# Patient Record
Sex: Male | Born: 1965
Health system: Southern US, Community
[De-identification: ages and names within clinical notes are randomized; demographics above are authoritative.]

## PROBLEM LIST (undated history)

## (undated) DIAGNOSIS — E785 Hyperlipidemia, unspecified: Secondary | ICD-10-CM

## (undated) DIAGNOSIS — F909 Attention-deficit hyperactivity disorder, unspecified type: Secondary | ICD-10-CM

## (undated) DIAGNOSIS — K5790 Diverticulosis of intestine, part unspecified, without perforation or abscess without bleeding: Secondary | ICD-10-CM

## (undated) DIAGNOSIS — K5792 Diverticulitis of intestine, part unspecified, without perforation or abscess without bleeding: Secondary | ICD-10-CM

## (undated) DIAGNOSIS — K635 Polyp of colon: Secondary | ICD-10-CM

## (undated) DIAGNOSIS — F419 Anxiety disorder, unspecified: Secondary | ICD-10-CM

## (undated) DIAGNOSIS — I10 Essential (primary) hypertension: Secondary | ICD-10-CM

## (undated) DIAGNOSIS — Z86010 Personal history of colonic polyps: Secondary | ICD-10-CM

## (undated) DIAGNOSIS — K5732 Diverticulitis of large intestine without perforation or abscess without bleeding: Secondary | ICD-10-CM

## (undated) DIAGNOSIS — K5710 Diverticulosis of small intestine without perforation or abscess without bleeding: Secondary | ICD-10-CM

## (undated) DIAGNOSIS — E119 Type 2 diabetes mellitus without complications: Secondary | ICD-10-CM

## (undated) DIAGNOSIS — K219 Gastro-esophageal reflux disease without esophagitis: Secondary | ICD-10-CM

## (undated) DIAGNOSIS — K5712 Diverticulitis of small intestine without perforation or abscess without bleeding: Secondary | ICD-10-CM

## (undated) DIAGNOSIS — K5730 Diverticulosis of large intestine without perforation or abscess without bleeding: Secondary | ICD-10-CM

## (undated) DIAGNOSIS — F32A Depression, unspecified: Secondary | ICD-10-CM

## (undated) HISTORY — PX: COLON SURGERY: SHX602

## (undated) HISTORY — DX: Gastro-esophageal reflux disease without esophagitis: K21.9

## (undated) HISTORY — PX: EYE SURGERY: SHX253

## (undated) HISTORY — DX: Hyperlipidemia, unspecified: E78.5

## (undated) HISTORY — PX: COLONOSCOPY W/ POLYPECTOMY: SHX1380

## (undated) HISTORY — DX: Anxiety disorder, unspecified: F41.9

## (undated) HISTORY — PX: LASIK: SHX215

## (undated) HISTORY — DX: Polyp of colon: K63.5

## (undated) HISTORY — DX: Type 2 diabetes mellitus without complications: E11.9

## (undated) HISTORY — DX: Diverticulosis of intestine, part unspecified, without perforation or abscess without bleeding: K57.90

## (undated) HISTORY — DX: Diverticulitis of intestine, part unspecified, without perforation or abscess without bleeding: K57.92

## (undated) HISTORY — DX: Attention-deficit hyperactivity disorder, unspecified type: F90.9

## (undated) HISTORY — PX: TONSILLECTOMY: SUR1361

---

## 2000-03-11 DIAGNOSIS — K5790 Diverticulosis of intestine, part unspecified, without perforation or abscess without bleeding: Secondary | ICD-10-CM

## 2000-03-11 HISTORY — DX: Diverticulosis of intestine, part unspecified, without perforation or abscess without bleeding: K57.90

## 2000-03-16 ENCOUNTER — Emergency Department (HOSPITAL_COMMUNITY): Admission: EM | Admit: 2000-03-16 | Discharge: 2000-03-16 | Payer: Self-pay | Admitting: Emergency Medicine

## 2000-12-01 ENCOUNTER — Encounter (INDEPENDENT_AMBULATORY_CARE_PROVIDER_SITE_OTHER): Payer: Self-pay | Admitting: Specialist

## 2000-12-01 ENCOUNTER — Other Ambulatory Visit: Admission: RE | Admit: 2000-12-01 | Discharge: 2000-12-01 | Payer: Self-pay | Admitting: Gastroenterology

## 2002-06-17 ENCOUNTER — Emergency Department (HOSPITAL_COMMUNITY): Admission: EM | Admit: 2002-06-17 | Discharge: 2002-06-17 | Payer: Self-pay | Admitting: Emergency Medicine

## 2002-06-19 ENCOUNTER — Emergency Department (HOSPITAL_COMMUNITY): Admission: EM | Admit: 2002-06-19 | Discharge: 2002-06-19 | Payer: Self-pay | Admitting: Emergency Medicine

## 2003-10-01 ENCOUNTER — Emergency Department (HOSPITAL_COMMUNITY): Admission: EM | Admit: 2003-10-01 | Discharge: 2003-10-01 | Payer: Self-pay | Admitting: Emergency Medicine

## 2004-05-18 ENCOUNTER — Ambulatory Visit: Payer: Self-pay | Admitting: Internal Medicine

## 2004-07-24 ENCOUNTER — Ambulatory Visit: Payer: Self-pay | Admitting: Internal Medicine

## 2004-11-28 ENCOUNTER — Ambulatory Visit: Payer: Self-pay | Admitting: Internal Medicine

## 2005-08-09 ENCOUNTER — Ambulatory Visit: Payer: Self-pay | Admitting: Geriatric Medicine

## 2006-03-11 DIAGNOSIS — K635 Polyp of colon: Secondary | ICD-10-CM

## 2006-03-11 HISTORY — DX: Polyp of colon: K63.5

## 2006-09-17 ENCOUNTER — Telehealth (INDEPENDENT_AMBULATORY_CARE_PROVIDER_SITE_OTHER): Payer: Self-pay | Admitting: *Deleted

## 2006-09-19 ENCOUNTER — Ambulatory Visit: Payer: Self-pay | Admitting: Internal Medicine

## 2006-09-19 DIAGNOSIS — K219 Gastro-esophageal reflux disease without esophagitis: Secondary | ICD-10-CM | POA: Insufficient documentation

## 2006-09-25 ENCOUNTER — Encounter (INDEPENDENT_AMBULATORY_CARE_PROVIDER_SITE_OTHER): Payer: Self-pay | Admitting: *Deleted

## 2006-10-22 ENCOUNTER — Ambulatory Visit: Payer: Self-pay | Admitting: Gastroenterology

## 2006-11-27 ENCOUNTER — Ambulatory Visit: Payer: Self-pay | Admitting: Gastroenterology

## 2006-11-27 ENCOUNTER — Encounter: Payer: Self-pay | Admitting: Internal Medicine

## 2006-11-27 ENCOUNTER — Encounter: Payer: Self-pay | Admitting: Gastroenterology

## 2006-11-27 DIAGNOSIS — D126 Benign neoplasm of colon, unspecified: Secondary | ICD-10-CM

## 2006-11-27 DIAGNOSIS — K648 Other hemorrhoids: Secondary | ICD-10-CM | POA: Insufficient documentation

## 2007-03-10 ENCOUNTER — Telehealth (INDEPENDENT_AMBULATORY_CARE_PROVIDER_SITE_OTHER): Payer: Self-pay | Admitting: *Deleted

## 2007-04-28 ENCOUNTER — Telehealth (INDEPENDENT_AMBULATORY_CARE_PROVIDER_SITE_OTHER): Payer: Self-pay | Admitting: *Deleted

## 2007-06-12 DIAGNOSIS — K5730 Diverticulosis of large intestine without perforation or abscess without bleeding: Secondary | ICD-10-CM | POA: Insufficient documentation

## 2007-06-12 DIAGNOSIS — E785 Hyperlipidemia, unspecified: Secondary | ICD-10-CM

## 2007-06-12 DIAGNOSIS — I10 Essential (primary) hypertension: Secondary | ICD-10-CM

## 2007-07-10 ENCOUNTER — Telehealth (INDEPENDENT_AMBULATORY_CARE_PROVIDER_SITE_OTHER): Payer: Self-pay | Admitting: *Deleted

## 2007-08-13 ENCOUNTER — Telehealth: Payer: Self-pay | Admitting: Internal Medicine

## 2007-08-20 ENCOUNTER — Encounter (INDEPENDENT_AMBULATORY_CARE_PROVIDER_SITE_OTHER): Payer: Self-pay | Admitting: *Deleted

## 2007-10-19 ENCOUNTER — Telehealth (INDEPENDENT_AMBULATORY_CARE_PROVIDER_SITE_OTHER): Payer: Self-pay | Admitting: *Deleted

## 2007-11-20 ENCOUNTER — Ambulatory Visit: Payer: Self-pay | Admitting: Internal Medicine

## 2007-11-20 DIAGNOSIS — Z860100 Personal history of colon polyps, unspecified: Secondary | ICD-10-CM | POA: Insufficient documentation

## 2007-11-20 DIAGNOSIS — F988 Other specified behavioral and emotional disorders with onset usually occurring in childhood and adolescence: Secondary | ICD-10-CM

## 2007-11-20 HISTORY — DX: Personal history of colonic polyps: Z86.010

## 2007-11-20 HISTORY — DX: Personal history of colon polyps, unspecified: Z86.0100

## 2007-11-23 ENCOUNTER — Encounter (INDEPENDENT_AMBULATORY_CARE_PROVIDER_SITE_OTHER): Payer: Self-pay | Admitting: *Deleted

## 2007-11-24 ENCOUNTER — Telehealth (INDEPENDENT_AMBULATORY_CARE_PROVIDER_SITE_OTHER): Payer: Self-pay | Admitting: *Deleted

## 2007-11-25 ENCOUNTER — Ambulatory Visit (HOSPITAL_COMMUNITY): Payer: Self-pay | Admitting: Psychiatry

## 2007-12-18 ENCOUNTER — Ambulatory Visit (HOSPITAL_COMMUNITY): Payer: Self-pay | Admitting: Psychiatry

## 2008-01-15 ENCOUNTER — Ambulatory Visit (HOSPITAL_COMMUNITY): Payer: Self-pay | Admitting: Psychiatry

## 2008-02-01 ENCOUNTER — Ambulatory Visit (HOSPITAL_COMMUNITY): Payer: Self-pay | Admitting: Licensed Clinical Social Worker

## 2008-02-12 ENCOUNTER — Ambulatory Visit (HOSPITAL_COMMUNITY): Payer: Self-pay | Admitting: Psychiatry

## 2008-02-29 ENCOUNTER — Telehealth (INDEPENDENT_AMBULATORY_CARE_PROVIDER_SITE_OTHER): Payer: Self-pay | Admitting: *Deleted

## 2008-03-15 ENCOUNTER — Ambulatory Visit (HOSPITAL_COMMUNITY): Payer: Self-pay | Admitting: Licensed Clinical Social Worker

## 2008-04-11 ENCOUNTER — Ambulatory Visit (HOSPITAL_COMMUNITY): Payer: Self-pay | Admitting: Psychiatry

## 2008-05-13 ENCOUNTER — Telehealth (INDEPENDENT_AMBULATORY_CARE_PROVIDER_SITE_OTHER): Payer: Self-pay | Admitting: *Deleted

## 2008-05-17 ENCOUNTER — Ambulatory Visit: Payer: Self-pay | Admitting: Internal Medicine

## 2008-05-26 ENCOUNTER — Encounter (INDEPENDENT_AMBULATORY_CARE_PROVIDER_SITE_OTHER): Payer: Self-pay | Admitting: *Deleted

## 2008-06-02 ENCOUNTER — Ambulatory Visit: Payer: Self-pay | Admitting: Internal Medicine

## 2008-06-02 LAB — CONVERTED CEMR LAB: HDL goal, serum: 40 mg/dL

## 2008-06-03 ENCOUNTER — Telehealth (INDEPENDENT_AMBULATORY_CARE_PROVIDER_SITE_OTHER): Payer: Self-pay | Admitting: *Deleted

## 2008-06-17 ENCOUNTER — Ambulatory Visit (HOSPITAL_COMMUNITY): Payer: Self-pay | Admitting: Psychiatry

## 2008-06-23 ENCOUNTER — Telehealth: Payer: Self-pay | Admitting: Internal Medicine

## 2008-06-24 ENCOUNTER — Ambulatory Visit: Payer: Self-pay | Admitting: Internal Medicine

## 2008-06-24 ENCOUNTER — Telehealth: Payer: Self-pay | Admitting: Internal Medicine

## 2008-06-24 DIAGNOSIS — K5732 Diverticulitis of large intestine without perforation or abscess without bleeding: Secondary | ICD-10-CM

## 2008-06-24 HISTORY — DX: Diverticulitis of large intestine without perforation or abscess without bleeding: K57.32

## 2008-06-24 LAB — CONVERTED CEMR LAB
Bilirubin Urine: NEGATIVE
Blood in Urine, dipstick: NEGATIVE
Protein, U semiquant: NEGATIVE
Urobilinogen, UA: NEGATIVE
WBC Urine, dipstick: NEGATIVE

## 2008-06-26 LAB — CONVERTED CEMR LAB
Basophils Absolute: 0 10*3/uL (ref 0.0–0.1)
Basophils Relative: 0.2 % (ref 0.0–3.0)
Eosinophils Absolute: 0.1 10*3/uL (ref 0.0–0.7)
HCT: 43.6 % (ref 39.0–52.0)
Hemoglobin: 15.2 g/dL (ref 13.0–17.0)
Lymphocytes Relative: 26.9 % (ref 12.0–46.0)
MCV: 88.4 fL (ref 78.0–100.0)
Monocytes Absolute: 1.4 10*3/uL — ABNORMAL HIGH (ref 0.1–1.0)
Monocytes Relative: 10.8 % (ref 3.0–12.0)

## 2008-06-27 ENCOUNTER — Encounter (INDEPENDENT_AMBULATORY_CARE_PROVIDER_SITE_OTHER): Payer: Self-pay | Admitting: *Deleted

## 2008-08-26 ENCOUNTER — Ambulatory Visit (HOSPITAL_COMMUNITY): Payer: Self-pay | Admitting: Psychiatry

## 2008-09-19 ENCOUNTER — Ambulatory Visit: Payer: Self-pay | Admitting: Internal Medicine

## 2008-09-25 LAB — CONVERTED CEMR LAB
AST: 22 units/L (ref 0–37)
Albumin: 4.6 g/dL (ref 3.5–5.2)
Cholesterol: 237 mg/dL — ABNORMAL HIGH (ref 0–200)
Direct LDL: 182.7 mg/dL
Total Bilirubin: 0.7 mg/dL (ref 0.3–1.2)
Total Protein: 7.6 g/dL (ref 6.0–8.3)
VLDL: 33.4 mg/dL (ref 0.0–40.0)

## 2008-09-27 ENCOUNTER — Encounter (INDEPENDENT_AMBULATORY_CARE_PROVIDER_SITE_OTHER): Payer: Self-pay | Admitting: *Deleted

## 2008-10-12 ENCOUNTER — Ambulatory Visit: Payer: Self-pay | Admitting: Internal Medicine

## 2008-10-28 ENCOUNTER — Ambulatory Visit (HOSPITAL_COMMUNITY): Payer: Self-pay | Admitting: Psychiatry

## 2008-12-28 ENCOUNTER — Ambulatory Visit (HOSPITAL_COMMUNITY): Payer: Self-pay | Admitting: Psychiatry

## 2009-02-20 ENCOUNTER — Ambulatory Visit (HOSPITAL_COMMUNITY): Payer: Self-pay | Admitting: Psychiatry

## 2009-03-13 ENCOUNTER — Telehealth (INDEPENDENT_AMBULATORY_CARE_PROVIDER_SITE_OTHER): Payer: Self-pay | Admitting: *Deleted

## 2009-03-13 ENCOUNTER — Encounter (INDEPENDENT_AMBULATORY_CARE_PROVIDER_SITE_OTHER): Payer: Self-pay | Admitting: *Deleted

## 2009-03-24 ENCOUNTER — Telehealth (INDEPENDENT_AMBULATORY_CARE_PROVIDER_SITE_OTHER): Payer: Self-pay | Admitting: *Deleted

## 2009-04-12 ENCOUNTER — Ambulatory Visit: Payer: Self-pay | Admitting: Internal Medicine

## 2009-04-12 DIAGNOSIS — F411 Generalized anxiety disorder: Secondary | ICD-10-CM | POA: Insufficient documentation

## 2009-05-15 ENCOUNTER — Ambulatory Visit (HOSPITAL_COMMUNITY): Payer: Self-pay | Admitting: Psychiatry

## 2009-06-05 ENCOUNTER — Ambulatory Visit: Payer: Self-pay | Admitting: Internal Medicine

## 2009-06-05 DIAGNOSIS — J069 Acute upper respiratory infection, unspecified: Secondary | ICD-10-CM | POA: Insufficient documentation

## 2009-06-05 LAB — CONVERTED CEMR LAB: Rapid Strep: NEGATIVE

## 2009-08-04 ENCOUNTER — Telehealth (INDEPENDENT_AMBULATORY_CARE_PROVIDER_SITE_OTHER): Payer: Self-pay | Admitting: *Deleted

## 2009-08-14 ENCOUNTER — Ambulatory Visit (HOSPITAL_COMMUNITY): Payer: Self-pay | Admitting: Psychiatry

## 2009-11-20 ENCOUNTER — Ambulatory Visit (HOSPITAL_COMMUNITY): Payer: Self-pay | Admitting: Psychiatry

## 2010-02-12 ENCOUNTER — Ambulatory Visit (HOSPITAL_COMMUNITY): Payer: Self-pay | Admitting: Psychiatry

## 2010-04-08 LAB — CONVERTED CEMR LAB
ALT: 46 units/L (ref 0–53)
AST: 22 units/L (ref 0–37)
AST: 30 units/L (ref 0–37)
Albumin: 4.7 g/dL (ref 3.5–5.2)
Alkaline Phosphatase: 67 units/L (ref 39–117)
Alkaline Phosphatase: 86 units/L (ref 39–117)
BUN: 12 mg/dL (ref 6–23)
Basophils Absolute: 0 10*3/uL (ref 0.0–0.1)
Basophils Absolute: 0 10*3/uL (ref 0.0–0.1)
Basophils Relative: 0 % (ref 0.0–1.0)
Basophils Relative: 0.1 % (ref 0.0–3.0)
Basophils Relative: 0.1 % (ref 0.0–3.0)
Bilirubin Urine: NEGATIVE
Bilirubin, Direct: 0.1 mg/dL (ref 0.0–0.3)
Blood in Urine, dipstick: NEGATIVE
CO2: 28 meq/L (ref 19–32)
CO2: 30 meq/L (ref 19–32)
CO2: 30 meq/L (ref 19–32)
Calcium: 10.1 mg/dL (ref 8.4–10.5)
Chloride: 102 meq/L (ref 96–112)
Chloride: 102 meq/L (ref 96–112)
Chloride: 105 meq/L (ref 96–112)
Cholesterol: 191 mg/dL (ref 0–200)
Cholesterol: 225 mg/dL (ref 0–200)
Creatinine, Ser: 0.9 mg/dL (ref 0.4–1.5)
Creatinine, Ser: 1.1 mg/dL (ref 0.4–1.5)
Direct LDL: 146.6 mg/dL
Eosinophils Absolute: 0.2 10*3/uL (ref 0.0–0.7)
Eosinophils Absolute: 0.3 10*3/uL (ref 0.0–0.6)
GFR calc non Af Amer: 99 mL/min
Glucose, Bld: 88 mg/dL (ref 70–99)
Glucose, Bld: 88 mg/dL (ref 70–99)
Glucose, Urine, Semiquant: NEGATIVE
HCT: 47.4 % (ref 39.0–52.0)
HCT: 49 % (ref 39.0–52.0)
HDL: 35.3 mg/dL — ABNORMAL LOW (ref >39.0)
HDL: 41.8 mg/dL (ref >39.00)
Hemoglobin, Urine: NEGATIVE
Hemoglobin: 15.5 g/dL (ref 13.0–17.0)
Hemoglobin: 16.2 g/dL (ref 13.0–17.0)
Hgb A1c MFr Bld: 5.6 % (ref 4.6–6.0)
Hgb A1c MFr Bld: 5.9 % (ref 4.6–6.0)
LDL Goal: 100 mg/dL
Lymphocytes Relative: 36.1 % (ref 12.0–46.0)
Lymphs Abs: 3.1 10*3/uL (ref 0.7–4.0)
MCHC: 32.8 g/dL (ref 30.0–36.0)
MCHC: 34.9 g/dL (ref 30.0–36.0)
Monocytes Absolute: 0.8 10*3/uL — ABNORMAL HIGH (ref 0.2–0.7)
Monocytes Relative: 8.8 % (ref 3.0–12.0)
Neutro Abs: 3.8 10*3/uL (ref 1.4–7.7)
Neutro Abs: 5.1 10*3/uL (ref 1.4–7.7)
Neutrophils Relative %: 58.7 % (ref 43.0–77.0)
Nitrite: NEGATIVE
Nitrite: NEGATIVE
Platelets: 189 10*3/uL (ref 150–400)
Platelets: 203 10*3/uL (ref 150.0–400.0)
Protein, U semiquant: NEGATIVE
Protein, ur: NEGATIVE mg/dL
RBC: 5.26 M/uL (ref 4.22–5.81)
RDW: 12 % (ref 11.5–14.6)
Sodium: 141 meq/L (ref 135–145)
Specific Gravity, Urine: 1.02
TSH: 1.54 microintl units/mL (ref 0.35–5.50)
TSH: 1.85 microintl units/mL (ref 0.35–5.50)
TSH: 1.87 microintl units/mL (ref 0.35–5.50)
Total Bilirubin: 0.7 mg/dL (ref 0.3–1.2)
Total Bilirubin: 0.9 mg/dL (ref 0.3–1.2)
Total Bilirubin: 0.9 mg/dL (ref 0.3–1.2)
Total Bilirubin: 1.1 mg/dL (ref 0.3–1.2)
Total CHOL/HDL Ratio: 5
Total CHOL/HDL Ratio: 6.4
Total CHOL/HDL Ratio: 8.2
Total Protein: 7.3 g/dL (ref 6.0–8.3)
Total Protein: 7.4 g/dL (ref 6.0–8.3)
Total Protein: 7.4 g/dL (ref 6.0–8.3)
Total Protein: 8.4 g/dL — ABNORMAL HIGH (ref 6.0–8.3)
Triglycerides: 312 mg/dL (ref 0–149)
Urobilinogen, UA: 0.2 (ref 0.0–1.0)
VLDL: 62 mg/dL — ABNORMAL HIGH (ref 0–40)
WBC Urine, dipstick: NEGATIVE
WBC: 8.7 10*3/uL (ref 4.5–10.5)
WBC: 9.1 10*3/uL (ref 4.5–10.5)
WBC: 9.5 10*3/uL (ref 4.5–10.5)
pH: 6 (ref 5.0–8.0)
pH: 6.5

## 2010-04-10 NOTE — Progress Notes (Signed)
Summary: refill  Phone Note Refill Request Message from:  Fax from Pharmacy on Aug 04, 2009 10:14 AM  Refills Requested: Medication #1:  CRESTOR 20 MG TABS daily as directed  Medication #2:  AMLODIPINE BESYLATE 5 MG TABS 1 once daily  Medication #3:  ALTACE 10 MG  CAPS 1 by mouth qd  Medication #4:  WELLBUTRIN XL 300 MG  TB24 1 by mouth qd [facility] - fax [phone] -- ph [phone]   Method Requested: Fax to Mail Away Pharmacy Initial call taken by: Okey Regal [place],  Aug 04, 2009 10:39 AM    Prescriptions: AMLODIPINE BESYLATE 5 MG TABS (AMLODIPINE BESYLATE) 1 once daily  #90 x 3   Entered by:   Shonna Chock   Authorized by:   Marga Melnick MD   Signed by:   Shonna Chock on 08/04/2009   Method used:   Faxed to ...       [facility] (mail-order)             ,          Ph: [phone]       Fax: [phone]   RxID:   [id number] CRESTOR 20 MG TABS (ROSUVASTATIN CALCIUM) daily as directed  #90 x 0   Entered by:   Shonna Chock   Authorized by:   Marga Melnick MD   Signed by:   Shonna Chock on 08/04/2009   Method used:   Faxed to ...       [facility]* (mail-order)             ,          Ph: [phone]       Fax: [phone]   RxID:   [id number] ALTACE 10 MG  CAPS (RAMIPRIL) 1 by mouth qd  #90 x 3   Entered by:   Shonna Chock   Authorized by:   Marga Melnick MD   Signed by:   Shonna Chock on 08/04/2009   Method used:   Faxed to ...       [facility]* (mail-order)             ,          Ph: [phone]       Fax: [phone]   RxID:   [id number] WELLBUTRIN XL 300 MG  TB24 (BUPROPION HCL) 1 by mouth qd  #90 x 3   Entered by:   Shonna Chock   Authorized by:   Marga Melnick MD   Signed by:   Shonna Chock on 08/04/2009   Method used:   Faxed to ...       [facility]* (mail-order)             ,          Ph: [phone]       Fax: [phone]   RxID:   [id number]

## 2010-04-10 NOTE — Progress Notes (Signed)
Summary: REFILL  Phone Note Refill Request Message from:  Fax from Pharmacy on [facility] FAX [phone]  Refills Requested: Medication #1:  CRESTOR 20 MG TABS daily as directed. OMEPRAZOLE 20MG   Initial call taken by: Barb Merino,  March 13, 2009 11:52 AM    Prescriptions: PRILOSEC 20 MG  CPDR (OMEPRAZOLE) 1 by mouth qd  #90 x 2   Entered by:   Shonna Chock   Authorized by:   Marga Melnick MD   Signed by:   Shonna Chock on 03/13/2009   Method used:   Faxed to ...       [facility]* (mail-order)             ,          Ph: [phone]       Fax: [phone]   RxID:   [id number]

## 2010-04-10 NOTE — Assessment & Plan Note (Signed)
Summary: cpx/ns/kdc   Vital Signs:  Patient profile:   45 year old male Height:      72.5 inches Weight:      231.2 pounds BMI:     31.04 Temp:     98.6 degrees F oral Pulse rate:   85 / minute Resp:     14 per minute BP sitting:   148 / 80  (left arm) Cuff size:   large  Vitals Entered By: Shonna Chock (April 12, 2009 11:05 AM)  Comments REVIEWED MED LIST, PATIENT AGREED DOSE AND INSTRUCTION CORRECT  Crestor is 1 by mouth M/W/F Flu Vaccine Consent Questions     Do you have a history of severe allergic reactions to this vaccine? no    Any prior history of allergic reactions to egg and/or gelatin? no    Do you have a sensitivity to the preservative Thimersol? no    Do you have a past history of Guillan-Barre Syndrome? no    Do you currently have an acute febrile illness? no    Have you ever had a severe reaction to latex? no    Vaccine information given and explained to patient? yes    Are you currently pregnant? no    Lot Number:[id number]   Exp Date:09/07/2009   Site Given:Right Deltoid IM    History of Present Illness: Dakota Vang is here for a physical; he is asymptomatic.  Lipid Management History:      Positive NCEP/ATP III risk factors include HDL cholesterol less than 40 and hypertension.  Negative NCEP/ATP III risk factors include male age less than 105 years old, non-diabetic, no family history for ischemic heart disease, non-tobacco-user status, no ASHD (atherosclerotic heart disease), no prior stroke/TIA, no peripheral vascular disease, and no history of aortic aneurysm.     Preventive Screening-Counseling & Management  Caffeine-Diet-Exercise     Does Patient Exercise: no  Allergies: 1)  ! Pcn  Past History:  Past Medical History: G E R D (ICD-530.81) Colonic polyps, hx of Diverticulosis, colon Hyperlipidemia:NMR 2010 : LDL 154(3088/2974), TG 205, HDL 32 Diverticulitis, PMH  of, 2005 ADD, Anxiety, Dr Minta Balsam, [facility]  Past Surgical  History: Tonsillectomy Lasik bilaterally  2008 Colonoscopy 2002: tics & hemorrhoids; Colon polypectomy X2, last 2008, Dr Russella Dar, due 2013  Family History: Father: colon CA, lung  CA , & mesothelioma  Mother: CAD, CABG X8 & angio 3-4X;  died of CA Gallbladder Siblings: neg; MGM COAD; MGF MI in late 13s; PGF d MI @ 82  Social History: Occupation:Construction Production designer, theatre/television/film Married Alcohol use-yes, minimally Former Smoker: quit 2007 Regular exercise-no No diet  Review of Systems  The patient denies anorexia, fever, vision loss, decreased hearing, hoarseness, chest pain, syncope, dyspnea on exertion, peripheral edema, prolonged cough, headaches, hemoptysis, abdominal pain, melena, hematochezia, severe indigestion/heartburn, hematuria, suspicious skin lesions, depression, unusual weight change, abnormal bleeding, enlarged lymph nodes, and angioedema.         Weight down with decreased portions.  Physical Exam  General:  well-nourished,in no acute distress; alert,appropriate and cooperative throughout examination Head:  Normocephalic and atraumatic without obvious abnormalities. No apparent alopecia; goatee & moustache Eyes:  No corneal or conjunctival inflammation noted. Perrla. Funduscopic exam benign, without hemorrhages, exudates or papilledema.  Ears:  External ear exam shows no significant lesions or deformities.  Otoscopic examination reveals clear canals, tympanic membranes are intact bilaterally without bulging, retraction, inflammation or discharge. Hearing is grossly normal bilaterally. Nose:  External nasal examination shows no deformity or inflammation. Nasal mucosa  are pink and moist without lesions or exudates. Minimal septal dislocation Mouth:  Oral mucosa and oropharynx without lesions or exudates.  Teeth in good repair. Neck:  No deformities, masses, or tenderness noted. Chest Wall:  Prominent xiphoid Lungs:  Normal respiratory effort, chest expands symmetrically. Lungs are  clear to auscultation, no crackles or wheezes. Heart:  Normal rate and regular rhythm. S1 and S2 normal without gallop, murmur, click, rub . S4 Abdomen:  Bowel sounds positive,abdomen soft and non-tender without masses, organomegaly or hernias noted. Rectal:  No external abnormalities noted. Normal sphincter tone. No rectal masses or tenderness. Genitalia:  Testes bilaterally descended without nodularity, tenderness or masses. No scrotal masses or lesions. No penis lesions or urethral discharge. L varicocele.   Prostate:  Prostate gland firm and smooth, no enlargement, nodularity, tenderness, mass, asymmetry or induration. Msk:  No deformity or scoliosis noted of thoracic or lumbar spine.   Pulses:  R and L carotid,radial,dorsalis pedis and posterior tibial pulses are full and equal bilaterally Extremities:  No clubbing, cyanosis, edema, or deformity noted with normal full range of motion of all joints.   Neurologic:  alert & oriented X3 and DTRs symmetrical and normal except decreased L knee   Skin:  Intact without suspicious lesions or rashes Cervical Nodes:  No lymphadenopathy noted Axillary Nodes:  No palpable lymphadenopathy Inguinal Nodes:  No significant adenopathy Psych:  memory intact for recent and remote, normally interactive, and good eye contact.     Impression & Recommendations:  Problem # 1:  PREVENTIVE HEALTH CARE (ICD-V70.0)  Orders: EKG w/ Interpretation (93000) Venipuncture (40102) TLB-Lipid Panel (80061-LIPID) TLB-BMP (Basic Metabolic Panel-BMET) (80048-METABOL) TLB-CBC Platelet - w/Differential (85025-CBCD) TLB-Hepatic/Liver Function Pnl (80076-HEPATIC) TLB-TSH (Thyroid Stimulating Hormone) (84443-TSH)  Problem # 2:  HYPERLIPIDEMIA (ICD-272.4)  His updated medication list for this problem includes:    Crestor 20 Mg Tabs (Rosuvastatin calcium) .Marland Kitchen... Daily as directed  Orders: Venipuncture (72536) TLB-Lipid Panel (80061-LIPID)  Problem # 3:  COLONIC POLYPS,  HX OF (ICD-V12.72) due 2013  Problem # 4:  HYPERTENSION (ICD-401.9)  His updated medication list for this problem includes:    Altace 10 Mg Caps (Ramipril) .Marland Kitchen... 1 by mouth qd    Amlodipine Besylate 5 Mg Tabs (Amlodipine besylate) .Marland Kitchen... 1 once daily  Orders: EKG w/ Interpretation (93000) Venipuncture (64403)  Problem # 5:  ADD (ICD-314.00) as per Dr Minta Balsam  Problem # 6:  DIVERTICULOSIS, COLON (ICD-562.10)  Complete Medication List: 1)  Altace 10 Mg Caps (Ramipril) .Marland Kitchen.. 1 by mouth qd 2)  Prilosec 20 Mg Cpdr (Omeprazole) .Marland Kitchen.. 1 by mouth qd 3)  Wellbutrin Xl 300 Mg Tb24 (Bupropion hcl) .Marland Kitchen.. 1 by mouth qd 4)  Fluticasone Propionate 50 Mcg/act Susp (Fluticasone propionate) .Marland Kitchen.. 1-2 sprays each nostril daily-seasonal 5)  Amlodipine Besylate 5 Mg Tabs (Amlodipine besylate) .Marland Kitchen.. 1 once daily 6)  Lamictal 200 Mg Tabs (Lamotrigine) .Marland Kitchen.. 1 by mouth once daily 7)  Ritalin 10 Mg Tabs (Methylphenidate hcl) .Marland Kitchen.. 1 by mouth two times a day 8)  Crestor 20 Mg Tabs (Rosuvastatin calcium) .... Daily as directed  Other Orders: Admin 1st Vaccine (47425) Flu Vaccine 49yrs + (95638)  Lipid Assessment/Plan:      Based on NCEP/ATP III, the patient's risk factor category is "2 or more risk factors and a calculated 10 year CAD risk of > 20%".  The patient's lipid goals are as follows: Total cholesterol goal is 200; LDL cholesterol goal is 100; HDL cholesterol goal is 40; Triglyceride goal is 150.  His  LDL cholesterol goal has not been met.  Secondary causes for hyperlipidemia have been ruled out.  He has been counseled on adjunctive measures for lowering his cholesterol and has been provided with dietary instructions.    Patient Instructions: 1)  Check your Blood Pressure regularly. If it is above:140/90 ON AVERAGE( ideal is average < 135/85) you should make an appointment. Prescriptions: CRESTOR 20 MG TABS (ROSUVASTATIN CALCIUM) daily as directed  #90 x 1   Entered and Authorized by:   Marga Melnick MD    Signed by:   Marga Melnick MD on 04/12/2009   Method used:   Print then Give to Patient   RxID:   [id number] FLUTICASONE PROPIONATE 50 MCG/ACT  SUSP (FLUTICASONE PROPIONATE) 1-2 sprays each nostril daily-SEASONAL  #3 x 3   Entered and Authorized by:   Marga Melnick MD   Signed by:   Marga Melnick MD on 04/12/2009   Method used:   Print then Give to Patient   RxID:   [id number] AMLODIPINE BESYLATE 5 MG TABS (AMLODIPINE BESYLATE) 1 once daily  #90 x 3   Entered and Authorized by:   Marga Melnick MD   Signed by:   Marga Melnick MD on 04/12/2009   Method used:   Print then Give to Patient   RxID:   [id number] WELLBUTRIN XL 300 MG  TB24 (BUPROPION HCL) 1 by mouth qd  #90 x 3   Entered and Authorized by:   Marga Melnick MD   Signed by:   Marga Melnick MD on 04/12/2009   Method used:   Print then Give to Patient   RxID:   [id number] PRILOSEC 20 MG  CPDR (OMEPRAZOLE) 1 by mouth qd  #90 x 3   Entered and Authorized by:   Marga Melnick MD   Signed by:   Marga Melnick MD on 04/12/2009   Method used:   Print then Give to Patient   RxID:   [id number] ALTACE 10 MG  CAPS (RAMIPRIL) 1 by mouth qd  #90 x 3   Entered and Authorized by:   Marga Melnick MD   Signed by:   Marga Melnick MD on 04/12/2009   Method used:   Print then Give to Patient   RxID:   [id number]

## 2010-04-10 NOTE — Assessment & Plan Note (Signed)
Summary: STREP THROAT/KDC   Vital Signs:  Patient profile:   45 year old male Weight:      231 pounds Temp:     100.3 degrees F oral Pulse rate:   80 / minute Resp:     15 per minute BP sitting:   130 / 90  (left arm)  Vitals Entered By: Jeremy Johann CMA (June 05, 2009 12:37 PM) CC: sore throat Comments REVIEWED MED LIST, PATIENT AGREED DOSE AND INSTRUCTION CORRECT    CC:  sore throat.  History of Present Illness: Onset as ST on 06/02/2009; 2 of 3 sons tested + for  Strep last week. Cough with yellow sputum & yellow D/C X 2 days. Rx: Advil for temp to 101.1.  Allergies: 1)  ! Pcn  Review of Systems General:  Complains of chills, fever, and sweats. ENT:  Complains of nasal congestion, sinus pressure, and sore throat; No frontal headache or facial pain. Mild L earache. Resp:  Complains of cough and sputum productive; denies chest pain with inspiration, coughing up blood, shortness of breath, and wheezing.  Physical Exam  General:  in no acute distress; alert,appropriate and cooperative throughout examination Ears:  External ear exam shows no significant lesions or deformities.  Otoscopic examination reveals clear canals, tympanic membranes are intact bilaterally without bulging, retraction, inflammation or discharge. Hearing is grossly normal bilaterally. Nose:  External nasal examination shows no deformity or inflammation. Nasal mucosa are erythematous without lesions or exudates. Mouth:  Oral mucosa and oropharynx without lesions or exudates.  No pharyngeal erythema.   Lungs:  Normal respiratory effort, chest expands symmetrically. Lungs are clear to auscultation, no crackles or wheezes. Cervical Nodes:  No lymphadenopathy noted Axillary Nodes:  No palpable lymphadenopathy   Impression & Recommendations:  Problem # 1:  PHARYNGITIS-ACUTE (ICD-462)  Beta Strep neg  Orders: Rapid Strep ([address])  His updated medication list for this problem includes:    Azithromycin  250 Mg Tabs (Azithromycin) .Marland Kitchen... As per pack  Problem # 2:  BRONCHITIS-ACUTE (ICD-466.0)  His updated medication list for this problem includes:    Azithromycin 250 Mg Tabs (Azithromycin) .Marland Kitchen... As per pack    Benzonatate 200 Mg Caps (Benzonatate) .Marland Kitchen... 1 evey 6-8 hrs as needed for  cough  Problem # 3:  URI (ICD-465.9)  His updated medication list for this problem includes:    Benzonatate 200 Mg Caps (Benzonatate) .Marland Kitchen... 1 evey 6-8 hrs as needed for  cough  Complete Medication List: 1)  Altace 10 Mg Caps (Ramipril) .Marland Kitchen.. 1 by mouth qd 2)  Prilosec 20 Mg Cpdr (Omeprazole) .Marland Kitchen.. 1 by mouth qd 3)  Wellbutrin Xl 300 Mg Tb24 (Bupropion hcl) .Marland Kitchen.. 1 by mouth qd 4)  Fluticasone Propionate 50 Mcg/act Susp (Fluticasone propionate) .Marland Kitchen.. 1-2 sprays each nostril daily-seasonal 5)  Amlodipine Besylate 5 Mg Tabs (Amlodipine besylate) .Marland Kitchen.. 1 once daily 6)  Lamictal 200 Mg Tabs (Lamotrigine) .Marland Kitchen.. 1 by mouth once daily 7)  Ritalin 10 Mg Tabs (Methylphenidate hcl) .Marland Kitchen.. 1 by mouth two times a day 8)  Crestor 20 Mg Tabs (Rosuvastatin calcium) .... Daily as directed 9)  Azithromycin 250 Mg Tabs (Azithromycin) .... As per pack 10)  Benzonatate 200 Mg Caps (Benzonatate) .Marland Kitchen.. 1 evey 6-8 hrs as needed for  cough  Patient Instructions: 1)  Drink as much fluid as you can tolerate for the next few days. 2)  Take 650-1000mg  of Tylenol every 4-6 hours as needed for relief of pain or comfort of fever AVOID taking more than  4000mg   in a 24 hour period (can cause liver damage in higher doses) OR take 400-600mg  of Ibuprofen (Advil, Motrin) with food every 4-6 hours as needed for relief of pain or comfort of fever. Prescriptions: BENZONATATE 200 MG CAPS (BENZONATATE) 1 evey 6-8 hrs as needed for  cough  #21 x 0   Entered and Authorized by:   Marga Melnick MD   Signed by:   Marga Melnick MD on 06/05/2009   Method used:   Faxed to ...       [facility]  [phone]* (retail)       [address] [place], [place]  [address]       Ph: [phone] or [phone]       Fax: [phone]   RxID:   [id number] AZITHROMYCIN 250 MG TABS (AZITHROMYCIN) as per pack  #1 x 0   Entered and Authorized by:   Marga Melnick MD   Signed by:   Marga Melnick MD on 06/05/2009   Method used:   Faxed to ...       [facility]  [phone]* (retail)       [address] [place], [place]  [address]       Ph: [phone] or [phone]       Fax: [phone]   RxID:   [id number]   Laboratory Results   Date/Time Reported: June 05, 2009 12:50 PM   Other Tests  Rapid Strep: negative

## 2010-04-10 NOTE — Progress Notes (Signed)
Summary: Refill Request  Phone Note Refill Request Message from:  Patient  Refills Requested: Medication #1:  ALTACE 10 MG  CAPS 1 by mouth qd  Medication #2:  PRILOSEC 20 MG  CPDR 1 by mouth qd  Medication #3:  WELLBUTRIN XL 300 MG  TB24 1 by mouth qd  Medication #4:  FLUTICASONE PROPIONATE 50 MCG/ACT  SUSP 1-2 sprays each nostril daily-SEASONAL Amlodipine Crestor   Method Requested: Fax to [facility] Initial call taken by: Shonna Chock,  March 24, 2009 11:42 AM    Prescriptions: CRESTOR 20 MG TABS (ROSUVASTATIN CALCIUM) daily as directed  #90 x 0   Entered by:   Shonna Chock   Authorized by:   Marga Melnick MD   Signed by:   Shonna Chock on 03/24/2009   Method used:   Faxed to ...       [facility]* (mail-order)             ,          Ph: [phone]       Fax: [phone]   RxID:   [id number] AMLODIPINE BESYLATE 5 MG TABS (AMLODIPINE BESYLATE) 1 once daily  #90 x 0   Entered by:   Shonna Chock   Authorized by:   Marga Melnick MD   Signed by:   Shonna Chock on 03/24/2009   Method used:   Faxed to ...       [facility]* (mail-order)             ,          Ph: [phone]       Fax: [phone]   RxID:   [id number] FLUTICASONE PROPIONATE 50 MCG/ACT  SUSP (FLUTICASONE PROPIONATE) 1-2 sprays each nostril daily-SEASONAL  #3 x 0   Entered by:   Shonna Chock   Authorized by:   Marga Melnick MD   Signed by:   Shonna Chock on 03/24/2009   Method used:   Faxed to ...       [facility]* (mail-order)             ,          Ph: [phone]       Fax: [phone]   RxID:   [id number] WELLBUTRIN XL 300 MG  TB24 (BUPROPION HCL) 1 by mouth qd  #90 x 0   Entered by:   Shonna Chock   Authorized by:   Marga Melnick MD   Signed by:   Shonna Chock on 03/24/2009   Method used:   Faxed to ...       [facility]* (mail-order)             ,          Ph: [phone]       Fax: [phone]   RxID:   [id number] PRILOSEC 20 MG   CPDR (OMEPRAZOLE) 1 by mouth qd  #90 x 0   Entered by:   Shonna Chock   Authorized by:   Marga Melnick MD   Signed by:   Shonna Chock on 03/24/2009   Method used:   Faxed to ...       [facility]* (mail-order)             ,          Ph: [phone]       Fax: [phone]   RxID:   [id number] ALTACE 10 MG  CAPS (RAMIPRIL) 1 by mouth  qd  #90 x 0   Entered by:   Shonna Chock   Authorized by:   Marga Melnick MD   Signed by:   Shonna Chock on 03/24/2009   Method used:   Faxed to ...       [facility] (mail-order)             ,          Ph: [phone]       Fax: [phone]   RxID:   [id number]

## 2010-04-10 NOTE — Letter (Signed)
Summary: Primary Care Appointment Letter  [facility]  [address] [place], [place] [address]   Phone: [phone]  Fax: [phone]    03/13/2009 MRN: [id number]  Dakota Vang [address] [place], [place]  [address]  Dear Dakota Vang,   Your Primary Care Physician Marga Melnick MD has indicated that:    ___X___it is time to schedule an appointment-Appointment was due in 01/2009 based on instructions copied and pasted from last ov in 10/2008: Patient Instructions: 1)  Crestor 20 mg M,W & F; fasting labs after 10 weeks. Your LDL goal = < 100 ,ideally < 75. 2)  Please schedule a follow-up appointment in 3 months. 3)  Hepatic Panel prior to visit, ICD-9:995.20 4)  Lipid Panel prior to visit, ICD-9:272.2.    _______you missed your appointment on______ and need to call and          reschedule.    _______you need to have lab work done.    _______you need to schedule an appointment discuss lab or test results.    _______you need to call to reschedule your appointment that is                       scheduled on _________.     Please call our office as soon as possible. Our phone number is [phone]. Please press option 1. Our office is open 8a-12noon and 1p-5p, Monday through Friday.     Thank you,    [facility]

## 2010-04-17 ENCOUNTER — Telehealth (INDEPENDENT_AMBULATORY_CARE_PROVIDER_SITE_OTHER): Payer: Self-pay | Admitting: *Deleted

## 2010-04-23 ENCOUNTER — Encounter (INDEPENDENT_AMBULATORY_CARE_PROVIDER_SITE_OTHER): Payer: Self-pay | Admitting: *Deleted

## 2010-04-24 ENCOUNTER — Emergency Department (HOSPITAL_COMMUNITY)
Admission: EM | Admit: 2010-04-24 | Discharge: 2010-04-24 | Disposition: A | Discharge status: Home / Self Care | Payer: 59 | Attending: Emergency Medicine | Admitting: Emergency Medicine

## 2010-04-24 ENCOUNTER — Emergency Department (HOSPITAL_COMMUNITY): Payer: 59

## 2010-04-24 DIAGNOSIS — R110 Nausea: Secondary | ICD-10-CM | POA: Insufficient documentation

## 2010-04-24 DIAGNOSIS — E7800 Pure hypercholesterolemia, unspecified: Secondary | ICD-10-CM | POA: Insufficient documentation

## 2010-04-24 DIAGNOSIS — I10 Essential (primary) hypertension: Secondary | ICD-10-CM | POA: Insufficient documentation

## 2010-04-24 DIAGNOSIS — K5732 Diverticulitis of large intestine without perforation or abscess without bleeding: Secondary | ICD-10-CM | POA: Insufficient documentation

## 2010-04-24 DIAGNOSIS — R1032 Left lower quadrant pain: Secondary | ICD-10-CM | POA: Insufficient documentation

## 2010-04-24 LAB — CBC
HCT: 44.4 % (ref 39.0–52.0)
Hemoglobin: 16 g/dL (ref 13.0–17.0)
RDW: 12.5 % (ref 11.5–15.5)
WBC: 17.7 10*3/uL — ABNORMAL HIGH (ref 4.0–10.5)

## 2010-04-24 LAB — POCT I-STAT, CHEM 8
Calcium, Ion: 1.19 mmol/L (ref 1.12–1.32)
Creatinine, Ser: 1.2 mg/dL (ref 0.4–1.5)
HCT: 48 % (ref 39.0–52.0)
Potassium: 4.8 mEq/L (ref 3.5–5.1)

## 2010-04-24 LAB — URINALYSIS, ROUTINE W REFLEX MICROSCOPIC
Nitrite: NEGATIVE
Protein, ur: NEGATIVE mg/dL
Specific Gravity, Urine: 1.03 (ref 1.005–1.030)
Urine Glucose, Fasting: NEGATIVE mg/dL
pH: 5.5 (ref 5.0–8.0)

## 2010-04-24 LAB — OCCULT BLOOD, POC DEVICE: Fecal Occult Bld: NEGATIVE

## 2010-04-26 NOTE — Progress Notes (Signed)
Summary: REFILL  Phone Note Refill Request Call back at Work Phone [phone] Message from:  Patient on April 17, 2010 1:31 PM  Refills Requested: Medication #1:  WELLBUTRIN XL 300 MG  TB24 1 by mouth qd   Dosage confirmed as above?Dosage Confirmed   Supply Requested: 3 months [facility]  Initial call taken by: Lavell Islam,  April 17, 2010 1:31 PM    Prescriptions: WELLBUTRIN XL 300 MG  TB24 (BUPROPION HCL) 1 by mouth qd  #90 x 0   Entered by:   Shonna Chock CMA   Authorized by:   Marga Melnick MD   Signed by:   Shonna Chock CMA on 04/17/2010   Method used:   Faxed to ...       [facility] (mail-[facility])             , [place]         Ph: [phone]       Fax: [phone]   RxID:   [id number]

## 2010-05-02 NOTE — Letter (Signed)
Summary: Primary Care Appointment Letter  [facility] [place]  [address] [place], [place] [address]   Phone: [phone]  Fax: [phone]    04/23/2010 MRN: [id number]  Dakota Vang [address] [place], [place]  [address]  Dear Mr. Salceda,   Your Primary Care Physician Marga Melnick MD has indicated that:    ____X__it is time to schedule an appointment(Last physical was 04/12/09)Please call to schedule physical for 2012.    _______you missed your appointment on______ and need to call and          reschedule.    _______you need to have lab work done.    _______you need to schedule an appointment discuss lab or test results.    _______you need to call to reschedule your appointment that is                       scheduled on _________.     Please call our office as soon as possible. Our phone number is [phone]. Please press option 1. Our office is open 8a-5p, Monday through Friday.     Thank you,    [facility]

## 2010-05-09 ENCOUNTER — Encounter: Payer: Self-pay | Admitting: Internal Medicine

## 2010-05-16 ENCOUNTER — Encounter (HOSPITAL_COMMUNITY): Payer: 59 | Admitting: Psychiatry

## 2010-05-16 DIAGNOSIS — F39 Unspecified mood [affective] disorder: Secondary | ICD-10-CM

## 2010-06-22 ENCOUNTER — Encounter: Payer: Self-pay | Admitting: Internal Medicine

## 2010-06-22 ENCOUNTER — Ambulatory Visit (INDEPENDENT_AMBULATORY_CARE_PROVIDER_SITE_OTHER): Payer: 59 | Admitting: Internal Medicine

## 2010-06-22 VITALS — BP 136/98 | HR 76 | Temp 98.9°F | Resp 14 | Ht 73.0 in | Wt 233.4 lb

## 2010-06-22 DIAGNOSIS — E785 Hyperlipidemia, unspecified: Secondary | ICD-10-CM

## 2010-06-22 DIAGNOSIS — Z0000 Encounter for general adult medical examination without abnormal findings: Secondary | ICD-10-CM

## 2010-06-22 LAB — CBC WITH DIFFERENTIAL/PLATELET
Basophils Absolute: 0.1 10*3/uL (ref 0.0–0.1)
Eosinophils Absolute: 0.3 10*3/uL (ref 0.0–0.7)
Lymphocytes Relative: 37.5 % (ref 12.0–46.0)
MCHC: 34.8 g/dL (ref 30.0–36.0)
Neutro Abs: 4.6 10*3/uL (ref 1.4–7.7)
Neutrophils Relative %: 51.8 % (ref 43.0–77.0)
Platelets: 204 10*3/uL (ref 150.0–400.0)
RDW: 13.2 % (ref 11.5–14.6)

## 2010-06-22 LAB — COMPREHENSIVE METABOLIC PANEL
ALT: 33 U/L (ref 0–53)
AST: 21 U/L (ref 0–37)
Albumin: 4.7 g/dL (ref 3.5–5.2)
Calcium: 9.9 mg/dL (ref 8.4–10.5)
Chloride: 102 mEq/L (ref 96–112)
Potassium: 4.8 mEq/L (ref 3.5–5.1)

## 2010-06-22 NOTE — Progress Notes (Signed)
  Subjective:    Patient ID: Dakota Vang, male    DOB: [birth year], 45 y.o.   MRN: [id number]  HPI He is here for a physical; he has had 2-3 episodes of Diverticulitis. Approximately  1 month ago he was treated in ER . Magnesium citrate normally can interrupt the cycle usually.    Review of Systems Patient reports no  vision/ hearing changes,anorexia, fever ,adenopathy, persistant / recurrent hoarseness, swallowing issues, chest pain,palpitations, edema,persistant / recurrent cough, hemoptysis, dyspnea(rest, exertional, paroxysmal nocturnal), melena,  abdominal pain, excessive heart burn, GU symptoms( dysuria, hematuria, pyuria) syncope, focal weakness, memory loss,numbness & tingling, skin/hair/nail changes,depression, anxiety, abnormal bruising/bleeding, musculoskeletal symptoms/signs.   Weight will fluctuate 10 pounds.  He intermittently will have rectal bleeding from internal hemorrhoids. BP not checked @ home.     Objective:   Physical Exam Gen.:Healthy and well-nourished in appearance. Alert, appropriate and cooperative throughout exam. Head: Normocephalic without obvious abnormalities;no  alopecia.Goatee Eyes: No corneal or conjunctival inflammation noted. Pupils equal round reactive to light and accommodation. Fundal exam is benign without hemorrhages, exudate, papilledema. Extraocular motion intact. Vision grossly normal. Ears: External  ear exam reveals no significant lesions or deformities. Canals clear .TMs normal. Hearing is grossly normal bilaterally. Nose: External nasal exam reveals no deformity or inflammation. Nasal mucosa are pink and moist. No lesions or exudates noted. Septum  Slightly dislocated.  Mouth: Oral mucosa and oropharynx reveal no lesions or exudates. Teeth in good repair. Neck: No deformities, masses, or tenderness noted. Range of motion normal. Thyroid w/o nodules or enlargement. Lungs: Normal respiratory effort; chest expands symmetrically. Lungs are clear  to auscultation without rales, wheezes, or increased work of breathing. Heart: Normal rate and rhythm. Normal S1 and S2. No gallop, click, or rub. No murmur. S4 Abdomen: Bowel sounds normal; abdomen soft and nontender. No masses, organomegaly or hernias noted. Genitalia:  Done in ER last month[place]ocoele on L . DRE  Minimal asymmetry(L> R) w/o enlargement , induration or nodules . Musculoskeletal/extremities: No deformity or scoliosis noted of  the thoracic or lumbar spine. No clubbing, cyanosis, edema, or deformity noted. Range of motion  normal .Tone & strength  normal.Joints normal. Nail health  good. Vascular: Carotid, radial artery, dorsalis pedis and dorsalis posterior tibial pulses are full and equal. No bruits present. Neurologic: Alert and oriented x3. Deep tendon reflexes symmetrical and normal. Skin: Intact without suspicious lesions or rashes.  Lymph: No cervical, axillary, or inguinal lymphadenopathy present.  Psych: Mood and affect are normal. Normally interactive                                                                                        Assessment & Plan:   #1 wellness exam; no acute issues  #2 diverticulosis with intermittent diverticulitis  #3 dyslipidemia  #4 hypertension; questionable level of control  Plan: #1 advance cholesterol testing reassessment  #2 trial of Align , probiotic, to help prevent recurrent diverticulitis.

## 2010-06-22 NOTE — Patient Instructions (Addendum)
Use Align daily as trial.   Monitor blood pressure of 5-7 times a week. The goal is an average less than 140/90,ideally be less than 135/85.                                                                                                                                                              Preventive Health Care: Exercise at least 30-45 minutes a day,  3-4 days a week.  Eat a low-fat diet with lots of fruits and vegetables, up to 7-9 servings per day. Avoid obesity; your goal is waist measurement < 40 inches.Consume less than 40 grams of sugar per day from foods & drinks with High Fructose Corn Sugar as #2,3 or # 4 on label. Eye Doctor - have an eye exam @ least annually.                                                                                  Health Care Power of Attorney & Living Will. Complete if not in place ; these place you in charge of your health care decisions. Depression is common in our stressful world.If you're feeling down or losing interest in things you normally enjoy, please call .

## 2010-06-22 NOTE — Assessment & Plan Note (Signed)
NMR Liporofile 2005: LDL 215(2857/1804), HDL 26, TG 215. LDL goal = < 100.

## 2010-07-01 ENCOUNTER — Other Ambulatory Visit: Payer: Self-pay | Admitting: Internal Medicine

## 2010-07-13 ENCOUNTER — Encounter: Payer: Self-pay | Admitting: Internal Medicine

## 2010-07-20 ENCOUNTER — Other Ambulatory Visit: Payer: Self-pay | Admitting: Internal Medicine

## 2010-07-24 NOTE — Assessment & Plan Note (Signed)
[facility]                         GASTROENTEROLOGY OFFICE NOTE   SEBERT, STOLLINGS                      MRN:          [id number]  DATE:10/22/2006                            DOB:          [birth year]    REASON FOR CONSULTATION:  Hematochezia, family history of colon cancer,  GERD.   HISTORY OF PRESENT ILLNESS:  Mr. Dakota Vang is a very nice 45 year old white  male who was previously a patient of Dr. Richarda Osmond.  Patient's  father developed colon cancer in his late 52s or early 23s.  Patient's  brother has had colon polyps.  No other family members with colon polyps  or colon cancer.  He previously underwent colonoscopy due to a history  of hematochezia in September of 2002 which showed extensive  diverticulosis and internal and external hemorrhoids.  The patient  states he has intermittent hemorrhoidal swelling that is painful and  intermittent bright red blood per rectum that is generally problematic  after eating spicy foods.  He has about a 5 year history of GERD which  has been under excellent control on daily Prilosec.  He notes no  dysphagia, odynophagia, weight loss, melena, change in stool caliber,  change in bowel habits, constipation or diarrhea.   PAST MEDICAL HISTORY:  1. Hypertension.  2. Diverticulosis.  3. Hemorrhoids.  4. Hyperlipidemia.  5. Status post tonsillectomy.  6. Status post LASIK.   CURRENT MEDICATIONS:  Listed on the chart, updated and reviewed.   MEDICATION ALLERGIES:  PENICILLIN.   SOCIAL HISTORY:  Per the handwritten form   REVIEW OF SYSTEMS:  Per the handwritten form   PHYSICAL EXAMINATION:  Well-developed, well-nourished white male in no  acute distress.  Height 6 feet 1 inches, weight 246 pounds, blood  pressure is 136/70, pulse 80 and regular.  HEENT: anicteric sclerae, oropharynx clear.  CHEST:  Clear to auscultation bilaterally.  CARDIAC:  Regular rate and rhythm without murmurs appreciated.  ABDOMEN:  Soft, nontender, nondistended, normoactive bowel sounds.  No  palpable organomegaly, masses or hernias.  RECTAL:  Deferred to time of colonoscopy.  EXTREMITIES:  Without clubbing, cyanosis or edema.  NEUROLOGIC:  Alert and oriented x3, grossly nonfocal.   ASSESSMENT/PLAN:  Intermittent hematochezia, first degree relative with  colon cancer at about age 58 and chronic gastroesophageal reflux  disease.  Rule out colorectal neoplasms and screen for Barrett's  esophagus.  Risks, benefits and alternatives to colonoscopy with  possible biopsy, possible polypectomy and possible destruction of  internal hemorrhoids discussed with the patient and he consents to  proceed.  Upper endoscopy with possible biopsy discussed with the  patient and he consents to proceed.  He will remain on standard anti-  reflux measures and Prilosec on a daily basis.  He is advised to  maintain a long term high fiber diet with increased fluid intake for his  known history of diverticulosis.     Venita Lick. Russella Dar, MD, [facility]  Electronically Signed    MTS/MedQ  DD: 10/22/2006  DT: 10/23/2006  Job #: [id number]   cc:   Titus Dubin. Alwyn Ren, MD,FACP,FCCP

## 2010-08-15 ENCOUNTER — Encounter (HOSPITAL_COMMUNITY): Payer: 59 | Admitting: Psychiatry

## 2010-08-15 DIAGNOSIS — F39 Unspecified mood [affective] disorder: Secondary | ICD-10-CM

## 2010-10-10 ENCOUNTER — Other Ambulatory Visit: Payer: Self-pay | Admitting: Internal Medicine

## 2010-10-31 ENCOUNTER — Encounter (INDEPENDENT_AMBULATORY_CARE_PROVIDER_SITE_OTHER): Payer: 59 | Admitting: Psychiatry

## 2010-10-31 DIAGNOSIS — F39 Unspecified mood [affective] disorder: Secondary | ICD-10-CM

## 2010-11-07 ENCOUNTER — Encounter (HOSPITAL_COMMUNITY): Payer: 59 | Admitting: Psychiatry

## 2010-11-08 ENCOUNTER — Other Ambulatory Visit: Payer: Self-pay | Admitting: Internal Medicine

## 2011-01-17 ENCOUNTER — Encounter (HOSPITAL_COMMUNITY): Payer: Self-pay | Admitting: Psychology

## 2011-01-23 ENCOUNTER — Encounter (HOSPITAL_COMMUNITY): Payer: Self-pay | Admitting: Psychiatry

## 2011-01-23 ENCOUNTER — Ambulatory Visit (INDEPENDENT_AMBULATORY_CARE_PROVIDER_SITE_OTHER): Payer: 59 | Admitting: Psychiatry

## 2011-01-23 VITALS — Wt 223.0 lb

## 2011-01-23 DIAGNOSIS — F39 Unspecified mood [affective] disorder: Secondary | ICD-10-CM

## 2011-01-23 DIAGNOSIS — F988 Other specified behavioral and emotional disorders with onset usually occurring in childhood and adolescence: Secondary | ICD-10-CM

## 2011-01-23 MED ORDER — METHYLPHENIDATE HCL 10 MG PO TABS: ORAL_TABLET | ORAL | Status: DC

## 2011-01-23 MED ORDER — LAMOTRIGINE 150 MG PO TABS: ORAL_TABLET | ORAL | Status: DC

## 2011-01-23 NOTE — Progress Notes (Signed)
Patient came today for his followup appointment he's been compliant with his medication Ritalin and Lamictal. Recently he has been sleeping much better and reported that he had a good summer. He denies any agitation anger or mood swings. He is able to spend a good amount of time but the family and able to be productive at work. His attention and concentration is much better. He is more organized and able to finish the task on time. There has been no change in his appetite or weight. He is not drinking or using any drugs. Denies any side effects of the medication including any itching rash chest pain or palpitation.  Mental status examination patient is pleasant cooperative maintained good eye contact. His speech is soft clear and coherent. He denies any active or passive suicidal thinking. He denies any auditory hallucinations suicidal thoughts or homicidal thoughts. There are no psychotic symptoms present. His attention and concentration is good. He's alert and oriented x3. There are no abnormal movements noted. His insight judgment and impulse control is good  Assessment. Attention deficit disorder. Mood disorder NOS  Plan I will continue his Ritalin 10 mg he is taking 1 in the morning and noon. I will also continue his Lamictal 150 mg twice a day. I explained the risks and benefits of medication in detail. I will see him again in 3 months. We will consider labs on his next appointment

## 2011-03-12 HISTORY — PX: COLONOSCOPY: SHX174

## 2011-03-16 ENCOUNTER — Other Ambulatory Visit: Payer: Self-pay | Admitting: Internal Medicine

## 2011-04-24 ENCOUNTER — Ambulatory Visit (INDEPENDENT_AMBULATORY_CARE_PROVIDER_SITE_OTHER): Payer: 59 | Admitting: Psychiatry

## 2011-04-24 ENCOUNTER — Encounter (HOSPITAL_COMMUNITY): Payer: Self-pay | Admitting: Psychiatry

## 2011-04-24 DIAGNOSIS — F988 Other specified behavioral and emotional disorders with onset usually occurring in childhood and adolescence: Secondary | ICD-10-CM

## 2011-04-24 DIAGNOSIS — F39 Unspecified mood [affective] disorder: Secondary | ICD-10-CM

## 2011-04-24 MED ORDER — METHYLPHENIDATE HCL 10 MG PO TABS: ORAL_TABLET | ORAL | Status: DC

## 2011-04-24 MED ORDER — LAMOTRIGINE 150 MG PO TABS: ORAL_TABLET | ORAL | Status: DC

## 2011-04-24 NOTE — Progress Notes (Signed)
Chief complaint I'm doing good and any medication  History of present illness Patient is 46 year old Caucasian married employed man who came for his followup appointment. He's been taking medication for his mood and ADD symptoms. He is been compliant with the Lamictal and Ritalin. He takes Wellbutrin from his primary care physician. He reported no side effects of medication. Lately he has been notice more tired decreased energy. He admitted that he has been very busy at work but he also glad that he had a job. He denies any agitation anger or mood swings. He does not drink or abuses is Ritalin. He is a good Christmas and holidays. He denies any agitation anger or mood swings.  Past psychiatric history Patient has a history of inpatient psychiatric treatment. He denies any history of suicidal attempt or thoughts. He has been on Wellbutrin from his primary care Dr. He try Prozac 2 years ago however he had limited response.  Medical history Patient has history of polyps in his colon, diverticulitis, hyperlipidemia, hypertension and GERD.  Family history Patient brother has severe depression and has history of inpatient treatment.  Psychosocial history Patient was born and raised in [place]. He had a good childhood. He moved to [place] 6 years ago. She's been married for 11 years. He has 3 children. He has a good social network. He denies any history of abuse in the past.  Alcohol and substance use history Patient admitted history of DWI and drinking in the past however he has been sober since we started him on Ritalin.  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  Mental status examination Patient is casually dressed and well groomed. He is tall with good build and appears to be in his his stated age. He is calm cooperative and pleasant. His speech is soft clear and coherent. His thought process is logical linear and  goal-directed. He denies any active or passive suicidal thoughts or homicidal thoughts. He denies any orderly visual hallucination. There is no tremors shakes or extrapyramidal side effects noted. His attention and concentration is good. Is alert and oriented x3. His insight judgment and impulse control is okay.  Assessment Axis I attention deficit disorder, mood disorder NOS Axis II deferred Axis III see medical history Axis IV mild Axis V 65-70  Plan At this time patient is in stable on Lamictal and Wellbutrin which is prescribed by his primary care physician. He is also taking Ritalin which is helping his focus attention. He reported no side effects of medication. He is scheduled to have blood work in April. I asked him to send [place] a copy of the blood results. I explained risks and benefits of medication including interaction with alcohol. I will see him again in 3 months. Time spent 30 minutes

## 2011-05-17 ENCOUNTER — Other Ambulatory Visit: Payer: Self-pay | Admitting: Internal Medicine

## 2011-05-17 NOTE — Telephone Encounter (Signed)
Prescription sent to pharmacy.  Patient is due for annual physical

## 2011-05-24 ENCOUNTER — Encounter: Payer: Self-pay | Admitting: Internal Medicine

## 2011-05-24 ENCOUNTER — Ambulatory Visit (INDEPENDENT_AMBULATORY_CARE_PROVIDER_SITE_OTHER): Payer: 59 | Admitting: Internal Medicine

## 2011-05-24 VITALS — BP 128/84 | HR 99 | Temp 97.8°F | Wt 233.0 lb

## 2011-05-24 DIAGNOSIS — J329 Chronic sinusitis, unspecified: Secondary | ICD-10-CM

## 2011-05-24 MED ORDER — AZITHROMYCIN 250 MG PO TABS: ORAL_TABLET | ORAL | Status: AC

## 2011-05-24 MED ORDER — FLUTICASONE PROPIONATE 50 MCG/ACT NA SUSP: 2.0000 | Freq: Every day | NASAL | Status: DC

## 2011-05-24 NOTE — Patient Instructions (Signed)
Rest, fluids , tylenol For cough, take Mucinex DM twice a day as needed  flonase claritin or allegra or zyrtec  Take the antibiotic as prescribed ----> zithromax 250 mg  2 tabs first day then one a day 8 days  Call if no better in few days Call anytime if the symptoms are severe

## 2011-05-24 NOTE — Progress Notes (Signed)
  Subjective:    Patient ID: Dakota Vang, male    DOB: [birth year], 46 y.o.   MRN: [id number]  HPI Acute visit, sick for 3 or 4 days, "head cold".  complains of sinus congestion, pressure, sneezing quite a bit. He already is started over-the-counter antihistaminic and flonase   Past Medical History  Diagnosis Date  . GERD (gastroesophageal reflux disease)   . Colon polyps 2008    Dr Russella Dar  . Diverticulosis 2002    Dr Corinda Gubler  . Hyperlipidemia   . Diverticulitis   . ADD (attention deficit disorder with hyperactivity)     Dr. Minta Balsam  . Anxiety     Dr. Minta Balsam    Review of Systems No fever , some chills last night No nausea, vomiting, diarrhea. No chest congestion or cough. Some clear nasal discharge, eyes are also itchy    Objective:   Physical Exam General -- alert, well-developed, and well-nourished. NAD  Neck --no LADs HEENT -- TMs normal, throat w/o redness, face symmetric and not tender to palpation, nose quite congested , EOMI Lungs -- normal respiratory effort, no intercostal retractions, no accessory muscle use, and normal breath sounds.   Heart-- normal rate, regular rhythm, no murmur, and no gallop.        Assessment & Plan:   acute sinusitis, allergic versus bacterial. Patient is convinced it is bacterial, he already started antihistaminic and a leftover Flonase and is not getting better. Request a Z-Pak x2, historically one pack does not help. See instructions

## 2011-06-01 ENCOUNTER — Other Ambulatory Visit: Payer: Self-pay | Admitting: Internal Medicine

## 2011-06-03 NOTE — Telephone Encounter (Signed)
Refill done.  

## 2011-06-14 ENCOUNTER — Other Ambulatory Visit: Payer: Self-pay | Admitting: Internal Medicine

## 2011-06-19 ENCOUNTER — Other Ambulatory Visit (HOSPITAL_COMMUNITY): Payer: Self-pay | Admitting: *Deleted

## 2011-06-19 MED ORDER — METHYLPHENIDATE HCL 10 MG PO TABS: ORAL_TABLET | ORAL | Status: DC

## 2011-07-13 ENCOUNTER — Other Ambulatory Visit: Payer: Self-pay | Admitting: Internal Medicine

## 2011-07-24 ENCOUNTER — Encounter: Payer: Self-pay | Admitting: Internal Medicine

## 2011-07-24 ENCOUNTER — Ambulatory Visit (HOSPITAL_COMMUNITY): Payer: Self-pay | Admitting: Psychiatry

## 2011-07-24 ENCOUNTER — Encounter (HOSPITAL_COMMUNITY): Payer: Self-pay

## 2011-08-15 ENCOUNTER — Other Ambulatory Visit: Payer: Self-pay | Admitting: Internal Medicine

## 2011-08-21 ENCOUNTER — Ambulatory Visit (INDEPENDENT_AMBULATORY_CARE_PROVIDER_SITE_OTHER): Payer: 59 | Admitting: Psychiatry

## 2011-08-21 ENCOUNTER — Ambulatory Visit (INDEPENDENT_AMBULATORY_CARE_PROVIDER_SITE_OTHER): Payer: 59 | Admitting: Internal Medicine

## 2011-08-21 ENCOUNTER — Encounter: Payer: Self-pay | Admitting: Internal Medicine

## 2011-08-21 ENCOUNTER — Encounter (HOSPITAL_COMMUNITY): Payer: Self-pay | Admitting: Psychiatry

## 2011-08-21 VITALS — Wt 227.0 lb

## 2011-08-21 VITALS — BP 124/84 | HR 90 | Temp 98.2°F | Resp 16 | Ht 71.0 in | Wt 228.1 lb

## 2011-08-21 DIAGNOSIS — K5792 Diverticulitis of intestine, part unspecified, without perforation or abscess without bleeding: Secondary | ICD-10-CM

## 2011-08-21 DIAGNOSIS — F39 Unspecified mood [affective] disorder: Secondary | ICD-10-CM

## 2011-08-21 DIAGNOSIS — Z136 Encounter for screening for cardiovascular disorders: Secondary | ICD-10-CM

## 2011-08-21 DIAGNOSIS — Z0000 Encounter for general adult medical examination without abnormal findings: Secondary | ICD-10-CM

## 2011-08-21 DIAGNOSIS — K5732 Diverticulitis of large intestine without perforation or abscess without bleeding: Secondary | ICD-10-CM

## 2011-08-21 DIAGNOSIS — E785 Hyperlipidemia, unspecified: Secondary | ICD-10-CM

## 2011-08-21 DIAGNOSIS — F988 Other specified behavioral and emotional disorders with onset usually occurring in childhood and adolescence: Secondary | ICD-10-CM

## 2011-08-21 LAB — LIPID PANEL
HDL: 39.9 mg/dL (ref >39.00)
Total CHOL/HDL Ratio: 4
Triglycerides: 132 mg/dL (ref 0.0–149.0)
VLDL: 26.4 mg/dL (ref 0.0–40.0)

## 2011-08-21 LAB — CBC WITH DIFFERENTIAL/PLATELET
Basophils Absolute: 0.1 10*3/uL (ref 0.0–0.1)
Basophils Relative: 1 % (ref 0.0–3.0)
Eosinophils Absolute: 0.3 10*3/uL (ref 0.0–0.7)
MCHC: 33.4 g/dL (ref 30.0–36.0)
MCV: 89.7 fl (ref 78.0–100.0)
Monocytes Absolute: 0.8 10*3/uL (ref 0.1–1.0)
Neutrophils Relative %: 56.9 % (ref 43.0–77.0)
RBC: 5.04 Mil/uL (ref 4.22–5.81)
RDW: 13.1 % (ref 11.5–14.6)

## 2011-08-21 LAB — HEPATIC FUNCTION PANEL
Albumin: 5 g/dL (ref 3.5–5.2)
Bilirubin, Direct: 0 mg/dL (ref 0.0–0.3)
Total Protein: 8 g/dL (ref 6.0–8.3)

## 2011-08-21 LAB — POCT URINALYSIS DIPSTICK
Bilirubin, UA: NEGATIVE
Blood, UA: NEGATIVE
Glucose, UA: NEGATIVE
Spec Grav, UA: <=1.005

## 2011-08-21 LAB — BASIC METABOLIC PANEL
BUN: 15 mg/dL (ref 6–23)
CO2: 31 mEq/L (ref 19–32)
Calcium: 10 mg/dL (ref 8.4–10.5)
Chloride: 99 mEq/L (ref 96–112)
Creatinine, Ser: 1 mg/dL (ref 0.4–1.5)
Glucose, Bld: 87 mg/dL (ref 70–99)

## 2011-08-21 MED ORDER — METRONIDAZOLE 500 MG PO TABS: 500.0000 mg | ORAL_TABLET | Freq: Three times a day (TID) | ORAL | Status: AC

## 2011-08-21 MED ORDER — CIPROFLOXACIN HCL 500 MG PO TABS: 500.0000 mg | ORAL_TABLET | Freq: Two times a day (BID) | ORAL | Status: AC

## 2011-08-21 MED ORDER — LAMOTRIGINE 150 MG PO TABS: ORAL_TABLET | ORAL | Status: DC

## 2011-08-21 MED ORDER — FLUTICASONE PROPIONATE 50 MCG/ACT NA SUSP: 2.0000 | Freq: Every day | NASAL | Status: DC

## 2011-08-21 MED ORDER — RAMIPRIL 10 MG PO CAPS: 10.0000 mg | ORAL_CAPSULE | Freq: Every day | ORAL | Status: DC

## 2011-08-21 MED ORDER — ROSUVASTATIN CALCIUM 20 MG PO TABS: ORAL_TABLET | ORAL | Status: DC

## 2011-08-21 MED ORDER — METHYLPHENIDATE HCL 10 MG PO TABS: ORAL_TABLET | ORAL | Status: DC

## 2011-08-21 MED ORDER — BUPROPION HCL ER (XL) 300 MG PO TB24: ORAL_TABLET | ORAL | Status: DC

## 2011-08-21 MED ORDER — AMLODIPINE BESYLATE 5 MG PO TABS: 5.0000 mg | ORAL_TABLET | Freq: Every day | ORAL | Status: DC

## 2011-08-21 MED ORDER — OMEPRAZOLE 20 MG PO CPDR: DELAYED_RELEASE_CAPSULE | ORAL | Status: DC

## 2011-08-21 NOTE — Progress Notes (Signed)
  Subjective:    Patient ID: Dakota Vang, male    DOB: [birth year], 46 y.o.   MRN: [id number]  HPI   Dakota Vang is here for a physical;acute issues include probable  exacerbation of diverticulitis    Review of Systems On 08/17/11 he had some frank diarrhea. On 6/9 he began to have left lower quadrant dull pain which subsequently increased to sharp pain. Also he had chills and sweats that day without fever. Subsequently he has developed some constipation. This is the clinical picture he gets with diverticulitis. Magnesium helped constipation. He denies melena, rectal bleeding, hematuria, pyuria,or dysuria.     Objective:   Physical Exam Gen.: Healthy and well-nourished in appearance. Alert, appropriate and cooperative throughout exam. Head: Normocephalic without obvious abnormalities;  goatee  Eyes: No corneal or conjunctival inflammation noted. Pupils equal round reactive to light and accommodation. Fundal exam is benign without hemorrhages, exudate, papilledema. Extraocular motion intact. Vision grossly normal. Ears: External  ear exam reveals no significant lesions or deformities. Canals clear .TMs normal. Hearing is grossly normal bilaterally. Nose: External nasal exam reveals no deformity or inflammation. Nasal mucosa are pink and moist. No lesions or exudates noted. . Mouth: Oral mucosa and oropharynx reveal no lesions or exudates. Teeth in good repair. Neck: No deformities, masses, or tenderness noted. Range of motion & Thyroid normal Lungs: Normal respiratory effort; chest expands symmetrically. Lungs are clear to auscultation without rales, wheezes, or increased work of breathing. Heart: Normal rate and rhythm. Normal S1 and S2. No gallop, click, or rub. S4 w/o murmur. Abdomen: Bowel sounds normal; abdomen soft but tender in both LQ. No masses, organomegaly or hernias noted. Genitalia/ DRE: There is slight weakness of the right inguinal canal without true herniation. There is minor  granuloma formation in the left epididymal area. Prostate is normal without enlargement, induration, or nodularity. There is no adnexal tenderness                                       Musculoskeletal/extremities: No deformity or scoliosis noted of  the thoracic or lumbar spine. No clubbing, cyanosis, edema, or deformity noted. Range of motion  normal .Tone & strength  normal.Joints normal. Nail health  Good. Ganglion L wrist Vascular: Carotid, radial artery, dorsalis pedis and  posterior tibial pulses are full and equal. No bruits present. Neurologic: Alert and oriented x3. Deep tendon reflexes symmetrical and normal. Negative SLR.       Skin: Intact without suspicious lesions or rashes. Lymph: No cervical, axillary, or inguinal lymphadenopathy present. Psych: Mood and affect are normal. Normally interactive                                                                                         Assessment & Plan:  #1 comprehensive physical exam; historically and clinically mild acute diverticulitis is suggested #2 see Problem List with Assessments & Recommendations Plan: see Orders

## 2011-08-21 NOTE — Progress Notes (Signed)
Chief complaint Medication management and followup.    History of present illness Patient is 46 year old Caucasian married employed man who came for his followup appointment. He's been compliant with her psychiatric medication.  Recently he has been more stressed about his job .  However he denies any agitation anger mood swing.  His wife is adjusting her schedule to stay with the kids who are now off from school.  Patient likes his current psychiatric medication.  He is able to lose 5 pounds from his last visit.  He is more focused and attentive at work.  He is sleeping better.  Despite her stress at work he is able to spend time with her family.  He denies any crying spells or violent episode.  He likes his Ritalin.  He doesn't do binge drinking or use any illegal substance.  Today patient has a complete physical and blood work by his primary care physician Dr. Alwyn Ren.  He has diverticulitis and given antibiotic.  Current psychiatric medication Ritalin 10 mg 1 in the morning and 1 at noon time Lamictal 150 mg twice a day Wellbutrin XL 300 mg prescribed by his primary care physician.  Past psychiatric history Patient has a history of inpatient psychiatric treatment. He denies any history of suicidal attempt or thoughts. He has been on Wellbutrin from his primary care Dr. He try Prozac 2 years ago however he had limited response.  Medical history Patient has history of polyps in his colon, diverticulitis, hyperlipidemia, hypertension and GERD.  His primary care physician is Dr. Alwyn Ren.  He saw his primary care physician today for his complete physical and blood work.  His blood work is pending.  Family history Patient brother has severe depression and has history of inpatient treatment.  Psychosocial history Patient was born and raised in [place]. He had a good childhood. He moved to [place] 6 years ago. She's been married for 11 years. He has 3 children. He has a good social network. He denies  any history of abuse in the past.  Alcohol and substance use history Patient admitted history of DWI and drinking in the past however he has been sober since we started him on Ritalin.  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  Mental status examination Patient is casually dressed and well groomed. He is tall with good build and appears to be in his stated age. He is calm cooperative and pleasant. His speech is soft clear and coherent. His thought process is logical linear and goal-directed. He denies any active or passive suicidal thoughts or homicidal thoughts. He denies any orderly visual hallucination. There is no tremors shakes or extrapyramidal side effects noted. His attention and concentration is good. Is alert and oriented x3. His insight judgment and impulse control is okay.  Assessment Axis I attention deficit disorder, mood disorder NOS Axis II deferred Axis III see medical history Axis IV mild Axis V 65-70  Plan Patient is stable on his current psychiatric medication.  He denies any side effects including any rash or itching with Lamictal.  He is not abusing are asking early refills of his Ritalin.  I will continue his current psychiatric medication.  Patient has a blood work today we will followup on his result.  I will see him again in 3 months.  Portion of this note is generated with voice recognition software and may contain typographical error.

## 2011-08-21 NOTE — Patient Instructions (Addendum)
Preventive Health Care: Exercise at least 30-45 minutes a day,  3-4 days a week.  Eat a low-fat diet with lots of fruits and vegetables, up to 7-9 servings per day.  Consume less than 40 grams of sugar per day from foods & drinks with High Fructose Corn Sugar as #1,2,3 or # 4 on label. Please try to go on My Chart within the next 24 hours to allow me to release the results directly to you.  

## 2011-08-26 ENCOUNTER — Encounter: Payer: Self-pay | Admitting: *Deleted

## 2011-09-20 ENCOUNTER — Other Ambulatory Visit: Payer: Self-pay | Admitting: Internal Medicine

## 2011-10-02 ENCOUNTER — Encounter: Payer: Self-pay | Admitting: Gastroenterology

## 2011-10-07 ENCOUNTER — Other Ambulatory Visit (HOSPITAL_COMMUNITY): Payer: Self-pay | Admitting: *Deleted

## 2011-10-07 DIAGNOSIS — F39 Unspecified mood [affective] disorder: Secondary | ICD-10-CM

## 2011-10-08 ENCOUNTER — Other Ambulatory Visit (HOSPITAL_COMMUNITY): Payer: Self-pay | Admitting: Psychiatry

## 2011-10-08 MED ORDER — METHYLPHENIDATE HCL 10 MG PO TABS: ORAL_TABLET | ORAL | Status: DC

## 2011-10-23 ENCOUNTER — Other Ambulatory Visit (HOSPITAL_COMMUNITY): Payer: Self-pay | Admitting: *Deleted

## 2011-10-23 ENCOUNTER — Telehealth (HOSPITAL_COMMUNITY): Payer: Self-pay

## 2011-10-23 NOTE — Telephone Encounter (Signed)
10:24am 10/23/11 pt came to office and pick-up his rx script./sh

## 2011-11-18 ENCOUNTER — Encounter (HOSPITAL_COMMUNITY): Payer: Self-pay | Admitting: Psychiatry

## 2011-11-18 ENCOUNTER — Ambulatory Visit (INDEPENDENT_AMBULATORY_CARE_PROVIDER_SITE_OTHER): Payer: 59 | Admitting: Psychiatry

## 2011-11-18 VITALS — BP 147/99 | HR 83 | Wt 226.0 lb

## 2011-11-18 DIAGNOSIS — F909 Attention-deficit hyperactivity disorder, unspecified type: Secondary | ICD-10-CM

## 2011-11-18 DIAGNOSIS — F39 Unspecified mood [affective] disorder: Secondary | ICD-10-CM

## 2011-11-18 MED ORDER — METHYLPHENIDATE HCL 10 MG PO TABS: ORAL_TABLET | ORAL | Status: DC

## 2011-11-18 MED ORDER — LAMOTRIGINE 150 MG PO TABS: ORAL_TABLET | ORAL | Status: DC

## 2011-11-18 NOTE — Progress Notes (Signed)
Chief complaint Medication management and followup.    History of present illness Patient is 46 year old Caucasian married employed man who came for his followup appointment. He's been compliant with her psychiatric medication.  He is very happy about his job.  The only concern he has feeling tired in the evening.  His children are start school and keeping him very busy.  He does not want to change his medication.  He denies any recent agitation anger mood swing.  He has seen his primary care physician in July and reported everything was normal.  We are still waiting for his lab work .  His attention and focus is good.  He denies any complain of agitation hallucination or paranoid thinking.  He enjoys spending time with his family.  He's not drinking or using any illegal substance.  Current psychiatric medication Ritalin 10 mg 1 in the morning and 1 at noon time Lamictal 150 mg twice a day Wellbutrin XL 300 mg prescribed by his primary care physician.  Past psychiatric history Patient denies any history of inpatient psychiatric treatment. He denies any history of suicidal attempt or thoughts. He has been on Wellbutrin from his primary care Dr. He try Prozac 2 years ago however he had limited response.  Medical history Patient has history of polyps in his colon, diverticulitis, hyperlipidemia, hypertension and GERD.  His primary care physician is Dr. Alwyn Ren.  He saw his primary care physician and claimed his blood results are normal.  We are waiting for the results.    Family history Patient brother has severe depression and has history of inpatient treatment.  Psychosocial history Patient was born and raised in [place]. He had a good childhood. He moved to [place] 6 years ago. She's been married for 11 years. He has 3 children. He has a good social network. He denies any history of abuse in the past.  Alcohol and substance use history Patient admitted history of DWI and drinking in the past  however he has been sober since we started him on Ritalin.  Education and work history Patient worked as a Aeronautical engineer.   Mental status examination Patient is casually dressed and well groomed. He is tall with good build and appears to be in his stated age. He is calm cooperative and pleasant. His speech is soft clear and coherent. His thought process is logical linear and goal-directed. He denies any active or passive suicidal thoughts or homicidal thoughts. He denies any orderly visual hallucination. There is no tremors shakes or extrapyramidal side effects noted. His attention and concentration is good. Is alert and oriented x3. His insight judgment and impulse control is okay.  Assessment Axis I attention deficit disorder, mood disorder NOS Axis II deferred Axis III see medical history Axis IV mild Axis V 65-70  Plan Patient is stable on his current psychiatric medication.  He denies any side effects including any rash or itching with Lamictal.  He is not abusing are asking early refills of his Ritalin.  I will continue his current psychiatric medication.  Patient has a blood. we will followup on his result.  I will see him again in 3 months.  Portion of this note is generated with voice recognition software and may contain typographical error.

## 2011-12-23 ENCOUNTER — Encounter: Payer: Self-pay | Admitting: Gastroenterology

## 2012-01-06 ENCOUNTER — Other Ambulatory Visit (HOSPITAL_COMMUNITY): Payer: Self-pay | Admitting: *Deleted

## 2012-01-06 DIAGNOSIS — F39 Unspecified mood [affective] disorder: Secondary | ICD-10-CM

## 2012-01-06 MED ORDER — METHYLPHENIDATE HCL 10 MG PO TABS: ORAL_TABLET | ORAL | Status: DC

## 2012-01-20 ENCOUNTER — Telehealth (HOSPITAL_COMMUNITY): Payer: Self-pay

## 2012-01-20 NOTE — Telephone Encounter (Signed)
11/;11/13 9:49am pt came to pick-up rx script./sh

## 2012-01-29 ENCOUNTER — Encounter: Payer: Self-pay | Admitting: Gastroenterology

## 2012-01-29 ENCOUNTER — Ambulatory Visit (AMBULATORY_SURGERY_CENTER): Payer: 59 | Admitting: *Deleted

## 2012-01-29 VITALS — Ht 73.0 in | Wt 238.0 lb

## 2012-01-29 DIAGNOSIS — Z1211 Encounter for screening for malignant neoplasm of colon: Secondary | ICD-10-CM

## 2012-01-29 DIAGNOSIS — Z86010 Personal history of colonic polyps: Secondary | ICD-10-CM

## 2012-01-29 DIAGNOSIS — Z800 Family history of malignant neoplasm of digestive organs: Secondary | ICD-10-CM

## 2012-01-29 MED ORDER — MOVIPREP 100 G PO SOLR: ORAL | Status: DC

## 2012-02-19 ENCOUNTER — Ambulatory Visit (AMBULATORY_SURGERY_CENTER): Payer: 59 | Admitting: Gastroenterology

## 2012-02-19 ENCOUNTER — Encounter: Payer: Self-pay | Admitting: Gastroenterology

## 2012-02-19 ENCOUNTER — Ambulatory Visit (HOSPITAL_COMMUNITY): Payer: Self-pay | Admitting: Psychiatry

## 2012-02-19 VITALS — BP 126/87 | HR 87 | Temp 97.3°F | Resp 12 | Ht 73.0 in | Wt 238.0 lb

## 2012-02-19 DIAGNOSIS — Z860100 Personal history of colon polyps, unspecified: Secondary | ICD-10-CM

## 2012-02-19 DIAGNOSIS — Z800 Family history of malignant neoplasm of digestive organs: Secondary | ICD-10-CM

## 2012-02-19 DIAGNOSIS — Z1211 Encounter for screening for malignant neoplasm of colon: Secondary | ICD-10-CM

## 2012-02-19 MED ORDER — SODIUM CHLORIDE 0.9 % IV SOLN: 500.0000 mL | INTRAVENOUS | Status: DC

## 2012-02-19 NOTE — Progress Notes (Signed)
Patient did not experience any of the following events: a burn prior to discharge; a fall within the facility; wrong site/side/patient/procedure/implant event; or a hospital transfer or hospital admission upon discharge from the facility. (G8907) Patient did not have preoperative order for IV antibiotic SSI prophylaxis. (G8918)  

## 2012-02-19 NOTE — Patient Instructions (Addendum)
Discharge instructions given with verbal understanding. Handouts on diverticulosis and a high fiber diet given. Resume previous medications.YOU HAD AN ENDOSCOPIC PROCEDURE TODAY AT THE [facility]: Refer to the procedure report that was given to you for any specific questions about what was found during the examination.  If the procedure report does not answer your questions, please call your gastroenterologist to clarify.  If you requested that your care partner not be given the details of your procedure findings, then the procedure report has been included in a sealed envelope for you to review at your convenience later.  YOU SHOULD EXPECT: Some feelings of bloating in the abdomen. Passage of more gas than usual.  Walking can help get rid of the air that was put into your GI tract during the procedure and reduce the bloating. If you had a lower endoscopy (such as a colonoscopy or flexible sigmoidoscopy) you may notice spotting of blood in your stool or on the toilet paper. If you underwent a bowel prep for your procedure, then you may not have a normal bowel movement for a few days.  DIET: Your first meal following the procedure should be a light meal and then it is ok to progress to your normal diet.  A half-sandwich or bowl of soup is an example of a good first meal.  Heavy or fried foods are harder to digest and may make you feel nauseous or bloated.  Likewise meals heavy in dairy and vegetables can cause extra gas to form and this can also increase the bloating.  Drink plenty of fluids but you should avoid alcoholic beverages for 24 hours.  ACTIVITY: Your care partner should take you home directly after the procedure.  You should plan to take it easy, moving slowly for the rest of the day.  You can resume normal activity the day after the procedure however you should NOT DRIVE or use heavy machinery for 24 hours (because of the sedation medicines used during the test).    SYMPTOMS TO  REPORT IMMEDIATELY: A gastroenterologist can be reached at any hour.  During normal business hours, 8:30 AM to 5:00 PM Monday through Friday, call [phone].  After hours and on weekends, please call the GI answering service at [phone] who will take a message and have the physician on call contact you.   Following lower endoscopy (colonoscopy or flexible sigmoidoscopy):  Excessive amounts of blood in the stool  Significant tenderness or worsening of abdominal pains  Swelling of the abdomen that is new, acute  Fever of 100F or higher  FOLLOW UP: If any biopsies were taken you will be contacted by phone or by letter within the next 1-3 weeks.  Call your gastroenterologist if you have not heard about the biopsies in 3 weeks.  Our staff will call the home number listed on your records the next business day following your procedure to check on you and address any questions or concerns that you may have at that time regarding the information given to you following your procedure. This is a courtesy call and so if there is no answer at the home number and we have not heard from you through the emergency physician on call, we will assume that you have returned to your regular daily activities without incident.  SIGNATURES/CONFIDENTIALITY: You and/or your care partner have signed paperwork which will be entered into your electronic medical record.  These signatures attest to the fact that that the information above on your   After Visit Summary has been reviewed and is understood.  Full responsibility of the confidentiality of this discharge information lies with you and/or your care-partner.   

## 2012-02-19 NOTE — Op Note (Signed)
[facility] [address] [place], [address]   COLONOSCOPY PROCEDURE REPORT  PATIENT: Dakota Vang, Dakota Vang  MR#: [id number] BIRTHDATE: 1965/10/28 , 46  yrs. old GENDER: Male ENDOSCOPIST: Meryl Dare, MD, [facility] PROCEDURE DATE:  02/19/2012 PROCEDURE:   Colonoscopy, diagnostic ASA CLASS:   Class II INDICATIONS:Patient's personal history of adenomatous colon polyps and patient's immediate family history of colon cancer. MEDICATIONS: MAC sedation, administered by CRNA and propofol (Diprivan) 500mg  IV DESCRIPTION OF PROCEDURE:   After the risks benefits and alternatives of the procedure were thoroughly explained, informed consent was obtained.  A digital rectal exam revealed no abnormalities of the rectum.   The LB [id number]  endoscope was introduced through the anus and advanced to the cecum, which was identified by both the appendix and ileocecal valve. No adverse events experienced.   The quality of the prep was good, using MoviPrep  The instrument was then slowly withdrawn as the colon was fully examined.   COLON FINDINGS: Moderate diverticulosis was noted in the transverse colon, descending colon, and sigmoid colon.   Mild diverticulosis was noted in the ascending colon.  Retroflexed views revealed no abnormalities. The time to cecum=4 minutes 23 seconds.  Withdrawal time=8 minutes 37 seconds.  The scope was withdrawn and the procedure completed.  COMPLICATIONS: There were no complications.  ENDOSCOPIC IMPRESSION: 1.   Moderate diverticulosis was noted in the transverse colon, descending colon, and sigmoid colon 2.   Mild diverticulosis was noted in the ascending colon  RECOMMENDATIONS: 1.  High fiber diet with liberal fluid intake. 2.  Repeat Colonoscopy in 5 years.   eSigned:  Meryl Dare, MD, [facility] 02/19/2012 10:59 AM

## 2012-02-20 ENCOUNTER — Telehealth: Payer: Self-pay | Admitting: *Deleted

## 2012-02-20 NOTE — Telephone Encounter (Signed)
  Follow up Call-  Call back number 02/19/2012  Post procedure Call Back phone  # [phone]  Permission to leave phone message Yes     Patient questions:  Do you have a fever, pain , or abdominal swelling? no Pain Score  0 *  Have you tolerated food without any problems? yes  Have you been able to return to your normal activities? yes  Do you have any questions about your discharge instructions: Diet   no Medications  no Follow up visit  no  Do you have questions or concerns about your Care? no  Actions: * If pain score is 4 or above: No action needed, pain <4.  Pt. Stated that he was pleased with DR. Russella Dar and the staff.

## 2012-02-26 ENCOUNTER — Ambulatory Visit (INDEPENDENT_AMBULATORY_CARE_PROVIDER_SITE_OTHER): Payer: 59 | Admitting: Psychiatry

## 2012-02-26 ENCOUNTER — Encounter (HOSPITAL_COMMUNITY): Payer: Self-pay | Admitting: Psychiatry

## 2012-02-26 VITALS — Wt 234.0 lb

## 2012-02-26 DIAGNOSIS — F39 Unspecified mood [affective] disorder: Secondary | ICD-10-CM

## 2012-02-26 DIAGNOSIS — F909 Attention-deficit hyperactivity disorder, unspecified type: Secondary | ICD-10-CM

## 2012-02-26 MED ORDER — METHYLPHENIDATE HCL 10 MG PO TABS: ORAL_TABLET | ORAL | Status: DC

## 2012-02-26 MED ORDER — LAMOTRIGINE 150 MG PO TABS: ORAL_TABLET | ORAL | Status: DC

## 2012-02-26 NOTE — Progress Notes (Signed)
Patient ID: Dakota Vang, male   DOB: 1965/10/02, 46 y.o.   MRN: [id number] Chief complaint Medication management and followup.    History of present illness Patient is 46 year old Caucasian married employed man who came for his followup appointment. He's been compliant with her psychiatric medication.  He had a good Thanksgiving.  He denies any side effects of medication.  He is happy with his job.  He has lost some weight from his last visit.  He denies any agitation anger mood swing.  His focus and attention is much improved.  He has any paranoia or any hallucination.  He does not drink or use any illegal substance.  Current psychiatric medication Ritalin 10 mg 1 in the morning and 1 at noon time Lamictal 150 mg twice a day Wellbutrin XL 300 mg prescribed by his primary care physician.  Past psychiatric history Patient denies any history of inpatient psychiatric treatment. He denies any history of suicidal attempt or thoughts. He has been on Wellbutrin from his primary care Dr. He try Prozac 2 years ago however he had limited response.  Medical history Patient has history of polyps in his colon, diverticulitis, hyperlipidemia, hypertension and GERD.  His primary care physician is Dr. Alwyn Ren.  He saw his primary care physician and claimed his blood results are normal.  We are waiting for the results.    Family history Patient brother has severe depression and has history of inpatient treatment.  Psychosocial history Patient was born and raised in [place]. He had a good childhood. He moved to [place] 6 years ago. She's been married for 11 years. He has 3 children. He has a good social network. He denies any history of abuse in the past.  Alcohol and substance use history Patient admitted history of DWI and drinking in the past however he has been sober since we started him on Ritalin.  Education and work history Patient worked as a Aeronautical engineer.   Mental status  examination Patient is casually dressed and well groomed. He is tall with good build and appears to be in his stated age. He is calm cooperative and pleasant. His speech is soft clear and coherent. His thought process is logical linear and goal-directed. He denies any active or passive suicidal thoughts or homicidal thoughts. He denies any orderly visual hallucination. There is no tremors shakes or extrapyramidal side effects noted. His attention and concentration is good. Is alert and oriented x3. His insight judgment and impulse control is okay.  Assessment Axis I attention deficit disorder, mood disorder NOS Axis II deferred Axis III see medical history Axis IV mild Axis V 65-70  Plan Patient is stable on his current psychiatric medication.  He denies any side effects including any rash or itching with Lamictal.  He is not abusing are asking early refills of his Ritalin.  I will continue his current psychiatric medication.  Patient has a blood. we will followup on his result.  I will see him again in 3 months.  Portion of this note is generated with voice recognition software and may contain typographical error.

## 2012-04-27 ENCOUNTER — Other Ambulatory Visit (HOSPITAL_COMMUNITY): Payer: Self-pay | Admitting: *Deleted

## 2012-04-27 DIAGNOSIS — F39 Unspecified mood [affective] disorder: Secondary | ICD-10-CM

## 2012-04-27 MED ORDER — METHYLPHENIDATE HCL 10 MG PO TABS: ORAL_TABLET | ORAL | Status: DC

## 2012-05-27 ENCOUNTER — Ambulatory Visit (INDEPENDENT_AMBULATORY_CARE_PROVIDER_SITE_OTHER): Payer: 59 | Admitting: Psychiatry

## 2012-05-27 ENCOUNTER — Encounter (HOSPITAL_COMMUNITY): Payer: Self-pay | Admitting: Psychiatry

## 2012-05-27 VITALS — BP 137/110 | HR 100 | Wt 243.6 lb

## 2012-05-27 DIAGNOSIS — F39 Unspecified mood [affective] disorder: Secondary | ICD-10-CM

## 2012-05-27 MED ORDER — METHYLPHENIDATE HCL 10 MG PO TABS: ORAL_TABLET | ORAL | Status: DC

## 2012-05-27 MED ORDER — LAMOTRIGINE 150 MG PO TABS: ORAL_TABLET | ORAL | Status: DC

## 2012-05-27 NOTE — Progress Notes (Signed)
[facility] [address] Progress Note  Dakota Vang [id number] 47 y.o.  05/27/2012 10:19 AM  Chief Complaint: I have gained weight.  I want to increase my Ritalin.  I'm sleeping too much.  History of Present Illness:  Patient is 47 year old Caucasian married employed male who came for his followup appointment.  Patient complained of hypersomnia, lack of energy and feeling tired.  He admitted gaining weight in past few weeks.  His blood pressure pressure is also high,  he admitted stressful job in past few weeks.  He had argument with one of his coworker.  He is also not happy with his retirement account.  He had put some money in his retirement account and now he received a letter that he does not qualify and he may have to pay [place].  He has noticed some irritability but denies any violence or aggression or any active or passive suicidal thoughts.  He endorse that job is very busy.  He has 3 boys and when he comes home he does not have enough time for his 3 boys.  However he has planning to celebrate his birthday this Friday.  He wants to take his kids to [employer] movie.  He is wondering if Ritalin can be further increased to help his weight loss and energy level.  He denies any hallucination or any paranoid thinking.  He's not drinking alcohol using any illegal substance.  [facility] and given steroids, narcotic pain medication and muscle relaxant.  Patient finished taking all these medication.  His back pain is much improved.    Suicidal Ideation: No Plan Formed: No Patient has means to carry out plan: No  Homicidal Ideation: No Plan Formed: No Patient has means to carry out plan: No  Review of Systems: Psychiatric: Agitation: No Hallucination: No Depressed Mood: Yes Insomnia: No Hypersomnia: Yes Altered Concentration: Irritability Feels Worthless: No Grandiose Ideas: No Belief In Special Powers: No New/Increased Substance  Abuse: No Compulsions: No  Neurologic: Headache: No Seizure: No Paresthesias: No  Medical History. Patient has multiple medical problems including diverticulitis, hyperlipidemia, hypertension, back pain and GERD.  His primary care physician is Marga Melnick .    Family and Social History: Patient endorse mother has depression and history of psychiatric inpatient treatment.  Patient is born and raised in [place].  He has a good childhood.  He's been living in [place] for past few years.  He's been married for more than 11 years.  He has 3 children.  He has a good social network.  He denies any history of abuse in the past.  Alcohol and substance use history. Patient admitted history of DWI and drinking alcohol in the past however when to be sober since he is taking Ritalin.  Outpatient Encounter Prescriptions as of 05/27/2012  Medication Sig Dispense Refill  . amLODipine (NORVASC) 5 MG tablet Take 1 tablet (5 mg total) by mouth daily.  90 tablet  3  . buPROPion (WELLBUTRIN XL) 300 MG 24 hr tablet TAKE 1 TABLET DAILY  90 tablet  3  . CRESTOR 20 MG tablet TAKE 1 TABLET DAILY AS DIRECTED  90 tablet  1  . fluticasone (FLONASE) 50 MCG/ACT nasal spray Place 2 sprays into the nose daily. 1-2 sprays each nostril daily *seasonal*  48 g  3  . lamoTRIgine (LAMICTAL) 150 MG tablet 1 Tab BID  180 tablet  0  . methylphenidate (RITALIN) 10 MG tablet  1 in am & noon  60 tablet  0  . MOVIPREP 100 G SOLR Take as directed.  1 kit  0  . omeprazole (PRILOSEC) 20 MG capsule TAKE 1 CAPSULE DAILY  90 capsule  3  . ramipril (ALTACE) 10 MG capsule Take 1 capsule (10 mg total) by mouth daily.  90 capsule  3  . [DISCONTINUED] lamoTRIgine (LAMICTAL) 150 MG tablet 1 Tab BID  180 tablet  0  . [DISCONTINUED] methylphenidate (RITALIN) 10 MG tablet 1 in am & noon  60 tablet  0   No facility-administered encounter medications on file as of 05/27/2012.    Past Psychiatric History/Hospitalization(s): Anxiety: Yes Bipolar  Disorder: No Depression: Patient has history of depression and mood swing for a long time.  He start taking psychotropic medication by his primary care physician Dr. Marga Melnick.  In the past she was given Prozac but limited response.  He is taking Wellbutrin XL 300 mg from his primary care physician. Mania: No Psychosis: No Schizophrenia: No Personality Disorder: No Hospitalization for psychiatric illness: No History of Electroconvulsive Shock Therapy: No Prior Suicide Attempts: No  Physical Exam: Constitutional:  BP 137/110  Pulse 100  Wt 243 lb 9.6 oz (110.496 kg)  BMI 32.15 kg/m2  General Appearance: well nourished, obese and Maintained fair eye contact.  He appears tense and anxious but cooperative.  Musculoskeletal: Strength & Muscle Tone: within normal limits Gait & Station: normal Patient leans: N/A  Psychiatric: Speech (describe rate, volume, coherence, spontaneity, and abnormalities if any): clear and coherent with normal tone volume.  Thought Process (describe rate, content, abstract reasoning, and computation): Logical and goal-directed.  No flight of ideas or any loose association.  Associations: Coherent, Relevant and Intact  Thoughts: normal  Mental Status: Orientation: oriented to person, place, time/date and situation Mood & Affect: Tired and anxious.  Affect is mood appropriate. Attention Span & Concentration: Good  Medical Decision Making (Choose Three): Established Problem, Stable/Improving (1), Review of Psycho-Social Stressors (1), New Problem, with no additional work-up planned (3), Review of Last Therapy Session (1) and Review of Medication Regimen & Side Effects (2)  Assessment: Axis I: Mood disorder NOS, attention deficit disorder.  Axis II: Deferred  Axis III:  Patient Active Problem List  Diagnosis  . HYPERLIPIDEMIA  . ADD  . HYPERTENSION  . HEMORRHOIDS, INTERNAL  . G E R D  . DIVERTICULOSIS, COLON  . COLONIC POLYPS, HX OF  .  DIVERTICULITIS, HX OF    Axis IV: Mild to moderate  Axis V: 65-70   Plan: I review his symptoms and recent stress at his job.  His blood pressure is high.  I explained that I cannot increase Ritalin if blood pressure is very high.  I recommend to have his blood pressure checked at home and if he still persistent high then he should contact his primary care physician.  Recommend to encourage exercise and watches diet and calorie intake.  Patient like to followup in one week hoping that his blood pressure will be normal.  I explained that if blood pressure is normal we will consider increasing his Ritalin on his next appointment.  Recommend counseling but patient refused at this time.  Risk and benefit explain to the patient in detail.  I will continue Lamictal and current dose of Ritalin.  Patient is taking Wellbutrin from his primary care physician.  Safety plan discussed that anytime having active suicidal thoughts or homicidal thoughts and he need to call 911 or go  to local emergency room.  Time spent 25 minutes.  More than 50% of this time spent in psychoeducation counseling and coordination of care.  Portion of this note is generated with voice dictation software and may contain typographical error.  Dakota Vang T., MD 05/27/2012

## 2012-06-03 ENCOUNTER — Encounter (HOSPITAL_COMMUNITY): Payer: Self-pay | Admitting: Psychiatry

## 2012-06-03 ENCOUNTER — Ambulatory Visit (INDEPENDENT_AMBULATORY_CARE_PROVIDER_SITE_OTHER): Payer: 59 | Admitting: Psychiatry

## 2012-06-03 VITALS — BP 108/72 | HR 92 | Wt 242.8 lb

## 2012-06-03 DIAGNOSIS — F988 Other specified behavioral and emotional disorders with onset usually occurring in childhood and adolescence: Secondary | ICD-10-CM

## 2012-06-03 DIAGNOSIS — F39 Unspecified mood [affective] disorder: Secondary | ICD-10-CM

## 2012-06-03 MED ORDER — METHYLPHENIDATE HCL 10 MG PO TABS: ORAL_TABLET | ORAL | Status: DC

## 2012-06-03 NOTE — Progress Notes (Signed)
[facility] [id number] Progress Note  Dakota Vang [id number] 47 y.o.  06/03/2012 1:29 PM  Chief Complaint: Followup.    History of Present Illness:  Patient is 47 year old Caucasian married employed male who came for his followup appointment.  Patient was seen approximately one week ago.  He was complaining of feeling tired and lack of energy.  He wanted to increase his Ritalin however his blood pressure was very high.  Today patient is more lax calm and his blood pressure is normal.  He wants to increase his Ritalin.  He continued to endorse lack of energy feeling tired sleeping too much.  He has less distress from his job.  However he is is still concerned about his weight gain and lack of motivation.  He's not drinking or using any illegal substance.  He denies any paranoia, hallucination or any anger.  Suicidal Ideation: No Plan Formed: No Patient has means to carry out plan: No  Homicidal Ideation: No Plan Formed: No Patient has means to carry out plan: No  Review of Systems: Psychiatric: Agitation: No Hallucination: No Depressed Mood: Yes Insomnia: No Hypersomnia: Yes Altered Concentration: No Feels Worthless: No Grandiose Ideas: No Belief In Special Powers: No New/Increased Substance Abuse: No Compulsions: No  Neurologic: Headache: No Seizure: No Paresthesias: No  Medical History. Patient has multiple medical problems including diverticulitis, hyperlipidemia, hypertension, back pain and GERD.  His primary care physician is Marga Melnick .    Family and Social History: Patient endorse mother has depression and history of psychiatric inpatient treatment.  Patient is born and raised in [place].  He has a good childhood.  He's been living in [place] for past few years.  He's been married for more than 11 years.  He has 3 children.  He has a good social network.  He denies any history of abuse in the past.  Alcohol and substance use history. Patient admitted  history of DWI and drinking alcohol in the past however when to be sober since he is taking Ritalin.  Outpatient Encounter Prescriptions as of 06/03/2012  Medication Sig Dispense Refill  . amLODipine (NORVASC) 5 MG tablet Take 1 tablet (5 mg total) by mouth daily.  90 tablet  3  . buPROPion (WELLBUTRIN XL) 300 MG 24 hr tablet TAKE 1 TABLET DAILY  90 tablet  3  . CRESTOR 20 MG tablet TAKE 1 TABLET DAILY AS DIRECTED  90 tablet  1  . fluticasone (FLONASE) 50 MCG/ACT nasal spray Place 2 sprays into the nose daily. 1-2 sprays each nostril daily *seasonal*  48 g  3  . lamoTRIgine (LAMICTAL) 150 MG tablet 1 Tab BID  180 tablet  0  . methylphenidate (RITALIN) 10 MG tablet 1 in am & 2 at noon  90 tablet  0  . MOVIPREP 100 G SOLR Take as directed.  1 kit  0  . omeprazole (PRILOSEC) 20 MG capsule TAKE 1 CAPSULE DAILY  90 capsule  3  . ramipril (ALTACE) 10 MG capsule Take 1 capsule (10 mg total) by mouth daily.  90 capsule  3  . [DISCONTINUED] methylphenidate (RITALIN) 10 MG tablet 1 in am & noon  60 tablet  0   No facility-administered encounter medications on file as of 06/03/2012.    Past Psychiatric History/Hospitalization(s): Anxiety: Yes Bipolar Disorder: No Depression: Patient has history of depression and mood swing for a long time.  He start taking psychotropic medication by his primary care physician Dr. Marga Melnick.  In the past  she was given Prozac but limited response.  He is taking Wellbutrin XL 300 mg from his primary care physician. Mania: No Psychosis: No Schizophrenia: No Personality Disorder: No Hospitalization for psychiatric illness: No History of Electroconvulsive Shock Therapy: No Prior Suicide Attempts: No  Physical Exam: Constitutional:  BP 108/72  Pulse 92  Wt 242 lb 12.8 oz (110.133 kg)  BMI 32.04 kg/m2  General Appearance: well nourished and obese  Musculoskeletal: Strength & Muscle Tone: within normal limits Gait & Station: normal Patient leans:  N/A  Psychiatric: Speech (describe rate, volume, coherence, spontaneity, and abnormalities if any): clear and coherent with normal tone volume.  Thought Process (describe rate, content, abstract reasoning, and computation): Logical and goal-directed.  No flight of ideas or any loose association.  Associations: Coherent, Relevant and Intact  Thoughts: normal  Mental Status: Orientation: oriented to person, place, time/date and situation Mood & Affect: Tired and affect is mood appropriate. Attention Span & Concentration: Good  Medical Decision Making (Choose Three): Established Problem, Stable/Improving (1), Review of Last Therapy Session (1), Review of Medication Regimen & Side Effects (2) and Review of New Medication or Change in Dosage (2)  Assessment: Axis I: Mood disorder NOS, attention deficit disorder.  Axis II: Deferred  Axis III:  Patient Active Problem List  Diagnosis  . HYPERLIPIDEMIA  . ADD  . HYPERTENSION  . HEMORRHOIDS, INTERNAL  . G E R D  . COLONIC POLYPS, HX OF  . DIVERTICULITIS, HX OF    Axis IV: Mild to moderate  Axis V: 65-70   Plan: I would increase his Ritalin to 10 mg in the morning and 20 mg in afternoon.  He was continue Lamictal at present does.  Reinforce watching calorie intake and regular exercise.  Recommend to call us back if his any question or concern if he feel worsening of the symptom.  Recommend to check his blood pressure regularly.  I will see him again in 4 weeks.  Portion of this note is generated with voice dictation software and may contain typographical error.  Tashema Tiller T., MD 06/03/2012

## 2012-06-10 ENCOUNTER — Ambulatory Visit (INDEPENDENT_AMBULATORY_CARE_PROVIDER_SITE_OTHER): Payer: 59 | Admitting: Internal Medicine

## 2012-06-10 ENCOUNTER — Encounter: Payer: Self-pay | Admitting: Internal Medicine

## 2012-06-10 VITALS — BP 142/98 | HR 105 | Temp 98.4°F | Wt 237.4 lb

## 2012-06-10 DIAGNOSIS — I10 Essential (primary) hypertension: Secondary | ICD-10-CM

## 2012-06-10 MED ORDER — CARVEDILOL 6.25 MG PO TABS: ORAL_TABLET | ORAL | Status: DC

## 2012-06-10 MED ORDER — RAMIPRIL 10 MG PO CAPS: 10.0000 mg | ORAL_CAPSULE | Freq: Every day | ORAL | Status: DC

## 2012-06-10 MED ORDER — FLUTICASONE PROPIONATE 50 MCG/ACT NA SUSP: 2.0000 | Freq: Every day | NASAL | Status: DC

## 2012-06-10 MED ORDER — BUPROPION HCL ER (XL) 300 MG PO TB24: ORAL_TABLET | ORAL | Status: DC

## 2012-06-10 MED ORDER — AMLODIPINE BESYLATE 5 MG PO TABS: 5.0000 mg | ORAL_TABLET | Freq: Every day | ORAL | Status: DC

## 2012-06-10 MED ORDER — OMEPRAZOLE 20 MG PO CPDR: DELAYED_RELEASE_CAPSULE | ORAL | Status: DC

## 2012-06-10 NOTE — Addendum Note (Signed)
Addended by: Maurice Small on: 06/10/2012 04:54 PM   Modules accepted: Orders

## 2012-06-10 NOTE — Progress Notes (Signed)
  Subjective:    Patient ID: Dakota Vang, male    DOB: 1965/08/23, 47 y.o.   MRN: [id number]  HPI  HYPERTENSION follow-up:  Home blood pressure range 145/95-161/114; no average not monitored  Patient is compliant with medications  No adverse effects noted from medication  No exercise program as walking CVE  times per week for minutes  No specific dietary program ; not on  low-salt diet          Review of Systems No chest pain, palpitations, dyspnea, claudication,edema or paroxysmal nocturnal dyspnea described  No significant lightheadedness, headache, epistaxis, or syncope       Objective:   Physical Exam  Appears healthy and well-nourished & in no acute distress  No carotid bruits are present.No neck pain distention present at 10 - 15 degrees. Thyroid normal to palpation  Heart rhythm and rate are normal with no significant murmurs . S4 gallop.  Chest is clear with no increased work of breathing  There is no evidence of aortic aneurysm or renal artery bruits  Abdomen soft with no organomegaly or masses. No HJR  No clubbing, cyanosis or edema present.  Pedal pulses are intact   No ischemic skin changes are present . Nails  Healthy   Alert and oriented. Strength, tone normal        Assessment & Plan:

## 2012-06-10 NOTE — Assessment & Plan Note (Signed)
Add & titrate Carvedilol to max of 25 mg bid

## 2012-06-10 NOTE — Patient Instructions (Addendum)
Minimal Blood Pressure Goal= AVERAGE < 140/90;  Ideal is an AVERAGE < 135/85. This AVERAGE should be calculated from @ least 5-7 BP readings taken @ different times of day on different days of week. You should not respond to isolated BP readings , but rather the AVERAGE for that week .Please bring your  blood pressure cuff to office visits to verify that it is reliable.It  can also be checked against the blood pressure device at the pharmacy. Finger or wrist cuffs are not dependable; an arm cuff is. The carvedilol will be titrated up to a maximum of 25 mg twice a day if the blood pressure  does not average less than 140/90. The increase in dose can be initiated every 5 days by 1/2 pill twice a day f you activate the  My Chart system; lab & Xray results will be released directly  to you as soon as I review & address these through the computer. If you choose not to sign up for My Chart within 36 hours of labs being drawn; results will be reviewed & interpretation added before being copied & mailed, causing a delay in getting the results to you.If you do not receive that report within 7-10 days ,please call. Additionally you can use this system to gain direct  access to your records  if  out of town or @ an office of a  physician who is not in  the My Chart network.  This improves continuity of care & places you in control of your medical record.

## 2012-07-08 ENCOUNTER — Encounter (HOSPITAL_COMMUNITY): Payer: Self-pay | Admitting: Psychiatry

## 2012-07-08 ENCOUNTER — Ambulatory Visit (INDEPENDENT_AMBULATORY_CARE_PROVIDER_SITE_OTHER): Payer: 59 | Admitting: Psychiatry

## 2012-07-08 VITALS — BP 135/98 | HR 78 | Ht 73.0 in | Wt 237.2 lb

## 2012-07-08 DIAGNOSIS — F39 Unspecified mood [affective] disorder: Secondary | ICD-10-CM

## 2012-07-08 DIAGNOSIS — F988 Other specified behavioral and emotional disorders with onset usually occurring in childhood and adolescence: Secondary | ICD-10-CM

## 2012-07-08 MED ORDER — METHYLPHENIDATE HCL 10 MG PO TABS: ORAL_TABLET | ORAL | Status: DC

## 2012-07-08 NOTE — Progress Notes (Signed)
[facility] [id number] Progress Note  Dakota Vang [id number] 47 y.o.  07/08/2012 10:45 AM  Chief Complaint: Followup.    History of Present Illness:  Patient is 47 year old Caucasian married employed male who came for his followup appointment.  He is feeling much improvement with increased dose of Ritalin.  He is more focused and attentive and has increased energy level.  He is able to do more work at work.  He still complaining of more sleep but overall he is feeling better.  He denies any agitation anger or mood swing.  His blood pressure remains high.  He recently seen his primary care physician who added coreg to help his blood pressure.  He has started 2 days ago.  Patient is also requesting to increase his Ritalin since he feels it is working very well.  He is not drinking or using any illegal substance.  He continues to stress about his job but he is handling it better.  He denies any anger paranoia or any hallucination.  He denies any chest pain palpitation or any shortness of breath.  Suicidal Ideation: No Plan Formed: No Patient has means to carry out plan: No  Homicidal Ideation: No Plan Formed: No Patient has means to carry out plan: No  Review of Systems: Psychiatric: Agitation: No Hallucination: No Depressed Mood: No Insomnia: No Hypersomnia: Yes Altered Concentration: No Feels Worthless: No Grandiose Ideas: No Belief In Special Powers: No New/Increased Substance Abuse: No Compulsions: No  Neurologic: Headache: No Seizure: No Paresthesias: No  Medical History. Patient has multiple medical problems including diverticulitis, hyperlipidemia, hypertension, back pain and GERD.  His primary care physician is Marga Melnick .    Family and Social History: Patient endorse mother has depression and history of psychiatric inpatient treatment.  Patient is born and raised in [place].  He has a good childhood.  He's been living in [place] for past few years.  He's  been married for more than 11 years.  He has 3 children.  He has a good social network.  He denies any history of abuse in the past.  Alcohol and substance use history. Patient admitted history of DWI and drinking alcohol in the past however when to be sober since he is taking Ritalin.  Outpatient Encounter Prescriptions as of 07/08/2012  Medication Sig Dispense Refill  . amLODipine (NORVASC) 5 MG tablet Take 1 tablet (5 mg total) by mouth daily.  90 tablet  1  . buPROPion (WELLBUTRIN XL) 300 MG 24 hr tablet TAKE 1 TABLET DAILY  90 tablet  1  . carvedilol (COREG) 6.25 MG tablet Start with 1/2 pill bid.If the blood pressure  does not average less than 140/90 increase every 5 days by 1/2 pill bid.  60 tablet  3  . CRESTOR 20 MG tablet TAKE 1 TABLET DAILY AS DIRECTED  90 tablet  1  . fluticasone (FLONASE) 50 MCG/ACT nasal spray Place 2 sprays into the nose daily. 1-2 sprays each nostril daily *seasonal*  48 g  1  . lamoTRIgine (LAMICTAL) 150 MG tablet 1 Tab BID  180 tablet  0  . methylphenidate (RITALIN) 10 MG tablet 1 in am & 2 at noon  90 tablet  0  . MOVIPREP 100 G SOLR Take as directed.  1 kit  0  . omeprazole (PRILOSEC) 20 MG capsule TAKE 1 CAPSULE DAILY  90 capsule  1  . ramipril (ALTACE) 10 MG capsule Take 1 capsule (10 mg total) by mouth daily.  90  capsule  1  . [DISCONTINUED] methylphenidate (RITALIN) 10 MG tablet 1 in am & 2 at noon  90 tablet  0  . [DISCONTINUED] methylphenidate (RITALIN) 10 MG tablet 1 in am & 2 at noon  90 tablet  0   No facility-administered encounter medications on file as of 07/08/2012.    Past Psychiatric History/Hospitalization(s): Anxiety: Yes Bipolar Disorder: No Depression: Patient has history of depression and mood swing for a long time.  He start taking psychotropic medication by his primary care physician Dr. Marga Melnick.  In the past she was given Prozac but limited response.  He is taking Wellbutrin XL 300 mg from his primary care physician. Mania:  No Psychosis: No Schizophrenia: No Personality Disorder: No Hospitalization for psychiatric illness: No History of Electroconvulsive Shock Therapy: No Prior Suicide Attempts: No  Physical Exam: Constitutional:  BP 135/98  Pulse 78  Ht 6\' 1"  (1.854 m)  Wt 237 lb 3.2 oz (107.593 kg)  BMI 31.3 kg/m2  General Appearance: well nourished and obese  Musculoskeletal: Strength & Muscle Tone: within normal limits Gait & Station: normal Patient leans: N/A  Psychiatric: Speech (describe rate, volume, coherence, spontaneity, and abnormalities if any): clear and coherent with normal tone volume.  Thought Process (describe rate, content, abstract reasoning, and computation): Logical and goal-directed.  No flight of ideas or any loose association.  Associations: Coherent, Relevant and Intact  Thoughts: normal  Mental Status: Orientation: oriented to person, place, time/date and situation Mood & Affect: Tired and affect is mood appropriate. Attention Span & Concentration: Good  Medical Decision Making (Choose Three): Established Problem, Stable/Improving (1), Review of Last Therapy Session (1), Review of Medication Regimen & Side Effects (2) and Review of New Medication or Change in Dosage (2)  Assessment: Axis I: Mood disorder NOS, attention deficit disorder.  Axis II: Deferred  Axis III:  Patient Active Problem List   Diagnosis Date Noted  . DIVERTICULITIS, HX OF 06/24/2008  . ADD 11/20/2007  . COLONIC POLYPS, HX OF 11/20/2007  . HYPERLIPIDEMIA 06/12/2007  . HYPERTENSION 06/12/2007  . HEMORRHOIDS, INTERNAL 11/27/2006  . G E R D 09/19/2006    Axis IV: Mild to moderate  Axis V: 65-70   Plan:  At this time he continues to struggle with his blood pressure.  We will defer increasing any stimulants .  Recommend to continue on current dose of medication .  I will see him again in 2 months.  Recommend to call us back if he has any question concerns or worsening  symptoms  Portion of this note is generated with voice dictation software and may contain typographical error.  Korina Tretter T., MD 07/08/2012

## 2012-08-24 ENCOUNTER — Ambulatory Visit (INDEPENDENT_AMBULATORY_CARE_PROVIDER_SITE_OTHER): Payer: 59 | Admitting: Internal Medicine

## 2012-08-24 ENCOUNTER — Encounter: Payer: Self-pay | Admitting: Internal Medicine

## 2012-08-24 VITALS — BP 128/84 | HR 78 | Temp 98.5°F | Resp 12 | Ht 73.0 in | Wt 226.0 lb

## 2012-08-24 DIAGNOSIS — E785 Hyperlipidemia, unspecified: Secondary | ICD-10-CM

## 2012-08-24 DIAGNOSIS — Z8719 Personal history of other diseases of the digestive system: Secondary | ICD-10-CM

## 2012-08-24 DIAGNOSIS — Z86010 Personal history of colonic polyps: Secondary | ICD-10-CM

## 2012-08-24 DIAGNOSIS — I10 Essential (primary) hypertension: Secondary | ICD-10-CM

## 2012-08-24 LAB — AST: AST: 30 U/L (ref 0–37)

## 2012-08-24 LAB — LIPID PANEL
HDL: 35.7 mg/dL — ABNORMAL LOW (ref >39.00)
VLDL: 46.2 mg/dL — ABNORMAL HIGH (ref 0.0–40.0)

## 2012-08-24 LAB — ALT: ALT: 49 U/L (ref 0–53)

## 2012-08-24 MED ORDER — CARVEDILOL 6.25 MG PO TABS: 6.2500 mg | ORAL_TABLET | Freq: Two times a day (BID) | ORAL | Status: DC

## 2012-08-24 NOTE — Patient Instructions (Addendum)
Minimal Blood Pressure Goal= AVERAGE < 140/90;  Ideal is an AVERAGE < 135/85. This AVERAGE should be calculated from @ least 5-7 BP readings taken @ different times of day on different days of week. You should not respond to isolated BP readings , but rather the AVERAGE for that week .Please bring your  blood pressure cuff to office visits to verify that it is reliable.It  can also be checked against the blood pressure device at the pharmacy. Finger or wrist cuffs are not dependable; an arm cuff is. Records related to 6/1-3 hospitalization will be reviewed.   If you activate the  My Chart system; lab & Xray results will be released directly  to you as soon as I review & address these through the computer. If you choose not to sign up for My Chart within 36 hours of labs being drawn; results will be reviewed & interpretation added before being copied & mailed, causing a delay in getting the results to you.If you do not receive that report within 7-10 days ,please call. Additionally you can use this system to gain direct  access to your records  if  out of town or @ an office of a  physician who is not in  the My Chart network.  This improves continuity of care & places you in control of your medical record.

## 2012-08-24 NOTE — Progress Notes (Signed)
  Subjective:    Patient ID: Dakota Vang, male    DOB: 08/07/1965, 47 y.o.   MRN: [id number]  HPI He was hospitalized 6/1-08/11/12 in [place], [place] with apparent jejunal perforation. This is in the context of prior history of diverticulosis/diverticulitis. He's also had colon polyps removed on 2 occasions in 200 & 2008. His most recent colonoscopy in 2013 was negative however.  He has completed the antibiotics prescribed in [place].It was recommended he have a followup CT scan of his abdomen and pelvis; this was deferred because of this appointment.     Review of Systems  At this time he is asymptomatic. He specifically denies fever, chills, sweats, abdominal pain, melena, rectal bleeding, constipation, or diarrhea.     Objective:   Physical Exam General appearance is one of good health and nourishment w/o distress.  Eyes: No conjunctival inflammation or scleral icterus is present.  Oral exam: Dental hygiene is good; lips and gums are healthy appearing.There is no oropharyngeal erythema or exudate noted. Tongue moist  Heart:  Normal rate and regular rhythm. S1 and S2 normal without gallop, murmur, click, rub or other extra sounds  .S4   Lungs:Chest clear to auscultation; no wheezes, rhonchi,rales ,or rubs present.No increased work of breathing.   Abdomen: bowel sounds normal, soft and non-tender without masses, organomegaly or hernias noted.  No guarding or rebound   There is some asymmetry of the posterior thoracic musculature suggesting occult scoliosis.  Skin:Warm & dry.  Intact without suspicious lesions or rashes ; no jaundice or tenting  Lymphatic: No lymphadenopathy is noted about the head, neck, or axilla            Assessment & Plan:  See Current Assessment & Plan in Problem List under specific Diagnosis

## 2012-08-24 NOTE — Assessment & Plan Note (Addendum)
By history and exam for diverticulitis has resolved. Imaging in and labs from his hospitalization will be requested for review. As he is asymptomatic & colonoscopy is current ; repeat CT of the abdomen and pelvis will be deferred until reports can be reviewed.

## 2012-08-24 NOTE — Assessment & Plan Note (Signed)
He is fasting; lipids will be checked along with TSH. These studies would not have been done during his hospitalization in [place] 6/1-3/14

## 2012-08-24 NOTE — Assessment & Plan Note (Signed)
His blood pressure while hospitalized and again today indicates good control. His blood pressure at home averages 140/93. Asked to bring   blood pressure cuff to office visits to verify that it is reliable.It  can also be checked against the blood pressure device at the pharmacy. Finger or wrist cuffs are not dependable; an arm cuff is . No change in present Carvedilol dose.

## 2012-08-27 ENCOUNTER — Emergency Department (HOSPITAL_COMMUNITY): Payer: 59

## 2012-08-27 ENCOUNTER — Encounter (HOSPITAL_COMMUNITY): Payer: Self-pay | Admitting: Emergency Medicine

## 2012-08-27 ENCOUNTER — Telehealth: Payer: Self-pay | Admitting: Gastroenterology

## 2012-08-27 ENCOUNTER — Inpatient Hospital Stay (HOSPITAL_COMMUNITY)
Admission: EM | Admit: 2012-08-27 | Discharge: 2012-08-30 | DRG: 392 | Disposition: A | Discharge status: Home / Self Care | Payer: 59 | Attending: Internal Medicine | Admitting: Internal Medicine

## 2012-08-27 ENCOUNTER — Telehealth: Payer: Self-pay | Admitting: Internal Medicine

## 2012-08-27 DIAGNOSIS — Z8719 Personal history of other diseases of the digestive system: Secondary | ICD-10-CM

## 2012-08-27 DIAGNOSIS — K5730 Diverticulosis of large intestine without perforation or abscess without bleeding: Secondary | ICD-10-CM | POA: Diagnosis present

## 2012-08-27 DIAGNOSIS — K219 Gastro-esophageal reflux disease without esophagitis: Secondary | ICD-10-CM

## 2012-08-27 DIAGNOSIS — Z860100 Personal history of colon polyps, unspecified: Secondary | ICD-10-CM

## 2012-08-27 DIAGNOSIS — Z880 Allergy status to penicillin: Secondary | ICD-10-CM

## 2012-08-27 DIAGNOSIS — Z8249 Family history of ischemic heart disease and other diseases of the circulatory system: Secondary | ICD-10-CM

## 2012-08-27 DIAGNOSIS — I10 Essential (primary) hypertension: Secondary | ICD-10-CM

## 2012-08-27 DIAGNOSIS — F988 Other specified behavioral and emotional disorders with onset usually occurring in childhood and adolescence: Secondary | ICD-10-CM

## 2012-08-27 DIAGNOSIS — F411 Generalized anxiety disorder: Secondary | ICD-10-CM | POA: Diagnosis present

## 2012-08-27 DIAGNOSIS — K5752 Diverticulitis of both small and large intestine without perforation or abscess without bleeding: Secondary | ICD-10-CM

## 2012-08-27 DIAGNOSIS — E785 Hyperlipidemia, unspecified: Secondary | ICD-10-CM

## 2012-08-27 DIAGNOSIS — D72829 Elevated white blood cell count, unspecified: Secondary | ICD-10-CM

## 2012-08-27 DIAGNOSIS — K5712 Diverticulitis of small intestine without perforation or abscess without bleeding: Principal | ICD-10-CM

## 2012-08-27 DIAGNOSIS — F17200 Nicotine dependence, unspecified, uncomplicated: Secondary | ICD-10-CM | POA: Diagnosis present

## 2012-08-27 DIAGNOSIS — K648 Other hemorrhoids: Secondary | ICD-10-CM

## 2012-08-27 DIAGNOSIS — Z79899 Other long term (current) drug therapy: Secondary | ICD-10-CM

## 2012-08-27 DIAGNOSIS — K5710 Diverticulosis of small intestine without perforation or abscess without bleeding: Secondary | ICD-10-CM

## 2012-08-27 DIAGNOSIS — Z800 Family history of malignant neoplasm of digestive organs: Secondary | ICD-10-CM

## 2012-08-27 DIAGNOSIS — K5732 Diverticulitis of large intestine without perforation or abscess without bleeding: Secondary | ICD-10-CM

## 2012-08-27 DIAGNOSIS — Z801 Family history of malignant neoplasm of trachea, bronchus and lung: Secondary | ICD-10-CM

## 2012-08-27 DIAGNOSIS — F909 Attention-deficit hyperactivity disorder, unspecified type: Secondary | ICD-10-CM | POA: Diagnosis present

## 2012-08-27 DIAGNOSIS — K5900 Constipation, unspecified: Secondary | ICD-10-CM | POA: Diagnosis present

## 2012-08-27 HISTORY — DX: Essential (primary) hypertension: I10

## 2012-08-27 LAB — URINALYSIS, ROUTINE W REFLEX MICROSCOPIC
Bilirubin Urine: NEGATIVE
Hgb urine dipstick: NEGATIVE
Ketones, ur: NEGATIVE mg/dL
Specific Gravity, Urine: 1.024 (ref 1.005–1.030)
pH: 5.5 (ref 5.0–8.0)

## 2012-08-27 LAB — POCT I-STAT, CHEM 8
BUN: 12 mg/dL (ref 6–23)
Chloride: 107 mEq/L (ref 96–112)
Creatinine, Ser: 0.9 mg/dL (ref 0.50–1.35)
Glucose, Bld: 91 mg/dL (ref 70–99)
Potassium: 4.3 mEq/L (ref 3.5–5.1)
Sodium: 141 mEq/L (ref 135–145)

## 2012-08-27 LAB — CBC WITH DIFFERENTIAL/PLATELET
Eosinophils Absolute: 0.2 10*3/uL (ref 0.0–0.7)
Eosinophils Relative: 1 % (ref 0–5)
Hemoglobin: 15.4 g/dL (ref 13.0–17.0)
Lymphocytes Relative: 18 % (ref 12–46)
Lymphs Abs: 2.7 10*3/uL (ref 0.7–4.0)
MCH: 30.2 pg (ref 26.0–34.0)
MCV: 85.1 fL (ref 78.0–100.0)
Monocytes Relative: 7 % (ref 3–12)
Platelets: 243 10*3/uL (ref 150–400)
RBC: 5.1 MIL/uL (ref 4.22–5.81)
WBC: 15.3 10*3/uL — ABNORMAL HIGH (ref 4.0–10.5)

## 2012-08-27 LAB — HEPATIC FUNCTION PANEL
ALT: 35 U/L (ref 0–53)
AST: 16 U/L (ref 0–37)
Alkaline Phosphatase: 70 U/L (ref 39–117)
Indirect Bilirubin: 0.3 mg/dL (ref 0.3–0.9)
Total Protein: 7 g/dL (ref 6.0–8.3)

## 2012-08-27 LAB — LIPASE, BLOOD: Lipase: 30 U/L (ref 11–59)

## 2012-08-27 MED ORDER — IOHEXOL 300 MG/ML  SOLN
25.0000 mL | INTRAMUSCULAR | Status: AC
  Administered 2012-08-27: 25 mL via ORAL

## 2012-08-27 MED ORDER — SODIUM CHLORIDE 0.9 % IV SOLN: 3.0000 g | Freq: Once | INTRAVENOUS | Status: DC

## 2012-08-27 MED ORDER — PROBIOTIC DAILY PO CAPS: 1.0000 | ORAL_CAPSULE | Freq: Every day | ORAL | Status: DC

## 2012-08-27 MED ORDER — HYDROMORPHONE HCL PF 1 MG/ML IJ SOLN
1.0000 mg | INTRAMUSCULAR | Status: DC | PRN
  Administered 2012-08-27: 1 mg via INTRAVENOUS
  Filled 2012-08-27: qty 1

## 2012-08-27 MED ORDER — SODIUM CHLORIDE 0.9 % IV BOLUS (SEPSIS)
1000.0000 mL | Freq: Once | INTRAVENOUS | Status: AC
  Administered 2012-08-27: 1000 mL via INTRAVENOUS

## 2012-08-27 MED ORDER — AMLODIPINE BESYLATE 5 MG PO TABS
5.0000 mg | ORAL_TABLET | Freq: Every day | ORAL | Status: DC
  Administered 2012-08-28 – 2012-08-30 (×3): 5 mg via ORAL
  Filled 2012-08-27 (×3): qty 1

## 2012-08-27 MED ORDER — SODIUM CHLORIDE 0.9 % IV SOLN: INTRAVENOUS | Status: DC

## 2012-08-27 MED ORDER — ONDANSETRON HCL 4 MG/2ML IJ SOLN
4.0000 mg | Freq: Once | INTRAMUSCULAR | Status: AC
  Administered 2012-08-27: 4 mg via INTRAVENOUS
  Filled 2012-08-27: qty 2

## 2012-08-27 MED ORDER — SODIUM CHLORIDE 0.9 % IV SOLN
1.0000 g | INTRAVENOUS | Status: DC
  Administered 2012-08-27 – 2012-08-29 (×3): 1 g via INTRAVENOUS
  Filled 2012-08-27 (×4): qty 1

## 2012-08-27 MED ORDER — HYDROMORPHONE HCL PF 1 MG/ML IJ SOLN
1.0000 mg | INTRAMUSCULAR | Status: DC | PRN
  Administered 2012-08-28 (×2): 1 mg via INTRAVENOUS
  Filled 2012-08-27 (×2): qty 1

## 2012-08-27 MED ORDER — PANTOPRAZOLE SODIUM 40 MG PO TBEC
40.0000 mg | DELAYED_RELEASE_TABLET | Freq: Every day | ORAL | Status: DC
  Administered 2012-08-28 – 2012-08-30 (×3): 40 mg via ORAL
  Filled 2012-08-27 (×2): qty 1

## 2012-08-27 MED ORDER — METHYLPHENIDATE HCL 5 MG PO TABS
5.0000 mg | ORAL_TABLET | Freq: Two times a day (BID) | ORAL | Status: DC
  Administered 2012-08-28 (×2): 5 mg via ORAL
  Filled 2012-08-27 (×2): qty 1

## 2012-08-27 MED ORDER — SACCHAROMYCES BOULARDII 250 MG PO CAPS
250.0000 mg | ORAL_CAPSULE | Freq: Every day | ORAL | Status: DC
  Administered 2012-08-28 – 2012-08-30 (×3): 250 mg via ORAL
  Filled 2012-08-27 (×3): qty 1

## 2012-08-27 MED ORDER — ONDANSETRON HCL 4 MG/2ML IJ SOLN: 4.0000 mg | Freq: Three times a day (TID) | INTRAMUSCULAR | Status: DC | PRN

## 2012-08-27 MED ORDER — SODIUM CHLORIDE 0.9 % IV SOLN
1.0000 g | Freq: Once | INTRAVENOUS | Status: DC
  Filled 2012-08-27: qty 1

## 2012-08-27 MED ORDER — CARVEDILOL 6.25 MG PO TABS
6.2500 mg | ORAL_TABLET | Freq: Two times a day (BID) | ORAL | Status: DC
  Administered 2012-08-28 – 2012-08-30 (×5): 6.25 mg via ORAL
  Filled 2012-08-27 (×8): qty 1

## 2012-08-27 MED ORDER — ACETAMINOPHEN 650 MG RE SUPP: 650.0000 mg | Freq: Four times a day (QID) | RECTAL | Status: DC | PRN

## 2012-08-27 MED ORDER — LAMOTRIGINE 150 MG PO TABS
150.0000 mg | ORAL_TABLET | Freq: Two times a day (BID) | ORAL | Status: DC
  Administered 2012-08-28 – 2012-08-30 (×6): 150 mg via ORAL
  Filled 2012-08-27 (×8): qty 1

## 2012-08-27 MED ORDER — RAMIPRIL 10 MG PO CAPS
10.0000 mg | ORAL_CAPSULE | Freq: Every day | ORAL | Status: DC
  Administered 2012-08-28 – 2012-08-30 (×3): 10 mg via ORAL
  Filled 2012-08-27 (×3): qty 1

## 2012-08-27 MED ORDER — HYDROMORPHONE HCL PF 1 MG/ML IJ SOLN
1.0000 mg | Freq: Once | INTRAMUSCULAR | Status: AC
  Administered 2012-08-27: 1 mg via INTRAVENOUS
  Filled 2012-08-27: qty 1

## 2012-08-27 MED ORDER — BUPROPION HCL ER (XL) 300 MG PO TB24
300.0000 mg | ORAL_TABLET | Freq: Every day | ORAL | Status: DC
Start: 2012-08-28 — End: 2012-08-30
  Administered 2012-08-28 – 2012-08-30 (×3): 300 mg via ORAL
  Filled 2012-08-27 (×3): qty 1

## 2012-08-27 MED ORDER — HYDROMORPHONE HCL PF 1 MG/ML IJ SOLN: 1.0000 mg | INTRAMUSCULAR | Status: DC | PRN

## 2012-08-27 MED ORDER — ONDANSETRON HCL 4 MG/2ML IJ SOLN
4.0000 mg | Freq: Four times a day (QID) | INTRAMUSCULAR | Status: AC | PRN
  Administered 2012-08-28: 4 mg via INTRAVENOUS
  Filled 2012-08-27: qty 2

## 2012-08-27 MED ORDER — ACETAMINOPHEN 325 MG PO TABS
650.0000 mg | ORAL_TABLET | Freq: Four times a day (QID) | ORAL | Status: DC | PRN
  Administered 2012-08-27: 650 mg via ORAL
  Filled 2012-08-27: qty 2

## 2012-08-27 MED ORDER — POLYETHYLENE GLYCOL 3350 17 G PO PACK: 17.0000 g | PACK | Freq: Two times a day (BID) | ORAL | Status: DC

## 2012-08-27 MED ORDER — ATORVASTATIN CALCIUM 40 MG PO TABS
40.0000 mg | ORAL_TABLET | Freq: Every day | ORAL | Status: DC
  Filled 2012-08-27: qty 1

## 2012-08-27 MED ORDER — POLYETHYLENE GLYCOL 3350 17 G PO PACK
17.0000 g | PACK | Freq: Every day | ORAL | Status: DC
  Administered 2012-08-27: 17 g via ORAL
  Filled 2012-08-27: qty 1

## 2012-08-27 MED ORDER — SODIUM CHLORIDE 0.9 % IV SOLN
1.0000 g | Freq: Once | INTRAVENOUS | Status: AC
  Administered 2012-08-27: 1 g via INTRAVENOUS
  Filled 2012-08-27: qty 1

## 2012-08-27 NOTE — H&P (Signed)
[facility] History and Physical  Dakota Vang ION:[id number] DOB: 09-07-1965 DOA: 08/27/2012  Referring physician: Greta Doom, PA PCP: Marga Melnick, MD  Specialists: Dr. Anselm Jungling [facility])  Chief Complaint: abdominal pain, nausea  HPI: Dakota Vang is a 47 y.o. male with pmh significant for GERD, diverticulosis (with recent admission for diverticulitis at the beginning of the month in [place]), HTN, HLD, ADD and anxiety; came to ED complaining of abdominal pain and nausea for the las 2 days. Patient reports pain similar to previous admission for diverticulitis. Endorses subjective fever, but no chills, he denies CP, vomiting, cough, hematochezia, melena or any other complaints. In ED CT abdomen demonstrated inflammation involving jejunal diverticulitis and microperforation, WBC's > 15,000.  [facility] called to admit patient for further evaluation and treatment.   Review of Systems:  Negative except as mentioned on HPI  Past Medical History  Diagnosis Date  . GERD (gastroesophageal reflux disease)   . Colon polyps 2008    Dr Russella Dar  . Diverticulosis 2002    Dr Corinda Gubler  . Hyperlipidemia   . Diverticulitis     PMH of  . ADD (attention deficit disorder with hyperactivity)     Dr. Minta Balsam  . Anxiety     Dr. Minta Balsam  . Hypertension    Past Surgical History  Procedure Laterality Date  . Tonsillectomy    . Lasik      bilaterally  . Colonoscopy w/ polypectomy  2002 & 2008    Dr. Russella Dar; polyps X 2  . Colonoscopy  2013    Dr Russella Dar   Social History:  reports that he has been smoking Cigarettes.  He has been smoking about 0.00 packs per day. He has never used smokeless tobacco. He reports that he drinks about 3.6 ounces of alcohol per week. He reports that he does not use illicit drugs. Lives at home with wife and denies assistance with ADL's   Allergies  Allergen Reactions  . Penicillins Other (See Comments)    ? Reaction @ 47 yrs old    Family History  Problem Relation Age  of Onset  . Colon cancer Father     in 61s  . Lung cancer Father     smoker; asbestos exposure  . Cancer Father     Mesothelioma  . Coronary artery disease Mother     CAGB X 2 & angio 3-4X  . Cancer Mother     Gallbladder  . Coronary artery disease Maternal Grandmother   . COPD Maternal Grandmother   . Heart attack Maternal Grandfather     late 60's  . Heart attack Paternal Grandfather 72  . Diabetes Neg Hx   . Stroke Neg Hx     Prior to Admission medications   Medication Sig Start Date End Date Taking? Authorizing Provider  amLODipine (NORVASC) 5 MG tablet Take 1 tablet (5 mg total) by mouth daily. 06/10/12  Yes Pecola Lawless, MD  aspirin 325 MG tablet Take 325 mg by mouth daily.   Yes Historical Provider, MD  buPROPion (WELLBUTRIN XL) 300 MG 24 hr tablet Take 300 mg by mouth daily.   Yes Historical Provider, MD  carvedilol (COREG) 6.25 MG tablet Take 1 tablet (6.25 mg total) by mouth 2 (two) times daily with a meal. 1 by mouth twice daily 08/24/12  Yes Pecola Lawless, MD  fluticasone [facility]) 50 MCG/ACT nasal spray Place 2 sprays into the nose daily. 1-2 sprays each nostril daily *seasonal* 06/10/12  Yes Pecola Lawless,  MD  lamoTRIgine (LAMICTAL) 150 MG tablet Take 150 mg by mouth 2 (two) times daily.   Yes Historical Provider, MD  methylphenidate (RITALIN) 10 MG tablet 1 in am & 2 at noon 07/08/12  Yes Cleotis Nipper, MD  Multiple Vitamins-Minerals (MULTIVITAMIN WITH MINERALS) tablet Take 1 tablet by mouth daily.   Yes Historical Provider, MD  omeprazole (PRILOSEC) 20 MG capsule Take 20 mg by mouth daily.   Yes Historical Provider, MD  Probiotic Product (PROBIOTIC DAILY PO) Take 1 tablet by mouth daily.   Yes Historical Provider, MD  ramipril (ALTACE) 10 MG capsule Take 1 capsule (10 mg total) by mouth daily. 06/10/12  Yes Pecola Lawless, MD  rosuvastatin (CRESTOR) 20 MG tablet Take 20 mg by mouth daily. Take on Sunday Monday Wednesday and Friday.   Yes Historical Provider, MD    Physical Exam: Filed Vitals:   08/27/12 1415 08/27/12 1600 08/27/12 1615 08/27/12 1630  BP: 149/88 132/88 119/85 134/78  Pulse: 101 114 108 99  Temp:      Resp:      SpO2: 98% 97% 97% 97%     General:  NAD, currently afebrile; complaining of abd pain and nausea  Eyes: PERRL, no icterus, no nystagmus, EOMI  ENT: no erythema, no exudates, good dentition, no drainage out of ears or nostrils  Neck: supple, no thyromegaly, no bruits  Cardiovascular: S1 and S2, no rubs or gallops  Respiratory: CTA bilaterally  Abdomen: soft, tender to palpation mid abdomen and LLQ, no distension, positive BS  Skin: no open wounds, no rash or petechiae  Musculoskeletal: no joint swelling or erythema  Psychiatric: appropriate affect  Neurologic: CN intact, no focal motor or sensory deificit; MS 5/5 bilaterally  Labs on Admission:  Basic Metabolic Panel:  Recent Labs Lab 08/27/12 1535  NA 141  K 4.3  CL 107  GLUCOSE 91  BUN 12  CREATININE 0.90   Liver Function Tests:  Recent Labs Lab 08/24/12 1024 08/27/12 1346  AST 30 16  ALT 49 35  ALKPHOS  --  70  BILITOT  --  0.4  PROT  --  7.0  ALBUMIN  --  4.1    Recent Labs Lab 08/27/12 1346  LIPASE 30   CBC:  Recent Labs Lab 08/27/12 1346 08/27/12 1535  WBC 15.3*  --   NEUTROABS 11.4*  --   HGB 15.4 14.3  HCT 43.4 42.0  MCV 85.1  --   PLT 243  --     Radiological Exams on Admission: Ct Abdomen Pelvis Wo Contrast  08/27/2012   *RADIOLOGY REPORT*  Clinical Data: Epigastric pain, nausea.  Recent diverticulitis.  CT ABDOMEN AND PELVIS WITHOUT CONTRAST  Technique:  Multidetector CT imaging of the abdomen and pelvis was performed following the standard protocol without intravenous contrast.  Comparison: Plain films earlier today.  CT 10/01/2003.  Findings: Lung bases are clear.  No effusions.  Heart is normal size.  Liver, gallbladder, spleen, pancreas, adrenals and kidneys have an unremarkable unenhanced appearance.   Marked inflammatory process noted within the jejunal small bowel mesentery with associated wall thickening in multiple loops of jejunum.  There is a large jejunal diverticulum noted on image 40 of series 2.  On the same image, there appears to be a small locule of extraluminal gas within the inflamed mesentery.  Findings most likely represent a jejunal diverticulitis.  There are a few scattered small reactive mesenteric lymph nodes in this area.  Descending colonic and sigmoid diverticulosis.  Wall thickening in the sigmoid colon compatible with diverticulosis.  No evidence of colonic diverticulitis.  Small amount of free fluid in the pelvis.  Appendix is visualized and is normal.  4.3 cm duodenal diverticulum adjacent to the second and third portions of the duodenum.  Stomach is grossly unremarkable.  IMPRESSION: Marked inflammatory process involving several jejunal small bowel loops and the jejunal mesentery.  There appears to be a large jejunal diverticulum in this area and findings most likely reflect a jejunal diverticulitis.  I believe I see at least one small locule of extraluminal gas suggesting micro perforation.  Adjacent reactive mesenteric lymph nodes.  These results were called to Dr. Jeraldine Loots at the time of interpretation.   Original Report Authenticated By: Charlett Nose, M.D.   Dg Abd Acute W/chest  08/27/2012   *RADIOLOGY REPORT*  Clinical Data: Rule out free air  ACUTE ABDOMEN SERIES (ABDOMEN 2 VIEW & CHEST 1 VIEW)  Comparison: 04/24/2010  Findings: Normal heart size.  There is no pleural effusion or edema.  No airspace consolidation identified.  Moderate stool burden identified within the colon.  No dilated loops of small bowel or air-fluid levels identified.  No free intraperitoneal air is identified.  No abnormal abdominal or pelvic calcifications.  IMPRESSION:  1.  No acute abnormality noted. 2.  Moderate stool burden within the colon.   Original Report Authenticated By: Signa Kell, M.D.     Assessment/Plan 1-Abdominal  pain, other specified site: mid abd and LLQ; base on physical exam history and CT findings concerns are for diverticulitis (recurrent) and enteritis (potentially C. Diff; patient described episode of loose stools while taking antibiotics therapy recently). -will admit to med-surg bed for supportive care, CLD, antiemetics and PRN analgesics -IV invanz (patient allergic to [facility] and recently was treated with cipro and flagyl) -will check C. Diff by PCR -repeat abd x-ray in am to follow on microperforation  -depending findings and clinical evolution might need surgical consultation.  2-HYPERLIPIDEMIA: continue statins.  3-HYPERTENSION: stable and well controlled. Will continue current medications.  4-G E R D: continue PPI for now; if patient positive for C. Diff will need to change PPI for H2 blockers  5-DIVERTICULITIS, HX OF: recently treated with cipro and flagyl; CT still demonstrating inflammation and microperforation. -will treat as mentioned above -at discharge will need guide for low residue diet  6-Leukocytosis: due to #1; will continue empiric antibiotics using invanz; will follow CBC in am  7-Constipation: started on miralax BID.  8-ADD and anxiety: stable. Continue home medications.  DVT: SCD's and early ambulation.   Code Status: Full Family Communication: wife at bedside Disposition Plan: admit to med-surg bed, inpatient status and with LOS anticipation > 2 midnights  Time spent: 50 minutes  Jes Costales [facility] Pager [phone]  If 7PM-7AM, please contact night-coverage [email or web address] Password [facility] 08/27/2012, 6:56 PM

## 2012-08-27 NOTE — ED Provider Notes (Signed)
  This was a shared visit with a mid-level provided (NP or PA).  Throughout the patient's course I was available for consultation/collaboration. On my initial exam, and on multiple repeat exams, he was uncomfortable appearing, though in no distress, hemodynamically stable. We discussed CT results, which I had previously spoken with our radiologist about, and he was admitted for further evaluation and management I saw the relevant labs and studies - I agree with the interpretation.     Gerhard Munch, MD 08/27/12 1929

## 2012-08-27 NOTE — ED Provider Notes (Signed)
History     CSN: [id number]  Arrival date & time 08/27/12  1325   First MD Initiated Contact with Patient 08/27/12 1332      Chief Complaint  Patient presents with  . Abdominal Pain    (Consider location/radiation/quality/duration/timing/severity/associated sxs/prior treatment) HPI  47 year old male history of diverticulosis, presents complaining of abdominal pain. Patient reports he was diagnosed with diverticulitis 2 and half weeks ago with microperforation to jejunum on CT scan which was performed at an outside hospital.  He was hospitalized for 3 days was discharge with Cipro and Flagyl. He finished his antibiotics since yesterday. Patient started developing acute onset of sharp stabbing pain 2 days ago, worsening this morning, locate in his mid abdomen, 6/10, worsening with palpation. Endorse nausea without vomiting or diarrhea. Denies fever, chills, chest pain, shortness of breath, dysuria, or rash. Denies any hematochezia, or melena.  Past Medical History  Diagnosis Date  . GERD (gastroesophageal reflux disease)   . Colon polyps 2008    Dr Russella Dar  . Diverticulosis 2002    Dr Corinda Gubler  . Hyperlipidemia   . Diverticulitis     PMH of  . ADD (attention deficit disorder with hyperactivity)     Dr. Minta Balsam  . Anxiety     Dr. Minta Balsam    Past Surgical History  Procedure Laterality Date  . Tonsillectomy    . Lasik      bilaterally  . Colonoscopy w/ polypectomy  2002 & 2008    Dr. Russella Dar; polyps X 2  . Colonoscopy  2013    Dr Russella Dar    Family History  Problem Relation Age of Onset  . Colon cancer Father     in 72s  . Lung cancer Father     smoker; asbestos exposure  . Cancer Father     Mesothelioma  . Coronary artery disease Mother     CAGB X 2 & angio 3-4X  . Cancer Mother     Gallbladder  . Coronary artery disease Maternal Grandmother   . COPD Maternal Grandmother   . Heart attack Maternal Grandfather     late 60's  . Heart attack Paternal Grandfather 52  .  Diabetes Neg Hx   . Stroke Neg Hx     History  Substance Use Topics  . Smoking status: Current Some Day Smoker    Types: Cigarettes  . Smokeless tobacco: Never Used     Comment: 1 cigar / month; previously up to 1/2 ppd for 5 years  . Alcohol Use: 3.6 - 4.8 oz/week    6-8 Cans of beer per week     Comment: Beer on weekend       Review of Systems  All other systems reviewed and are negative.    Allergies  Penicillins  Home Medications   Current Outpatient Rx  Name  Route  Sig  Dispense  Refill  . amLODipine (NORVASC) 5 MG tablet   Oral   Take 1 tablet (5 mg total) by mouth daily.   90 tablet   1   . buPROPion (WELLBUTRIN XL) 300 MG 24 hr tablet      TAKE 1 TABLET DAILY   90 tablet   1   . carvedilol (COREG) 6.25 MG tablet   Oral   Take 1 tablet (6.25 mg total) by mouth 2 (two) times daily with a meal. 1 by mouth twice daily   180 tablet   1   . CRESTOR 20 MG tablet  TAKE 1 TABLET DAILY AS DIRECTED   90 tablet   1   . fluticasone (FLONASE) 50 MCG/ACT nasal spray   Nasal   Place 2 sprays into the nose daily. 1-2 sprays each nostril daily *seasonal*   48 g   1   . lamoTRIgine (LAMICTAL) 150 MG tablet      1 Tab BID   180 tablet   0   . methylphenidate (RITALIN) 10 MG tablet      1 in am & 2 at noon   90 tablet   0     No early refill   . omeprazole (PRILOSEC) 20 MG capsule      TAKE 1 CAPSULE DAILY   90 capsule   1   . ramipril (ALTACE) 10 MG capsule   Oral   Take 1 capsule (10 mg total) by mouth daily.   90 capsule   1     BP 141/96  Pulse 117  Temp(Src) 98.7 F (37.1 C)  Resp 16  SpO2 98%  Physical Exam  Nursing note and vitals reviewed. Constitutional: He is oriented to person, place, and time. He appears well-developed and well-nourished. He appears distressed (uncomfortable appearing, but nontoxic).  HENT:  Head: Atraumatic.  Eyes: Conjunctivae are normal.  Neck: Neck supple.  Cardiovascular:  Tachycardia with  M/R/G.  Pulmonary/Chest: Effort normal and breath sounds normal.  Abdominal: Soft. He exhibits no distension. There is tenderness (mid abdomen tenderness with guarding but without rebound tenderness.  No hernia or overlying skin changes noted.  no abdominal pulsatile mass).  Musculoskeletal: Normal range of motion. He exhibits no edema.  Neurological: He is alert and oriented to person, place, and time.  Skin: Skin is warm. No rash noted.  Psychiatric: He has a normal mood and affect.    ED Course  Procedures (including critical care time)  1:51 PM Hx of recent diagnosed with diverticulitis, now having recurrent pain.  Concerning for possible perforation.  Will xray, check labs, pain control.  May consider repeat CT scan.  Care discussed with attending.  Pt otherwise afebrile, is tachycardic but hemodynamically stable.    3:17 PM X-ray without acute abnormalities. There is moderate stool burden within the colon. Patient reports he has not had a bowel movement in 3 days. Will give MiraLAX here. Will obtain a CT scan of his abdomen pelvis, noncontrast. Continues to endorse pain, pain medication given. Patient has leukocytosis of 15.3.  5:27 PM CT shows marked inflammatory process involving several jenunal small bowel loops and jejunal mesentery suggestive of diverticulitis with micro perforation.  Since pt fail outpt treatment, will start ertapenem and will admit pt.    I have consulted with [facility], Dr. Gwenlyn Perking who agrees to admit pt to team 8, med surg, under his care.  Pt aware of plan.    Labs Reviewed  CBC WITH DIFFERENTIAL - Abnormal; Notable for the following:    WBC 15.3 (*)    Neutro Abs 11.4 (*)    All other components within normal limits  LIPASE, BLOOD  HEPATIC FUNCTION PANEL  URINALYSIS, ROUTINE W REFLEX MICROSCOPIC  CG4 I-STAT (LACTIC ACID)  POCT I-STAT, CHEM 8   Ct Abdomen Pelvis Wo Contrast  08/27/2012   *RADIOLOGY REPORT*  Clinical Data: Epigastric pain,  nausea.  Recent diverticulitis.  CT ABDOMEN AND PELVIS WITHOUT CONTRAST  Technique:  Multidetector CT imaging of the abdomen and pelvis was performed following the standard protocol without intravenous contrast.  Comparison: Plain films earlier today.  CT 10/01/2003.  Findings: Lung bases are clear.  No effusions.  Heart is normal size.  Liver, gallbladder, spleen, pancreas, adrenals and kidneys have an unremarkable unenhanced appearance.  Marked inflammatory process noted within the jejunal small bowel mesentery with associated wall thickening in multiple loops of jejunum.  There is a large jejunal diverticulum noted on image 40 of series 2.  On the same image, there appears to be a small locule of extraluminal gas within the inflamed mesentery.  Findings most likely represent a jejunal diverticulitis.  There are a few scattered small reactive mesenteric lymph nodes in this area.  Descending colonic and sigmoid diverticulosis.  Wall thickening in the sigmoid colon compatible with diverticulosis.  No evidence of colonic diverticulitis.  Small amount of free fluid in the pelvis.  Appendix is visualized and is normal.  4.3 cm duodenal diverticulum adjacent to the second and third portions of the duodenum.  Stomach is grossly unremarkable.  IMPRESSION: Marked inflammatory process involving several jejunal small bowel loops and the jejunal mesentery.  There appears to be a large jejunal diverticulum in this area and findings most likely reflect a jejunal diverticulitis.  I believe I see at least one small locule of extraluminal gas suggesting micro perforation.  Adjacent reactive mesenteric lymph nodes.  These results were called to Dr. Jeraldine Loots at the time of interpretation.   Original Report Authenticated By: Charlett Nose, M.D.   Dg Abd Acute W/chest  08/27/2012   *RADIOLOGY REPORT*  Clinical Data: Rule out free air  ACUTE ABDOMEN SERIES (ABDOMEN 2 VIEW & CHEST 1 VIEW)  Comparison: 04/24/2010  Findings: Normal heart  size.  There is no pleural effusion or edema.  No airspace consolidation identified.  Moderate stool burden identified within the colon.  No dilated loops of small bowel or air-fluid levels identified.  No free intraperitoneal air is identified.  No abnormal abdominal or pelvic calcifications.  IMPRESSION:  1.  No acute abnormality noted. 2.  Moderate stool burden within the colon.   Original Report Authenticated By: Signa Kell, M.D.     1. Diverticulitis of both small and large intestine without perforation or abscess without bleeding   2. Constipation       MDM  BP 134/78  Pulse 99  Temp(Src) 98.7 F (37.1 C)  Resp 16  SpO2 97%  I have reviewed nursing notes and vital signs. I personally reviewed the imaging tests through PACS system  I reviewed available ER/hospitalization records thought the EMR         Fayrene Helper, [place] 08/27/12 1731

## 2012-08-27 NOTE — Telephone Encounter (Signed)
Discussed with Mike Gip PA patient needs ER evaluation.  Patient is notified.  He is instructed to be NPO until he is seen at the ER.  Patient reports he is in [place], [place] and will head toward [facility] ER

## 2012-08-27 NOTE — ED Notes (Signed)
Called report to unit [id number].

## 2012-08-27 NOTE — ED Notes (Signed)
States was dx w/ diverticulitis and  Has started to have sharp abd  Pain x 3 days

## 2012-08-27 NOTE — Telephone Encounter (Signed)
Patient was recently hospitalized in [place] for a jejunal "Microperforation".  He was treated with antibiotics and bowel rest and his symptoms resolved.  Records from recent hospital stay are not available in [facility].  He reports that the same pain started last night.  The pain is epigastric, he has nausea, and feels "I think I just broke out with fever".  Patient is requesting an office visit today for the same symptoms.  He does not want to go to the ER.  I have advised that I will discuss with the PA and call him back with instructions.

## 2012-08-28 ENCOUNTER — Inpatient Hospital Stay (HOSPITAL_COMMUNITY): Payer: 59

## 2012-08-28 ENCOUNTER — Encounter (HOSPITAL_COMMUNITY): Payer: Self-pay | Admitting: General Surgery

## 2012-08-28 DIAGNOSIS — K5710 Diverticulosis of small intestine without perforation or abscess without bleeding: Secondary | ICD-10-CM

## 2012-08-28 DIAGNOSIS — K5732 Diverticulitis of large intestine without perforation or abscess without bleeding: Secondary | ICD-10-CM

## 2012-08-28 DIAGNOSIS — K5712 Diverticulitis of small intestine without perforation or abscess without bleeding: Secondary | ICD-10-CM | POA: Diagnosis present

## 2012-08-28 HISTORY — DX: Diverticulitis of small intestine without perforation or abscess without bleeding: K57.12

## 2012-08-28 HISTORY — DX: Diverticulosis of small intestine without perforation or abscess without bleeding: K57.10

## 2012-08-28 LAB — BASIC METABOLIC PANEL
BUN: 10 mg/dL (ref 6–23)
Chloride: 100 mEq/L (ref 96–112)
GFR calc Af Amer: >90 mL/min (ref >90)
GFR calc non Af Amer: >90 mL/min (ref >90)
Glucose, Bld: 102 mg/dL — ABNORMAL HIGH (ref 70–99)
Potassium: 4.1 mEq/L (ref 3.5–5.1)
Sodium: 135 mEq/L (ref 135–145)

## 2012-08-28 LAB — CBC
HCT: 39.9 % (ref 39.0–52.0)
Hemoglobin: 13.4 g/dL (ref 13.0–17.0)
MCH: 29.1 pg (ref 26.0–34.0)
RBC: 4.6 MIL/uL (ref 4.22–5.81)

## 2012-08-28 MED ORDER — METHYLPHENIDATE HCL 5 MG PO TABS: 10.0000 mg | ORAL_TABLET | Freq: Every morning | ORAL | Status: DC

## 2012-08-28 MED ORDER — FLEET ENEMA 7-19 GM/118ML RE ENEM
1.0000 | ENEMA | Freq: Once | RECTAL | Status: DC
Start: 2012-08-28 — End: 2012-08-28
  Filled 2012-08-28: qty 1

## 2012-08-28 MED ORDER — PHENOL 1.4 % MT LIQD
2.0000 | OROMUCOSAL | Status: DC | PRN
  Filled 2012-08-28: qty 177

## 2012-08-28 MED ORDER — ALUM & MAG HYDROXIDE-SIMETH 200-200-20 MG/5ML PO SUSP
30.0000 mL | Freq: Four times a day (QID) | ORAL | Status: DC | PRN
  Filled 2012-08-28: qty 30

## 2012-08-28 MED ORDER — KCL IN DEXTROSE-NACL 10-5-0.45 MEQ/L-%-% IV SOLN
INTRAVENOUS | Status: DC
  Administered 2012-08-28 – 2012-08-29 (×3): via INTRAVENOUS
  Filled 2012-08-28 (×5): qty 1000

## 2012-08-28 MED ORDER — MAGIC MOUTHWASH
15.0000 mL | Freq: Four times a day (QID) | ORAL | Status: DC | PRN
  Filled 2012-08-28: qty 15

## 2012-08-28 MED ORDER — HYDROMORPHONE HCL PF 1 MG/ML IJ SOLN
2.0000 mg | INTRAMUSCULAR | Status: DC | PRN
  Administered 2012-08-28: 2 mg via INTRAVENOUS
  Filled 2012-08-28: qty 2

## 2012-08-28 MED ORDER — POLYETHYLENE GLYCOL 3350 17 G PO PACK
17.0000 g | PACK | Freq: Three times a day (TID) | ORAL | Status: DC
  Administered 2012-08-28 (×2): 17 g via ORAL
  Filled 2012-08-28 (×5): qty 1

## 2012-08-28 MED ORDER — METHYLPHENIDATE HCL 5 MG PO TABS
10.0000 mg | ORAL_TABLET | Freq: Every day | ORAL | Status: DC
  Administered 2012-08-29 – 2012-08-30 (×2): 10 mg via ORAL
  Filled 2012-08-28: qty 2

## 2012-08-28 MED ORDER — LIP MEDEX EX OINT
1.0000 "application " | TOPICAL_OINTMENT | Freq: Two times a day (BID) | CUTANEOUS | Status: DC
  Administered 2012-08-28 – 2012-08-30 (×5): 1 via TOPICAL
  Filled 2012-08-28: qty 7

## 2012-08-28 MED ORDER — PROMETHAZINE HCL 25 MG/ML IJ SOLN: 12.5000 mg | Freq: Four times a day (QID) | INTRAMUSCULAR | Status: DC | PRN

## 2012-08-28 MED ORDER — ZOLPIDEM TARTRATE 5 MG PO TABS
5.0000 mg | ORAL_TABLET | Freq: Every evening | ORAL | Status: DC | PRN
  Administered 2012-08-28 – 2012-08-29 (×2): 5 mg via ORAL
  Filled 2012-08-28 (×2): qty 1

## 2012-08-28 MED ORDER — HYDROMORPHONE HCL PF 1 MG/ML IJ SOLN
1.0000 mg | INTRAMUSCULAR | Status: DC | PRN
Start: 2012-08-28 — End: 2012-08-30
  Administered 2012-08-28 (×4): 2 mg via INTRAVENOUS
  Filled 2012-08-28 (×4): qty 2

## 2012-08-28 MED ORDER — METHYLPHENIDATE HCL 5 MG PO TABS
20.0000 mg | ORAL_TABLET | Freq: Every day | ORAL | Status: DC
  Administered 2012-08-28 – 2012-08-29 (×2): 20 mg via ORAL
  Filled 2012-08-28: qty 2
  Filled 2012-08-28: qty 4
  Filled 2012-08-28: qty 2
  Filled 2012-08-28: qty 3

## 2012-08-28 MED ORDER — DOCUSATE SODIUM 100 MG PO CAPS
100.0000 mg | ORAL_CAPSULE | Freq: Two times a day (BID) | ORAL | Status: DC
  Administered 2012-08-28 (×2): 100 mg via ORAL
  Filled 2012-08-28 (×3): qty 1

## 2012-08-28 MED ORDER — BISACODYL 10 MG RE SUPP
10.0000 mg | Freq: Every day | RECTAL | Status: DC | PRN
  Administered 2012-08-28: 10 mg via RECTAL
  Filled 2012-08-28: qty 1

## 2012-08-28 NOTE — Progress Notes (Signed)
Received pt's admission history from [facility]. Information was placed in the front of pt's chart.

## 2012-08-28 NOTE — Consult Note (Signed)
Reason for Consult: acute diverticulitis with microperforation  Referring Physician: Dr. Renae Fickle PCP: Dr. Marga Melnick GI: Dr. Claudette Head  HPI: Dakota Vang is a 47 year old male with a history of hypertension, hyperlipidemia and diverticulosis to transverse, descending and sigmoid colon noted on colonoscopy in December of 2013.  He presented to [facility] ED with abdominal pain and nausea.  He reports being admitted to a hospital in [place] several weeks ago for diverticulitis with microperforation.  He finished his cipro/flagyl on Monday and developed the symptoms on Wednesday.  This episode is exactly the same as the prior.  Furthermore, he had a first episode but was in his sigmoid colon in 2005.  He denies vomiting or diarrhea.  Last bowel movement was 3 days ago.  He characterizes the pain as sharp and stabbing and without radiation.  Location is epigastric.  Aggravating factors; attributes to popcorn/seeds.  Alleviated with pain medication and Mg citrate.  He denies fever chills or sweats.  He has been on clear liquid diet which he has tolerated well.  He denies previous abdominal surgeries.  Denies history of IBD, Crohns or glucose intolerance.  He normally consumes a high fiber diet and uses Mg citrate when he starts to have the above symptoms. At present time he is symptom free.  Past Medical History  Diagnosis Date  . GERD (gastroesophageal reflux disease)   . Colon polyps 2008    Dr Russella Dar  . Diverticulosis 2002    Dr Corinda Gubler  . Hyperlipidemia   . Diverticulitis     PMH of  . ADD (attention deficit disorder with hyperactivity)     Dr. Minta Balsam  . Anxiety     Dr. Minta Balsam  . Hypertension     Past Surgical History  Procedure Laterality Date  . Tonsillectomy    . Lasik      bilaterally  . Colonoscopy w/ polypectomy  2002 & 2008    Dr. Russella Dar; polyps X 2  . Colonoscopy  2013    Dr Russella Dar    Family History  Problem Relation Age of Onset  . Colon cancer Father     in 57s   . Lung cancer Father     smoker; asbestos exposure  . Cancer Father     Mesothelioma  . Coronary artery disease Mother     CAGB X 2 & angio 3-4X  . Cancer Mother     Gallbladder  . Coronary artery disease Maternal Grandmother   . COPD Maternal Grandmother   . Heart attack Maternal Grandfather     late 60's  . Heart attack Paternal Grandfather 27  . Diabetes Neg Hx   . Stroke Neg Hx     Social History:  reports that he has been smoking Cigarettes.  He has been smoking about 0.00 packs per day. He has never used smokeless tobacco. He reports that he drinks about 3.6 ounces of alcohol per week. He reports that he does not use illicit drugs.  He is married and has 3 children.  He is a Production designer, theatre/television/film at a [employer].  Allergies:  Allergies  Allergen Reactions  . Penicillins Other (See Comments)    ? Reaction @ 47 yrs old    Medications:medication reviewed  Results for orders placed during the hospital encounter of 08/27/12 (from the past 48 hour(s))  CBC WITH DIFFERENTIAL     Status: Abnormal   Collection Time    08/27/12  1:46 PM      Result Value  Range   WBC 15.3 (*) 4.0 - 10.5 K/uL   RBC 5.10  4.22 - 5.81 MIL/uL   Hemoglobin 15.4  13.0 - 17.0 g/dL   HCT 16.1  09.6 - 04.5 %   MCV 85.1  78.0 - 100.0 fL   MCH 30.2  26.0 - 34.0 pg   MCHC 35.5  30.0 - 36.0 g/dL   RDW 40.9  81.1 - 91.4 %   Platelets 243  150 - 400 K/uL   Neutrophils Relative % 75  43 - 77 %   Neutro Abs 11.4 (*) 1.7 - 7.7 K/uL   Lymphocytes Relative 18  12 - 46 %   Lymphs Abs 2.7  0.7 - 4.0 K/uL   Monocytes Relative 7  3 - 12 %   Monocytes Absolute 1.0  0.1 - 1.0 K/uL   Eosinophils Relative 1  0 - 5 %   Eosinophils Absolute 0.2  0.0 - 0.7 K/uL   Basophils Relative 0  0 - 1 %   Basophils Absolute 0.1  0.0 - 0.1 K/uL  LIPASE, BLOOD     Status: None   Collection Time    08/27/12  1:46 PM      Result Value Range   Lipase 30  11 - 59 U/L  HEPATIC FUNCTION PANEL     Status: None   Collection Time     08/27/12  1:46 PM      Result Value Range   Total Protein 7.0  6.0 - 8.3 g/dL   Albumin 4.1  3.5 - 5.2 g/dL   AST 16  0 - 37 U/L   ALT 35  0 - 53 U/L   Alkaline Phosphatase 70  39 - 117 U/L   Total Bilirubin 0.4  0.3 - 1.2 mg/dL   Bilirubin, Direct 0.1  0.0 - 0.3 mg/dL   Indirect Bilirubin 0.3  0.3 - 0.9 mg/dL  URINALYSIS, ROUTINE W REFLEX MICROSCOPIC     Status: None   Collection Time    08/27/12  3:03 PM      Result Value Range   Color, Urine YELLOW  YELLOW   APPearance CLEAR  CLEAR   Specific Gravity, Urine 1.024  1.005 - 1.030   pH 5.5  5.0 - 8.0   Glucose, UA NEGATIVE  NEGATIVE mg/dL   Hgb urine dipstick NEGATIVE  NEGATIVE   Bilirubin Urine NEGATIVE  NEGATIVE   Ketones, ur NEGATIVE  NEGATIVE mg/dL   Protein, ur NEGATIVE  NEGATIVE mg/dL   Urobilinogen, UA 0.2  0.0 - 1.0 mg/dL   Nitrite NEGATIVE  NEGATIVE   Leukocytes, UA NEGATIVE  NEGATIVE   Comment: MICROSCOPIC NOT DONE ON URINES WITH NEGATIVE PROTEIN, BLOOD, LEUKOCYTES, NITRITE, OR GLUCOSE <1000 mg/dL.  CG4 I-STAT (LACTIC ACID)     Status: None   Collection Time    08/27/12  3:35 PM      Result Value Range   Lactic Acid, Venous 0.94  0.5 - 2.2 mmol/L  POCT I-STAT, CHEM 8     Status: None   Collection Time    08/27/12  3:35 PM      Result Value Range   Sodium 141  135 - 145 mEq/L   Potassium 4.3  3.5 - 5.1 mEq/L   Chloride 107  96 - 112 mEq/L   BUN 12  6 - 23 mg/dL   Creatinine, Ser 7.82  0.50 - 1.35 mg/dL   Glucose, Bld 91  70 - 99 mg/dL   Calcium, Ion  1.13  1.12 - 1.23 mmol/L   TCO2 22  0 - 100 mmol/L   Hemoglobin 14.3  13.0 - 17.0 g/dL   HCT 16.1  09.6 - 04.5 %  CBC     Status: Abnormal   Collection Time    08/28/12 11:35 AM      Result Value Range   WBC 12.0 (*) 4.0 - 10.5 K/uL   RBC 4.60  4.22 - 5.81 MIL/uL   Hemoglobin 13.4  13.0 - 17.0 g/dL   HCT 40.9  81.1 - 91.4 %   MCV 86.7  78.0 - 100.0 fL   MCH 29.1  26.0 - 34.0 pg   MCHC 33.6  30.0 - 36.0 g/dL   RDW 78.2  95.6 - 21.3 %   Platelets 216  150 -  400 K/uL    Ct Abdomen Pelvis Wo Contrast  08/27/2012   *RADIOLOGY REPORT*  Clinical Data: Epigastric pain, nausea.  Recent diverticulitis.  CT ABDOMEN AND PELVIS WITHOUT CONTRAST  Technique:  Multidetector CT imaging of the abdomen and pelvis was performed following the standard protocol without intravenous contrast.  Comparison: Plain films earlier today.  CT 10/01/2003.  Findings: Lung bases are clear.  No effusions.  Heart is normal size.  Liver, gallbladder, spleen, pancreas, adrenals and kidneys have an unremarkable unenhanced appearance.  Marked inflammatory process noted within the jejunal small bowel mesentery with associated wall thickening in multiple loops of jejunum.  There is a large jejunal diverticulum noted on image 40 of series 2.  On the same image, there appears to be a small locule of extraluminal gas within the inflamed mesentery.  Findings most likely represent a jejunal diverticulitis.  There are a few scattered small reactive mesenteric lymph nodes in this area.  Descending colonic and sigmoid diverticulosis.  Wall thickening in the sigmoid colon compatible with diverticulosis.  No evidence of colonic diverticulitis.  Small amount of free fluid in the pelvis.  Appendix is visualized and is normal.  4.3 cm duodenal diverticulum adjacent to the second and third portions of the duodenum.  Stomach is grossly unremarkable.  IMPRESSION: Marked inflammatory process involving several jejunal small bowel loops and the jejunal mesentery.  There appears to be a large jejunal diverticulum in this area and findings most likely reflect a jejunal diverticulitis.  I believe I see at least one small locule of extraluminal gas suggesting micro perforation.  Adjacent reactive mesenteric lymph nodes.  These results were called to Dr. Jeraldine Loots at the time of interpretation.   Original Report Authenticated By: Charlett Nose, M.D.   Abd 1 View (kub)  08/28/2012   *RADIOLOGY REPORT*  Clinical Data: Abdominal  pain, recent diverticulitis and inflammatory process of bowel.  ABDOMEN - 1 VIEW  Comparison: CT of the abdomen and pelvis on 08/27/2012.  Findings: Contrast administered for CT yesterday shows transit into the colon.  There is no evidence of bowel obstruction or significant ileus.  No abnormal calcifications are identified.  IMPRESSION: No evidence of bowel obstruction or ileus.   Original Report Authenticated By: Irish Lack, M.D.   Dg Abd Acute W/chest  08/27/2012   *RADIOLOGY REPORT*  Clinical Data: Rule out free air  ACUTE ABDOMEN SERIES (ABDOMEN 2 VIEW & CHEST 1 VIEW)  Comparison: 04/24/2010  Findings: Normal heart size.  There is no pleural effusion or edema.  No airspace consolidation identified.  Moderate stool burden identified within the colon.  No dilated loops of small bowel or air-fluid levels identified.  No free intraperitoneal  air is identified.  No abnormal abdominal or pelvic calcifications.  IMPRESSION:  1.  No acute abnormality noted. 2.  Moderate stool burden within the colon.   Original Report Authenticated By: Signa Kell, M.D.    Review of Systems  Constitutional: Negative for fever, chills, weight loss and malaise/fatigue.  Eyes: Negative for blurred vision, double vision and photophobia.  Respiratory: Negative for cough, sputum production, shortness of breath and wheezing.   Cardiovascular: Negative for chest pain, palpitations, orthopnea, claudication, leg swelling and PND.  Gastrointestinal: Negative for heartburn, nausea, vomiting, abdominal pain, diarrhea, constipation, blood in stool and melena.  Genitourinary: Negative for dysuria, urgency, frequency, hematuria and flank pain.  Musculoskeletal: Negative for myalgias and joint pain.  Neurological: Negative for dizziness, tingling, speech change, focal weakness, seizures, loss of consciousness and headaches.  Psychiatric/Behavioral: Negative for hallucinations and substance abuse. The patient is not nervous/anxious.     Blood pressure 112/87, pulse 98, temperature 98.8 F (37.1 C), temperature source Oral, resp. rate 18, height 6\' 1"  (1.854 m), weight 222 lb 14.2 oz (101.1 kg), SpO2 96.00%. Physical Exam  Constitutional: He is oriented to person, place, and time. He appears well-developed and well-nourished. No distress.  HENT:  Head: Normocephalic and atraumatic.  Eyes: Conjunctivae and EOM are normal. Pupils are equal, round, and reactive to light. Right eye exhibits discharge. Left eye exhibits no discharge. No scleral icterus.  Neck: No thyromegaly present.  Cardiovascular: Normal rate, regular rhythm and normal heart sounds.  Exam reveals no gallop and no friction rub.   No murmur heard. Respiratory: Effort normal and breath sounds normal. No respiratory distress. He has no wheezes. He has no rales. He exhibits no tenderness.  GI: Bowel sounds are normal. He exhibits no distension and no mass. There is no tenderness. There is no rebound and no guarding.  Pt states "i can feel it but doesn't hurt"  Musculoskeletal: He exhibits no edema and no tenderness.  Lymphadenopathy:    He has no cervical adenopathy.  Neurological: He is alert and oriented to person, place, and time. No cranial nerve deficit.  Skin: Skin is warm and dry. No rash noted. He is not diaphoretic. No erythema. No pallor.  Psychiatric: He has a normal mood and affect. His behavior is normal. Judgment and thought content normal.    Assessment/Plan: Acute diverticulitis: with microperforation(2nd episode to this region) NPO for the time being given that his symptoms started on Wednesday and has not rested his bowel as well as the location of the diverticulitis.  Continue with IV atbx.  High white count is elevated but trending down, repeat tomorrow morning.  If he is to remain NPO will start IV fluids.  There are no indications for surgery at this time.  We will continue to follow the patient.   Ashok Norris 08/28/2012, 12:10 PM

## 2012-08-28 NOTE — Consult Note (Addendum)
Pt with recurrent episode of jejunal diverticulitis.  Stabilizing on bowel rest & IV Invanz (recurred after Cipro/Flagyl)  I suspect pt need elective resection of jejunum containing the diverticula as this seems to be the 2nd attack in a short time.  D/w surgical partner, Dr. Andrey Campanile, whom agrees  Need the outside records from [facility] to confirm this.     CT scan from [facility] 08/08/12 revealed: "slightly dilated few proximal jejunal bowel segments with adjacent inflammation in the left abdomen. There is suggestion of microperforation @ this level.There is questionable tiny calcific density within the small bowel segment proximal to inflammation.Constellation of findings suspicious for proximal jejunal inflammation secondary to small bowel diverticulitis vs foreign body causing perforation w/o abscess.".   He was Rxed Cipro 500 mg bid & Flagyl 500 mg tid 6/3-6/13.        The anatomy & physiology of the digestive tract was discussed.  The pathophysiology was discussed.  Natural history risks without surgery was discussed.   I feel the risks of no intervention will lead to serious problems that outweigh the operative risks; therefore, I recommended a partial jejunal resection of the diverticula to remove the pathology.  Laparoscopic & open techniques were discussed.   Risks such as bleeding, infection, abscess, leak, reoperation, possible ostomy, hernia, heart attack, death, and other risks were discussed.  I noted a good likelihood this will help address the problem.   Goals of post-operative recovery were discussed as well.  We will work to minimize complications.  An educational handout on the pathology was given as well.  Questions were answered.  The patient expresses understanding & wishes to consider surgery.  We will see if theis can be an outpatient discussion.    Pt comfortable & not toxic at this time.  The patient is stable.  There is no evidence of peritonitis, acute abdomen,  nor shock.  There is no strong evidence of failure of improvement nor decline with current non-operative management.  There is no need for surgery at the present moment.  We will continue to follow.

## 2012-08-28 NOTE — Progress Notes (Addendum)
[facility] PROGRESS NOTE  Dakota Vang WUJ:[id number] DOB: 1965-07-24 DOA: 08/27/2012 PCP: Marga Melnick, MD  Assessment/Plan  Jejunal diverticulitis with microperforation. -  Continue antibiotics -  Gen. surgery consultation, appreciate assistance -  N.p.o. with IV fluid -  Will touch base with GI as they had requested the patient be admitted -  Will increase anti-emetics and pain medication per patient request -   discontinue C. difficile and enteric precautions as no bowel movements in 3 days   Hypertension/hyperlipidemia, stable.  Continue home medications GERD, stable. Continue PPI Leukocytosis, likely due to diverticulitis with microperforation, resolving with IV antibiotics. -  Followup repeat CBC in the morning Constipation, not resolved with MiraLax yet.  No evidence of obstruction or ileus on x-ray this morning -   Enema and to continue frequent MiraLax until bowel movement Anxiety and ADD, for outpatient management  Diet:  N.p.o. Access:  PIV IVF:  D5 half-normal saline with 10 mEq of KCl at 75 mL per hour Proph:  SCDs  Code Status: Full code Family Communication: Spoke with the patient and his wife Disposition Plan: Pending tolerating a diet and clinical improvement   Consultants:  General surgery  Procedures:  CT scan abdomen and pelvis  Antibiotics:  Ivanz June 19   HPI/Subjective:  The patient states that he continues to have abdominal pain and that the current pain medication is not working very well. He also feels like he is still constipated.  He is not having good bowel movements despite his suppository. He denies fevers, chills, chest pain, shortness of breath. He needs more nausea medication.  Objective: Filed Vitals:   08/27/12 2030 08/27/12 2102 08/28/12 0524 08/28/12 0740  BP: 135/89 139/87 124/74 112/87  Pulse: 108 108 98 98  Temp:  97.5 F (36.4 C) 99.4 F (37.4 C) 98.8 F (37.1 C)  TempSrc:  Oral Oral Oral  Resp:  18 18  18   Height:  6\' 1"  (1.854 m)    Weight:  101.1 kg (222 lb 14.2 oz)    SpO2: 95% 96% 95% 96%    Intake/Output Summary (Last 24 hours) at 08/28/12 1235 Last data filed at 08/28/12 0900  Gross per 24 hour  Intake    320 ml  Output      0 ml  Net    320 ml   Filed Weights   08/27/12 2102  Weight: 101.1 kg (222 lb 14.2 oz)    Exam:   General:  Caucasian male,  No acute distress  HEENT:  NCAT, MMM  Cardiovascular:  RRR nl S1, S2 no mrg, 2+ pulses, warm extremities  Respiratory:  CTAB, no increased WOB  Abdomen:  NABS, soft, mildly distended and tender to palpation particularly along the left periumbilical area and in the left upper quadrant. No rebound or guarding  MSK:  Normal tone and bulk, no LEE  Neuro:  Grossly intact  Data Reviewed: Basic Metabolic Panel:  Recent Labs Lab 08/27/12 1535 08/28/12 1135  NA 141 135  K 4.3 4.1  CL 107 100  CO2  --  26  GLUCOSE 91 102*  BUN 12 10  CREATININE 0.90 0.94  CALCIUM  --  9.0   Liver Function Tests:  Recent Labs Lab 08/24/12 1024 08/27/12 1346  AST 30 16  ALT 49 35  ALKPHOS  --  70  BILITOT  --  0.4  PROT  --  7.0  ALBUMIN  --  4.1    Recent Labs Lab 08/27/12 1346  LIPASE 30   No results found for this basename: AMMONIA,  in the last 168 hours CBC:  Recent Labs Lab 08/27/12 1346 08/27/12 1535 08/28/12 1135  WBC 15.3*  --  12.0*  NEUTROABS 11.4*  --   --   HGB 15.4 14.3 13.4  HCT 43.4 42.0 39.9  MCV 85.1  --  86.7  PLT 243  --  216   Cardiac Enzymes: No results found for this basename: CKTOTAL, CKMB, CKMBINDEX, TROPONINI,  in the last 168 hours BNP (last 3 results) No results found for this basename: PROBNP,  in the last 8760 hours CBG: No results found for this basename: GLUCAP,  in the last 168 hours  No results found for this or any previous visit (from the past 240 hour(s)).   Studies: Ct Abdomen Pelvis Wo Contrast  08/27/2012   *RADIOLOGY REPORT*  Clinical Data: Epigastric pain,  nausea.  Recent diverticulitis.  CT ABDOMEN AND PELVIS WITHOUT CONTRAST  Technique:  Multidetector CT imaging of the abdomen and pelvis was performed following the standard protocol without intravenous contrast.  Comparison: Plain films earlier today.  CT 10/01/2003.  Findings: Lung bases are clear.  No effusions.  Heart is normal size.  Liver, gallbladder, spleen, pancreas, adrenals and kidneys have an unremarkable unenhanced appearance.  Marked inflammatory process noted within the jejunal small bowel mesentery with associated wall thickening in multiple loops of jejunum.  There is a large jejunal diverticulum noted on image 40 of series 2.  On the same image, there appears to be a small locule of extraluminal gas within the inflamed mesentery.  Findings most likely represent a jejunal diverticulitis.  There are a few scattered small reactive mesenteric lymph nodes in this area.  Descending colonic and sigmoid diverticulosis.  Wall thickening in the sigmoid colon compatible with diverticulosis.  No evidence of colonic diverticulitis.  Small amount of free fluid in the pelvis.  Appendix is visualized and is normal.  4.3 cm duodenal diverticulum adjacent to the second and third portions of the duodenum.  Stomach is grossly unremarkable.  IMPRESSION: Marked inflammatory process involving several jejunal small bowel loops and the jejunal mesentery.  There appears to be a large jejunal diverticulum in this area and findings most likely reflect a jejunal diverticulitis.  I believe I see at least one small locule of extraluminal gas suggesting micro perforation.  Adjacent reactive mesenteric lymph nodes.  These results were called to Dr. Jeraldine Loots at the time of interpretation.   Original Report Authenticated By: Charlett Nose, M.D.   Abd 1 View (kub)  08/28/2012   *RADIOLOGY REPORT*  Clinical Data: Abdominal pain, recent diverticulitis and inflammatory process of bowel.  ABDOMEN - 1 VIEW  Comparison: CT of the abdomen  and pelvis on 08/27/2012.  Findings: Contrast administered for CT yesterday shows transit into the colon.  There is no evidence of bowel obstruction or significant ileus.  No abnormal calcifications are identified.  IMPRESSION: No evidence of bowel obstruction or ileus.   Original Report Authenticated By: Irish Lack, M.D.   Dg Abd Acute W/chest  08/27/2012   *RADIOLOGY REPORT*  Clinical Data: Rule out free air  ACUTE ABDOMEN SERIES (ABDOMEN 2 VIEW & CHEST 1 VIEW)  Comparison: 04/24/2010  Findings: Normal heart size.  There is no pleural effusion or edema.  No airspace consolidation identified.  Moderate stool burden identified within the colon.  No dilated loops of small bowel or air-fluid levels identified.  No free intraperitoneal air is identified.  No abnormal  abdominal or pelvic calcifications.  IMPRESSION:  1.  No acute abnormality noted. 2.  Moderate stool burden within the colon.   Original Report Authenticated By: Signa Kell, M.D.    Scheduled Meds: . amLODipine  5 mg Oral Daily  . atorvastatin  40 mg Oral q1800  . buPROPion  300 mg Oral Daily  . carvedilol  6.25 mg Oral BID WC  . docusate sodium  100 mg Oral BID  . ertapenem  1 g Intravenous Q24H  . lamoTRIgine  150 mg Oral BID  . methylphenidate  5 mg Oral BID WC  . pantoprazole  40 mg Oral Daily  . polyethylene glycol  17 g Oral TID PC & HS  . ramipril  10 mg Oral Daily  . saccharomyces boulardii  250 mg Oral Daily  . sodium phosphate  1 enema Rectal Once   Continuous Infusions:   Principal Problem:   Abdominal  pain, other specified site Active Problems:   HYPERLIPIDEMIA   HYPERTENSION   G E R D   DIVERTICULITIS, HX OF   Leukocytosis   Constipation   Acute diverticulitis    Time spent: 30 min    Bianney Rockwood, [facility]  [facility] Pager [phone]. If 7PM-7AM, please contact night-coverage at [email or web address], password [email or web address] 08/28/2012, 12:35 PM  LOS: 1 day

## 2012-08-28 NOTE — Progress Notes (Signed)
UR COMPLETED  

## 2012-08-29 DIAGNOSIS — K5710 Diverticulosis of small intestine without perforation or abscess without bleeding: Secondary | ICD-10-CM

## 2012-08-29 LAB — BASIC METABOLIC PANEL
CO2: 29 mEq/L (ref 19–32)
Calcium: 9.1 mg/dL (ref 8.4–10.5)
Glucose, Bld: 109 mg/dL — ABNORMAL HIGH (ref 70–99)
Potassium: 4.1 mEq/L (ref 3.5–5.1)
Sodium: 136 mEq/L (ref 135–145)

## 2012-08-29 LAB — CBC
Hemoglobin: 13.2 g/dL (ref 13.0–17.0)
MCH: 29.5 pg (ref 26.0–34.0)
RBC: 4.48 MIL/uL (ref 4.22–5.81)

## 2012-08-29 MED ORDER — POLYETHYLENE GLYCOL 3350 17 G PO PACK
17.0000 g | PACK | Freq: Every day | ORAL | Status: DC | PRN
  Filled 2012-08-29: qty 1

## 2012-08-29 NOTE — Progress Notes (Signed)
Patient ID: GOLDMAN BIRCHALL, male   DOB: Aug 01, 1965, 47 y.o.   MRN: [id number]    Subjective:  No C/O today , feels much better.  Denis pain or nausea  Objective: Vital signs in last 24 hours: Temp:  [98.4 F (36.9 C)-99.3 F (37.4 C)] 98.8 F (37.1 C) (06/21 0637) Pulse Rate:  [84-93] 84 (06/21 0637) Resp:  [18] 18 (06/21 0637) BP: (115-136)/(73-81) 115/73 mmHg (06/21 0637) SpO2:  [95 %-97 %] 96 % (06/21 0637) Weight:  [221 lb 12.5 oz (100.6 kg)] 221 lb 12.5 oz (100.6 kg) (06/20 2027) Last BM Date: 08/28/12  Intake/Output from previous day: 06/20 0701 - 06/21 0700 In: 1990 [P.O.:1040; I.V.:900; IV Piggyback:50] Out: 1325 [Urine:1325] Intake/Output this shift:    General appearance: alert, cooperative and no distress GI: normal findings: soft, non-tender  Lab Results:   Recent Labs  08/28/12 1135 08/29/12 0535  WBC 12.0* 9.8  HGB 13.4 13.2  HCT 39.9 39.1  PLT 216 197   BMET  Recent Labs  08/28/12 1135 08/29/12 0535  NA 135 136  K 4.1 4.1  CL 100 97  CO2 26 29  GLUCOSE 102* 109*  BUN 10 9  CREATININE 0.94 1.02  CALCIUM 9.0 9.1     Studies/Results: Ct Abdomen Pelvis Wo Contrast  08/27/2012   *RADIOLOGY REPORT*  Clinical Data: Epigastric pain, nausea.  Recent diverticulitis.  CT ABDOMEN AND PELVIS WITHOUT CONTRAST  Technique:  Multidetector CT imaging of the abdomen and pelvis was performed following the standard protocol without intravenous contrast.  Comparison: Plain films earlier today.  CT 10/01/2003.  Findings: Lung bases are clear.  No effusions.  Heart is normal size.  Liver, gallbladder, spleen, pancreas, adrenals and kidneys have an unremarkable unenhanced appearance.  Marked inflammatory process noted within the jejunal small bowel mesentery with associated wall thickening in multiple loops of jejunum.  There is a large jejunal diverticulum noted on image 40 of series 2.  On the same image, there appears to be a small locule of extraluminal gas within  the inflamed mesentery.  Findings most likely represent a jejunal diverticulitis.  There are a few scattered small reactive mesenteric lymph nodes in this area.  Descending colonic and sigmoid diverticulosis.  Wall thickening in the sigmoid colon compatible with diverticulosis.  No evidence of colonic diverticulitis.  Small amount of free fluid in the pelvis.  Appendix is visualized and is normal.  4.3 cm duodenal diverticulum adjacent to the second and third portions of the duodenum.  Stomach is grossly unremarkable.  IMPRESSION: Marked inflammatory process involving several jejunal small bowel loops and the jejunal mesentery.  There appears to be a large jejunal diverticulum in this area and findings most likely reflect a jejunal diverticulitis.  I believe I see at least one small locule of extraluminal gas suggesting micro perforation.  Adjacent reactive mesenteric lymph nodes.  These results were called to Dr. Jeraldine Loots at the time of interpretation.   Original Report Authenticated By: Charlett Nose, M.D.   Abd 1 View (kub)  08/28/2012   *RADIOLOGY REPORT*  Clinical Data: Abdominal pain, recent diverticulitis and inflammatory process of bowel.  ABDOMEN - 1 VIEW  Comparison: CT of the abdomen and pelvis on 08/27/2012.  Findings: Contrast administered for CT yesterday shows transit into the colon.  There is no evidence of bowel obstruction or significant ileus.  No abnormal calcifications are identified.  IMPRESSION: No evidence of bowel obstruction or ileus.   Original Report Authenticated By: Irish Lack, M.D.  Dg Abd Acute W/chest  08/27/2012   *RADIOLOGY REPORT*  Clinical Data: Rule out free air  ACUTE ABDOMEN SERIES (ABDOMEN 2 VIEW & CHEST 1 VIEW)  Comparison: 04/24/2010  Findings: Normal heart size.  There is no pleural effusion or edema.  No airspace consolidation identified.  Moderate stool burden identified within the colon.  No dilated loops of small bowel or air-fluid levels identified.  No free  intraperitoneal air is identified.  No abnormal abdominal or pelvic calcifications.  IMPRESSION:  1.  No acute abnormality noted. 2.  Moderate stool burden within the colon.   Original Report Authenticated By: Signa Kell, M.D.    Anti-infectives: Anti-infectives   Start     Dose/Rate Route Frequency Ordered Stop   08/27/12 2300  ertapenem (INVANZ) 1 g in sodium chloride 0.9 % 50 mL IVPB     1 g 100 mL/hr over 30 Minutes Intravenous Every 24 hours 08/27/12 2105     08/27/12 1730  ertapenem (INVANZ) 1 g in sodium chloride 0.9 % 50 mL IVPB     1 g 100 mL/hr over 30 Minutes Intravenous  Once 08/27/12 1724 08/27/12 2210   08/27/12 1715  Ampicillin-Sulbactam (UNASYN) 3 g in sodium chloride 0.9 % 100 mL IVPB  Status:  Discontinued     3 g 100 mL/hr over 60 Minutes Intravenous  Once 08/27/12 1709 08/27/12 1710   08/27/12 1715  ertapenem (INVANZ) 1 g in sodium chloride 0.9 % 50 mL IVPB  Status:  Discontinued     1 g 100 mL/hr over 30 Minutes Intravenous  Once 08/27/12 1711 08/27/12 1723      Assessment/Plan: Jejunal diverticulitis, much improved on abx Start CL diet, possible discharge tomorrow    LOS: 2 days    Ellizabeth Dacruz T 08/29/2012

## 2012-08-29 NOTE — Progress Notes (Addendum)
[facility] PROGRESS NOTE  Dakota Vang WGN:[id number] DOB: [birth year] DOA: 08/27/2012 PCP: Marga Melnick, MD  Assessment/Plan  Jejunal diverticulitis with microperforation. -  Continue antibiotics -  Gen. surgery consultation, appreciate assistance -  Advance to [facility] - continue anti-emetics and pain medication  Hypertension/hyperlipidemia, stable.  Continue home medications GERD, stable. Continue PPI Leukocytosis, likely due to diverticulitis with microperforation, resolved Constipation, not resolved with MiraLax yet.  No evidence of obstruction or ileus on x-ray this morning -   restart MiraLax until bowel movement Anxiety and ADD, for outpatient management.  Continue home medication  Diet:  N.p.o. Access:  PIV IVF:  D5 half-normal saline with 10 mEq of KCl at 75 mL per hour Proph:  SCDs  Code Status: Full code Family Communication: Spoke with the patient and his wife Disposition Plan: Pending tolerating a diet and clinical improvement, possibly home tomorrow   Consultants:  General surgery  Procedures:  CT scan abdomen and pelvis  Antibiotics:  Ivanz June 19   HPI/Subjective:  The patient states that his abdominal pain is much better however he has not had a good bowel movement yet. His pain and nausea are better controlled.  Objective: Filed Vitals:   08/28/12 1330 08/28/12 1744 08/28/12 2027 08/29/12 0637  BP: 136/81 132/80 119/77 115/73  Pulse: 88 93 89 84  Temp: 98.4 F (36.9 C) 98.4 F (36.9 C) 99.3 F (37.4 C) 98.8 F (37.1 C)  TempSrc: Oral Oral Oral Oral  Resp: 18 18 18 18   Height:      Weight:   100.6 kg (221 lb 12.5 oz)   SpO2: 97% 97% 95% 96%    Intake/Output Summary (Last 24 hours) at 08/29/12 1836 Last data filed at 08/29/12 1600  Gross per 24 hour  Intake   1070 ml  Output   2525 ml  Net  -1455 ml   Filed Weights   08/27/12 2102 08/28/12 2027  Weight: 101.1 kg (222 lb 14.2 oz) 100.6 kg (221 lb 12.5 oz)     Exam:   General:  Caucasian male,  No acute distress  HEENT:  NCAT, MMM  Cardiovascular:  RRR nl S1, S2 no mrg, 2+ pulses, warm extremities  Respiratory:  CTAB, no increased WOB  Abdomen:  NABS, soft, mildly distended and mildly tender to palpation particularly along the left periumbilical area and in the left upper quadrant. No rebound or guarding  MSK:  Normal tone and bulk, no LEE  Neuro:  Grossly intact  Data Reviewed: Basic Metabolic Panel:  Recent Labs Lab 08/27/12 1535 08/28/12 1135 08/29/12 0535  NA 141 135 136  K 4.3 4.1 4.1  CL 107 100 97  CO2  --  26 29  GLUCOSE 91 102* 109*  BUN 12 10 9   CREATININE 0.90 0.94 1.02  CALCIUM  --  9.0 9.1   Liver Function Tests:  Recent Labs Lab 08/24/12 1024 08/27/12 1346  AST 30 16  ALT 49 35  ALKPHOS  --  70  BILITOT  --  0.4  PROT  --  7.0  ALBUMIN  --  4.1    Recent Labs Lab 08/27/12 1346  LIPASE 30   No results found for this basename: AMMONIA,  in the last 168 hours CBC:  Recent Labs Lab 08/27/12 1346 08/27/12 1535 08/28/12 1135 08/29/12 0535  WBC 15.3*  --  12.0* 9.8  NEUTROABS 11.4*  --   --   --   HGB 15.4 14.3 13.4 13.2  HCT 43.4 42.0  39.9 39.1  MCV 85.1  --  86.7 87.3  PLT 243  --  216 197   Cardiac Enzymes: No results found for this basename: CKTOTAL, CKMB, CKMBINDEX, TROPONINI,  in the last 168 hours BNP (last 3 results) No results found for this basename: PROBNP,  in the last 8760 hours CBG: No results found for this basename: GLUCAP,  in the last 168 hours  No results found for this or any previous visit (from the past 240 hour(s)).   Studies: Abd 1 View (kub)  08/28/2012   *RADIOLOGY REPORT*  Clinical Data: Abdominal pain, recent diverticulitis and inflammatory process of bowel.  ABDOMEN - 1 VIEW  Comparison: CT of the abdomen and pelvis on 08/27/2012.  Findings: Contrast administered for CT yesterday shows transit into the colon.  There is no evidence of bowel obstruction  or significant ileus.  No abnormal calcifications are identified.  IMPRESSION: No evidence of bowel obstruction or ileus.   Original Report Authenticated By: Irish Lack, M.D.    Scheduled Meds: . amLODipine  5 mg Oral Daily  . buPROPion  300 mg Oral Daily  . carvedilol  6.25 mg Oral BID WC  . ertapenem  1 g Intravenous Q24H  . lamoTRIgine  150 mg Oral BID  . lip balm  1 application Topical BID  . methylphenidate  10 mg Oral QAC breakfast  . methylphenidate  20 mg Oral Q1200  . pantoprazole  40 mg Oral Daily  . ramipril  10 mg Oral Daily  . saccharomyces boulardii  250 mg Oral Daily   Continuous Infusions: . dextrose 5 % and 0.45 % NaCl with KCl 10 mEq/L 75 mL/hr at 08/29/12 0135    Principal Problem:   Diverticulitis of jejunum with microperforation Active Problems:   HYPERLIPIDEMIA   HYPERTENSION   G E R D   COLONIC POLYPS, HX OF   DIVERTICULITIS, HX OF   Leukocytosis   Constipation   Duodenal diverticulum    Time spent: 30 min    Isa Kohlenberg  [facility] Pager [phone]. If 7PM-7AM, please contact night-coverage at [email or web address], password [facility] 08/29/2012, 6:36 PM  LOS: 2 days

## 2012-08-30 MED ORDER — METRONIDAZOLE 500 MG PO TABS: 500.0000 mg | ORAL_TABLET | Freq: Three times a day (TID) | ORAL | Status: DC

## 2012-08-30 MED ORDER — CIPROFLOXACIN HCL 500 MG PO TABS: 500.0000 mg | ORAL_TABLET | Freq: Two times a day (BID) | ORAL | Status: DC

## 2012-08-30 MED ORDER — PROMETHAZINE HCL 12.5 MG PO TABS
12.5000 mg | ORAL_TABLET | Freq: Four times a day (QID) | ORAL | Status: DC | PRN
Start: 2012-08-30 — End: 2012-10-19

## 2012-08-30 NOTE — Progress Notes (Signed)
Patient ID: Dakota Vang, male   DOB: 1965/04/01, 47 y.o.   MRN: [id number]    Subjective: Denies any pain. Minimal left-sided abdominal "soreness". Tolerating full liquids without difficulty. Has had a couple of bowel movements.  Objective: Vital signs in last 24 hours: Temp:  [97.5 F (36.4 C)-98.4 F (36.9 C)] 97.8 F (36.6 C) (06/22 0550) Pulse Rate:  [71-80] 76 (06/22 0839) Resp:  [18-21] 20 (06/22 0839) BP: (108-127)/(74-92) 127/92 mmHg (06/22 0839) SpO2:  [97 %-100 %] 98 % (06/22 0839) Weight:  [218 lb 9.6 oz (99.156 kg)] 218 lb 9.6 oz (99.156 kg) (06/21 2030) Last BM Date: 08/29/12  Intake/Output from previous day: 06/21 0701 - 06/22 0700 In: 1195 [P.O.:360; I.V.:835] Out: 1680 [Urine:1680] Intake/Output this shift:    General appearance: alert, cooperative and no distress GI: normal findings: soft, non-tender  Lab Results:   Recent Labs  08/28/12 1135 08/29/12 0535  WBC 12.0* 9.8  HGB 13.4 13.2  HCT 39.9 39.1  PLT 216 197   BMET  Recent Labs  08/28/12 1135 08/29/12 0535  NA 135 136  K 4.1 4.1  CL 100 97  CO2 26 29  GLUCOSE 102* 109*  BUN 10 9  CREATININE 0.94 1.02  CALCIUM 9.0 9.1     Studies/Results: No results found.  Anti-infectives: Anti-infectives   Start     Dose/Rate Route Frequency Ordered Stop   08/30/12 0000  ciprofloxacin (CIPRO) 500 MG tablet     500 mg Oral 2 times daily 08/30/12 1016     08/30/12 0000  metroNIDAZOLE (FLAGYL) 500 MG tablet     500 mg Oral 3 times daily 08/30/12 1016     08/27/12 2300  ertapenem (INVANZ) 1 g in sodium chloride 0.9 % 50 mL IVPB     1 g 100 mL/hr over 30 Minutes Intravenous Every 24 hours 08/27/12 2105     08/27/12 1730  ertapenem (INVANZ) 1 g in sodium chloride 0.9 % 50 mL IVPB     1 g 100 mL/hr over 30 Minutes Intravenous  Once 08/27/12 1724 08/27/12 2210   08/27/12 1715  Ampicillin-Sulbactam (UNASYN) 3 g in sodium chloride 0.9 % 100 mL IVPB  Status:  Discontinued     3 g 100 mL/hr over  60 Minutes Intravenous  Once 08/27/12 1709 08/27/12 1710   08/27/12 1715  ertapenem (INVANZ) 1 g in sodium chloride 0.9 % 50 mL IVPB  Status:  Discontinued     1 g 100 mL/hr over 30 Minutes Intravenous  Once 08/27/12 1711 08/27/12 1723      Assessment/Plan: Recurrent small bowel diverticulitis. This has essentially resolved clinically. I think he is fine for discharge. We can follow in the office in one week and I have given him contact information. I recommended a soft diet for the next week. I would complete a course of oral antibiotics for one week, Cipro and Flagyl would be appropriate.    LOS: 3 days    Keiera Strathman T 08/30/2012

## 2012-08-30 NOTE — Discharge Summary (Signed)
Physician Discharge Summary  Dakota Vang JXB:[id number] DOB: [birth year] DOA: 08/27/2012  PCP: Marga Melnick, MD  Admit date: 08/27/2012 Discharge date: 08/30/2012  Recommendations for Outpatient Follow-up:  1. Followup with gastroenterology and general surgery as an outpatient.  Discharge Diagnoses:  Principal Problem:   Diverticulitis of jejunum with microperforation Active Problems:   HYPERLIPIDEMIA   HYPERTENSION   G E R D   COLONIC POLYPS, HX OF   DIVERTICULITIS, HX OF   Leukocytosis   Constipation   Duodenal diverticulum   Discharge Condition: Stable, improved  Diet recommendation: Healthy heart  Wt Readings from Last 3 Encounters:  08/29/12 99.156 kg (218 lb 9.6 oz)  08/24/12 102.513 kg (226 lb)  07/08/12 107.593 kg (237 lb 3.2 oz)    History of present illness:  Dakota Vang is a 47 y.o. male with pmh significant for GERD, diverticulosis (with recent admission for diverticulitis at the beginning of the month in [place]), HTN, HLD, ADD and anxiety; came to ED complaining of abdominal pain and nausea for the las 2 days. Patient reports pain similar to previous admission for diverticulitis. Endorses subjective fever, but no chills, he denies CP, vomiting, cough, hematochezia, melena or any other complaints.  In ED CT abdomen demonstrated inflammation involving jejunal diverticulitis and microperforation, WBC's > 15,000.  [facility] called to admit patient for further evaluation and treatment.  Hospital Course:   Jejunal diverticulitis with microperforation. General surgery was consult addendum the patient was made n.p.o. He was started on ertapenem. He was given anti-emetics and pain medication and his abdominal pain slowly improved he has been able to tolerate a full liquid diets and finally a healthy heart diet without worsening of his pain or nausea. He was given information about diverticulitis and a low fiber diet.  He should continue ciprofloxacin and Flagyl to  complete a 7 day course. He should followup with his gastroenterologist and the general surgeons.  Hypertension/hyperlipidemia, stable. Continue home medications  GERD, stable. Continue PPI  Leukocytosis, likely due to diverticulitis with microperforation, resolved. Constipation, no evidence of obstruction or ileus on x-ray.  Continue laxatives to have approximately 2-3 soft bowel movements per day Anxiety and ADD, stable. Continue home medication   Consultants:  General surgery Procedures:  CT scan abdomen and pelvis Antibiotics:  Ivanz June 19    Discharge Exam: Filed Vitals:   08/30/12 0839  BP: 127/92  Pulse: 76  Temp:   Resp: 20   Filed Vitals:   08/29/12 1800 08/29/12 2030 08/30/12 0550 08/30/12 0839  BP: 108/74 117/77 114/74 127/92  Pulse: 80 80 71 76  Temp: 98.4 F (36.9 C) 97.5 F (36.4 C) 97.8 F (36.6 C)   TempSrc: Oral Oral Oral   Resp: 18 19 21 20   Height:  6\' 1"  (1.854 m)    Weight:  99.156 kg (218 lb 9.6 oz)    SpO2: 97% 100% 98% 98%   Patient states that his pain has not completely resolved. He has had some bowel movements, and no longer feels constipated  General: Caucasian male, No acute distress  HEENT: NCAT, MMM  Cardiovascular: RRR nl S1, S2 no mrg, 2+ pulses, warm extremities  Respiratory: CTAB, no increased WOB  Abdomen: NABS, soft, nondistended, and very mild tenderness in the medial left upper quadrant to deep palpation. No rebound or guarding. MSK: Normal tone and bulk, no LEE  Neuro: Grossly intact   Discharge Instructions      Discharge Orders   Future Orders Complete By Expires  Call MD for:  difficulty breathing, headache or visual disturbances  As directed     Call MD for:  extreme fatigue  As directed     Call MD for:  hives  As directed     Call MD for:  persistant dizziness or light-headedness  As directed     Call MD for:  persistant nausea and vomiting  As directed     Call MD for:  severe uncontrolled pain  As  directed     Call MD for:  temperature >100.4  As directed     Diet - low sodium heart healthy  As directed     Discharge instructions  As directed     Comments:      You were hospitalized with jejunal diverticulitis with a microperforation. You were started on broad-spectrum antibiotics and IV fluids. You were seen by the general surgeons who would like to followup with you in clinic within one week. Please call the phone number provided to schedule your appointment.  Please take ciprofloxacin and metronidazole as prescribed until all the pills are gone.  Your next doses should be this evening.    Increase activity slowly  As directed         Medication List    TAKE these medications       amLODipine 5 MG tablet  Commonly known as:  NORVASC  Take 1 tablet (5 mg total) by mouth daily.     aspirin 325 MG tablet  Take 325 mg by mouth daily.     buPROPion 300 MG 24 hr tablet  Commonly known as:  WELLBUTRIN XL  Take 300 mg by mouth daily.     carvedilol 6.25 MG tablet  Commonly known as:  COREG  Take 1 tablet (6.25 mg total) by mouth 2 (two) times daily with a meal. 1 by mouth twice daily     ciprofloxacin 500 MG tablet  Commonly known as:  CIPRO  Take 1 tablet (500 mg total) by mouth 2 (two) times daily.     fluticasone 50 MCG/ACT nasal spray  Commonly known as:  FLONASE  Place 2 sprays into the nose daily. 1-2 sprays each nostril daily *seasonal*     lamoTRIgine 150 MG tablet  Commonly known as:  LAMICTAL  Take 150 mg by mouth 2 (two) times daily.     methylphenidate 10 MG tablet  Commonly known as:  RITALIN  1 in am & 2 at noon     metroNIDAZOLE 500 MG tablet  Commonly known as:  FLAGYL  Take 1 tablet (500 mg total) by mouth 3 (three) times daily.     multivitamin with minerals tablet  Take 1 tablet by mouth daily.     omeprazole 20 MG capsule  Commonly known as:  PRILOSEC  Take 20 mg by mouth daily.     PROBIOTIC DAILY PO  Take 1 tablet by mouth daily.      promethazine 12.5 MG tablet  Commonly known as:  PHENERGAN  Take 1 tablet (12.5 mg total) by mouth every 6 (six) hours as needed for nausea.     ramipril 10 MG capsule  Commonly known as:  ALTACE  Take 1 capsule (10 mg total) by mouth daily.     rosuvastatin 20 MG tablet  Commonly known as:  CRESTOR  Take 20 mg by mouth daily. Take on Sunday Monday Wednesday and Friday.       Follow-up Information   Follow up with Marga Melnick,  MD. Schedule an appointment as soon as possible for a visit in 2 weeks.   Contact information:   [address] [facility] [address] [place] [address] [phone]       Follow up with Judie Petit T. Russella Dar, MD. Schedule an appointment as soon as possible for a visit in 2 weeks.   Contact information:   [address] [place] [address] [phone]       Follow up with Ardeth Sportsman., MD. Schedule an appointment as soon as possible for a visit in 1 week.   Contact information:   [address] [place] [address] [phone]        The results of significant diagnostics from this hospitalization (including imaging, microbiology, ancillary and laboratory) are listed below for reference.    Significant Diagnostic Studies: Ct Abdomen Pelvis Wo Contrast  08/27/2012   *RADIOLOGY REPORT*  Clinical Data: Epigastric pain, nausea.  Recent diverticulitis.  CT ABDOMEN AND PELVIS WITHOUT CONTRAST  Technique:  Multidetector CT imaging of the abdomen and pelvis was performed following the standard protocol without intravenous contrast.  Comparison: Plain films earlier today.  CT 10/01/2003.  Findings: Lung bases are clear.  No effusions.  Heart is normal size.  Liver, gallbladder, spleen, pancreas, adrenals and kidneys have an unremarkable unenhanced appearance.  Marked inflammatory process noted within the jejunal small bowel mesentery with associated wall thickening in multiple loops of jejunum.  There is a large jejunal diverticulum  noted on image 40 of series 2.  On the same image, there appears to be a small locule of extraluminal gas within the inflamed mesentery.  Findings most likely represent a jejunal diverticulitis.  There are a few scattered small reactive mesenteric lymph nodes in this area.  Descending colonic and sigmoid diverticulosis.  Wall thickening in the sigmoid colon compatible with diverticulosis.  No evidence of colonic diverticulitis.  Small amount of free fluid in the pelvis.  Appendix is visualized and is normal.  4.3 cm duodenal diverticulum adjacent to the second and third portions of the duodenum.  Stomach is grossly unremarkable.  IMPRESSION: Marked inflammatory process involving several jejunal small bowel loops and the jejunal mesentery.  There appears to be a large jejunal diverticulum in this area and findings most likely reflect a jejunal diverticulitis.  I believe I see at least one small locule of extraluminal gas suggesting micro perforation.  Adjacent reactive mesenteric lymph nodes.  These results were called to Dr. Jeraldine Loots at the time of interpretation.   Original Report Authenticated By: Charlett Nose, M.D.   Abd 1 View (kub)  08/28/2012   *RADIOLOGY REPORT*  Clinical Data: Abdominal pain, recent diverticulitis and inflammatory process of bowel.  ABDOMEN - 1 VIEW  Comparison: CT of the abdomen and pelvis on 08/27/2012.  Findings: Contrast administered for CT yesterday shows transit into the colon.  There is no evidence of bowel obstruction or significant ileus.  No abnormal calcifications are identified.  IMPRESSION: No evidence of bowel obstruction or ileus.   Original Report Authenticated By: Irish Lack, M.D.   Dg Abd Acute W/chest  08/27/2012   *RADIOLOGY REPORT*  Clinical Data: Rule out free air  ACUTE ABDOMEN SERIES (ABDOMEN 2 VIEW & CHEST 1 VIEW)  Comparison: 04/24/2010  Findings: Normal heart size.  There is no pleural effusion or edema.  No airspace consolidation identified.  Moderate  stool burden identified within the colon.  No dilated loops of small bowel or air-fluid levels identified.  No  free intraperitoneal air is identified.  No abnormal abdominal or pelvic calcifications.  IMPRESSION:  1.  No acute abnormality noted. 2.  Moderate stool burden within the colon.   Original Report Authenticated By: Signa Kell, M.D.    Microbiology: No results found for this or any previous visit (from the past 240 hour(s)).   Labs: Basic Metabolic Panel:  Recent Labs Lab 08/27/12 1535 08/28/12 1135 08/29/12 0535  NA 141 135 136  K 4.3 4.1 4.1  CL 107 100 97  CO2  --  26 29  GLUCOSE 91 102* 109*  BUN 12 10 9   CREATININE 0.90 0.94 1.02  CALCIUM  --  9.0 9.1   Liver Function Tests:  Recent Labs Lab 08/24/12 1024 08/27/12 1346  AST 30 16  ALT 49 35  ALKPHOS  --  70  BILITOT  --  0.4  PROT  --  7.0  ALBUMIN  --  4.1    Recent Labs Lab 08/27/12 1346  LIPASE 30   No results found for this basename: AMMONIA,  in the last 168 hours CBC:  Recent Labs Lab 08/27/12 1346 08/27/12 1535 08/28/12 1135 08/29/12 0535  WBC 15.3*  --  12.0* 9.8  NEUTROABS 11.4*  --   --   --   HGB 15.4 14.3 13.4 13.2  HCT 43.4 42.0 39.9 39.1  MCV 85.1  --  86.7 87.3  PLT 243  --  216 197   Cardiac Enzymes: No results found for this basename: CKTOTAL, CKMB, CKMBINDEX, TROPONINI,  in the last 168 hours BNP: BNP (last 3 results) No results found for this basename: PROBNP,  in the last 8760 hours CBG: No results found for this basename: GLUCAP,  in the last 168 hours  Time coordinating discharge: 45 minutes  Signed:  Pearley Millington  [facility] 08/30/2012, 11:00 AM

## 2012-08-31 ENCOUNTER — Telehealth (INDEPENDENT_AMBULATORY_CARE_PROVIDER_SITE_OTHER): Payer: Self-pay | Admitting: General Surgery

## 2012-08-31 DIAGNOSIS — K5732 Diverticulitis of large intestine without perforation or abscess without bleeding: Secondary | ICD-10-CM

## 2012-08-31 NOTE — Telephone Encounter (Signed)
Patient called and was needing to set up a Consult/Follow-Up with Dr.Gross. Patient is available M/W. T/TH he is out of town. I did see that there was an appointment 7/8 for a New Patient, but patient will be out of town on vacation from 7/4-7/13. Patient did say Wednesday 7/2 works best for him. There was a appointment but it was for LTFU. I explained to patient that I would send Dr.Gross' Assistant a message and she would get in contact him back. Patient's best contact number [phone]

## 2012-09-01 ENCOUNTER — Telehealth (INDEPENDENT_AMBULATORY_CARE_PROVIDER_SITE_OTHER): Payer: Self-pay | Admitting: General Surgery

## 2012-09-01 NOTE — Telephone Encounter (Signed)
Called pt to give him f/u appt with Dr Michaell Cowing on 09/09/12 to recheck diverticulitis. The pt will need a repeat CT Abd/Pelvis w/contrast done on 6/30 before his appt on 7/2 per Dr Michaell Cowing. The pt understands. We will email pt his instructions with his appt date for CT.

## 2012-09-01 NOTE — Telephone Encounter (Signed)
CT abdomen and pelvis set up for patient and emailed to him per his request.  "We have your CT abdomen/pelvis scan set up at [facility] - [address] [place] on Monday 09/07/2012 at 12:00 pm to arrive at 11:45 am. No solid food 4 hours prior to this appt. You will drink your contrast - one bottle at 10:00 am - one bottle at 11:00 am. If you have any questions you can contact [facility] directly at [phone]."

## 2012-09-07 ENCOUNTER — Encounter (HOSPITAL_COMMUNITY): Payer: Self-pay | Admitting: Psychiatry

## 2012-09-07 ENCOUNTER — Ambulatory Visit (INDEPENDENT_AMBULATORY_CARE_PROVIDER_SITE_OTHER): Payer: 59 | Admitting: Psychiatry

## 2012-09-07 ENCOUNTER — Ambulatory Visit
Admission: RE | Admit: 2012-09-07 | Discharge: 2012-09-07 | Disposition: A | Discharge status: Home / Self Care | Payer: 59 | Source: Ambulatory Visit | Attending: Surgery | Admitting: Surgery

## 2012-09-07 VITALS — BP 127/88 | HR 84 | Ht 73.0 in | Wt 222.0 lb

## 2012-09-07 DIAGNOSIS — K5732 Diverticulitis of large intestine without perforation or abscess without bleeding: Secondary | ICD-10-CM

## 2012-09-07 DIAGNOSIS — F988 Other specified behavioral and emotional disorders with onset usually occurring in childhood and adolescence: Secondary | ICD-10-CM

## 2012-09-07 DIAGNOSIS — F39 Unspecified mood [affective] disorder: Secondary | ICD-10-CM

## 2012-09-07 MED ORDER — IOHEXOL 300 MG/ML  SOLN
125.0000 mL | Freq: Once | INTRAMUSCULAR | Status: AC | PRN
  Administered 2012-09-07: 125 mL via INTRAVENOUS

## 2012-09-07 MED ORDER — METHYLPHENIDATE HCL 20 MG PO TABS: ORAL_TABLET | ORAL | Status: DC

## 2012-09-07 NOTE — Progress Notes (Signed)
[facility] [id number] Progress Note  Dakota Vang [id number] 47 y.o.  09/07/2012 4:21 PM  Chief Complaint:  I was very sick last week.      History of Present Illness:  Patient is 47 year old Caucasian married employed male who came for his followup appointment.  Patient endorse he was very sick and diagnosed with diverticulitis in [place].  He was given antibiotic and he was scheduled to have surgery but he started to get better.  He was given antibiotic which he recently finished.  He still has some weakness but overall he is doing better.  He had CT scan today and he has scheduled to see his GI doctor this week.  He is compliant with his psychiatric medication.  Patient endorse difficulty multitasking.  Last time he requested to increase his stimulant however we defer any changes because his blood pressure was increased.  Now he is taking antihypertensive medication regularly.  His blood pressure is much improved.  Denies any headache, palpitations, chest pain, insomnia or any irritability.  He is complying with his Lamictal .  He has any rash or itching.  Is not drinking or using any illegal substance.  He has any paranoia or any hallucination.  Suicidal Ideation: No Plan Formed: No Patient has means to carry out plan: No  Homicidal Ideation: No Plan Formed: No Patient has means to carry out plan: No  Review of Systems: Psychiatric: Agitation: No Hallucination: No Depressed Mood: No Insomnia: No Hypersomnia: Yes Altered Concentration: No Feels Worthless: No Grandiose Ideas: No Belief In Special Powers: No New/Increased Substance Abuse: No Compulsions: No  Neurologic: Headache: No Seizure: No Paresthesias: No  Medical History. Patient has multiple medical problems including diverticulitis, hyperlipidemia, hypertension, back pain and GERD.  His primary care physician is Marga Melnick .    Family and Social History: Patient endorse mother has depression and  history of psychiatric inpatient treatment.  Patient is born and raised in [place].  He has a good childhood.  He's been living in [place] for past few years.  He's been married for more than 11 years.  He has 3 children.  He has a good social network.  He denies any history of abuse in the past.  Alcohol and substance use history. Patient admitted history of DWI and drinking alcohol in the past however when to be sober since he is taking Ritalin.  Outpatient Encounter Prescriptions as of 09/07/2012  Medication Sig Dispense Refill  . buPROPion (WELLBUTRIN XL) 300 MG 24 hr tablet Take 300 mg by mouth daily.      Marland Kitchen lamoTRIgine (LAMICTAL) 150 MG tablet Take 150 mg by mouth 2 (two) times daily.      . methylphenidate (RITALIN) 20 MG tablet 1 in am & at noon  60 tablet  0  . ramipril (ALTACE) 10 MG capsule Take 1 capsule (10 mg total) by mouth daily.  90 capsule  1  . rosuvastatin (CRESTOR) 20 MG tablet Take 20 mg by mouth daily. Take on Sunday Monday Wednesday and Friday.      . [DISCONTINUED] methylphenidate (RITALIN) 10 MG tablet 1 in am & 2 at noon  90 tablet  0  . amLODipine (NORVASC) 5 MG tablet Take 1 tablet (5 mg total) by mouth daily.  90 tablet  1  . aspirin 325 MG tablet Take 325 mg by mouth daily.      . carvedilol (COREG) 6.25 MG tablet Take 1 tablet (6.25 mg total) by mouth 2 (two) times  daily with a meal. 1 by mouth twice daily  180 tablet  1  . ciprofloxacin (CIPRO) 500 MG tablet Take 1 tablet (500 mg total) by mouth 2 (two) times daily.  8 tablet  0  . fluticasone (FLONASE) 50 MCG/ACT nasal spray Place 2 sprays into the nose daily. 1-2 sprays each nostril daily *seasonal*  48 g  1  . metroNIDAZOLE (FLAGYL) 500 MG tablet Take 1 tablet (500 mg total) by mouth 3 (three) times daily.  12 tablet  0  . Multiple Vitamins-Minerals (MULTIVITAMIN WITH MINERALS) tablet Take 1 tablet by mouth daily.      Marland Kitchen omeprazole (PRILOSEC) 20 MG capsule Take 20 mg by mouth daily.      . Probiotic Product  (PROBIOTIC DAILY PO) Take 1 tablet by mouth daily.      . promethazine (PHENERGAN) 12.5 MG tablet Take 1 tablet (12.5 mg total) by mouth every 6 (six) hours as needed for nausea.  30 tablet  0   No facility-administered encounter medications on file as of 09/07/2012.    Past Psychiatric History/Hospitalization(s): Anxiety: Yes Bipolar Disorder: No Depression: Patient has history of depression and mood swing for a long time.  He start taking psychotropic medication by his primary care physician Dr. Marga Melnick.  In the past she was given Prozac but limited response.  He is taking Wellbutrin XL 300 mg from his primary care physician. Mania: No Psychosis: No Schizophrenia: No Personality Disorder: No Hospitalization for psychiatric illness: No History of Electroconvulsive Shock Therapy: No Prior Suicide Attempts: No  Physical Exam: Constitutional:  BP 127/88  Pulse 84  Ht 6\' 1"  (1.854 m)  Wt 222 lb (100.699 kg)  BMI 29.3 kg/m2  General Appearance: well nourished and obese  Musculoskeletal: Strength & Muscle Tone: within normal limits Gait & Station: normal Patient leans: N/A  Psychiatric: Speech (describe rate, volume, coherence, spontaneity, and abnormalities if any): clear and coherent with normal tone volume.  Thought Process (describe rate, content, abstract reasoning, and computation): Logical and goal-directed.  No flight of ideas or any loose association.  Associations: Coherent, Relevant and Intact  Thoughts: normal  Mental Status: Orientation: oriented to person, place, time/date and situation Mood & Affect: Tired and affect is mood appropriate. Attention Span & Concentration: Good  Medical Decision Making (Choose Three): Established Problem, Stable/Improving (1), Review or order clinical lab tests (1), Established Problem, Worsening (2), Review of Last Therapy Session (1), Review of Medication Regimen & Side Effects (2) and Review of New Medication or Change in  Dosage (2)  Assessment: Axis I: Mood disorder NOS, attention deficit disorder.  Axis II: Deferred  Axis III:  Patient Active Problem List   Diagnosis Date Noted  . Diverticulitis of jejunum with microperforation 08/28/2012  . Duodenal diverticulum 08/28/2012  . Leukocytosis 08/27/2012  . Constipation 08/27/2012  . DIVERTICULITIS, HX OF 06/24/2008  . ADD 11/20/2007  . COLONIC POLYPS, HX OF 11/20/2007  . HYPERLIPIDEMIA 06/12/2007  . HYPERTENSION 06/12/2007  . HEMORRHOIDS, INTERNAL 11/27/2006  . G E R D 09/19/2006    Axis IV: Mild to moderate  Axis V: 65-70   Plan:  I review his blood work, progress note and current medication.  We will try Ritalin 20 mg 1 in the morning and 1 at noon.  I explain in detail risk and benefits of medication including irritability and insomnia with stimulant.  I recommend to call us back it is a question of conservatively worsening of the symptom.  He scheduled to see  his GI doctor next week for his followup .  He had CT scan today .  I will see him again in 3 months.  Time spent 25 minutes.  More than 50% of the time spent and psychoeducation, counseling and coordination of care.  A new prescription of Ritalin 20 mg #60 his given.  Patient still has refill remaining on Lamictal.  Patient is getting Wellbutrin from his primary care physician.  Portion of this note is generated with voice dictation software and may contain typographical error.  Caley Ciaramitaro T., MD 09/07/2012

## 2012-09-09 ENCOUNTER — Encounter: Payer: Self-pay | Admitting: Internal Medicine

## 2012-09-09 ENCOUNTER — Encounter (INDEPENDENT_AMBULATORY_CARE_PROVIDER_SITE_OTHER): Payer: Self-pay | Admitting: Surgery

## 2012-09-09 ENCOUNTER — Ambulatory Visit (INDEPENDENT_AMBULATORY_CARE_PROVIDER_SITE_OTHER): Payer: 59 | Admitting: Surgery

## 2012-09-09 VITALS — BP 124/88 | HR 84 | Temp 98.2°F | Resp 14 | Ht 73.0 in | Wt 216.6 lb

## 2012-09-09 DIAGNOSIS — K5900 Constipation, unspecified: Secondary | ICD-10-CM

## 2012-09-09 DIAGNOSIS — K5730 Diverticulosis of large intestine without perforation or abscess without bleeding: Secondary | ICD-10-CM | POA: Insufficient documentation

## 2012-09-09 DIAGNOSIS — K5732 Diverticulitis of large intestine without perforation or abscess without bleeding: Secondary | ICD-10-CM

## 2012-09-09 DIAGNOSIS — K5712 Diverticulitis of small intestine without perforation or abscess without bleeding: Secondary | ICD-10-CM

## 2012-09-09 DIAGNOSIS — K5710 Diverticulosis of small intestine without perforation or abscess without bleeding: Secondary | ICD-10-CM

## 2012-09-09 HISTORY — DX: Diverticulosis of large intestine without perforation or abscess without bleeding: K57.30

## 2012-09-09 MED ORDER — CIPROFLOXACIN HCL 500 MG PO TABS: 500.0000 mg | ORAL_TABLET | Freq: Two times a day (BID) | ORAL | Status: DC

## 2012-09-09 MED ORDER — METRONIDAZOLE 500 MG PO TABS: 500.0000 mg | ORAL_TABLET | Freq: Three times a day (TID) | ORAL | Status: DC

## 2012-09-09 NOTE — Patient Instructions (Addendum)
See the Handout(s) we gave you.  Consider surgery to remove the part of small intestine containing the diverticulum.  Please call our office at [phone] if you wish to schedule surgery or if you have further questions / concerns.   Diverticulitis A diverticulum is a small pouch or sac on the colon. Diverticulosis is the presence of these diverticula on the colon. Diverticulitis is the irritation (inflammation) or infection of diverticula. CAUSES  The colon and its diverticula contain bacteria. If food particles block the tiny opening to a diverticulum, the bacteria inside can grow and cause an increase in pressure. This leads to infection and inflammation and is called diverticulitis. SYMPTOMS   Abdominal pain and tenderness. Usually, the pain is located on the left side of your abdomen. However, it could be located elsewhere.  Fever.  Bloating.  Feeling sick to your stomach (nausea).  Throwing up (vomiting).  Abnormal stools. DIAGNOSIS  Your caregiver will take a history and perform a physical exam. Since many things can cause abdominal pain, other tests may be necessary. Tests may include:  Blood tests.  Urine tests.  X-ray of the abdomen.  CT scan of the abdomen. Sometimes, surgery is needed to determine if diverticulitis or other conditions are causing your symptoms. TREATMENT  Most of the time, you can be treated without surgery. Treatment includes:  Resting the bowels by only having liquids for a few days. As you improve, you will need to eat a low-fiber diet.  Intravenous (IV) fluids if you are losing body fluids (dehydrated).  Antibiotic medicines that treat infections may be given.  Pain and nausea medicine, if needed.  Surgery if the inflamed diverticulum has burst. HOME CARE INSTRUCTIONS   Try a clear liquid diet (broth, tea, or water for as long as directed by your caregiver). You may then gradually begin a low-fiber diet as tolerated.  A low-fiber  diet is a diet with less than 10 grams of fiber. Choose the foods below to reduce fiber in the diet:  White breads, cereals, rice, and pasta.  Cooked fruits and vegetables or soft fresh fruits and vegetables without the skin.  Ground or well-cooked tender beef, ham, veal, lamb, pork, or poultry.  Eggs and seafood.  After your diverticulitis symptoms have improved, your caregiver may put you on a high-fiber diet. A high-fiber diet includes 14 grams of fiber for every 1000 calories consumed. For a standard 2000 calorie diet, you would need 28 grams of fiber. Follow these diet guidelines to help you increase the fiber in your diet. It is important to slowly increase the amount fiber in your diet to avoid gas, constipation, and bloating.  Choose whole-grain breads, cereals, pasta, and brown rice.  Choose fresh fruits and vegetables with the skin on. Do not overcook vegetables because the more vegetables are cooked, the more fiber is lost.  Choose more nuts, seeds, legumes, dried peas, beans, and lentils.  Look for food products that have greater than 3 grams of fiber per serving on the Nutrition Facts label.  Take all medicine as directed by your caregiver.  If your caregiver has given you a follow-up appointment, it is very important that you go. Not going could result in lasting (chronic) or permanent injury, pain, and disability. If there is any problem keeping the appointment, call to reschedule. SEEK MEDICAL CARE IF:   Your pain does not improve.  You have a hard time advancing your diet beyond clear liquids.  Your bowel movements do not  return to normal. SEEK IMMEDIATE MEDICAL CARE IF:   Your pain becomes worse.  You have an oral temperature above 102 F (38.9 C), not controlled by medicine.  You have repeated vomiting.  You have bloody or black, tarry stools.  Symptoms that brought you to your caregiver become worse or are not getting better. MAKE SURE YOU:   Understand  these instructions.  Will watch your condition.  Will get help right away if you are not doing well or get worse. Document Released: 12/05/2004 Document Revised: 05/20/2011 Document Reviewed: 04/02/2010 [facility] Patient Information 2014 [place], [place].  GETTING TO GOOD BOWEL HEALTH. Irregular bowel habits such as constipation and diarrhea can lead to many problems over time.  Having one soft bowel movement a day is the most important way to prevent further problems.  The anorectal canal is designed to handle stretching and feces to safely manage our ability to get rid of solid waste (feces, poop, stool) out of our body.  BUT, hard constipated stools can act like ripping concrete bricks and diarrhea can be a burning fire to this very sensitive area of our body, causing inflamed hemorrhoids, anal fissures, increasing risk is perirectal abscesses, abdominal pain/bloating, an making irritable bowel worse.     The goal: ONE SOFT BOWEL MOVEMENT A DAY!  To have soft, regular bowel movements:    Drink at least 8 tall glasses of water a day.     Take plenty of fiber.  Fiber is the undigested part of plant food that passes into the colon, acting s "natures broom" to encourage bowel motility and movement.  Fiber can absorb and hold large amounts of water. This results in a larger, bulkier stool, which is soft and easier to pass. Work gradually over several weeks up to 6 servings a day of fiber (25g a day even more if needed) in the form of: o Vegetables -- Root (potatoes, carrots, turnips), leafy green (lettuce, salad greens, celery, spinach), or cooked high residue (cabbage, broccoli, etc) o Fruit -- Fresh (unpeeled skin & pulp), Dried (prunes, apricots, cherries, etc ),  or stewed ( applesauce)  o Whole grain breads, pasta, etc (whole wheat)  o Bran cereals    Bulking Agents -- This type of water-retaining fiber generally is easily obtained each day by one of the following:  o Psyllium bran -- The psyllium  plant is remarkable because its ground seeds can retain so much water. This product is available as Metamucil, Konsyl, Effersyllium, Per Diem Fiber, or the less expensive generic preparation in drug and health food stores. Although labeled a laxative, it really is not a laxative.  o Methylcellulose -- This is another fiber derived from wood which also retains water. It is available as Citrucel. o Polyethylene Glycol - and "artificial" fiber commonly called Miralax or Glycolax.  It is helpful for people with gassy or bloated feelings with regular fiber o Flax Seed - a less gassy fiber than psyllium   No reading or other relaxing activity while on the toilet. If bowel movements take longer than 5 minutes, you are too constipated   AVOID CONSTIPATION.  High fiber and water intake usually takes care of this.  Sometimes a laxative is needed to stimulate more frequent bowel movements, but    Laxatives are not a good long-term solution as it can wear the colon out. o Osmotics (Milk of Magnesia, Fleets phosphosoda, Magnesium citrate, MiraLax, GoLytely) are safer than  o Stimulants (Senokot, Castor Oil, Dulcolax, Ex Lax)  o Do not take laxatives for more than 7days in a row.    IF SEVERELY CONSTIPATED, try a Bowel Retraining Program: o Do not use laxatives.  o Eat a diet high in roughage, such as bran cereals and leafy vegetables.  o Drink six (6) ounces of prune or apricot juice each morning.  o Eat two (2) large servings of stewed fruit each day.  o Take one (1) heaping tablespoon of a psyllium-based bulking agent twice a day. Use sugar-free sweetener when possible to avoid excessive calories.  o Eat a normal breakfast.  o Set aside 15 minutes after breakfast to sit on the toilet, but do not strain to have a bowel movement.  o If you do not have a bowel movement by the third day, use an enema and repeat the above steps.    Controlling diarrhea o Switch to liquids and simpler foods for a few days to  avoid stressing your intestines further. o Avoid dairy products (especially milk & ice cream) for a short time.  The intestines often can lose the ability to digest lactose when stressed. o Avoid foods that cause gassiness or bloating.  Typical foods include beans and other legumes, cabbage, broccoli, and dairy foods.  Every person has some sensitivity to other foods, so listen to our body and avoid those foods that trigger problems for you. o Adding fiber (Citrucel, Metamucil, psyllium, Miralax) gradually can help thicken stools by absorbing excess fluid and retrain the intestines to act more normally.  Slowly increase the dose over a few weeks.  Too much fiber too soon can backfire and cause cramping & bloating. o Probiotics (such as active yogurt, Align, etc) may help repopulate the intestines and colon with normal bacteria and calm down a sensitive digestive tract.  Most studies show it to be of mild help, though, and such products can be costly. o Medicines:   Bismuth subsalicylate (ex. Kayopectate, Pepto Bismol) every 30 minutes for up to 6 doses can help control diarrhea.  Avoid if pregnant.   Loperamide (Immodium) can slow down diarrhea.  Start with two tablets (4mg  total) first and then try one tablet every 6 hours.  Avoid if you are having fevers or severe pain.  If you are not better or start feeling worse, stop all medicines and call your doctor for advice o Call your doctor if you are getting worse or not better.  Sometimes further testing (cultures, endoscopy, X-ray studies, bloodwork, etc) may be needed to help diagnose and treat the cause of the diarrhea.  ABDOMINAL SURGERY: POST OP INSTRUCTIONS  1. DIET: Follow a light bland diet the first 24 hours after arrival home, such as soup, liquids, crackers, etc.  Be sure to include lots of fluids daily.  Avoid fast food or heavy meals as your are more likely to get nauseated.  Eat a low fat the next few days after surgery.   2. Take your  usually prescribed home medications unless otherwise directed. 3. PAIN CONTROL: a. Pain is best controlled by a usual combination of three different methods TOGETHER: i. Ice/Heat ii. Over the counter pain medication iii. Prescription pain medication b. Most patients will experience some swelling and bruising around the incisions.  Ice packs or heating pads (30-60 minutes up to 6 times a day) will help. Use ice for the first few days to help decrease swelling and bruising, then switch to heat to help relax tight/sore spots and speed recovery.  Some people prefer to use  ice alone, heat alone, alternating between ice & heat.  Experiment to what works for you.  Swelling and bruising can take several weeks to resolve.   c. It is helpful to take an over-the-counter pain medication regularly for the first few weeks.  Choose one of the following that works best for you: i. Naproxen (Aleve, etc)  Two 220mg  tabs twice a day ii. Ibuprofen (Advil, etc) Three 200mg  tabs four times a day (every meal & bedtime) iii. Acetaminophen (Tylenol, etc) 500-650mg  four times a day (every meal & bedtime) d. A  prescription for pain medication (such as oxycodone, hydrocodone, etc) should be given to you upon discharge.  Take your pain medication as prescribed.  i. If you are having problems/concerns with the prescription medicine (does not control pain, nausea, vomiting, rash, itching, etc), please call us [phone] to see if we need to switch you to a different pain medicine that will work better for you and/or control your side effect better. ii. If you need a refill on your pain medication, please contact your pharmacy.  They will contact our office to request authorization. Prescriptions will not be filled after 5 pm or on week-ends. 4. Avoid getting constipated.  Between the surgery and the pain medications, it is common to experience some constipation.  Increasing fluid intake and taking a fiber supplement (such as  Metamucil, Citrucel, FiberCon, MiraLax, etc) 1-2 times a day regularly will usually help prevent this problem from occurring.  A mild laxative (prune juice, Milk of Magnesia, MiraLax, etc) should be taken according to package directions if there are no bowel movements after 48 hours.   5. Watch out for diarrhea.  If you have many loose bowel movements, simplify your diet to bland foods & liquids for a few days.  Stop any stool softeners and decrease your fiber supplement.  Switching to mild anti-diarrheal medications (Kayopectate, Pepto Bismol) can help.  If this worsens or does not improve, please call us. 6. Wash / shower every day.  You may shower over the incision / wound.  Avoid baths until the skin is fully healed.  Continue to shower over incision(s) after the dressing is off. 7. Remove your waterproof bandages 5 days after surgery.  You may leave the incision open to air.  You may replace a dressing/Band-Aid to cover the incision for comfort if you wish. 8. ACTIVITIES as tolerated:   a. You may resume regular (light) daily activities beginning the next day-such as daily self-care, walking, climbing stairs-gradually increasing activities as tolerated.  If you can walk 30 minutes without difficulty, it is safe to try more intense activity such as jogging, treadmill, bicycling, low-impact aerobics, swimming, etc. b. Save the most intensive and strenuous activity for last such as sit-ups, heavy lifting, contact sports, etc  Refrain from any heavy lifting or straining until you are off narcotics for pain control.   c. DO NOT PUSH THROUGH PAIN.  Let pain be your guide: If it hurts to do something, don't do it.  Pain is your body warning you to avoid that activity for another week until the pain goes down. d. You may drive when you are no longer taking prescription pain medication, you can comfortably wear a seatbelt, and you can safely maneuver your car and apply brakes. e. Bonita Quin may have sexual intercourse  when it is comfortable.  9. FOLLOW UP in our office a. Please call [facility] at [phone] to set up an appointment to see your surgeon  in the office for a follow-up appointment approximately 1-2 weeks after your surgery. b. Make sure that you call for this appointment the day you arrive home to insure a convenient appointment time. 10. IF YOU HAVE DISABILITY OR FAMILY LEAVE FORMS, BRING THEM TO THE OFFICE FOR PROCESSING.  DO NOT GIVE THEM TO YOUR DOCTOR.   WHEN TO CALL us [phone]: 1. Poor pain control 2. Reactions / problems with new medications (rash/itching, nausea, etc)  3. Fever over 101.5 F (38.5 C) 4. Inability to urinate 5. Nausea and/or vomiting 6. Worsening swelling or bruising 7. Continued bleeding from incision. 8. Increased pain, redness, or drainage from the incision  The clinic staff is available to answer your questions during regular business hours (8:30am-5pm).  Please don't hesitate to call and ask to speak to one of our nurses for clinical concerns.   A surgeon from [facility] is always on call at the hospitals   If you have a medical emergency, go to the nearest emergency room or call 911.    [facility] [address], [place], [place]  [address] ? MAIN: [phone] ? TOLL FREE: [phone] ? FAX [phone] [email or web address]    [facility] PREP INSTRUCTIONS for Anal/Rectal Surgery:   Obtain what you need at a pharmacy of your choice:      A bottle of Milk of Magnesia   DAY PRIOR TO SURGERY:    Switch to drinking liquids or pureed foods only    1:00pm    o Take 2 oz (4 tablespoons) Milk of Magnesia.     Midnight:  Do not eat or drink anything after midnight the night before your surgery.   MORNING OF PROCEDURE:    Remember to not to drink or eat anything that morning   If you have questions or problems, please call [facility] [phone] speak to someone in the  clinic department at our office

## 2012-09-09 NOTE — Progress Notes (Addendum)
Subjective:     Patient ID: Dakota Vang, male   DOB: 08/01/1965, 47 y.o.   MRN: [id number]  HPI  Dakota Vang  11/22/1965 [id number]  Patient Care Team: William F Hopper, MD as PCP - General Malcolm T Stark, MD as Consulting Physician (Gastroenterology) Syed T Arfeen, MD as Consulting Physician (Psychiatry)  This patient is a 47 y.o.male who presents today for surgical evaluation   Reason for visit: Followup on jejunal diverticulitis  The pateint comes today with his wife.  He is feeling much better after his three day 2nd admission for contained jejunal perforation presumed to be be due to jejunal diverticulitis.  Discharged home 6/22.  He completed Cipro/Flagyl 2 days ago & feels fine.  Eating well.  Sticking to mainly a bland diet.  Activity & energy level good.  He is hoping to get up to [place] next week & wonders if that is OK.  He wonders if he really needs surgery.    Patient Active Problem List   Diagnosis Date Noted  . Diverticulitis of jejunum with microperforation 08/28/2012  . Duodenal diverticulum 08/28/2012  . Leukocytosis 08/27/2012  . Constipation 08/27/2012  . DIVERTICULITIS, HX OF 06/24/2008  . ADD 11/20/2007  . COLONIC POLYPS, HX OF 11/20/2007  . HYPERLIPIDEMIA 06/12/2007  . HYPERTENSION 06/12/2007  . HEMORRHOIDS, INTERNAL 11/27/2006  . G E R D 09/19/2006    Past Medical History  Diagnosis Date  . GERD (gastroesophageal reflux disease)   . Colon polyps 2008    Dr Stark  . Diverticulosis 2002    Dr Denham  . Hyperlipidemia   . Diverticulitis     PMH of  . ADD (attention deficit disorder with hyperactivity)     Dr. Arfien  . Anxiety     Dr. Arfien  . Hypertension     Past Surgical History  Procedure Laterality Date  . Tonsillectomy    . Lasik      bilaterally  . Colonoscopy w/ polypectomy  2002 & 2008    Dr. Stark; polyps X 2  . Colonoscopy  2013    Dr Stark    History   Social History  . Marital Status: Married     Spouse Name: N/A    Number of Children: N/A  . Years of Education: N/A   Occupational History  . Not on file.   Social History Main Topics  . Smoking status: Current Some Day Smoker    Types: Cigarettes  . Smokeless tobacco: Never Used     Comment: 1 cigar / month; previously up to 1/2 ppd for 5 years  . Alcohol Use: 3.6 - 4.8 oz/week    6-8 Cans of beer per week     Comment: Beer on weekend   . Drug Use: No  . Sexually Active: Not on file   Other Topics Concern  . Not on file   Social History Narrative  . No narrative on file    Family History  Problem Relation Age of Onset  . Colon cancer Father     in 50s  . Lung cancer Father     smoker; asbestos exposure  . Cancer Father     Mesothelioma  . Coronary artery disease Mother     CAGB X 2 & angio 3-4X  . Cancer Mother     Gallbladder  . Coronary artery disease Maternal Grandmother   . COPD Maternal Grandmother   . Heart attack Maternal Grandfather       late 60's  . Heart attack Paternal Grandfather 69  . Diabetes Neg Hx   . Stroke Neg Hx     Current Outpatient Prescriptions  Medication Sig Dispense Refill  . amLODipine (NORVASC) 5 MG tablet Take 1 tablet (5 mg total) by mouth daily.  90 tablet  1  . buPROPion (WELLBUTRIN XL) 300 MG 24 hr tablet Take 300 mg by mouth daily.      . carvedilol (COREG) 6.25 MG tablet Take 1 tablet (6.25 mg total) by mouth 2 (two) times daily with a meal. 1 by mouth twice daily  180 tablet  1  . fluticasone (FLONASE) 50 MCG/ACT nasal spray Place 2 sprays into the nose daily. 1-2 sprays each nostril daily *seasonal*  48 g  1  . lamoTRIgine (LAMICTAL) 150 MG tablet Take 150 mg by mouth 2 (two) times daily.      . methylphenidate (RITALIN) 20 MG tablet 1 in am & at noon  60 tablet  0  . omeprazole (PRILOSEC) 20 MG capsule Take 20 mg by mouth daily.      . Probiotic Product (PROBIOTIC DAILY PO) Take 1 tablet by mouth daily.      . ramipril (ALTACE) 10 MG capsule Take 1 capsule (10 mg  total) by mouth daily.  90 capsule  1  . rosuvastatin (CRESTOR) 20 MG tablet Take 20 mg by mouth daily. Take on Sunday Monday Wednesday and Friday.      . aspirin 325 MG tablet Take 325 mg by mouth daily.      . ciprofloxacin (CIPRO) 500 MG tablet Take 1 tablet (500 mg total) by mouth 2 (two) times daily.  8 tablet  0  . metroNIDAZOLE (FLAGYL) 500 MG tablet Take 1 tablet (500 mg total) by mouth 3 (three) times daily.  12 tablet  0  . Multiple Vitamins-Minerals (MULTIVITAMIN WITH MINERALS) tablet Take 1 tablet by mouth daily.      . promethazine (PHENERGAN) 12.5 MG tablet Take 1 tablet (12.5 mg total) by mouth every 6 (six) hours as needed for nausea.  30 tablet  0   No current facility-administered medications for this visit.     Allergies  Allergen Reactions  . Penicillins Other (See Comments)    ? Reaction @ 47 yrs old    BP 124/88  Pulse 84  Temp(Src) 98.2 F (36.8 C)  Resp 14  Ht 6' 1" (1.854 m)  Wt 216 lb 9.6 oz (98.249 kg)  BMI 28.58 kg/m2  CT scan from [facility] 08/08/12 revealed: "slightly dilated few proximal jejunal bowel segments with adjacent inflammation in the left abdomen. There is suggestion of microperforation @ this level.There is questionable tiny calcific density within the small bowel segment proximal to inflammation.Constellation of findings suspicious for proximal jejunal inflammation secondary to small bowel diverticulitis vs foreign body causing perforation w/o abscess.".    Ct Abdomen Pelvis Wo Contrast  08/27/2012   *RADIOLOGY REPORT*  Clinical Data: Epigastric pain, nausea.  Recent diverticulitis.  CT ABDOMEN AND PELVIS WITHOUT CONTRAST  Technique:  Multidetector CT imaging of the abdomen and pelvis was performed following the standard protocol without intravenous contrast.  Comparison: Plain films earlier today.  CT 10/01/2003.  Findings: Lung bases are clear.  No effusions.  Heart is normal size.  Liver, gallbladder, spleen, pancreas, adrenals and  kidneys have an unremarkable unenhanced appearance.  Marked inflammatory process noted within the jejunal small bowel mesentery with associated wall thickening in multiple loops of jejunum.  There is a large   jejunal diverticulum noted on image 40 of series 2.  On the same image, there appears to be a small locule of extraluminal gas within the inflamed mesentery.  Findings most likely represent a jejunal diverticulitis.  There are a few scattered small reactive mesenteric lymph nodes in this area.  Descending colonic and sigmoid diverticulosis.  Wall thickening in the sigmoid colon compatible with diverticulosis.  No evidence of colonic diverticulitis.  Small amount of free fluid in the pelvis.  Appendix is visualized and is normal.  4.3 cm duodenal diverticulum adjacent to the second and third portions of the duodenum.  Stomach is grossly unremarkable.  IMPRESSION: Marked inflammatory process involving several jejunal small bowel loops and the jejunal mesentery.  There appears to be a large jejunal diverticulum in this area and findings most likely reflect a jejunal diverticulitis.  I believe I see at least one small locule of extraluminal gas suggesting micro perforation.  Adjacent reactive mesenteric lymph nodes.  These results were called to Dr. Lockwood at the time of interpretation.   Original Report Authenticated By: Kevin Dover, M.D.   Abd 1 View (kub)  08/28/2012   *RADIOLOGY REPORT*  Clinical Data: Abdominal pain, recent diverticulitis and inflammatory process of bowel.  ABDOMEN - 1 VIEW  Comparison: CT of the abdomen and pelvis on 08/27/2012.  Findings: Contrast administered for CT yesterday shows transit into the colon.  There is no evidence of bowel obstruction or significant ileus.  No abnormal calcifications are identified.  IMPRESSION: No evidence of bowel obstruction or ileus.   Original Report Authenticated By: Glenn Yamagata, M.D.   Ct Abdomen Pelvis W Contrast  09/07/2012   *RADIOLOGY  REPORT*  Clinical Data: Evaluate diverticulitis.  The patient is currently on antibiotics.  CT ABDOMEN AND PELVIS WITH CONTRAST  Technique:  Multidetector CT imaging of the abdomen and pelvis was performed following the standard protocol during bolus administration of intravenous contrast.  Contrast: 125mL OMNIPAQUE IOHEXOL 300 MG/ML  SOLN  Comparison: CT of the abdomen and pelvis 08/27/2012.  Findings:  Lung Bases: Unremarkable.  Abdomen/Pelvis:  The appearance of the liver, gallbladder, pancreas, spleen, bilateral adrenal glands and bilateral kidneys is unremarkable.  A large duodenal diverticulum from the third portion of the duodenum is again noted measuring approximately 4.4 x 3.7 cm.  Compared to the prior examination, the inflammatory process in the small bowel mesentery associated with the jejunum has significantly improved, although there continues to be a haziness throughout the small bowel mesentery in this region, and a mild thickening of several small bowel loops in this region, likely related to persistent reactive inflammation.  Multiple small diverticulae from the jejunum are again noted.  No well-defined rim enhancing fluid collection is noted in this region to suggest a diverticular abscess at this time.  Normal appendix.  There are numerous colonic diverticula, most pronounced in the descending colon and sigmoid colon, without surrounding inflammatory changes to suggest acute diverticulitis at this time.  No significant volume of ascites.  No pneumoperitoneum.  No pathologic distension of small bowel.  No definite pathologic lymphadenopathy identified within the abdomen or pelvis.  Prostate gland and urinary bladder are unremarkable in appearance.  Small bilateral inguinal hernias containing only fat incidentally noted (unchanged).  Musculoskeletal: There are no aggressive appearing lytic or blastic lesions noted in the visualized portions of the skeleton.  IMPRESSION: 1.  There are numerous small  bowel diverticula are redemonstrated, as above.  Compared to the prior study, the inflammatory changes in the region   of the jejunal mesentery have significantly improved, but have not completely resolved.  Findings are compatible with a positive response to antibiotic therapy for underlying small bowel diverticulitis.  No diverticular abscess or other complicating features are identified on today's examination. 2.  In addition, there is extensive colonic diverticulosis without evidence of acute diverticulitis at this time. 3.  Normal appendix.   Original Report Authenticated By: Daniel Entrikin, M.D.   Dg Abd Acute W/chest  08/27/2012   *RADIOLOGY REPORT*  Clinical Data: Rule out free air  ACUTE ABDOMEN SERIES (ABDOMEN 2 VIEW & CHEST 1 VIEW)  Comparison: 04/24/2010  Findings: Normal heart size.  There is no pleural effusion or edema.  No airspace consolidation identified.  Moderate stool burden identified within the colon.  No dilated loops of small bowel or air-fluid levels identified.  No free intraperitoneal air is identified.  No abnormal abdominal or pelvic calcifications.  IMPRESSION:  1.  No acute abnormality noted. 2.  Moderate stool burden within the colon.   Original Report Authenticated By: Taylor Stroud, M.D.     Review of Systems  Constitutional: Negative for fever, chills and diaphoresis.  HENT: Negative for nosebleeds, sore throat, facial swelling, mouth sores, trouble swallowing and ear discharge.   Eyes: Negative for photophobia, discharge and visual disturbance.  Respiratory: Negative for choking, chest tightness, shortness of breath and stridor.   Cardiovascular: Negative for chest pain and palpitations.  Gastrointestinal: Negative for nausea, vomiting, abdominal pain, diarrhea, constipation, blood in stool, abdominal distention, anal bleeding and rectal pain.  Endocrine: Negative for cold intolerance and heat intolerance.  Genitourinary: Negative for dysuria, urgency, difficulty  urinating and testicular pain.  Musculoskeletal: Negative for myalgias, back pain, arthralgias and gait problem.  Skin: Negative for color change, pallor, rash and wound.  Allergic/Immunologic: Negative for environmental allergies and food allergies.  Neurological: Negative for dizziness, speech difficulty, weakness, numbness and headaches.  Hematological: Negative for adenopathy. Does not bruise/bleed easily.  Psychiatric/Behavioral: Negative for hallucinations, confusion and agitation.       Objective:   Physical Exam  Constitutional: He is oriented to person, place, and time. He appears well-developed and well-nourished. No distress.  HENT:  Head: Normocephalic.  Mouth/Throat: Oropharynx is clear and moist. No oropharyngeal exudate.  Eyes: Conjunctivae and EOM are normal. Pupils are equal, round, and reactive to light. No scleral icterus.  Neck: Normal range of motion. Neck supple. No tracheal deviation present.  Cardiovascular: Normal rate, regular rhythm and intact distal pulses.   Pulmonary/Chest: Effort normal and breath sounds normal. No respiratory distress.  Abdominal: Soft. He exhibits no distension. There is no tenderness. Hernia confirmed negative in the right inguinal area and confirmed negative in the left inguinal area.  Musculoskeletal: Normal range of motion. He exhibits no tenderness.  Lymphadenopathy:    He has no cervical adenopathy.       Right: No inguinal adenopathy present.       Left: No inguinal adenopathy present.  Neurological: He is alert and oriented to person, place, and time. No cranial nerve deficit. He exhibits normal muscle tone. Coordination normal.  Skin: Skin is warm and dry. No rash noted. He is not diaphoretic. No erythema. No pallor.  Psychiatric: He has a normal mood and affect. His behavior is normal. Judgment and thought content normal.       Assessment:     Recurrent/persistent jejunal diverticulitis     Plan:     At this point, I  again discussed surgery to remove the section   of the jejunum that contains the diverticulum.  The purpose of the antibiotics is to cool infection down to decrease the need of a larger SB resection, leak, fistula, abscess, wound infection, hernia, etc.  I am concerned that his risk of recurrent infection/perforation is high & it may not be managed by IV/PO ABX alone like these last 2 times.  The next perforation could be fatal or at the very least morbid in an emergent setting.  The best solution is to remove the risk by surgical resection.  I renewed his Cipro/Flagyl Rx and recommended that he start it back up if he feels any Sx & call me if not better/worse.    While he has has sigmoid diverticulitis in the past, he has not had any flares in several years.  It sounds more like constipation in that "they give something to drink in the ER that cleans me out & I am much better."  I recommended a low fat high fiber diet to help him out in avoiding problems with constipation.  He also has a duodenal diverticulum at D3/D4 but it is not causing problems, so I do not think that resection is warranted.    He wanted me to talk to his gastroenterologist, PCP, and other surgeons.  I tried to reassure him that I had d/w Dr Hopper while he was an inpatient, discussed with many of my partners before & again today (Drs Wilson, Ramirez, etc).  I noted that these doctors will get my note & I will try to reach them again.  Eventually, he seemed to understand and claimed that he trusted my judgment to recommend surgery.  I am hopeful that I can do this laparoscopically to provide a smaller incision & faster recovery,    The anatomy & physiology of the digestive tract was discussed.  The pathophysiology was discussed.  Natural history risks without surgery was discussed.   I feel the risks of no intervention will lead to serious problems that outweigh the operative risks; therefore, I recommended a partial resection of small  intestine to remove the pathology.  Laparoscopic & open techniques were discussed.   Risks such as bleeding, infection, abscess, leak, reoperation, possible ostomy, hernia, heart attack, death, and other risks were discussed.  I noted that these risks were much higher in emergency setting than electively.  I noted a good likelihood this will help address the problem.   Goals of post-operative recovery were discussed as well.  We will work to minimize complications.  An educational handout on the pathology was given as well.  Recent case report & literature review given to him as well which outlines recommendations for segmental resection in cases of complicated (diverticulitis/perforation) but not incidental jejunal diverticulitis.  Questions were answered.  The patient expresses understanding & wishes to proceed with surgery.      Addendum: The case and information have been reviewed by both the patient's primary care physician, Dr. Hopper, and his gastroenterologist, Dr. Stark.  Both are in agreement of surgical resection of the jejunum containing the diverticulitis. 

## 2012-09-18 ENCOUNTER — Other Ambulatory Visit (HOSPITAL_COMMUNITY): Payer: Self-pay | Admitting: Surgery

## 2012-09-18 NOTE — Patient Instructions (Addendum)
[name]  09/18/2012   Your procedure is scheduled on: 09-24-2012  THURSDAY  Report to [facility] at 530 AM.  Call this number if you have problems the morning of surgery [phone]   Remember: LIQUIDS Wednesday AS DIRECTED BY OFFICE FOR BOWEL PREP  NOTHING BY MOUTH After Midnight. Wednesday NIGHT     Take these medicines the morning of surgery with A SIP OF WATER: NORVASC, CARVEDILOL, WELLBUTRIN, OMEPRAZOLE, LAMICTAL                                SEE [place] PREPARING FOR SURGERY SHEET   Do not wear jewelry, make-up or nail polish.  Do not wear lotions, powders, or perfumes. You may wear deodorant.   Men may shave face and neck.  Do not bring valuables to the hospital. [facility] IS NOT RESPONSIBLE FOR VALUEABLES.  Contacts, dentures or bridgework may not be worn into surgery.  Leave suitcase in the car. After surgery it may be brought to your room.  For patients admitted to the hospital, checkout time is 11:00 AM the day of discharge.   Patients discharged the day of surgery will not be allowed to drive home.  Name and phone number of your driver:admission  Special Instructions: N/A   Please read over the following fact sheets that you were given: MRSA Information, blood fact sheet  Call Theodis Aguas RN pre op nurse if needed [phone]    FAILURE TO FOLLOW THESE INSTRUCTIONS MAY RESULT IN THE CANCELLATION OF YOUR SURGERY.  PATIENT SIGNATURE___________________________________________  NURSE SIGNATURE_____________________________________________

## 2012-09-21 ENCOUNTER — Encounter (HOSPITAL_COMMUNITY)
Admission: RE | Admit: 2012-09-21 | Discharge: 2012-09-21 | Disposition: A | Discharge status: Home / Self Care | Payer: 59 | Source: Ambulatory Visit | Attending: Surgery | Admitting: Surgery

## 2012-09-21 ENCOUNTER — Encounter (HOSPITAL_COMMUNITY): Payer: Self-pay | Admitting: Pharmacy Technician

## 2012-09-21 ENCOUNTER — Ambulatory Visit (HOSPITAL_COMMUNITY)
Admission: RE | Admit: 2012-09-21 | Discharge: 2012-09-21 | Disposition: A | Discharge status: Home / Self Care | Payer: 59 | Source: Ambulatory Visit | Attending: Surgery | Admitting: Surgery

## 2012-09-21 ENCOUNTER — Encounter (HOSPITAL_COMMUNITY): Payer: Self-pay

## 2012-09-21 DIAGNOSIS — R110 Nausea: Secondary | ICD-10-CM | POA: Diagnosis present

## 2012-09-21 DIAGNOSIS — Z01810 Encounter for preprocedural cardiovascular examination: Secondary | ICD-10-CM | POA: Insufficient documentation

## 2012-09-21 DIAGNOSIS — F909 Attention-deficit hyperactivity disorder, unspecified type: Secondary | ICD-10-CM | POA: Diagnosis present

## 2012-09-21 DIAGNOSIS — F17200 Nicotine dependence, unspecified, uncomplicated: Secondary | ICD-10-CM | POA: Diagnosis present

## 2012-09-21 DIAGNOSIS — I10 Essential (primary) hypertension: Secondary | ICD-10-CM | POA: Diagnosis present

## 2012-09-21 DIAGNOSIS — K630 Abscess of intestine: Secondary | ICD-10-CM | POA: Diagnosis present

## 2012-09-21 DIAGNOSIS — K5712 Diverticulitis of small intestine without perforation or abscess without bleeding: Principal | ICD-10-CM | POA: Diagnosis present

## 2012-09-21 DIAGNOSIS — T4275XA Adverse effect of unspecified antiepileptic and sedative-hypnotic drugs, initial encounter: Secondary | ICD-10-CM | POA: Diagnosis present

## 2012-09-21 DIAGNOSIS — K219 Gastro-esophageal reflux disease without esophagitis: Secondary | ICD-10-CM | POA: Diagnosis present

## 2012-09-21 DIAGNOSIS — K5710 Diverticulosis of small intestine without perforation or abscess without bleeding: Secondary | ICD-10-CM | POA: Insufficient documentation

## 2012-09-21 LAB — CBC
MCH: 28.8 pg (ref 26.0–34.0)
MCHC: 33.9 g/dL (ref 30.0–36.0)
MCV: 84.8 fL (ref 78.0–100.0)
Platelets: 206 10*3/uL (ref 150–400)
RDW: 13 % (ref 11.5–15.5)
WBC: 8.5 10*3/uL (ref 4.0–10.5)

## 2012-09-21 LAB — BASIC METABOLIC PANEL
CO2: 26 mEq/L (ref 19–32)
Calcium: 9.9 mg/dL (ref 8.4–10.5)
Creatinine, Ser: 0.82 mg/dL (ref 0.50–1.35)
Glucose, Bld: 121 mg/dL — ABNORMAL HIGH (ref 70–99)

## 2012-09-23 MED ORDER — CLINDAMYCIN PHOSPHATE 600 MG/50ML IV SOLN
600.0000 mg | INTRAVENOUS | Status: AC
  Administered 2012-09-24: 900 mg via INTRAVENOUS

## 2012-09-23 MED ORDER — GENTAMICIN SULFATE 40 MG/ML IJ SOLN
440.0000 mg | INTRAVENOUS | Status: AC
  Administered 2012-09-24: 440 mg via INTRAVENOUS
  Filled 2012-09-23: qty 11

## 2012-09-24 ENCOUNTER — Encounter (HOSPITAL_COMMUNITY): Payer: Self-pay | Admitting: Registered Nurse

## 2012-09-24 ENCOUNTER — Encounter (HOSPITAL_COMMUNITY): Payer: Self-pay | Admitting: *Deleted

## 2012-09-24 ENCOUNTER — Inpatient Hospital Stay (HOSPITAL_COMMUNITY)
Admission: RE | Admit: 2012-09-24 | Discharge: 2012-09-29 | DRG: 330 | Disposition: A | Discharge status: Home / Self Care | Payer: 59 | Source: Ambulatory Visit | Attending: Surgery | Admitting: Surgery

## 2012-09-24 ENCOUNTER — Inpatient Hospital Stay (HOSPITAL_COMMUNITY): Payer: 59 | Admitting: Registered Nurse

## 2012-09-24 ENCOUNTER — Encounter (HOSPITAL_COMMUNITY)
Admission: RE | Disposition: A | Discharge status: Home / Self Care | Payer: Self-pay | Source: Ambulatory Visit | Attending: Surgery

## 2012-09-24 DIAGNOSIS — K5712 Diverticulitis of small intestine without perforation or abscess without bleeding: Secondary | ICD-10-CM | POA: Diagnosis present

## 2012-09-24 DIAGNOSIS — K219 Gastro-esophageal reflux disease without esophagitis: Secondary | ICD-10-CM | POA: Diagnosis present

## 2012-09-24 DIAGNOSIS — I10 Essential (primary) hypertension: Secondary | ICD-10-CM | POA: Diagnosis present

## 2012-09-24 DIAGNOSIS — F988 Other specified behavioral and emotional disorders with onset usually occurring in childhood and adolescence: Secondary | ICD-10-CM | POA: Diagnosis present

## 2012-09-24 DIAGNOSIS — K5732 Diverticulitis of large intestine without perforation or abscess without bleeding: Secondary | ICD-10-CM

## 2012-09-24 DIAGNOSIS — K5900 Constipation, unspecified: Secondary | ICD-10-CM | POA: Diagnosis present

## 2012-09-24 HISTORY — DX: Personal history of colonic polyps: Z86.010

## 2012-09-24 HISTORY — DX: Diverticulitis of large intestine without perforation or abscess without bleeding: K57.32

## 2012-09-24 HISTORY — DX: Diverticulosis of large intestine without perforation or abscess without bleeding: K57.30

## 2012-09-24 HISTORY — PX: LAPAROSCOPIC PARTIAL COLECTOMY: SHX5907

## 2012-09-24 HISTORY — PX: LAPAROSCOPIC SMALL BOWEL RESECTION: SUR793

## 2012-09-24 HISTORY — DX: Diverticulosis of small intestine without perforation or abscess without bleeding: K57.10

## 2012-09-24 LAB — CBC
MCHC: 34.8 g/dL (ref 30.0–36.0)
Platelets: 197 10*3/uL (ref 150–400)
RDW: 13 % (ref 11.5–15.5)
WBC: 13.3 10*3/uL — ABNORMAL HIGH (ref 4.0–10.5)

## 2012-09-24 LAB — CREATININE, SERUM
Creatinine, Ser: 0.94 mg/dL (ref 0.50–1.35)
GFR calc Af Amer: >90 mL/min (ref >90)
GFR calc non Af Amer: >90 mL/min (ref >90)

## 2012-09-24 SURGERY — LAPAROSCOPIC PARTIAL COLECTOMY
Anesthesia: General | Site: Abdomen | Wound class: Contaminated

## 2012-09-24 MED ORDER — METHYLPHENIDATE HCL 5 MG PO TABS
20.0000 mg | ORAL_TABLET | Freq: Two times a day (BID) | ORAL | Status: DC
  Administered 2012-09-25 – 2012-09-29 (×9): 20 mg via ORAL
  Filled 2012-09-24 (×9): qty 4

## 2012-09-24 MED ORDER — FENTANYL CITRATE 0.05 MG/ML IJ SOLN
INTRAMUSCULAR | Status: DC | PRN
  Administered 2012-09-24: 100 ug via INTRAVENOUS
  Administered 2012-09-24 (×3): 50 ug via INTRAVENOUS

## 2012-09-24 MED ORDER — ACETAMINOPHEN 10 MG/ML IV SOLN
1000.0000 mg | Freq: Once | INTRAVENOUS | Status: DC | PRN
  Filled 2012-09-24: qty 100

## 2012-09-24 MED ORDER — OXYCODONE HCL 5 MG PO TABS: 5.0000 mg | ORAL_TABLET | Freq: Once | ORAL | Status: DC | PRN

## 2012-09-24 MED ORDER — AMLODIPINE BESYLATE 5 MG PO TABS
5.0000 mg | ORAL_TABLET | Freq: Every day | ORAL | Status: DC
  Administered 2012-09-25 – 2012-09-29 (×5): 5 mg via ORAL
  Filled 2012-09-24 (×6): qty 1

## 2012-09-24 MED ORDER — DIPHENHYDRAMINE HCL 12.5 MG/5ML PO ELIX
12.5000 mg | ORAL_SOLUTION | Freq: Four times a day (QID) | ORAL | Status: DC | PRN
  Filled 2012-09-24: qty 5

## 2012-09-24 MED ORDER — PROMETHAZINE HCL 25 MG/ML IJ SOLN
6.2500 mg | Freq: Four times a day (QID) | INTRAMUSCULAR | Status: DC | PRN
  Administered 2012-09-24 – 2012-09-25 (×4): 6.25 mg via INTRAVENOUS
  Administered 2012-09-25: 08:00:00 via INTRAVENOUS
  Administered 2012-09-26 – 2012-09-27 (×5): 6.25 mg via INTRAVENOUS
  Filled 2012-09-24 (×10): qty 1

## 2012-09-24 MED ORDER — METOPROLOL TARTRATE 1 MG/ML IV SOLN
5.0000 mg | Freq: Four times a day (QID) | INTRAVENOUS | Status: DC | PRN
  Filled 2012-09-24: qty 5

## 2012-09-24 MED ORDER — ACETAMINOPHEN 500 MG PO TABS
1000.0000 mg | ORAL_TABLET | Freq: Three times a day (TID) | ORAL | Status: DC
  Administered 2012-09-24 – 2012-09-28 (×11): 1000 mg via ORAL
  Filled 2012-09-24 (×13): qty 2
  Filled 2012-09-24: qty 1

## 2012-09-24 MED ORDER — PROPOFOL 10 MG/ML IV BOLUS
INTRAVENOUS | Status: DC | PRN
  Administered 2012-09-24: 200 mg via INTRAVENOUS

## 2012-09-24 MED ORDER — ALUM & MAG HYDROXIDE-SIMETH 200-200-20 MG/5ML PO SUSP
30.0000 mL | Freq: Four times a day (QID) | ORAL | Status: DC | PRN
  Administered 2012-09-25: 30 mL via ORAL
  Filled 2012-09-24: qty 30

## 2012-09-24 MED ORDER — BUPIVACAINE-EPINEPHRINE 0.25% -1:200000 IJ SOLN
INTRAMUSCULAR | Status: DC | PRN
  Administered 2012-09-24: 50 mL

## 2012-09-24 MED ORDER — BUPROPION HCL ER (XL) 300 MG PO TB24
300.0000 mg | ORAL_TABLET | Freq: Every day | ORAL | Status: DC
  Administered 2012-09-25 – 2012-09-29 (×5): 300 mg via ORAL
  Filled 2012-09-24 (×6): qty 1

## 2012-09-24 MED ORDER — CARVEDILOL 6.25 MG PO TABS
6.2500 mg | ORAL_TABLET | Freq: Two times a day (BID) | ORAL | Status: DC
  Administered 2012-09-24 – 2012-09-29 (×10): 6.25 mg via ORAL
  Filled 2012-09-24 (×12): qty 1

## 2012-09-24 MED ORDER — ONDANSETRON HCL 4 MG/2ML IJ SOLN
INTRAMUSCULAR | Status: DC | PRN
  Administered 2012-09-24: 4 mg via INTRAVENOUS

## 2012-09-24 MED ORDER — LACTATED RINGERS IV BOLUS (SEPSIS): 1000.0000 mL | Freq: Three times a day (TID) | INTRAVENOUS | Status: AC | PRN

## 2012-09-24 MED ORDER — HEPARIN SODIUM (PORCINE) 5000 UNIT/ML IJ SOLN
5000.0000 [IU] | Freq: Three times a day (TID) | INTRAMUSCULAR | Status: DC
  Administered 2012-09-25 – 2012-09-29 (×13): 5000 [IU] via SUBCUTANEOUS
  Filled 2012-09-24 (×16): qty 1

## 2012-09-24 MED ORDER — LAMOTRIGINE 150 MG PO TABS
150.0000 mg | ORAL_TABLET | Freq: Two times a day (BID) | ORAL | Status: DC
  Administered 2012-09-24 – 2012-09-29 (×10): 150 mg via ORAL
  Filled 2012-09-24 (×11): qty 1

## 2012-09-24 MED ORDER — ALVIMOPAN 12 MG PO CAPS
12.0000 mg | ORAL_CAPSULE | ORAL | Status: AC
  Administered 2012-09-24: 12 mg via ORAL
  Filled 2012-09-24: qty 1

## 2012-09-24 MED ORDER — MIDAZOLAM HCL 5 MG/5ML IJ SOLN
INTRAMUSCULAR | Status: DC | PRN
  Administered 2012-09-24: 2 mg via INTRAVENOUS

## 2012-09-24 MED ORDER — PROMETHAZINE HCL 25 MG/ML IJ SOLN: 6.2500 mg | INTRAMUSCULAR | Status: DC | PRN

## 2012-09-24 MED ORDER — NEOSTIGMINE METHYLSULFATE 1 MG/ML IJ SOLN
INTRAMUSCULAR | Status: DC | PRN
  Administered 2012-09-24: 4 mg via INTRAVENOUS

## 2012-09-24 MED ORDER — ASPIRIN EC 81 MG PO TBEC
81.0000 mg | DELAYED_RELEASE_TABLET | Freq: Every day | ORAL | Status: DC
  Administered 2012-09-24 – 2012-09-29 (×6): 81 mg via ORAL
  Filled 2012-09-24 (×6): qty 1

## 2012-09-24 MED ORDER — HYDROMORPHONE HCL PF 1 MG/ML IJ SOLN
0.5000 mg | INTRAMUSCULAR | Status: DC | PRN
  Administered 2012-09-24 (×2): 1 mg via INTRAVENOUS
  Administered 2012-09-24 – 2012-09-25 (×8): 2 mg via INTRAVENOUS
  Administered 2012-09-25: 1 mg via INTRAVENOUS
  Administered 2012-09-25: 2 mg via INTRAVENOUS
  Filled 2012-09-24 (×2): qty 2
  Filled 2012-09-24: qty 1
  Filled 2012-09-24 (×3): qty 2
  Filled 2012-09-24: qty 1
  Filled 2012-09-24: qty 2
  Filled 2012-09-24: qty 1
  Filled 2012-09-24 (×3): qty 2

## 2012-09-24 MED ORDER — KETOROLAC TROMETHAMINE 30 MG/ML IJ SOLN
INTRAMUSCULAR | Status: DC | PRN
  Administered 2012-09-24: 30 mg via INTRAVENOUS

## 2012-09-24 MED ORDER — LACTATED RINGERS IV SOLN: INTRAVENOUS | Status: DC

## 2012-09-24 MED ORDER — LIP MEDEX EX OINT
1.0000 "application " | TOPICAL_OINTMENT | Freq: Two times a day (BID) | CUTANEOUS | Status: DC
  Administered 2012-09-24 – 2012-09-28 (×8): 1 via TOPICAL
  Filled 2012-09-24: qty 7

## 2012-09-24 MED ORDER — ROCURONIUM BROMIDE 100 MG/10ML IV SOLN
INTRAVENOUS | Status: DC | PRN
  Administered 2012-09-24: 15 mg via INTRAVENOUS
  Administered 2012-09-24: 5 mg via INTRAVENOUS
  Administered 2012-09-24: 20 mg via INTRAVENOUS
  Administered 2012-09-24: 40 mg via INTRAVENOUS

## 2012-09-24 MED ORDER — ALVIMOPAN 12 MG PO CAPS
12.0000 mg | ORAL_CAPSULE | Freq: Two times a day (BID) | ORAL | Status: DC
  Administered 2012-09-25 – 2012-09-29 (×9): 12 mg via ORAL
  Filled 2012-09-24 (×10): qty 1

## 2012-09-24 MED ORDER — MEPERIDINE HCL 50 MG/ML IJ SOLN: 6.2500 mg | INTRAMUSCULAR | Status: DC | PRN

## 2012-09-24 MED ORDER — LACTATED RINGERS IV SOLN
INTRAVENOUS | Status: DC | PRN
  Administered 2012-09-24 (×2): via INTRAVENOUS

## 2012-09-24 MED ORDER — MAGIC MOUTHWASH
15.0000 mL | Freq: Four times a day (QID) | ORAL | Status: DC | PRN
  Filled 2012-09-24: qty 15

## 2012-09-24 MED ORDER — SODIUM CHLORIDE 0.9 % IV SOLN
INTRAVENOUS | Status: AC
  Administered 2012-09-24: 09:00:00 via INTRAPERITONEAL
  Filled 2012-09-24: qty 6

## 2012-09-24 MED ORDER — KCL IN DEXTROSE-NACL 40-5-0.9 MEQ/L-%-% IV SOLN
INTRAVENOUS | Status: DC
  Administered 2012-09-24: 14:00:00 via INTRAVENOUS
  Filled 2012-09-24 (×2): qty 1000

## 2012-09-24 MED ORDER — OXYCODONE HCL 5 MG/5ML PO SOLN
5.0000 mg | Freq: Once | ORAL | Status: DC | PRN
  Filled 2012-09-24: qty 5

## 2012-09-24 MED ORDER — METHYLPHENIDATE HCL 20 MG PO TABS: 20.0000 mg | ORAL_TABLET | Freq: Two times a day (BID) | ORAL | Status: DC

## 2012-09-24 MED ORDER — PANTOPRAZOLE SODIUM 40 MG PO TBEC
40.0000 mg | DELAYED_RELEASE_TABLET | Freq: Every day | ORAL | Status: DC
  Administered 2012-09-25 – 2012-09-29 (×5): 40 mg via ORAL
  Filled 2012-09-24 (×7): qty 1

## 2012-09-24 MED ORDER — NICOTINE 21 MG/24HR TD PT24
21.0000 mg | MEDICATED_PATCH | Freq: Every day | TRANSDERMAL | Status: DC
  Administered 2012-09-24 – 2012-09-29 (×6): 21 mg via TRANSDERMAL
  Filled 2012-09-24 (×7): qty 1

## 2012-09-24 MED ORDER — SACCHAROMYCES BOULARDII 250 MG PO CAPS
250.0000 mg | ORAL_CAPSULE | Freq: Two times a day (BID) | ORAL | Status: DC
  Administered 2012-09-24 – 2012-09-29 (×10): 250 mg via ORAL
  Filled 2012-09-24 (×11): qty 1

## 2012-09-24 MED ORDER — HYDROMORPHONE HCL PF 1 MG/ML IJ SOLN
INTRAMUSCULAR | Status: DC | PRN
  Administered 2012-09-24: 1 mg via INTRAVENOUS
  Administered 2012-09-24 (×3): 0.5 mg via INTRAVENOUS
  Administered 2012-09-24: 1 mg via INTRAVENOUS

## 2012-09-24 MED ORDER — DIPHENHYDRAMINE HCL 50 MG/ML IJ SOLN
12.5000 mg | Freq: Four times a day (QID) | INTRAMUSCULAR | Status: DC | PRN
  Administered 2012-09-24: 12.5 mg via INTRAVENOUS

## 2012-09-24 MED ORDER — RAMIPRIL 10 MG PO CAPS
10.0000 mg | ORAL_CAPSULE | Freq: Every day | ORAL | Status: DC
  Administered 2012-09-25 – 2012-09-29 (×5): 10 mg via ORAL
  Filled 2012-09-24 (×6): qty 1

## 2012-09-24 MED ORDER — PHENYLEPHRINE HCL 10 MG/ML IJ SOLN
INTRAMUSCULAR | Status: DC | PRN
  Administered 2012-09-24: 40 ug via INTRAVENOUS

## 2012-09-24 MED ORDER — BUPIVACAINE 0.25 % ON-Q PUMP DUAL CATH 300 ML
300.0000 mL | INJECTION | Status: DC
  Administered 2012-09-24: 300 mL
  Filled 2012-09-24: qty 300

## 2012-09-24 MED ORDER — GLYCOPYRROLATE 0.2 MG/ML IJ SOLN
INTRAMUSCULAR | Status: DC | PRN
  Administered 2012-09-24: .7 mg via INTRAVENOUS

## 2012-09-24 MED ORDER — METRONIDAZOLE IN NACL 5-0.79 MG/ML-% IV SOLN
500.0000 mg | Freq: Four times a day (QID) | INTRAVENOUS | Status: AC
  Administered 2012-09-24 – 2012-09-25 (×3): 500 mg via INTRAVENOUS
  Filled 2012-09-24 (×3): qty 100

## 2012-09-24 MED ORDER — BUPIVACAINE ON-Q PAIN PUMP (FOR ORDER SET NO CHG)
INJECTION | Status: DC
  Filled 2012-09-24: qty 1

## 2012-09-24 MED ORDER — HYDROMORPHONE HCL PF 1 MG/ML IJ SOLN
0.2500 mg | INTRAMUSCULAR | Status: DC | PRN
  Administered 2012-09-24 (×4): 0.5 mg via INTRAVENOUS

## 2012-09-24 MED ORDER — CLINDAMYCIN PHOSPHATE 900 MG/50ML IV SOLN
900.0000 mg | Freq: Four times a day (QID) | INTRAVENOUS | Status: AC
  Administered 2012-09-24 – 2012-09-25 (×3): 900 mg via INTRAVENOUS
  Filled 2012-09-24 (×3): qty 50

## 2012-09-24 MED ORDER — ZOLPIDEM TARTRATE 5 MG PO TABS: 5.0000 mg | ORAL_TABLET | Freq: Every evening | ORAL | Status: DC | PRN

## 2012-09-24 MED ORDER — OXYCODONE HCL 5 MG PO TABS: 5.0000 mg | ORAL_TABLET | ORAL | Status: DC | PRN

## 2012-09-24 MED ORDER — LIDOCAINE HCL (CARDIAC) 20 MG/ML IV SOLN
INTRAVENOUS | Status: DC | PRN
  Administered 2012-09-24: 50 mg via INTRAVENOUS

## 2012-09-24 MED ORDER — CLINDAMYCIN PHOSPHATE 900 MG/50ML IV SOLN: 900.0000 mg | INTRAVENOUS | Status: DC

## 2012-09-24 SURGICAL SUPPLY — 81 items
APPLIER CLIP 5 13 M/L LIGAMAX5 (MISCELLANEOUS)
APPLIER CLIP ROT 10 11.4 M/L (STAPLE)
BLADE EXTENDED COATED 6.5IN (ELECTRODE) IMPLANT
BLADE HEX COATED 2.75 (ELECTRODE) ×4 IMPLANT
BLADE SURG SZ10 CARB STEEL (BLADE) ×4 IMPLANT
CABLE HIGH FREQUENCY MONO STRZ (ELECTRODE) IMPLANT
CANISTER SUCTION 2500CC (MISCELLANEOUS) ×2 IMPLANT
CATH KIT ON Q 7.5IN SLV (PAIN MANAGEMENT) ×2 IMPLANT
CELLS DAT CNTRL 66122 CELL SVR (MISCELLANEOUS) IMPLANT
CLIP APPLIE 5 13 M/L LIGAMAX5 (MISCELLANEOUS) IMPLANT
CLIP APPLIE ROT 10 11.4 M/L (STAPLE) IMPLANT
CLOTH BEACON ORANGE TIMEOUT ST (SAFETY) ×2 IMPLANT
COVER MAYO STAND STRL (DRAPES) ×4 IMPLANT
DECANTER SPIKE VIAL GLASS SM (MISCELLANEOUS) ×2 IMPLANT
DRAIN CHANNEL 19F RND (DRAIN) IMPLANT
DRAPE LAPAROSCOPIC ABDOMINAL (DRAPES) ×2 IMPLANT
DRAPE LG THREE QUARTER DISP (DRAPES) ×2 IMPLANT
DRAPE UTILITY 15X26 (DRAPE) ×4 IMPLANT
DRAPE WARM FLUID 44X44 (DRAPE) ×2 IMPLANT
DRSG OPSITE POSTOP 4X8 (GAUZE/BANDAGES/DRESSINGS) ×2 IMPLANT
DRSG TEGADERM 2-3/8X2-3/4 SM (GAUZE/BANDAGES/DRESSINGS) ×6 IMPLANT
DRSG TEGADERM 4X4.75 (GAUZE/BANDAGES/DRESSINGS) ×2 IMPLANT
ELECT REM PT RETURN 9FT ADLT (ELECTROSURGICAL) ×2
ELECTRODE REM PT RTRN 9FT ADLT (ELECTROSURGICAL) ×1 IMPLANT
GLOVE BIOGEL PI IND STRL 7.0 (GLOVE) ×1 IMPLANT
GLOVE BIOGEL PI INDICATOR 7.0 (GLOVE) ×1
GLOVE ECLIPSE 8.0 STRL XLNG CF (GLOVE) ×8 IMPLANT
GLOVE INDICATOR 8.0 STRL GRN (GLOVE) ×4 IMPLANT
GOWN STRL NON-REIN LRG LVL3 (GOWN DISPOSABLE) IMPLANT
GOWN STRL REIN XL XLG (GOWN DISPOSABLE) ×16 IMPLANT
KIT BASIN OR (CUSTOM PROCEDURE TRAY) ×2 IMPLANT
LEGGING LITHOTOMY PAIR STRL (DRAPES) ×2 IMPLANT
LIGASURE IMPACT 36 18CM CVD LR (INSTRUMENTS) IMPLANT
LUBRICANT JELLY K Y 4OZ (MISCELLANEOUS) IMPLANT
NS IRRIG 1000ML POUR BTL (IV SOLUTION) ×4 IMPLANT
PENCIL BUTTON HOLSTER BLD 10FT (ELECTRODE) ×4 IMPLANT
RTRCTR WOUND ALEXIS 18CM MED (MISCELLANEOUS)
SCISSORS LAP 5X35 DISP (ENDOMECHANICALS) ×2 IMPLANT
SEALER TISSUE G2 CVD JAW 35 (ENDOMECHANICALS) IMPLANT
SEALER TISSUE G2 CVD JAW 45CM (ENDOMECHANICALS)
SEALER TISSUE G2 STRG ARTC 35C (ENDOMECHANICALS) ×2 IMPLANT
SET IRRIG TUBING LAPAROSCOPIC (IRRIGATION / IRRIGATOR) ×2 IMPLANT
SLEEVE XCEL OPT CAN 5 100 (ENDOMECHANICALS) ×4 IMPLANT
SPONGE GAUZE 4X4 12PLY (GAUZE/BANDAGES/DRESSINGS) ×2 IMPLANT
SPONGE LAP 18X18 X RAY DECT (DISPOSABLE) ×2 IMPLANT
STAPLER 90 3.5 STAND SLIM (STAPLE) ×2
STAPLER 90 3.5 STD SLIM (STAPLE) ×1 IMPLANT
STAPLER VISISTAT 35W (STAPLE) ×2 IMPLANT
SUCTION POOLE TIP (SUCTIONS) ×2 IMPLANT
SUT ETHILON 2 0 PS N (SUTURE) IMPLANT
SUT PDS AB 1 CTX 36 (SUTURE) ×4 IMPLANT
SUT PDS AB 1 TP1 96 (SUTURE) IMPLANT
SUT PDS AB 2-0 CT2 27 (SUTURE) IMPLANT
SUT PDS AB 4-0 PS2 18 (SUTURE) IMPLANT
SUT PROLENE 0 CT 2 (SUTURE) IMPLANT
SUT PROLENE 2 0 CT2 30 (SUTURE) IMPLANT
SUT PROLENE 2 0 KS (SUTURE) IMPLANT
SUT SILK 2 0 (SUTURE) ×1
SUT SILK 2 0 SH CR/8 (SUTURE) ×2 IMPLANT
SUT SILK 2-0 18XBRD TIE 12 (SUTURE) ×1 IMPLANT
SUT SILK 3 0 (SUTURE) ×1
SUT SILK 3 0 SH CR/8 (SUTURE) ×2 IMPLANT
SUT SILK 3-0 18XBRD TIE 12 (SUTURE) ×1 IMPLANT
SUT VIC AB 2-0 SH 18 (SUTURE) IMPLANT
SUT VICRYL 2 0 18  UND BR (SUTURE)
SUT VICRYL 2 0 18 UND BR (SUTURE) IMPLANT
SYS LAPSCP GELPORT 120MM (MISCELLANEOUS) ×2
SYSTEM LAPSCP GELPORT 120MM (MISCELLANEOUS) ×1 IMPLANT
TAPE UMBILICAL COTTON 1/8X30 (MISCELLANEOUS) ×2 IMPLANT
TOWEL OR 17X26 10 PK STRL BLUE (TOWEL DISPOSABLE) ×4 IMPLANT
TOWEL OR NON WOVEN STRL DISP B (DISPOSABLE) ×4 IMPLANT
TRAY FOLEY CATH 14FRSI W/METER (CATHETERS) ×2 IMPLANT
TRAY LAP CHOLE (CUSTOM PROCEDURE TRAY) ×2 IMPLANT
TROCAR BLADELESS OPT 5 100 (ENDOMECHANICALS) IMPLANT
TROCAR XCEL NON-BLD 11X100MML (ENDOMECHANICALS) IMPLANT
TROCAR XCEL NON-BLD 5MMX100MML (ENDOMECHANICALS) ×2 IMPLANT
TUBING CONNECTING 10 (TUBING) IMPLANT
TUBING FILTER THERMOFLATOR (ELECTROSURGICAL) ×2 IMPLANT
TUNNELER SHEATH ON-Q 16GX12 DP (PAIN MANAGEMENT) ×2 IMPLANT
YANKAUER SUCT BULB TIP 10FT TU (MISCELLANEOUS) ×4 IMPLANT
YANKAUER SUCT BULB TIP NO VENT (SUCTIONS) IMPLANT

## 2012-09-24 NOTE — Op Note (Signed)
09/24/2012  10:02 AM  PATIENT:  Dakota Vang  47 y.o. male  Patient Care Team: Pecola Lawless, MD as PCP - General Meryl Dare, MD as Consulting Physician (Gastroenterology) Cleotis Nipper, MD as Consulting Physician (Psychiatry)  PRE-OPERATIVE DIAGNOSIS:  Jejunal diverticulum with perforation and phlegmon  POST-OPERATIVE DIAGNOSIS:  jejunal diverticuli with inflammation  PROCEDURE:  Procedure(s): laparoscopic assisted  resection of proximal jejunum containing the jejunal diverticulum  SURGEON:  Surgeon(s): Ardeth Sportsman, MD Clovis Pu. Cornett, MD - Asst  ANESTHESIA:   local and general  EBL:  Total I/O In: 2000 [I.V.:2000] Out: 25 [Blood:25]  Delay start of Pharmacological VTE agent (>24hrs) due to surgical blood loss or risk of bleeding:  no  DRAINS: none   SPECIMEN:  Source of Specimen:  Proximal jejunum - stitch marks distal end  DISPOSITION OF SPECIMEN:  PATHOLOGY  COUNTS:  YES  PLAN OF CARE: Admit to inpatient   PATIENT DISPOSITION:  PACU - hemodynamically stable.  INDICATION:    Male with proximal jejunal diverticuli.  Also some duodenal and colonic diverticuli.  Developed severe pain in the abdomen was found to have perforation and phlegmon of jejunal diverticulum.  After two hospital admissions and prolonged antibiotics, it is improved.  I recommended surgical resection.  He was initially very hesitant.  I discussed with his primary care physician, his gastroenterologist, in numerous partners.  I did this per the patient's request.  I updated him.  Wife is very injected proceed with surgery.  Patient initially very anxious about this.  However does agree and understands the risks of non-operative management with recurrent or correction and life-threatening problems.  He agrees to proceed:  The anatomy & physiology of the digestive tract was discussed.  The pathophysiology was discussed.  Natural history risks without surgery was discussed.   I worked to  give an overview of the disease and the frequent need to have multispecialty involvement.  I feel the risks of no intervention will lead to serious problems that outweigh the operative risks; therefore, I recommended a partial small intestine resection to remove the pathology.  Laparoscopic & open techniques were discussed.   Risks such as bleeding, infection, abscess, leak, reoperation, possible ostomy, hernia, heart attack, death, and other risks were discussed.  I noted a good likelihood this will help address the problem.   Goals of post-operative recovery were discussed as well.  We will work to minimize complications.  An educational handout on the pathology was given as well.  Questions were answered.    The patient expresses understanding & wishes to proceed with surgery.  OR FINDINGS:   Patient had Proximal jejunal diverticuli.  He had a phlegmon about the size of a racquetball in the proximal jejunal mesentery.  Most of the distal small intestine normal without any diverticuli.  It is a proximal jejunojenostomy anastomosis that rests just distal to the ligament of Trietz.  DESCRIPTION:   Informed consent was confirmed.  The patient underwent general anaesthesia without difficulty.  The patient was positioned appropriately.  VTE prevention in place.  The patient's abdomen was clipped, prepped, & draped in a sterile fashion.  Surgical timeout confirmed our plan.  The patient was positioned in reverse Trendelenburg.  Abdominal entry was gained using optical entry technique in the right upper abdomen.  Entry was clean.  I induced carbon dioxide insufflation.  Camera inspection revealed no injury.  Extra ports were carefully placed under direct laparoscopic visualization.  I mobilized & reflected  the greater omentum.  I located the ileocecal junction and ran the small bowel proximally from the ileum.  I encountered inflammatory mass in the left upper quadrant as anticipated by prior CT scans.   Seem to be in the proximal jejunum.  No other tumors noted.  No pus or contamination.  No evidence of bowel obstruction.  I placed a GelPort has a wound protector through a supraumbilical midline 6cm incision in the supraumbilical region.  With that, I was able to eviserate the small intestine.  Patient had an inflamed mass in the proximal jejunal mesentery about the size of a racquetball.  There are a few small bowel adhesions of serosa.  I carefully freed those of but left the main inflammatory fold intact.  Patient also had diverticuli more proximally towards the ligament of Treitz and one more distally.  I transected the small intestine to include the jejunal mesenteric phlegmon and all obvious diverticuli, a region of 40 cm.  We took the intervening mesentery using clamps and ties as well as bipolar energy.  We assured hemostasis.   I did a side-to-side stapled anastomosis of proximal jejunum to more distal jejunum using a 75mm GIA stapler.  There was a smaller diverticulum in the very proximal jejunum.  We used a TA 90 stapler to staple off the common defect and transect this most proximal jejunal diverticulum.  Therefore, no more jejunal diverticuli remained.  We then closed off the common mesenteric defect using interrupted silk stitches to avoid any internal hernias.     We did laparoscopic & open reinspection of the abdomen.  Hemostasis was good.  The anastomosis looked healthy.  We did a final irrigation of antibiotic solution (900 mg clindamycin/240 mg gentamicin in a liter of crystalloid) & held that for 10 minutes while we removed the wound protector of the Gelport & changed gown & gloves.     We aspirated the antibiotic irrigation.  We reinspected the abdomen.  Hemostasis was good.   No injury.  The anastomosis looked healthy. I placed On-Q catheter and sheaths into the preperitoneal space under direct palpation.  I closed the 5mm port sites using Monocryl stitch and sterile dressing.  Closed  the midline incision using #1 PDS running closure. I closed the skin with some interrupted Monocryl stitches. I placed betadine-soaked wicks in between those areas. I placed sterile dressing.  OnQ catheters placed & sheaths peeled away.  Patient is being extubated go to recovery room. I discussed postop care with the patient in detail the office & in the holding area. Instructions are written.  I discussed the patient's status to his wife.  Postoperative goals and plans were discussed.  Questions were answered.  She expressed understanding & appreciation.

## 2012-09-24 NOTE — Transfer of Care (Signed)
Immediate Anesthesia Transfer of Care Note  Patient: Dakota Vang  Procedure(s) Performed: Procedure(s) (LRB): laparoscopic assisted  resection of proximal jejunum containing the jejunal diverticulum (N/A)  Patient Location: PACU  Anesthesia Type: General  Level of Consciousness: sedated, patient cooperative and responds to stimulaton  Airway & Oxygen Therapy: Patient Spontanous Breathing and Patient connected to face mask oxgen  Post-op Assessment: Report given to PACU RN and Post -op Vital signs reviewed and stable  Post vital signs: Reviewed and stable  Complications: No apparent anesthesia complications

## 2012-09-24 NOTE — Interval H&P Note (Signed)
History and Physical Interval Note:  09/24/2012 7:17 AM  Dakota Vang  has presented today for surgery, with the diagnosis of Jejunal diverticulum with perforation and phlegmon  The various methods of treatment have been discussed with the patient and family. After consideration of risks, benefits and other options for treatment, the patient has consented to  Procedure(s): laparoscopic possible open resection of small intestine containing the jejunal diverticulum (N/A) as a surgical intervention .  The patient's history has been reviewed, patient examined, no change in status, stable for surgery.  I have reviewed the patient's chart and labs.  Questions were answered to the patient's satisfaction.     Emanuelle Hammerstrom C.

## 2012-09-24 NOTE — Progress Notes (Signed)
Dr. Renold Don, anesthes. Notified of pt's elevated BP; will observe and continue to medicate for pain

## 2012-09-24 NOTE — H&P (View-Only) (Signed)
Subjective:     Patient ID: Dakota Vang, male   DOB: 1965-04-22, 47 y.o.   MRN: [id number]  HPI  Dakota Vang  03/11/1966 [id number]  Patient Care Team: Pecola Lawless, MD as PCP - General Meryl Dare, MD as Consulting Physician (Gastroenterology) Cleotis Nipper, MD as Consulting Physician (Psychiatry)  This patient is a 47 y.o.male who presents today for surgical evaluation   Reason for visit: Followup on jejunal diverticulitis  The pateint comes today with his wife.  He is feeling much better after his three day 2nd admission for contained jejunal perforation presumed to be be due to jejunal diverticulitis.  Discharged home 6/22.  He completed Cipro/Flagyl 2 days ago & feels fine.  Eating well.  Sticking to mainly a bland diet.  Activity & energy level good.  He is hoping to get up to [facility] next week & wonders if that is OK.  He wonders if he really needs surgery.    Patient Active Problem List   Diagnosis Date Noted  . Diverticulitis of jejunum with microperforation 08/28/2012  . Duodenal diverticulum 08/28/2012  . Leukocytosis 08/27/2012  . Constipation 08/27/2012  . DIVERTICULITIS, HX OF 06/24/2008  . ADD 11/20/2007  . COLONIC POLYPS, HX OF 11/20/2007  . HYPERLIPIDEMIA 06/12/2007  . HYPERTENSION 06/12/2007  . HEMORRHOIDS, INTERNAL 11/27/2006  . G E R D 09/19/2006    Past Medical History  Diagnosis Date  . GERD (gastroesophageal reflux disease)   . Colon polyps 2008    Dr Russella Dar  . Diverticulosis 2002    Dr Corinda Gubler  . Hyperlipidemia   . Diverticulitis     PMH of  . ADD (attention deficit disorder with hyperactivity)     Dr. Minta Balsam  . Anxiety     Dr. Minta Balsam  . Hypertension     Past Surgical History  Procedure Laterality Date  . Tonsillectomy    . Lasik      bilaterally  . Colonoscopy w/ polypectomy  2002 & 2008    Dr. Russella Dar; polyps X 2  . Colonoscopy  2013    Dr Russella Dar    History   Social History  . Marital Status: Married     Spouse Name: N/A    Number of Children: N/A  . Years of Education: N/A   Occupational History  . Not on file.   Social History Main Topics  . Smoking status: Current Some Day Smoker    Types: Cigarettes  . Smokeless tobacco: Never Used     Comment: 1 cigar / month; previously up to 1/2 ppd for 5 years  . Alcohol Use: 3.6 - 4.8 oz/week    6-8 Cans of beer per week     Comment: Beer on weekend   . Drug Use: No  . Sexually Active: Not on file   Other Topics Concern  . Not on file   Social History Narrative  . No narrative on file    Family History  Problem Relation Age of Onset  . Colon cancer Father     in 57s  . Lung cancer Father     smoker; asbestos exposure  . Cancer Father     Mesothelioma  . Coronary artery disease Mother     CAGB X 2 & angio 3-4X  . Cancer Mother     Gallbladder  . Coronary artery disease Maternal Grandmother   . COPD Maternal Grandmother   . Heart attack Maternal Grandfather  late 26's  . Heart attack Paternal Grandfather 4  . Diabetes Neg Hx   . Stroke Neg Hx     Current Outpatient Prescriptions  Medication Sig Dispense Refill  . amLODipine (NORVASC) 5 MG tablet Take 1 tablet (5 mg total) by mouth daily.  90 tablet  1  . buPROPion (WELLBUTRIN XL) 300 MG 24 hr tablet Take 300 mg by mouth daily.      . carvedilol (COREG) 6.25 MG tablet Take 1 tablet (6.25 mg total) by mouth 2 (two) times daily with a meal. 1 by mouth twice daily  180 tablet  1  . fluticasone (FLONASE) 50 MCG/ACT nasal spray Place 2 sprays into the nose daily. 1-2 sprays each nostril daily *seasonal*  48 g  1  . lamoTRIgine (LAMICTAL) 150 MG tablet Take 150 mg by mouth 2 (two) times daily.      . methylphenidate (RITALIN) 20 MG tablet 1 in am & at noon  60 tablet  0  . omeprazole (PRILOSEC) 20 MG capsule Take 20 mg by mouth daily.      . Probiotic Product (PROBIOTIC DAILY PO) Take 1 tablet by mouth daily.      . ramipril (ALTACE) 10 MG capsule Take 1 capsule (10 mg  total) by mouth daily.  90 capsule  1  . rosuvastatin (CRESTOR) 20 MG tablet Take 20 mg by mouth daily. Take on Sunday Monday Wednesday and Friday.      Marland Kitchen aspirin 325 MG tablet Take 325 mg by mouth daily.      . ciprofloxacin (CIPRO) 500 MG tablet Take 1 tablet (500 mg total) by mouth 2 (two) times daily.  8 tablet  0  . metroNIDAZOLE (FLAGYL) 500 MG tablet Take 1 tablet (500 mg total) by mouth 3 (three) times daily.  12 tablet  0  . Multiple Vitamins-Minerals (MULTIVITAMIN WITH MINERALS) tablet Take 1 tablet by mouth daily.      . promethazine (PHENERGAN) 12.5 MG tablet Take 1 tablet (12.5 mg total) by mouth every 6 (six) hours as needed for nausea.  30 tablet  0   No current facility-administered medications for this visit.     Allergies  Allergen Reactions  . Penicillins Other (See Comments)    ? Reaction @ 47 yrs old    BP 124/88  Pulse 84  Temp(Src) 98.2 F (36.8 C)  Resp 14  Ht 6\' 1"  (1.854 m)  Wt 216 lb 9.6 oz (98.249 kg)  BMI 28.58 kg/m2  [facility] 08/08/12 revealed: "slightly dilated few proximal jejunal bowel segments with adjacent inflammation in the left abdomen. There is suggestion of microperforation @ this level.There is questionable tiny calcific density within the small bowel segment proximal to inflammation.Constellation of findings suspicious for proximal jejunal inflammation secondary to small bowel diverticulitis vs foreign body causing perforation w/o abscess.".    Ct Abdomen Pelvis Wo Contrast  08/27/2012   *RADIOLOGY REPORT*  Clinical Data: Epigastric pain, nausea.  Recent diverticulitis.  CT ABDOMEN AND PELVIS WITHOUT CONTRAST  Technique:  Multidetector CT imaging of the abdomen and pelvis was performed following the standard protocol without intravenous contrast.  Comparison: Plain films earlier today.  CT 10/01/2003.  Findings: Lung bases are clear.  No effusions.  Heart is normal size.  Liver, gallbladder, spleen, pancreas, adrenals and  kidneys have an unremarkable unenhanced appearance.  Marked inflammatory process noted within the jejunal small bowel mesentery with associated wall thickening in multiple loops of jejunum.  There is a large  jejunal diverticulum noted on image 40 of series 2.  On the same image, there appears to be a small locule of extraluminal gas within the inflamed mesentery.  Findings most likely represent a jejunal diverticulitis.  There are a few scattered small reactive mesenteric lymph nodes in this area.  Descending colonic and sigmoid diverticulosis.  Wall thickening in the sigmoid colon compatible with diverticulosis.  No evidence of colonic diverticulitis.  Small amount of free fluid in the pelvis.  Appendix is visualized and is normal.  4.3 cm duodenal diverticulum adjacent to the second and third portions of the duodenum.  Stomach is grossly unremarkable.  IMPRESSION: Marked inflammatory process involving several jejunal small bowel loops and the jejunal mesentery.  There appears to be a large jejunal diverticulum in this area and findings most likely reflect a jejunal diverticulitis.  I believe I see at least one small locule of extraluminal gas suggesting micro perforation.  Adjacent reactive mesenteric lymph nodes.  These results were called to Dr. Jeraldine Loots at the time of interpretation.   Original Report Authenticated By: Charlett Nose, M.D.   Abd 1 View (kub)  08/28/2012   *RADIOLOGY REPORT*  Clinical Data: Abdominal pain, recent diverticulitis and inflammatory process of bowel.  ABDOMEN - 1 VIEW  Comparison: CT of the abdomen and pelvis on 08/27/2012.  Findings: Contrast administered for CT yesterday shows transit into the colon.  There is no evidence of bowel obstruction or significant ileus.  No abnormal calcifications are identified.  IMPRESSION: No evidence of bowel obstruction or ileus.   Original Report Authenticated By: Irish Lack, M.D.   Ct Abdomen Pelvis W Contrast  09/07/2012   *RADIOLOGY  REPORT*  Clinical Data: Evaluate diverticulitis.  The patient is currently on antibiotics.  CT ABDOMEN AND PELVIS WITH CONTRAST  Technique:  Multidetector CT imaging of the abdomen and pelvis was performed following the standard protocol during bolus administration of intravenous contrast.  Contrast: OMNIPAQUE IOHEXOL 300 MG/ML  SOLN  Comparison: CT of the abdomen and pelvis 08/27/2012.  Findings:  Lung Bases: Unremarkable.  Abdomen/Pelvis:  The appearance of the liver, gallbladder, pancreas, spleen, bilateral adrenal glands and bilateral kidneys is unremarkable.  A large duodenal diverticulum from the third portion of the duodenum is again noted measuring approximately 4.4 x 3.7 cm.  Compared to the prior examination, the inflammatory process in the small bowel mesentery associated with the jejunum has significantly improved, although there continues to be a haziness throughout the small bowel mesentery in this region, and a mild thickening of several small bowel loops in this region, likely related to persistent reactive inflammation.  Multiple small diverticulae from the jejunum are again noted.  No well-defined rim enhancing fluid collection is noted in this region to suggest a diverticular abscess at this time.  Normal appendix.  There are numerous colonic diverticula, most pronounced in the descending colon and sigmoid colon, without surrounding inflammatory changes to suggest acute diverticulitis at this time.  No significant volume of ascites.  No pneumoperitoneum.  No pathologic distension of small bowel.  No definite pathologic lymphadenopathy identified within the abdomen or pelvis.  Prostate gland and urinary bladder are unremarkable in appearance.  Small bilateral inguinal hernias containing only fat incidentally noted (unchanged).  Musculoskeletal: There are no aggressive appearing lytic or blastic lesions noted in the visualized portions of the skeleton.  IMPRESSION: 1.  There are numerous small  bowel diverticula are redemonstrated, as above.  Compared to the prior study, the inflammatory changes in the region  of the jejunal mesentery have significantly improved, but have not completely resolved.  Findings are compatible with a positive response to antibiotic therapy for underlying small bowel diverticulitis.  No diverticular abscess or other complicating features are identified on today's examination. 2.  In addition, there is extensive colonic diverticulosis without evidence of acute diverticulitis at this time. 3.  Normal appendix.   Original Report Authenticated By: Trudie Reed, M.D.   Dg Abd Acute W/chest  08/27/2012   *RADIOLOGY REPORT*  Clinical Data: Rule out free air  ACUTE ABDOMEN SERIES (ABDOMEN 2 VIEW & CHEST 1 VIEW)  Comparison: 04/24/2010  Findings: Normal heart size.  There is no pleural effusion or edema.  No airspace consolidation identified.  Moderate stool burden identified within the colon.  No dilated loops of small bowel or air-fluid levels identified.  No free intraperitoneal air is identified.  No abnormal abdominal or pelvic calcifications.  IMPRESSION:  1.  No acute abnormality noted. 2.  Moderate stool burden within the colon.   Original Report Authenticated By: Signa Kell, M.D.     Review of Systems  Constitutional: Negative for fever, chills and diaphoresis.  HENT: Negative for nosebleeds, sore throat, facial swelling, mouth sores, trouble swallowing and ear discharge.   Eyes: Negative for photophobia, discharge and visual disturbance.  Respiratory: Negative for choking, chest tightness, shortness of breath and stridor.   Cardiovascular: Negative for chest pain and palpitations.  Gastrointestinal: Negative for nausea, vomiting, abdominal pain, diarrhea, constipation, blood in stool, abdominal distention, anal bleeding and rectal pain.  Endocrine: Negative for cold intolerance and heat intolerance.  Genitourinary: Negative for dysuria, urgency, difficulty  urinating and testicular pain.  Musculoskeletal: Negative for myalgias, back pain, arthralgias and gait problem.  Skin: Negative for color change, pallor, rash and wound.  Allergic/Immunologic: Negative for environmental allergies and food allergies.  Neurological: Negative for dizziness, speech difficulty, weakness, numbness and headaches.  Hematological: Negative for adenopathy. Does not bruise/bleed easily.  Psychiatric/Behavioral: Negative for hallucinations, confusion and agitation.       Objective:   Physical Exam  Constitutional: He is oriented to person, place, and time. He appears well-developed and well-nourished. No distress.  HENT:  Head: Normocephalic.  Mouth/Throat: Oropharynx is clear and moist. No oropharyngeal exudate.  Eyes: Conjunctivae and EOM are normal. Pupils are equal, round, and reactive to light. No scleral icterus.  Neck: Normal range of motion. Neck supple. No tracheal deviation present.  Cardiovascular: Normal rate, regular rhythm and intact distal pulses.   Pulmonary/Chest: Effort normal and breath sounds normal. No respiratory distress.  Abdominal: Soft. He exhibits no distension. There is no tenderness. Hernia confirmed negative in the right inguinal area and confirmed negative in the left inguinal area.  Musculoskeletal: Normal range of motion. He exhibits no tenderness.  Lymphadenopathy:    He has no cervical adenopathy.       Right: No inguinal adenopathy present.       Left: No inguinal adenopathy present.  Neurological: He is alert and oriented to person, place, and time. No cranial nerve deficit. He exhibits normal muscle tone. Coordination normal.  Skin: Skin is warm and dry. No rash noted. He is not diaphoretic. No erythema. No pallor.  Psychiatric: He has a normal mood and affect. His behavior is normal. Judgment and thought content normal.       Assessment:     Recurrent/persistent jejunal diverticulitis     Plan:     At this point, I  again discussed surgery to remove the section  of the jejunum that contains the diverticulum.  The purpose of the antibiotics is to cool infection down to decrease the need of a larger SB resection, leak, fistula, abscess, wound infection, hernia, etc.  I am concerned that his risk of recurrent infection/perforation is high & it may not be managed by IV/PO ABX alone like these last 2 times.  The next perforation could be fatal or at the very least morbid in an emergent setting.  The best solution is to remove the risk by surgical resection.  I renewed his Cipro/Flagyl Rx and recommended that he start it back up if he feels any Sx & call me if not better/worse.    While he has has sigmoid diverticulitis in the past, he has not had any flares in several years.  It sounds more like constipation in that "they give something to drink in the ER that cleans me out & I am much better."  I recommended a low fat high fiber diet to help him out in avoiding problems with constipation.  He also has a duodenal diverticulum at D3/D4 but it is not causing problems, so I do not think that resection is warranted.    He wanted me to talk to his gastroenterologist, PCP, and other surgeons.  I tried to reassure him that I had d/w Dr Alwyn Ren while he was an inpatient, discussed with many of my partners before & again today (Drs Norman Clay, etc).  I noted that these doctors will get my note & I will try to reach them again.  Eventually, he seemed to understand and claimed that he trusted my judgment to recommend surgery.  I am hopeful that I can do this laparoscopically to provide a smaller incision & faster recovery,    The anatomy & physiology of the digestive tract was discussed.  The pathophysiology was discussed.  Natural history risks without surgery was discussed.   I feel the risks of no intervention will lead to serious problems that outweigh the operative risks; therefore, I recommended a partial resection of small  intestine to remove the pathology.  Laparoscopic & open techniques were discussed.   Risks such as bleeding, infection, abscess, leak, reoperation, possible ostomy, hernia, heart attack, death, and other risks were discussed.  I noted that these risks were much higher in emergency setting than electively.  I noted a good likelihood this will help address the problem.   Goals of post-operative recovery were discussed as well.  We will work to minimize complications.  An educational handout on the pathology was given as well.  Recent case report & literature review given to him as well which outlines recommendations for segmental resection in cases of complicated (diverticulitis/perforation) but not incidental jejunal diverticulitis.  Questions were answered.  The patient expresses understanding & wishes to proceed with surgery.      Addendum: The case and information have been reviewed by both the patient's primary care physician, Dr. Alwyn Ren, and his gastroenterologist, Dr. Russella Dar.  Both are in agreement of surgical resection of the jejunum containing the diverticulitis.

## 2012-09-24 NOTE — Progress Notes (Signed)
Dr. Renold Don in to check pt, BP better, pt dozing

## 2012-09-24 NOTE — Anesthesia Postprocedure Evaluation (Signed)
Anesthesia Post Note  Patient: Dakota Vang  Procedure(s) Performed: Procedure(s) (LRB): laparoscopic assisted  resection of proximal jejunum containing the jejunal diverticulum (N/A)  Anesthesia type: General  Patient location: PACU  Post pain: Pain level controlled  Post assessment: Post-op Vital signs reviewed  Last Vitals: BP 149/89  Pulse 91  Temp(Src) 36.3 C (Oral)  Resp 14  SpO2 99%  Post vital signs: Reviewed  Level of consciousness: sedated  Complications: No apparent anesthesia complications

## 2012-09-24 NOTE — Anesthesia Preprocedure Evaluation (Addendum)
Anesthesia Evaluation  Patient identified by MRN, date of birth, ID band Patient awake    Reviewed: Allergy & Precautions, H&P , NPO status , Patient's Chart, lab work & pertinent test results  Airway Mallampati: II TM Distance: >3 FB Neck ROM: Full    Dental  (+) Dental Advisory Given and Teeth Intact   Pulmonary Current Smoker,  breath sounds clear to auscultation- rhonchi        Cardiovascular hypertension, Pt. on medications Rhythm:Regular Rate:Normal     Neuro/Psych PSYCHIATRIC DISORDERS Anxiety negative neurological ROS     GI/Hepatic Neg liver ROS, GERD-  Medicated,  Endo/Other  negative endocrine ROS  Renal/GU negative Renal ROS     Musculoskeletal negative musculoskeletal ROS (+)   Abdominal   Peds  Hematology negative hematology ROS (+)   Anesthesia Other Findings   Reproductive/Obstetrics                          Anesthesia Physical Anesthesia Plan  ASA: II  Anesthesia Plan: General   Post-op Pain Management:    Induction: Intravenous  Airway Management Planned: Oral ETT  Additional Equipment:   Intra-op Plan:   Post-operative Plan: Extubation in OR  Informed Consent: I have reviewed the patients History and Physical, chart, labs and discussed the procedure including the risks, benefits and alternatives for the proposed anesthesia with the patient or authorized representative who has indicated his/her understanding and acceptance.   Dental advisory given  Plan Discussed with: CRNA  Anesthesia Plan Comments:         Anesthesia Quick Evaluation

## 2012-09-24 NOTE — Progress Notes (Signed)
CARE MANAGEMENT NOTE 09/24/2012  Patient:  CHE, RACHAL A   Account Number:  [id number]  Date Initiated:  09/24/2012  Documentation initiated by:  DAVIS,RHONDA  Subjective/Objective Assessment:   pt readmitted and taken to or for microperforation at surgical anastomosis from June 2014,     Action/Plan:   from home   Anticipated DC Date:  09/27/2012   Anticipated DC Plan:  HOME/SELF CARE         Choice offered to / List presented to:             Status of service:  In process, will continue to follow Medicare Important Message given?   (If response is "NO", the following Medicare IM given date fields will be blank) Date Medicare IM given:   Date Additional Medicare IM given:    Discharge Disposition:    Per UR Regulation:  Reviewed for med. necessity/level of care/duration of stay  If discussed at Long Length of Stay Meetings, dates discussed:    Comments:  [id number] Earlene Plater, RN, BSN, CCM:  CHART REVIEWED AND UPDATED.  Next chart review due on [id number]. NO DISCHARGE NEEDS PRESENT AT THIS TIME. CASE MANAGEMENT [phone]

## 2012-09-25 ENCOUNTER — Encounter (HOSPITAL_COMMUNITY): Payer: Self-pay | Admitting: Surgery

## 2012-09-25 LAB — BASIC METABOLIC PANEL
BUN: 9 mg/dL (ref 6–23)
Calcium: 9.3 mg/dL (ref 8.4–10.5)
Creatinine, Ser: 0.98 mg/dL (ref 0.50–1.35)
GFR calc Af Amer: >90 mL/min (ref >90)
GFR calc non Af Amer: >90 mL/min (ref >90)

## 2012-09-25 LAB — CBC
HCT: 37.9 % — ABNORMAL LOW (ref 39.0–52.0)
MCHC: 33.8 g/dL (ref 30.0–36.0)
Platelets: 173 10*3/uL (ref 150–400)
RDW: 13.4 % (ref 11.5–15.5)
WBC: 11.6 10*3/uL — ABNORMAL HIGH (ref 4.0–10.5)

## 2012-09-25 MED ORDER — HYDROMORPHONE HCL PF 2 MG/ML IJ SOLN
INTRAMUSCULAR | Status: AC
  Filled 2012-09-25: qty 1

## 2012-09-25 MED ORDER — HYDROMORPHONE HCL PF 1 MG/ML IJ SOLN
INTRAMUSCULAR | Status: AC
  Administered 2012-09-25: 1 mg
  Filled 2012-09-25: qty 1

## 2012-09-25 MED ORDER — LORAZEPAM 2 MG/ML IJ SOLN
INTRAMUSCULAR | Status: AC
  Filled 2012-09-25: qty 1

## 2012-09-25 MED ORDER — HYDROMORPHONE HCL PF 2 MG/ML IJ SOLN
2.0000 mg | INTRAMUSCULAR | Status: DC | PRN
  Administered 2012-09-25 (×2): 2 mg via INTRAVENOUS
  Administered 2012-09-25: 3 mg via INTRAVENOUS
  Administered 2012-09-25: 1 mg via INTRAVENOUS
  Administered 2012-09-25: 4 mg via INTRAVENOUS
  Administered 2012-09-26 (×4): 3 mg via INTRAVENOUS
  Administered 2012-09-26: 2 mg via INTRAVENOUS
  Administered 2012-09-26 – 2012-09-27 (×5): 3 mg via INTRAVENOUS
  Administered 2012-09-27: 2 mg via INTRAVENOUS
  Administered 2012-09-27 (×4): 3 mg via INTRAVENOUS
  Administered 2012-09-27: 2 mg via INTRAVENOUS
  Administered 2012-09-27: 3 mg via INTRAVENOUS
  Administered 2012-09-28 (×2): 2 mg via INTRAVENOUS
  Filled 2012-09-25 (×4): qty 2
  Filled 2012-09-25 (×2): qty 1
  Filled 2012-09-25 (×2): qty 2
  Filled 2012-09-25: qty 1
  Filled 2012-09-25 (×2): qty 2
  Filled 2012-09-25: qty 1
  Filled 2012-09-25 (×3): qty 2
  Filled 2012-09-25: qty 1
  Filled 2012-09-25 (×2): qty 2
  Filled 2012-09-25: qty 1
  Filled 2012-09-25 (×4): qty 2

## 2012-09-25 MED ORDER — LORAZEPAM 2 MG/ML IJ SOLN
0.5000 mg | Freq: Three times a day (TID) | INTRAMUSCULAR | Status: DC | PRN
  Administered 2012-09-25: 0.5 mg via INTRAVENOUS

## 2012-09-25 NOTE — Progress Notes (Signed)
Dakota Vang [id number] Apr 17, 1965  CARE TEAM:  PCP: Marga Melnick, MD  Outpatient Care Team: Patient Care Team: Pecola Lawless, MD as PCP - General Meryl Dare, MD as Consulting Physician (Gastroenterology) Cleotis Nipper, MD as Consulting Physician (Psychiatry)  Inpatient Treatment Team: Treatment Team: Attending Provider: Ardeth Sportsman, MD; Consulting Physician: Bishop Limbo, MD; Registered Nurse: Bethann Goo, RN; Technician: Valetta Close, [place]   Subjective:  Pt sitting up in room, trying full liquids Wife in room Pt walked in hallways He is thankful for care  Objective:  Vital signs:  Filed Vitals:   09/24/12 2225 09/25/12 0124 09/25/12 0539 09/25/12 0543  BP: 119/73 127/80  120/78  Pulse: 87 95  88  Temp: 98.6 F (37 C) 98.3 F (36.8 C)  98.4 F (36.9 C)  TempSrc: Oral Oral  Oral  Resp: 20 18  18   Height:      Weight:   221 lb 0.5 oz (100.26 kg)   SpO2: 100% 95%  99%       Intake/Output   Yesterday:  07/17 0701 - 07/18 0700 In: 4236.7 [P.O.:600; I.V.:3186.7; IV Piggyback:450] Out: 1275 [Urine:1250; Blood:25] This shift:  Total I/O In: -  Out: 275 [Urine:275]  Bowel function:  Flatus: n  BM: n  Drain: n/a  Physical Exam:  General: Pt awake/alert/oriented x4 in no acute distress Eyes: PERRL, normal EOM.  Sclera clear.  No icterus Neuro: CN II-XII intact w/o focal sensory/motor deficits. Lymph: No head/neck/groin lymphadenopathy Psych:  No delerium/psychosis/paranoia HENT: Normocephalic, Mucus membranes moist.  No thrush Neck: Supple, No tracheal deviation Chest: No chest wall pain w good excursion CV:  Pulses intact.  Regular rhythm MS: Normal AROM mjr joints.  No obvious deformity Abdomen: Soft.  Nondistended.  Mildly tender at incisions only.  No evidence of peritonitis.  No incarcerated hernias. Ext:  SCDs BLE.  No mjr edema.  No cyanosis Skin: No petechiae / purpura   Problem List:   Principal Problem:  Diverticulitis of jejunum with microperforation Active Problems:   ADD   HYPERTENSION   G E R D   Assessment  Dakota Vang  47 y.o. male  1 Day Post-Op  Procedure(s): laparoscopic assisted  resection of proximal jejunum containing the jejunal diverticulum  POD#1 s/p SBR for jejunal diverticuli & perforation  Plan:  -keep on fulls/pureed today until flatus -when + flatus, adv diet  -f/u path -VTE prophylaxis- SCDs, etc -mobilize as tolerated to help recovery  I discussed the patient's status to the patient & his wife.  Questions were answered.  They expressed understanding & appreciation.   Ardeth Sportsman, M.D., F.A.C.S. Gastrointestinal and Minimally Invasive Surgery [facility] [address] [place], [place] [address] [phone] Main / Paging   09/25/2012   Results:   Labs: Results for orders placed during the hospital encounter of 09/24/12 (from the past 48 hour(s))  TYPE AND SCREEN     Status: None   Collection Time    09/24/12  5:50 AM      Result Value Range   ABO/RH(D) B NEG     Antibody Screen NEG     Sample Expiration 09/27/2012    ABO/RH     Status: None   Collection Time    09/24/12  6:00 AM      Result Value Range   ABO/RH(D) B NEG    CBC     Status: Abnormal   Collection Time  09/24/12 12:30 PM      Result Value Range   WBC 13.3 (*) 4.0 - 10.5 K/uL   RBC 4.87  4.22 - 5.81 MIL/uL   Hemoglobin 14.3  13.0 - 17.0 g/dL   HCT 13.0  86.5 - 78.4 %   MCV 84.4  78.0 - 100.0 fL   MCH 29.4  26.0 - 34.0 pg   MCHC 34.8  30.0 - 36.0 g/dL   RDW 69.6  29.5 - 28.4 %   Platelets 197  150 - 400 K/uL  CREATININE, SERUM     Status: None   Collection Time    09/24/12 12:30 PM      Result Value Range   Creatinine, Ser 0.94  0.50 - 1.35 mg/dL   GFR calc non Af Amer >90  >90 mL/min   GFR calc Af Amer >90  >90 mL/min   Comment:            The eGFR has been calculated     using the CKD EPI equation.     This  calculation has not been     validated in all clinical     situations.     eGFR's persistently     <90 mL/min signify     possible Chronic Kidney Disease.  BASIC METABOLIC PANEL     Status: Abnormal   Collection Time    09/25/12  4:05 AM      Result Value Range   Sodium 137  135 - 145 mEq/L   Potassium 4.0  3.5 - 5.1 mEq/L   Chloride 100  96 - 112 mEq/L   CO2 29  19 - 32 mEq/L   Glucose, Bld 162 (*) 70 - 99 mg/dL   BUN 9  6 - 23 mg/dL   Creatinine, Ser 1.32  0.50 - 1.35 mg/dL   Calcium 9.3  8.4 - 44.0 mg/dL   GFR calc non Af Amer >90  >90 mL/min   GFR calc Af Amer >90  >90 mL/min   Comment:            The eGFR has been calculated     using the CKD EPI equation.     This calculation has not been     validated in all clinical     situations.     eGFR's persistently     <90 mL/min signify     possible Chronic Kidney Disease.  CBC     Status: Abnormal   Collection Time    09/25/12  4:05 AM      Result Value Range   WBC 11.6 (*) 4.0 - 10.5 K/uL   RBC 4.37  4.22 - 5.81 MIL/uL   Hemoglobin 12.8 (*) 13.0 - 17.0 g/dL   HCT 10.2 (*) 72.5 - 36.6 %   MCV 86.7  78.0 - 100.0 fL   MCH 29.3  26.0 - 34.0 pg   MCHC 33.8  30.0 - 36.0 g/dL   RDW 44.0  34.7 - 42.5 %   Platelets 173  150 - 400 K/uL  MAGNESIUM     Status: None   Collection Time    09/25/12  4:05 AM      Result Value Range   Magnesium 1.9  1.5 - 2.5 mg/dL    Imaging / Studies: No results found.  Medications / Allergies: per chart  Antibiotics: Anti-infectives   Start     Dose/Rate Route Frequency Ordered Stop   09/24/12 1400  clindamycin (CLEOCIN) IVPB 900  mg     900 mg 100 mL/hr over 30 Minutes Intravenous Every 6 hours 09/24/12 1159 09/25/12 0148   09/24/12 1400  metroNIDAZOLE (FLAGYL) IVPB 500 mg     500 mg 100 mL/hr over 60 Minutes Intravenous Every 6 hours 09/24/12 1159 09/25/12 0257   09/24/12 0900  clindamycin (CLEOCIN) 900 mg, gentamicin (GARAMYCIN) 240 mg in sodium chloride 0.9 % 1,000 mL for  intraperitoneal lavage      Intraperitoneal To Surgery 09/24/12 0824 09/24/12 0833   09/24/12 0715  clindamycin (CLEOCIN) IVPB 900 mg  Status:  Discontinued    Comments:  Pharmacy may adjust dosing strength, interval, or rate of medication as needed for optimal therapy for the patient Send with patient on call to the OR.  Anesthesia to complete antibiotic administration <53min prior to incision per [facility].   900 mg 100 mL/hr over 30 Minutes Intravenous On call to O.R. 09/24/12 0701 09/24/12 1139

## 2012-09-26 NOTE — Progress Notes (Signed)
Dakota Vang [id number] 07-11-1965  CARE TEAM:  PCP: Marga Melnick, MD  Outpatient Care Team: Patient Care Team: Pecola Lawless, MD as PCP - General Meryl Dare, MD as Consulting Physician (Gastroenterology) Cleotis Nipper, MD as Consulting Physician (Psychiatry)  Inpatient Treatment Team: Treatment Team: Attending Provider: Ardeth Sportsman, MD; Consulting Physician: Bishop Limbo, MD; Registered Nurse: Bethann Goo, RN; Technician: Valetta Close, [place]; Technician: Harrie Jeans Debbink, NT   Subjective:  Pt sitting up in room, on clears- tolerating with some nausea but he feels nausea due to narcotics administration. Positive flatus this AM Wife in room Pt walked in hallways   Objective:  Vital signs:  Filed Vitals:   09/25/12 1400 09/25/12 1800 09/25/12 2106 09/26/12 0520  BP: 144/88 140/93 131/93 140/94  Pulse: 93 94 100 99  Temp: 98 F (36.7 C) 98.2 F (36.8 C) 97.9 F (36.6 C) 97.9 F (36.6 C)  TempSrc: Oral Oral Oral Oral  Resp: 18 18 20 18   Height:      Weight:    221 lb 12.8 oz (100.608 kg)  SpO2: 94% 98% 93% 96%    Last BM Date: 09/23/12  Intake/Output   Yesterday:  07/18 0701 - 07/19 0700 In: 653.3 [P.O.:240; I.V.:413.3] Out: 2675 [Urine:2675] This shift:     Bowel function:  Flatus: y  BM: n  Drain: n/a  Physical Exam:  General: Pt awake/alert/oriented x4 in no acute distress Eyes: PERRL, normal EOM.  Sclera clear.  No icterus Neuro: CN II-XII intact w/o focal sensory/motor deficits. Lymph: No head/neck/groin lymphadenopathy Psych:  No delerium/psychosis/paranoia HENT: Normocephalic, Mucus membranes moist.  No thrush Neck: Supple, No tracheal deviation Chest: No chest wall pain w good excursion CV:  Pulses intact.  Regular rhythm MS: Normal AROM mjr joints.  No obvious deformity Abdomen: Soft.  Nondistended.  Mildly tender at incisions only.  No evidence of peritonitis.  No incarcerated hernias. Ext:  SCDs BLE.  No mjr edema.  No  cyanosis Skin: No petechiae / purpura   Problem List:   Principal Problem:   Diverticulitis of jejunum with microperforation Active Problems:   ADD   HYPERTENSION   G E R D   Assessment  Dakota Vang  47 y.o. male  2 Days Post-Op  Procedure(s): laparoscopic assisted  resection of proximal jejunum containing the jejunal diverticulum  POD#2 s/p SBR for jejunal diverticuli & perforation  Plan:  -will restart fulls today, pt having some flatus  -f/u path -VTE prophylaxis- SCDs, etc -mobilize as tolerated to help recovery  I discussed the patient's status to the patient & his wife.  Questions were answered.  They expressed understanding & appreciation.   Ardeth Sportsman, M.D., F.A.C.S. Gastrointestinal and Minimally Invasive Surgery [facility] [address] [place], [place] [address] [phone] Main / Paging   09/26/2012   Results:   Labs: Results for orders placed during the hospital encounter of 09/24/12 (from the past 48 hour(s))  CBC     Status: Abnormal   Collection Time    09/24/12 12:30 PM      Result Value Range   WBC 13.3 (*) 4.0 - 10.5 K/uL   RBC 4.87  4.22 - 5.81 MIL/uL   Hemoglobin 14.3  13.0 - 17.0 g/dL   HCT 86.5  78.4 - 69.6 %   MCV 84.4  78.0 - 100.0 fL   MCH 29.4  26.0 - 34.0 pg   MCHC 34.8  30.0 -  36.0 g/dL   RDW 81.1  91.4 - 78.2 %   Platelets 197  150 - 400 K/uL  CREATININE, SERUM     Status: None   Collection Time    09/24/12 12:30 PM      Result Value Range   Creatinine, Ser 0.94  0.50 - 1.35 mg/dL   GFR calc non Af Amer >90  >90 mL/min   GFR calc Af Amer >90  >90 mL/min   Comment:            The eGFR has been calculated     using the CKD EPI equation.     This calculation has not been     validated in all clinical     situations.     eGFR's persistently     <90 mL/min signify     possible Chronic Kidney Disease.  BASIC METABOLIC PANEL     Status: Abnormal   Collection Time     09/25/12  4:05 AM      Result Value Range   Sodium 137  135 - 145 mEq/L   Potassium 4.0  3.5 - 5.1 mEq/L   Chloride 100  96 - 112 mEq/L   CO2 29  19 - 32 mEq/L   Glucose, Bld 162 (*) 70 - 99 mg/dL   BUN 9  6 - 23 mg/dL   Creatinine, Ser 9.56  0.50 - 1.35 mg/dL   Calcium 9.3  8.4 - 21.3 mg/dL   GFR calc non Af Amer >90  >90 mL/min   GFR calc Af Amer >90  >90 mL/min   Comment:            The eGFR has been calculated     using the CKD EPI equation.     This calculation has not been     validated in all clinical     situations.     eGFR's persistently     <90 mL/min signify     possible Chronic Kidney Disease.  CBC     Status: Abnormal   Collection Time    09/25/12  4:05 AM      Result Value Range   WBC 11.6 (*) 4.0 - 10.5 K/uL   RBC 4.37  4.22 - 5.81 MIL/uL   Hemoglobin 12.8 (*) 13.0 - 17.0 g/dL   HCT 08.6 (*) 57.8 - 46.9 %   MCV 86.7  78.0 - 100.0 fL   MCH 29.3  26.0 - 34.0 pg   MCHC 33.8  30.0 - 36.0 g/dL   RDW 62.9  52.8 - 41.3 %   Platelets 173  150 - 400 K/uL  MAGNESIUM     Status: None   Collection Time    09/25/12  4:05 AM      Result Value Range   Magnesium 1.9  1.5 - 2.5 mg/dL    Imaging / Studies: No results found.  Medications / Allergies: per chart  Antibiotics: Anti-infectives   Start     Dose/Rate Route Frequency Ordered Stop   09/24/12 1400  clindamycin (CLEOCIN) IVPB 900 mg     900 mg 100 mL/hr over 30 Minutes Intravenous Every 6 hours 09/24/12 1159 09/25/12 0148   09/24/12 1400  metroNIDAZOLE (FLAGYL) IVPB 500 mg     500 mg 100 mL/hr over 60 Minutes Intravenous Every 6 hours 09/24/12 1159 09/25/12 0257   09/24/12 0900  clindamycin (CLEOCIN) 900 mg, gentamicin (GARAMYCIN) 240 mg in sodium chloride 0.9 % 1,000 mL for intraperitoneal lavage  Intraperitoneal To Surgery 09/24/12 0824 09/24/12 0833   09/24/12 0715  clindamycin (CLEOCIN) IVPB 900 mg  Status:  Discontinued    Comments:  Pharmacy may adjust dosing strength, interval, or rate of  medication as needed for optimal therapy for the patient Send with patient on call to the OR.  Anesthesia to complete antibiotic administration <34min prior to incision per [facility].   900 mg 100 mL/hr over 30 Minutes Intravenous On call to O.R. 09/24/12 0701 09/24/12 1139

## 2012-09-27 MED ORDER — SODIUM CHLORIDE 0.9 % IJ SOLN: 3.0000 mL | INTRAMUSCULAR | Status: DC | PRN

## 2012-09-27 MED ORDER — OXYCODONE HCL 5 MG PO TABS
10.0000 mg | ORAL_TABLET | ORAL | Status: DC | PRN
  Administered 2012-09-27 – 2012-09-28 (×6): 15 mg via ORAL
  Filled 2012-09-27 (×6): qty 3

## 2012-09-27 MED ORDER — SODIUM CHLORIDE 0.9 % IJ SOLN
3.0000 mL | Freq: Two times a day (BID) | INTRAMUSCULAR | Status: DC
  Administered 2012-09-28 (×2): 3 mL via INTRAVENOUS

## 2012-09-27 MED ORDER — SODIUM CHLORIDE 0.9 % IV SOLN: 250.0000 mL | INTRAVENOUS | Status: DC | PRN

## 2012-09-27 MED ORDER — METOPROLOL TARTRATE 1 MG/ML IV SOLN
10.0000 mg | Freq: Four times a day (QID) | INTRAVENOUS | Status: DC | PRN
  Filled 2012-09-27: qty 10

## 2012-09-27 NOTE — Progress Notes (Signed)
Dakota Vang [id number] April 20, 1965  CARE TEAM:  PCP: Marga Melnick, MD  Outpatient Care Team: Patient Care Team: Pecola Lawless, MD as PCP - General Meryl Dare, MD as Consulting Physician (Gastroenterology) Cleotis Nipper, MD as Consulting Physician (Psychiatry)  Inpatient Treatment Team: Treatment Team: Attending Provider: Ardeth Sportsman, MD; Consulting Physician: Bishop Limbo, MD; Registered Nurse: Bethann Goo, RN; Technician: Valetta Close, [place]; Registered Nurse: Lattie Haw, RN; Technician: Harrie Jeans Debbink, NT   Subjective:  Pt sitting up in room, passing more flatus but no BM's-min nausea Wife in room Pt walked in hallways   Objective:  Vital signs:  Filed Vitals:   09/26/12 0520 09/26/12 1400 09/26/12 2217 09/27/12 0621  BP: 140/94 132/99 137/92 139/91  Pulse: 99 106 98 107  Temp: 97.9 F (36.6 C) 97.7 F (36.5 C) 98.4 F (36.9 C) 98.1 F (36.7 C)  TempSrc: Oral Oral Oral Oral  Resp: 18 18 18 18   Height:      Weight: 221 lb 12.8 oz (100.608 kg)     SpO2: 96% 95% 93% 95%    Last BM Date: 09/23/12  Intake/Output   Yesterday:  07/19 0701 - 07/20 0700 In: 1400 [I.V.:1400] Out: 3300 [Urine:3300] This shift:     Bowel function:  Flatus: y  BM: n  Drain: n/a  Physical Exam:  General: Pt awake/alert/oriented x4 in no acute distress Eyes: PERRL, normal EOM.  Sclera clear.  No icterus Neuro: CN II-XII intact w/o focal sensory/motor deficits. Lymph: No head/neck/groin lymphadenopathy Psych:  No delerium/psychosis/paranoia HENT: Normocephalic, Mucus membranes moist.  No thrush Neck: Supple, No tracheal deviation Chest: No chest wall pain w good excursion CV:  Pulses intact.  Regular rhythm MS: Normal AROM mjr joints.  No obvious deformity Abdomen: Soft.  Nondistended.  Mildly tender at incisions only.  No evidence of peritonitis.  No incarcerated hernias. Ext:  SCDs BLE.  No mjr edema.  No cyanosis Skin: No petechiae /  purpura   Problem List:   Principal Problem:   Diverticulitis of jejunum with microperforation Active Problems:   ADD   HYPERTENSION   G E R D   Assessment  Dakota Vang  47 y.o. male  3 Days Post-Op  Procedure(s): laparoscopic assisted  resection of proximal jejunum containing the jejunal diverticulum  POD#3 s/p SBR for jejunal diverticuli & perforation  Plan:  -will cont fulls today, await BM for reg diet  -f/u path  -SL IV  -d/c OnQ pump today and start PO narcotics -VTE prophylaxis- SCDs, etc -mobilize as tolerated to help recovery  I discussed the patient's status to the patient & his wife.  Questions were answered.  They expressed understanding & appreciation.   Vanita Panda, MD  Colorectal and General Surgery [facility]    09/27/2012   Results:   Labs: No results found for this or any previous visit (from the past 48 hour(s)).  Imaging / Studies: No results found.  Medications / Allergies: per chart  Antibiotics: Anti-infectives   Start     Dose/Rate Route Frequency Ordered Stop   09/24/12 1400  clindamycin (CLEOCIN) IVPB 900 mg     900 mg 100 mL/hr over 30 Minutes Intravenous Every 6 hours 09/24/12 1159 09/25/12 0148   09/24/12 1400  metroNIDAZOLE (FLAGYL) IVPB 500 mg     500 mg 100 mL/hr over 60 Minutes Intravenous Every 6 hours 09/24/12 1159 09/25/12 0257   09/24/12 0900  clindamycin (CLEOCIN) 900 mg,  gentamicin (GARAMYCIN) 240 mg in sodium chloride 0.9 % 1,000 mL for intraperitoneal lavage      Intraperitoneal To Surgery 09/24/12 0824 09/24/12 0833   09/24/12 0715  clindamycin (CLEOCIN) IVPB 900 mg  Status:  Discontinued    Comments:  Pharmacy may adjust dosing strength, interval, or rate of medication as needed for optimal therapy for the patient Send with patient on call to the OR.  Anesthesia to complete antibiotic administration <89min prior to incision per [facility].   900 mg 100 mL/hr over 30 Minutes  Intravenous On call to O.R. 09/24/12 0701 09/24/12 1139

## 2012-09-28 MED ORDER — METHOCARBAMOL 750 MG PO TABS
750.0000 mg | ORAL_TABLET | Freq: Three times a day (TID) | ORAL | Status: DC
  Administered 2012-09-28 – 2012-09-29 (×3): 750 mg via ORAL
  Filled 2012-09-28 (×6): qty 1

## 2012-09-28 MED ORDER — NAPROXEN 500 MG PO TABS
500.0000 mg | ORAL_TABLET | Freq: Two times a day (BID) | ORAL | Status: DC
  Administered 2012-09-28 – 2012-09-29 (×2): 500 mg via ORAL
  Filled 2012-09-28 (×4): qty 1

## 2012-09-28 MED ORDER — OXYCODONE HCL 5 MG PO TABS
10.0000 mg | ORAL_TABLET | ORAL | Status: DC | PRN
  Administered 2012-09-28 – 2012-09-29 (×7): 20 mg via ORAL
  Filled 2012-09-28 (×7): qty 4

## 2012-09-28 NOTE — Progress Notes (Signed)
Dakota Vang [id number] 18-Aug-1965  CARE TEAM:  PCP: Marga Melnick, MD  Outpatient Care Team: Patient Care Team: Pecola Lawless, MD as PCP - General Meryl Dare, MD as Consulting Physician (Gastroenterology) Cleotis Nipper, MD as Consulting Physician (Psychiatry)  Inpatient Treatment Team: Treatment Team: Attending Provider: Ardeth Sportsman, MD; Registered Nurse: Bethann Goo, RN; Technician: Valetta Close, [place]; Technician: Harrie Jeans Debbink, NT   Subjective:  Pt restless in room Passing LOTS OF flatus but no BM Wife in room Pt walked in hallways less yesterday Tol liquids but appetite fair No N/V   Objective:  Vital signs:  Filed Vitals:   09/27/12 0621 09/27/12 1400 09/27/12 2055 09/28/12 0521  BP: 139/91 149/88 126/90 130/88  Pulse: 107 88 95 83  Temp: 98.1 F (36.7 C) 98.2 F (36.8 C) 98.1 F (36.7 C) 97.7 F (36.5 C)  TempSrc: Oral Oral Oral Oral  Resp: 18 18 18 16   Height:      Weight:      SpO2: 95% 97% 97% 96%    Last BM Date: 09/23/12  Intake/Output   Yesterday:  07/20 0701 - 07/21 0700 In: 1280 [P.O.:480; I.V.:800] Out: 1900 [Urine:1900] This shift:     Bowel function:  Flatus: y  BM: n  Drain: n/a  Physical Exam:  General: Pt awake/alert/oriented x4 in no acute distress Eyes: PERRL, normal EOM.  Sclera clear.  No icterus Neuro: CN II-XII intact w/o focal sensory/motor deficits. Lymph: No head/neck/groin lymphadenopathy Psych:  No delerium/psychosis/paranoia.  Mildly anxious but consolable HENT: Normocephalic, Mucus membranes moist.  No thrush Neck: Supple, No tracheal deviation Chest: No chest wall pain w good excursion CV:  Pulses intact.  Regular rhythm MS: Normal AROM mjr joints.  No obvious deformity Abdomen: Soft.  Mildly distended.  Mildly tender at incisions only.  No evidence of peritonitis.  No incarcerated hernias. Ext:  SCDs BLE.  No mjr edema.  No cyanosis Skin: No petechiae / purpura   Problem List:    Principal Problem:   Diverticulitis of jejunum with microperforation Active Problems:   ADD   HYPERTENSION   G E R D   Assessment  Dwyane A Klugh  47 y.o. male  4 Days Post-Op  Procedure(s): laparoscopic assisted  resection of proximal jejunum containing the jejunal diverticulum  POD#4 s/p SBR for jejunal diverticuli & perforation  Plan:  -try solids PO -bowel regimen -follow off IVF -anxiolysis -ADD Tx  -VTE prophylaxis- SCDs, etc -mobilize as tolerated to help recovery  I discussed the patient's status to the patient & his wife.  Pathology benign - d/w them.  Questions were answered.  They expressed understanding & appreciation.   Ardeth Sportsman, M.D., F.A.C.S. Gastrointestinal and Minimally Invasive Surgery [facility] [address] [place], [place] [address] [phone] Main / Paging     09/28/2012   Results:   Labs: No results found for this or any previous visit (from the past 48 hour(s)).  Imaging / Studies: No results found.  Medications / Allergies: per chart  Antibiotics: Anti-infectives   Start     Dose/Rate Route Frequency Ordered Stop   09/24/12 1400  clindamycin (CLEOCIN) IVPB 900 mg     900 mg 100 mL/hr over 30 Minutes Intravenous Every 6 hours 09/24/12 1159 09/25/12 0148   09/24/12 1400  metroNIDAZOLE (FLAGYL) IVPB 500 mg     500 mg 100 mL/hr over 60 Minutes Intravenous Every 6 hours 09/24/12 1159 09/25/12  1610   09/24/12 0900  clindamycin (CLEOCIN) 900 mg, gentamicin (GARAMYCIN) 240 mg in sodium chloride 0.9 % 1,000 mL for intraperitoneal lavage      Intraperitoneal To Surgery 09/24/12 0824 09/24/12 0833   09/24/12 0715  clindamycin (CLEOCIN) IVPB 900 mg  Status:  Discontinued    Comments:  Pharmacy may adjust dosing strength, interval, or rate of medication as needed for optimal therapy for the patient Send with patient on call to the OR.  Anesthesia to complete antibiotic administration <21min  prior to incision per [facility].   900 mg 100 mL/hr over 30 Minutes Intravenous On call to O.R. 09/24/12 0701 09/24/12 1139

## 2012-09-29 ENCOUNTER — Encounter (HOSPITAL_COMMUNITY): Payer: Self-pay | Admitting: Surgery

## 2012-09-29 MED ORDER — NAPROXEN 500 MG PO TABS: 500.0000 mg | ORAL_TABLET | Freq: Two times a day (BID) | ORAL | Status: DC

## 2012-09-29 MED ORDER — METHOCARBAMOL 750 MG PO TABS: 750.0000 mg | ORAL_TABLET | Freq: Three times a day (TID) | ORAL | Status: DC | PRN

## 2012-09-29 NOTE — Discharge Summary (Signed)
Physician Discharge Summary  Patient ID: Dakota Vang MRN: [id number] DOB/AGE: August 20, 1965 47 y.o.  Admit date: 09/24/2012 Discharge date: 09/29/2012  Admission Diagnoses:  Discharge Diagnoses:  Principal Problem:   Diverticulitis of jejunum with microperforation Active Problems:   ADD   HYPERTENSION   G E R D   Discharged Condition: good  Hospital Course: Patient underwent surgery to remove the jejunal diverticuli on the day of admission.  Postoperatively, the patient was placed on an anti-ileus protocol.  The patient mobilized and advanced to a solid diet gradually.  Pain was well-controlled and transitioned off IV medications.    By the time of discharge, the patient was walking well the hallways, eating food well, having flatus.  Pain was-controlled on an oral regimen.  Based on meeting DC criteria and recovering well, I felt it was safe for the patient to be discharged home with close followup.  Instructions were discussed in detail with the patient and his wife in the presense of his RN.  They are written as well.    Consults: None  Significant Diagnostic Studies:   No results found for this or any previous visit (from the past 72 hour(s)).   Treatments:   POST-OPERATIVE DIAGNOSIS: jejunal diverticuli with inflammation  PROCEDURE: Procedure(s):  laparoscopic assisted resection of proximal jejunum containing the jejunal diverticulum  SURGEON: Surgeon(s):  Ardeth Sportsman, MD  Clovis Pu. Cornett, MD - Asst   Discharge Exam: Blood pressure 131/90, pulse 83, temperature 97.5 F (36.4 C), temperature source Oral, resp. rate 20, height 6\' 1"  (1.854 m), weight 221 lb 12.8 oz (100.608 kg), SpO2 100.00%.  General: Pt awake/alert/oriented x4 in no major acute distress Eyes: PERRL, normal EOM. Sclera nonicteric Neuro: CN II-XII intact w/o focal sensory/motor deficits. Lymph: No head/neck/groin lymphadenopathy Psych:  No delerium/psychosis/paranoia.  Calm.  Wife in  room HENT: Normocephalic, Mucus membranes moist.  No thrush Neck: Supple, No tracheal deviation Chest: No pain.  Good respiratory excursion. CV:  Pulses intact.  Regular rhythm MS: Normal AROM mjr joints.  No obvious deformity Abdomen: Soft, Nondistended.  Min tender at incision only.  No incarcerated hernias. Ext:  SCDs BLE.  No significant edema.  No cyanosis Skin: No petechiae / purpura   Disposition: 01-Home or Self Care  Discharge Orders   Future Appointments Provider Department Dept Phone   10/07/2012 11:45 AM Ardeth Sportsman, MD [facility], [place] [phone]   Future Orders Complete By Expires     Call MD for:  extreme fatigue  As directed     Call MD for:  hives  As directed     Call MD for:  persistant nausea and vomiting  As directed     Call MD for:  redness, tenderness, or signs of infection (pain, swelling, redness, odor or green/yellow discharge around incision site)  As directed     Call MD for:  severe uncontrolled pain  As directed     Call MD for:  As directed     Comments:      Temperature > 101.42F    Diet - low sodium heart healthy  As directed     Discharge instructions  As directed     Comments:      Please see discharge instruction sheets.  Also refer to handout given an office.  Please call our office if you have any questions or concerns [phone]    Discharge wound care:  As directed     Comments:  If you have closed incisions, shower and bathe over these incisions with soap and water every day.  Remove all surgical dressings on postoperative day #3.  You do not need to replace dressings over the closed incisions unless you feel more comfortable with a Band-Aid covering it.   If you have an open wound that requires packing, please see wound care instructions.  In general, remove all dressings, wash wound with soap and water and then replace with saline moistened gauze.  Do the dressing change at least every day.  Please call our office  [phone] if you have further questions.    Driving Restrictions  As directed     Comments:      No driving until off narcotics and can safely swerve away without pain during an emergency    Increase activity slowly  As directed     Comments:      Walk an hour a day.  Use 20-30 minute walks.  When you can walk 30 minutes without difficulty, increase to low impact/moderate activities such as biking, jogging, swimming, sexual activity..  Eventually can increase to unrestricted activity when not feeling pain.  If you feel pain: STOP!Marland Kitchen   Let pain protect you from overdoing it.  Use ice/heat/over-the-counter pain medications to help minimize his soreness.  Use pain prescriptions as needed to remain active.  It is better to take extra pain medications and be more active than to stay bedridden to avoid all pain medications.    Lifting restrictions  As directed     Comments:      Avoid heavy lifting initially.  Do not push through pain.  You have no specific weight limit.  Coughing and sneezing or four more stressful to your incision than any lifting you will do. Pain will protect you from injury.  Therefore, avoid intense activity until off all narcotic pain medications.  Coughing and sneezing or four more stressful to your incision than any lifting he will do.    May shower / Bathe  As directed     May walk up steps  As directed     Sexual Activity Restrictions  As directed     Comments:      Sexual activity as tolerated.  Do not push through pain.  Pain will protect you from injury.    Walk with assistance  As directed     Comments:      Walk over an hour a day.  May use a walker/cane/companion to help with balance and stamina.        Medication List         amLODipine 5 MG tablet  Commonly known as:  NORVASC  Take 5 mg by mouth daily with breakfast.     aspirin EC 81 MG tablet  Take 81 mg by mouth daily.     buPROPion 300 MG 24 hr tablet  Commonly known as:  WELLBUTRIN XL  Take 300  mg by mouth daily with breakfast.     carvedilol 6.25 MG tablet  Commonly known as:  COREG  Take 1 tablet (6.25 mg total) by mouth 2 (two) times daily with a meal. 1 by mouth twice daily     lamoTRIgine 150 MG tablet  Commonly known as:  LAMICTAL  Take 150 mg by mouth 2 (two) times daily.     methocarbamol 750 MG tablet  Commonly known as:  ROBAXIN  Take 1 tablet (750 mg total) by mouth every 8 (eight) hours as  needed (for muscle cramps).     methylphenidate 20 MG tablet  Commonly known as:  RITALIN  Take 20 mg by mouth 2 (two) times daily.     naproxen 500 MG tablet  Commonly known as:  NAPROSYN  Take 1 tablet (500 mg total) by mouth 2 (two) times daily with a meal.     omeprazole 20 MG capsule  Commonly known as:  PRILOSEC  Take 20 mg by mouth daily.     oxyCODONE 5 MG immediate release tablet  Commonly known as:  Oxy IR/ROXICODONE  Take 1-2 tablets (5-10 mg total) by mouth every 4 (four) hours as needed for pain.     PROBIOTIC DAILY PO  Take 1 tablet by mouth daily.     promethazine 12.5 MG tablet  Commonly known as:  PHENERGAN  Take 1 tablet (12.5 mg total) by mouth every 6 (six) hours as needed for nausea.     ramipril 10 MG capsule  Commonly known as:  ALTACE  Take 10 mg by mouth daily with breakfast.     rosuvastatin 20 MG tablet  Commonly known as:  CRESTOR  Take 20 mg by mouth 4 (four) times a week. Take on Sunday Monday Wednesday and Friday.           Follow-up Information   Follow up with Kalei Mckillop C., MD. Schedule an appointment as soon as possible for a visit in 2 weeks.   Contact information:   [address] [place] [address] [phone]       Signed: Ardeth Sportsman. 09/29/2012, 10:17 AM

## 2012-10-05 ENCOUNTER — Encounter (INDEPENDENT_AMBULATORY_CARE_PROVIDER_SITE_OTHER): Payer: Self-pay

## 2012-10-07 ENCOUNTER — Encounter (INDEPENDENT_AMBULATORY_CARE_PROVIDER_SITE_OTHER): Payer: 59 | Admitting: Surgery

## 2012-10-11 ENCOUNTER — Other Ambulatory Visit: Payer: Self-pay | Admitting: Internal Medicine

## 2012-10-11 ENCOUNTER — Other Ambulatory Visit (HOSPITAL_COMMUNITY): Payer: Self-pay | Admitting: Psychiatry

## 2012-10-11 DIAGNOSIS — F39 Unspecified mood [affective] disorder: Secondary | ICD-10-CM

## 2012-10-19 ENCOUNTER — Encounter (INDEPENDENT_AMBULATORY_CARE_PROVIDER_SITE_OTHER): Payer: Self-pay | Admitting: Surgery

## 2012-10-19 ENCOUNTER — Ambulatory Visit (INDEPENDENT_AMBULATORY_CARE_PROVIDER_SITE_OTHER): Payer: 59 | Admitting: Surgery

## 2012-10-19 VITALS — BP 130/84 | HR 82 | Resp 16 | Ht 73.0 in | Wt 212.6 lb

## 2012-10-19 DIAGNOSIS — K5900 Constipation, unspecified: Secondary | ICD-10-CM

## 2012-10-19 DIAGNOSIS — K5712 Diverticulitis of small intestine without perforation or abscess without bleeding: Secondary | ICD-10-CM

## 2012-10-19 DIAGNOSIS — K5730 Diverticulosis of large intestine without perforation or abscess without bleeding: Secondary | ICD-10-CM

## 2012-10-19 NOTE — Patient Instructions (Addendum)

## 2012-10-19 NOTE — Progress Notes (Signed)
Subjective:     Patient ID: Dakota Vang, male   DOB: 09-03-1965, 47 y.o.   MRN: [id number]  HPI  Dakota Vang  [birth year] [id number]  Patient Care Team: Pecola Lawless, MD as PCP - General Meryl Dare, MD as Consulting Physician (Gastroenterology) Cleotis Nipper, MD as Consulting Physician (Psychiatry)  This patient is a 47 y.o.male who presents today for surgical evaluation  POST-OPERATIVE DIAGNOSIS: jejunal diverticuli with inflammation   PROCEDURE 09/24/2012 laparoscopic assisted resection of proximal jejunum containing the jejunal diverticulum  SURGEON: Surgeon(s):  Ardeth Sportsman, MD  Clovis Pu. Cornett, MD - Asst      The patient comes back to near much better.  Initially needed to oxycodone every four hours, but the soreness at incision is down.  Eating well.  Energy level good.  No fevers or chills.  Bowels moving regularly.  He most normal things.  Concerned if okay to eat peanuts since he thinks that had started the flares in the past.  No bleeding.  Patient Active Problem List   Diagnosis Date Noted  . Pancolonic diverticulosis 09/09/2012  . Diverticulitis of jejunum with microperforation s/p lap resection [id number] 08/28/2012  . Constipation 08/27/2012  . ADD 11/20/2007  . COLONIC POLYPS, HX OF 11/20/2007  . HYPERLIPIDEMIA 06/12/2007  . HYPERTENSION 06/12/2007  . HEMORRHOIDS, INTERNAL 11/27/2006  . G E R D 09/19/2006    Past Medical History  Diagnosis Date  . GERD (gastroesophageal reflux disease)   . Colon polyps 2008    Dr Russella Dar  . Diverticulosis 2002    Dr Corinda Gubler  . Hyperlipidemia   . Diverticulitis     PMH of  . ADD (attention deficit disorder with hyperactivity)     Dr. Minta Balsam  . Anxiety     Dr. Minta Balsam  . Hypertension   . Diverticulitis of sigmoid colon 06/24/2008    2005 by CT scan    . Pancolonic diverticulosis 09/09/2012  . COLONIC POLYPS, HX OF 11/20/2007    Qualifier: Diagnosis of  By: Alwyn Ren MD, Chrissie Noa   Last colonoscopy  negative 2013 PMH of plyps X 2; Dr Russella Dar, GI    . Duodenal diverticulum 08/28/2012    Past Surgical History  Procedure Laterality Date  . Tonsillectomy    . Lasik      bilaterally  . Colonoscopy w/ polypectomy  2002 & 2008    Dr. Russella Dar; polyps X 2  . Colonoscopy  2013    Dr Russella Dar  . Laparoscopic partial colectomy N/A 09/24/2012    Procedure: laparoscopic assisted  resection of proximal jejunum containing the jejunal diverticulum;  Surgeon: Ardeth Sportsman, MD;  Location: [facility];  Service: General;  Laterality: N/A;    History   Social History  . Marital Status: Married    Spouse Name: N/A    Number of Children: N/A  . Years of Education: N/A   Occupational History  . Not on file.   Social History Main Topics  . Smoking status: Current Some Day Smoker    Types: Cigarettes  . Smokeless tobacco: Never Used     Comment: 1 cigar / month; previously up to 1/2 ppd for 5 years  . Alcohol Use: 3.6 - 4.8 oz/week    6-8 Cans of beer per week     Comment: Beer on weekend   . Drug Use: No  . Sexually Active: Not on file   Other Topics Concern  . Not on file   Social History Narrative  .  No narrative on file    Family History  Problem Relation Age of Onset  . Colon cancer Father     in 79s  . Lung cancer Father     smoker; asbestos exposure  . Cancer Father     Mesothelioma  . Coronary artery disease Mother     CAGB X 2 & angio 3-4X  . Cancer Mother     Gallbladder  . Coronary artery disease Maternal Grandmother   . COPD Maternal Grandmother   . Heart attack Maternal Grandfather     late 60's  . Heart attack Paternal Grandfather 45  . Diabetes Neg Hx   . Stroke Neg Hx     Current Outpatient Prescriptions  Medication Sig Dispense Refill  . amLODipine (NORVASC) 5 MG tablet Take 1 tablet (5 mg total)  by mouth daily.  90 tablet  1  . aspirin EC 81 MG tablet Take 81 mg by mouth daily.      Marland Kitchen buPROPion (WELLBUTRIN XL) 300 MG 24 hr tablet Take 300 mg by mouth daily  with breakfast.       . carvedilol (COREG) 6.25 MG tablet Take 1 tablet (6.25 mg total) by mouth 2 (two) times daily with a meal. 1 by mouth twice daily  180 tablet  1  . fluticasone (FLONASE) 50 MCG/ACT nasal spray Use 1 spray in each nostril up to two times daily  48 g  0  . lamoTRIgine (LAMICTAL) 150 MG tablet Take 1 tablet by mouth  twice a day  180 tablet  0  . methocarbamol (ROBAXIN) 750 MG tablet Take 1 tablet (750 mg total) by mouth every 8 (eight) hours as needed (for muscle cramps).  30 tablet  2  . methylphenidate (RITALIN) 20 MG tablet Take 20 mg by mouth 2 (two) times daily.      . naproxen (NAPROSYN) 500 MG tablet Take 1 tablet (500 mg total) by mouth 2 (two) times daily with a meal.  40 tablet  1  . omeprazole (PRILOSEC) 20 MG capsule Take 1 capsule by mouth  daily  90 capsule  1  . Probiotic Product (PROBIOTIC DAILY PO) Take 1 tablet by mouth daily.      . ramipril (ALTACE) 10 MG capsule Take 1 capsule (10 mg  total) by mouth daily.  90 capsule  1  . rosuvastatin (CRESTOR) 20 MG tablet Take 20 mg by mouth 4 (four) times a week. Take on Sunday Monday Wednesday and Friday.       No current facility-administered medications for this visit.     Allergies  Allergen Reactions  . Penicillins Other (See Comments)    ? Reaction @ 47 yrs old    BP 130/84  Pulse 82  Resp 16  Ht 6\' 1"  (1.854 m)  Wt 212 lb 9.6 oz (96.435 kg)  BMI 28.06 kg/m2  Dg Chest 2 View  09/21/2012   *RADIOLOGY REPORT*  Clinical Data: Preop for laparoscopic resection of small bowel.  CHEST - 2 VIEW  Comparison: Acute abdominal series 08/27/2012.  Findings: The heart size is normal.  The lungs are clear.  The visualized soft tissues and bony thorax are unremarkable.  IMPRESSION: Negative two-view chest.   Original Report Authenticated By: Marin Roberts, M.D.     Review of Systems  Constitutional: Negative for fever, chills and diaphoresis.  HENT: Negative for sore throat, trouble swallowing and neck  pain.   Eyes: Negative for photophobia and visual disturbance.  Respiratory: Negative for choking  and shortness of breath.   Cardiovascular: Negative for chest pain and palpitations.  Gastrointestinal: Negative for nausea, vomiting, abdominal distention, anal bleeding and rectal pain.  Genitourinary: Negative for dysuria, urgency, difficulty urinating and testicular pain.  Musculoskeletal: Negative for myalgias, arthralgias and gait problem.  Skin: Negative for color change and rash.  Neurological: Negative for dizziness, speech difficulty, weakness and numbness.  Hematological: Negative for adenopathy.  Psychiatric/Behavioral: Negative for hallucinations, confusion and agitation.       Objective:   Physical Exam  Constitutional: He is oriented to person, place, and time. He appears well-developed and well-nourished. No distress.  HENT:  Head: Normocephalic.  Mouth/Throat: Oropharynx is clear and moist. No oropharyngeal exudate.  Eyes: Conjunctivae and EOM are normal. Pupils are equal, round, and reactive to light. No scleral icterus.  Neck: Normal range of motion. No tracheal deviation present.  Cardiovascular: Normal rate, normal heart sounds and intact distal pulses.   Pulmonary/Chest: Effort normal. No respiratory distress.  Abdominal: Soft. He exhibits no distension. There is no tenderness. Hernia confirmed negative in the right inguinal area and confirmed negative in the left inguinal area.  Incisions clean with normal healing ridges.  No hernias  Musculoskeletal: Normal range of motion. He exhibits no tenderness.  Neurological: He is alert and oriented to person, place, and time. No cranial nerve deficit. He exhibits normal muscle tone. Coordination normal.  Skin: Skin is warm and dry. No rash noted. He is not diaphoretic.  Psychiatric: He has a normal mood and affect. His behavior is normal.       Assessment:     Three status post laparoscopically assisted small bowel  resection of jejunal diverticuli, one with persistent perforation/diverticulitis.    Plan:     Increase activity as tolerated to regular activity.  Low impact exercise such as walking an hour a day at least ideal.  Do not push through pain.  Diet as tolerated.  Low fat high fiber diet ideal.  Bowel regimen with 30 g fiber a day and fiber supplement as needed to avoid problems.  OK date anything he wants within reason, but avoiding peanuts and was told to make sense.  I am glad he is made a lot of recovery these past few weeks.  He was very appreciative of the extra time and care in our office & at the hospital.  Return to clinic as needed.   Instructions discussed.  Followup with primary care physician for other health issues as would normally be done.  Questions answered.  The patient expressed understanding and appreciation

## 2012-11-10 ENCOUNTER — Telehealth (HOSPITAL_COMMUNITY): Payer: Self-pay | Admitting: *Deleted

## 2012-11-10 DIAGNOSIS — F988 Other specified behavioral and emotional disorders with onset usually occurring in childhood and adolescence: Secondary | ICD-10-CM

## 2012-11-10 MED ORDER — METHYLPHENIDATE HCL 20 MG PO TABS: ORAL_TABLET | ORAL | Status: DC

## 2012-11-10 NOTE — Telephone Encounter (Signed)
Medication refilled as requested.

## 2012-11-10 NOTE — Telephone Encounter (Signed)
error 

## 2012-11-16 ENCOUNTER — Telehealth (HOSPITAL_COMMUNITY): Payer: Self-pay

## 2012-11-16 NOTE — Telephone Encounter (Signed)
11/16/12 1:53pm Patient came and pick-up rx script.Dakota KitchenMarguerite Olea

## 2012-12-09 ENCOUNTER — Encounter (HOSPITAL_COMMUNITY): Payer: Self-pay | Admitting: Psychiatry

## 2012-12-09 ENCOUNTER — Ambulatory Visit (INDEPENDENT_AMBULATORY_CARE_PROVIDER_SITE_OTHER): Payer: 59 | Admitting: Psychiatry

## 2012-12-09 ENCOUNTER — Ambulatory Visit (HOSPITAL_COMMUNITY): Payer: Self-pay | Admitting: Psychiatry

## 2012-12-09 VITALS — BP 128/88 | HR 75 | Ht 73.0 in | Wt 214.0 lb

## 2012-12-09 DIAGNOSIS — F988 Other specified behavioral and emotional disorders with onset usually occurring in childhood and adolescence: Secondary | ICD-10-CM

## 2012-12-09 DIAGNOSIS — F39 Unspecified mood [affective] disorder: Secondary | ICD-10-CM

## 2012-12-09 MED ORDER — METHYLPHENIDATE HCL 20 MG PO TABS: ORAL_TABLET | ORAL | Status: DC

## 2012-12-09 MED ORDER — BUPROPION HCL ER (XL) 300 MG PO TB24: 300.0000 mg | ORAL_TABLET | Freq: Every day | ORAL | Status: DC

## 2012-12-09 MED ORDER — LAMOTRIGINE 150 MG PO TABS: ORAL_TABLET | ORAL | Status: DC

## 2012-12-09 NOTE — Progress Notes (Signed)
[facility] [address] Progress Note  Dakota Vang [id number] 47 y.o.  12/09/2012 11:26 AM  Chief Complaint:  I am doing better now.  I had a surgery.      History of Present Illness:  Patient is 47 year old Caucasian married employed male who came for his followup appointment.  Patient recently had surgery for his diverticulitis.  He is recovering from surgery.  Sometimes he feels fatigued but overall he is feeling better.  He is taking his medication.  He is working mostly from home however he denies any recent irritability anger worsening or agitation. He is slee he has lost weight from the past.  He was a concern about his GI symptoms however since he had a surgery his symptoms are much improved.  His compliance with the stimulant and Wellbutrin.  He liked to get Wellbutrin from this office which has been given by his primary care physician the past few years.  Is not drinking or using any illegal substance.  He is able to do multitasking and does not report any side effects including chest pain, insomnia, palpitation or tremors.  He denies any rash or itching.  He likes his medication.  On his last visit we increased Ritalin as patient was complaining of poor attention and concentration.  He seemed much improvement with increased dose of Ritalin.  He is not abusing or asking early refills.  He claimed to be very busy at home because his 3 kids goes to different school and they have different timing.  He denies any recent issues at work.  Suicidal Ideation: No Plan Formed: No Patient has means to carry out plan: No  Homicidal Ideation: No Plan Formed: No Patient has means to carry out plan: No  Review of Systems: Psychiatric: Agitation: No Hallucination: No Depressed Mood: No Insomnia: No Hypersomnia: Yes Altered Concentration: No Feels Worthless: No Grandiose Ideas: No Belief In Special Powers: No New/Increased Substance Abuse: No Compulsions:  No  Neurologic: Headache: No Seizure: No Paresthesias: No  Medical History. Patient has multiple medical problems including diverticulitis, hyperlipidemia, hypertension, back pain and GERD.  His primary care physician is Marga Melnick .    Family and Social History: Patient endorse mother has depression and history of psychiatric inpatient treatment.  Patient is born and raised in [place].  He has a good childhood.  He's been living in [place] for past few years.  He's been married for more than 11 years.  He has 3 children.  He has a good social network.  He denies any history of abuse in the past.  Alcohol and substance use history. Patient admitted history of DWI and drinking alcohol in the past however when to be sober since he is taking Ritalin.  Outpatient Encounter Prescriptions as of 12/09/2012  Medication Sig Dispense Refill  . amLODipine (NORVASC) 5 MG tablet Take 1 tablet (5 mg total)  by mouth daily.  90 tablet  1  . aspirin EC 81 MG tablet Take 81 mg by mouth daily.      Marland Kitchen buPROPion (WELLBUTRIN XL) 300 MG 24 hr tablet Take 1 tablet (300 mg total) by mouth daily with breakfast.  90 tablet  0  . carvedilol (COREG) 6.25 MG tablet Take 1 tablet (6.25 mg total) by mouth 2 (two) times daily with a meal. 1 by mouth twice daily  180 tablet  1  . fluticasone (FLONASE) 50 MCG/ACT nasal spray Use 1 spray in each nostril up to two times daily  48  g  0  . lamoTRIgine (LAMICTAL) 150 MG tablet Take 1 tablet by mouth  twice a day  180 tablet  0  . methylphenidate (RITALIN) 20 MG tablet Take one in AM and one at NOON  60 tablet  0  . omeprazole (PRILOSEC) 20 MG capsule Take 1 capsule by mouth  daily  90 capsule  1  . Probiotic Product (PROBIOTIC DAILY PO) Take 1 tablet by mouth daily.      . ramipril (ALTACE) 10 MG capsule Take 1 capsule (10 mg  total) by mouth daily.  90 capsule  1  . rosuvastatin (CRESTOR) 20 MG tablet Take 20 mg by mouth 4 (four) times a week. Take on Sunday Monday  Wednesday and Friday.      . [DISCONTINUED] buPROPion (WELLBUTRIN XL) 300 MG 24 hr tablet Take 300 mg by mouth daily with breakfast.       . [DISCONTINUED] lamoTRIgine (LAMICTAL) 150 MG tablet Take 1 tablet by mouth  twice a day  180 tablet  0  . [DISCONTINUED] methylphenidate (RITALIN) 20 MG tablet Take one in AM and one at NOON  60 tablet  0  . [DISCONTINUED] methylphenidate (RITALIN) 20 MG tablet Take one in AM and one at NOON  60 tablet  0  . [DISCONTINUED] methocarbamol (ROBAXIN) 750 MG tablet Take 1 tablet (750 mg total) by mouth every 8 (eight) hours as needed (for muscle cramps).  30 tablet  2  . [DISCONTINUED] naproxen (NAPROSYN) 500 MG tablet Take 1 tablet (500 mg total) by mouth 2 (two) times daily with a meal.  40 tablet  1   No facility-administered encounter medications on file as of 12/09/2012.    Past Psychiatric History/Hospitalization(s): Anxiety: Yes Bipolar Disorder: No Depression: Patient has history of depression and mood swing for a long time.  He start taking psychotropic medication by his primary care physician Dr. Marga Melnick.  In the past she was given Prozac but limited response.   Mania: No Psychosis: No Schizophrenia: No Personality Disorder: No Hospitalization for psychiatric illness: No History of Electroconvulsive Shock Therapy: No Prior Suicide Attempts: No  Physical Exam: Constitutional:  BP 128/88  Pulse 75  Ht 6\' 1"  (1.854 m)  Wt 214 lb (97.07 kg)  BMI 28.24 kg/m2  General Appearance: well nourished and obese  Recent Results (from the past 2160 hour(s))  BASIC METABOLIC PANEL     Status: Abnormal   Collection Time    09/21/12 10:15 AM      Result Value Range   Sodium 139  135 - 145 mEq/L   Potassium 4.3  3.5 - 5.1 mEq/L   Chloride 103  96 - 112 mEq/L   CO2 26  19 - 32 mEq/L   Glucose, Bld 121 (*) 70 - 99 mg/dL   BUN 10  6 - 23 mg/dL   Creatinine, Ser 1.91  0.50 - 1.35 mg/dL   Calcium 9.9  8.4 - 47.8 mg/dL   GFR calc non Af Amer >90   >90 mL/min   GFR calc Af Amer >90  >90 mL/min   Comment:            The eGFR has been calculated     using the CKD EPI equation.     This calculation has not been     validated in all clinical     situations.     eGFR's persistently     <90 mL/min signify     possible Chronic Kidney Disease.  CBC     Status: None   Collection Time    09/21/12 10:15 AM      Result Value Range   WBC 8.5  4.0 - 10.5 K/uL   RBC 5.14  4.22 - 5.81 MIL/uL   Hemoglobin 14.8  13.0 - 17.0 g/dL   HCT 16.1  09.6 - 04.5 %   MCV 84.8  78.0 - 100.0 fL   MCH 28.8  26.0 - 34.0 pg   MCHC 33.9  30.0 - 36.0 g/dL   RDW 40.9  81.1 - 91.4 %   Platelets 206  150 - 400 K/uL  TYPE AND SCREEN     Status: None   Collection Time    09/24/12  5:50 AM      Result Value Range   ABO/RH(D) B NEG     Antibody Screen NEG     Sample Expiration 09/27/2012    ABO/RH     Status: None   Collection Time    09/24/12  6:00 AM      Result Value Range   ABO/RH(D) B NEG    CBC     Status: Abnormal   Collection Time    09/24/12 12:30 PM      Result Value Range   WBC 13.3 (*) 4.0 - 10.5 K/uL   RBC 4.87  4.22 - 5.81 MIL/uL   Hemoglobin 14.3  13.0 - 17.0 g/dL   HCT 78.2  95.6 - 21.3 %   MCV 84.4  78.0 - 100.0 fL   MCH 29.4  26.0 - 34.0 pg   MCHC 34.8  30.0 - 36.0 g/dL   RDW 08.6  57.8 - 46.9 %   Platelets 197  150 - 400 K/uL  CREATININE, SERUM     Status: None   Collection Time    09/24/12 12:30 PM      Result Value Range   Creatinine, Ser 0.94  0.50 - 1.35 mg/dL   GFR calc non Af Amer >90  >90 mL/min   GFR calc Af Amer >90  >90 mL/min   Comment:            The eGFR has been calculated     using the CKD EPI equation.     This calculation has not been     validated in all clinical     situations.     eGFR's persistently     <90 mL/min signify     possible Chronic Kidney Disease.  BASIC METABOLIC PANEL     Status: Abnormal   Collection Time    09/25/12  4:05 AM      Result Value Range   Sodium 137  135 - 145 mEq/L    Potassium 4.0  3.5 - 5.1 mEq/L   Chloride 100  96 - 112 mEq/L   CO2 29  19 - 32 mEq/L   Glucose, Bld 162 (*) 70 - 99 mg/dL   BUN 9  6 - 23 mg/dL   Creatinine, Ser 6.29  0.50 - 1.35 mg/dL   Calcium 9.3  8.4 - 52.8 mg/dL   GFR calc non Af Amer >90  >90 mL/min   GFR calc Af Amer >90  >90 mL/min   Comment:            The eGFR has been calculated     using the CKD EPI equation.     This calculation has not been     validated in all clinical  situations.     eGFR's persistently     <90 mL/min signify     possible Chronic Kidney Disease.  CBC     Status: Abnormal   Collection Time    09/25/12  4:05 AM      Result Value Range   WBC 11.6 (*) 4.0 - 10.5 K/uL   RBC 4.37  4.22 - 5.81 MIL/uL   Hemoglobin 12.8 (*) 13.0 - 17.0 g/dL   HCT 96.0 (*) 45.4 - 09.8 %   MCV 86.7  78.0 - 100.0 fL   MCH 29.3  26.0 - 34.0 pg   MCHC 33.8  30.0 - 36.0 g/dL   RDW 11.9  14.7 - 82.9 %   Platelets 173  150 - 400 K/uL  MAGNESIUM     Status: None   Collection Time    09/25/12  4:05 AM      Result Value Range   Magnesium 1.9  1.5 - 2.5 mg/dL   Musculoskeletal: Strength & Muscle Tone: within normal limits Gait & Station: normal Patient leans: N/A  Psychiatric: Speech (describe rate, volume, coherence, spontaneity, and abnormalities if any): clear and coherent with normal tone volume.  Thought Process (describe rate, content, abstract reasoning, and computation): Logical and goal-directed.  No flight of ideas or any loose association.  Associations: Coherent, Relevant and Intact  Thoughts: normal  Mental Status: Orientation: oriented to person, place, time/date and situation Mood & Affect: Tired and affect is mood appropriate. Attention Span & Concentration: Good  Medical Decision Making (Choose Three): Established Problem, Stable/Improving (1), Review or order clinical lab tests (1), Established Problem, Worsening (2), Review of Last Therapy Session (1), Review of Medication Regimen & Side  Effects (2) and Review of New Medication or Change in Dosage (2)  Assessment: Axis I: Mood disorder NOS, attention deficit disorder.  Axis II: Deferred  Axis III:  Patient Active Problem List   Diagnosis Date Noted  . Pancolonic diverticulosis 09/09/2012  . Diverticulitis of jejunum with microperforation s/p lap resection [id number] 08/28/2012  . Constipation 08/27/2012  . ADD 11/20/2007  . COLONIC POLYPS, HX OF 11/20/2007  . HYPERLIPIDEMIA 06/12/2007  . HYPERTENSION 06/12/2007  . HEMORRHOIDS, INTERNAL 11/27/2006  . G E R D 09/19/2006    Axis IV: Mild to moderate  Axis V: 65-70   Plan:  I review his blood work, progress note, recent discharge summary and current medication.  I will continue current Ritalin 20 mg 1 in the morning and 1 at noon, Lamictal 150 mg twice a day .  He like to get Wellbutrin from this office.  Neither prescription for Wellbutrin XL 300 mg given to the pharmacy.  Discuss in detail the risks and benefits of medication.  Recommend to call us back if he has any question or any concern.  He has lost weight from the past however he is not concerned because his appetite is much better now since the surgery.  Recommend to call us back if he has any question or any concern.  I will see him again in 3 months.  Time spent 25 minutes.  More than 50% of the time spent in psychoeducation, counseling and coordination of care.  Discuss safety plan that anytime having active suicidal thoughts or homicidal thoughts then patient need to call 911 or go to the local emergency room.  Portion of this note is generated with voice dictation software and may contain typographical error.  Layana Konkel T., MD 12/09/2012

## 2013-01-14 ENCOUNTER — Other Ambulatory Visit: Payer: Self-pay

## 2013-02-12 ENCOUNTER — Other Ambulatory Visit (HOSPITAL_COMMUNITY): Payer: Self-pay | Admitting: Psychiatry

## 2013-02-12 ENCOUNTER — Telehealth (HOSPITAL_COMMUNITY): Payer: Self-pay | Admitting: Psychiatry

## 2013-02-12 DIAGNOSIS — F988 Other specified behavioral and emotional disorders with onset usually occurring in childhood and adolescence: Secondary | ICD-10-CM

## 2013-02-12 MED ORDER — METHYLPHENIDATE HCL 20 MG PO TABS: ORAL_TABLET | ORAL | Status: DC

## 2013-02-19 ENCOUNTER — Other Ambulatory Visit (HOSPITAL_COMMUNITY): Payer: Self-pay | Admitting: Psychiatry

## 2013-02-19 NOTE — Telephone Encounter (Signed)
Given on 12/09/12 for 90 days

## 2013-03-01 ENCOUNTER — Other Ambulatory Visit: Payer: Self-pay | Admitting: Internal Medicine

## 2013-03-02 NOTE — Telephone Encounter (Signed)
Amlodipine refilled per protocol. JG//CMA 

## 2013-03-15 ENCOUNTER — Ambulatory Visit (INDEPENDENT_AMBULATORY_CARE_PROVIDER_SITE_OTHER): Payer: 59 | Admitting: Psychiatry

## 2013-03-15 ENCOUNTER — Encounter (HOSPITAL_COMMUNITY): Payer: Self-pay | Admitting: Psychiatry

## 2013-03-15 VITALS — Wt 214.0 lb

## 2013-03-15 DIAGNOSIS — F39 Unspecified mood [affective] disorder: Secondary | ICD-10-CM

## 2013-03-15 DIAGNOSIS — F988 Other specified behavioral and emotional disorders with onset usually occurring in childhood and adolescence: Secondary | ICD-10-CM

## 2013-03-15 MED ORDER — METHYLPHENIDATE HCL 20 MG PO TABS: ORAL_TABLET | ORAL | Status: DC

## 2013-03-15 MED ORDER — BUPROPION HCL ER (XL) 300 MG PO TB24: 300.0000 mg | ORAL_TABLET | Freq: Every day | ORAL | Status: DC

## 2013-03-15 MED ORDER — LAMOTRIGINE 150 MG PO TABS: ORAL_TABLET | ORAL | Status: DC

## 2013-03-15 NOTE — Progress Notes (Signed)
Dakota Vang  Dakota Vang [id number] 48 y.o.  03/15/2013 11:20 AM  Chief Complaint:  Medication management and followup.      History of Present Illness:  Dakota Vang came for his followup appointment.  He had surgery for diverticulitis.  Is doing much better.  He is very happy because he is able to pass his exam and got promoted.  He had a good Christmas.  Sometimes we do get anxious but overall his mood, irritability, depression and attention her focus is improved.  He was to continue his current psychotropic medication.  He's sleeping better.  He is able to lost weight .  He denies any insomnia, chest pain, irritability or any anger.  He is not drinking or using any substances.  He is able to do multitasking.  He is scheduled to see his primary care physician Dr. Linna Vang for his annual physical checkup.  Suicidal Ideation: No Plan Formed: No Patient has means to carry out plan: No  Homicidal Ideation: No Plan Formed: No Patient has means to carry out plan: No  Review of Systems: Psychiatric: Agitation: No Hallucination: No Depressed Mood: No Insomnia: No Hypersomnia: Yes Altered Concentration: No Feels Worthless: No Grandiose Ideas: No Belief In Special Powers: No New/Increased Substance Abuse: No Compulsions: No  Neurologic: Headache: No Seizure: No Paresthesias: No  Medical History. Patient has multiple medical problems including diverticulitis, hyperlipidemia, hypertension, back pain and GERD.  His primary care physician is Dakota Vang .    Family and Social History: Patient endorse mother has depression and history of psychiatric inpatient treatment.  Patient is born and raised in [place].  He has a good childhood.  He lives with his wife and his 3 children.  He has good social network.  He is employed .  He denies any history of abuse in the past.  Alcohol and substance use history. Patient admitted history of DWI and drinking alcohol  in the past however claims to be sober since he is taking Ritalin.  Outpatient Encounter Prescriptions as of 03/15/2013  Medication Sig  . amLODipine (NORVASC) 5 MG tablet Take 1 tablet (5 mg total)  by mouth daily.  Marland Kitchen aspirin EC 81 MG tablet Take 81 mg by mouth daily.  Marland Kitchen buPROPion (WELLBUTRIN XL) 300 MG 24 hr tablet Take 1 tablet (300 mg total) by mouth daily with breakfast.  . carvedilol (COREG) 6.25 MG tablet Take 1 tablet (6.25 mg total) by mouth 2 (two) times daily with a meal. 1 by mouth twice daily  . lamoTRIgine (LAMICTAL) 150 MG tablet Take 1 tablet by mouth  twice a day  . methylphenidate (RITALIN) 20 MG tablet Take one in AM and one at [facility]  . omeprazole (PRILOSEC) 20 MG capsule Take 1 capsule by mouth  daily  . Probiotic Product (PROBIOTIC DAILY PO) Take 1 tablet by mouth daily.  . rosuvastatin (CRESTOR) 20 MG tablet Take 20 mg by mouth 4 (four) times a week. Take on Sunday Monday Wednesday and Friday.  . [DISCONTINUED] buPROPion (WELLBUTRIN XL) 300 MG 24 hr tablet Take 1 tablet (300 mg total) by mouth daily with breakfast.  . [DISCONTINUED] lamoTRIgine (LAMICTAL) 150 MG tablet Take 1 tablet by mouth  twice a day  . [DISCONTINUED] methylphenidate (RITALIN) 20 MG tablet Take one in AM and one at [facility]  . [DISCONTINUED] methylphenidate (RITALIN) 20 MG tablet Take one in AM and one at [facility]  . [DISCONTINUED] methylphenidate (RITALIN) 20 MG tablet Take one in AM  and one at [facility]  . ramipril (ALTACE) 10 MG capsule Take 1 capsule (10 mg  total) by mouth daily.  . [DISCONTINUED] fluticasone (FLONASE) 50 MCG/ACT nasal spray Use 1 spray in each nostril up to two times daily    Past Psychiatric History/Hospitalization(s): Anxiety: Yes Bipolar Disorder: No Depression: Patient has history of depression and mood swing for a long time.  He start taking psychotropic medication by his primary care physician Dr. Unice Vang.  In the past he was given Prozac but limited response.   Mania:  No Psychosis: No Schizophrenia: No Personality Disorder: No Hospitalization for psychiatric illness: No History of Electroconvulsive Shock Therapy: No Prior Suicide Attempts: No  Physical Exam: Constitutional:  Wt 214 lb (97.07 kg)  General Appearance: alert, oriented, no acute distress and well nourished  No results found for this or any previous visit (from the past 2160 hour(s)). Musculoskeletal: Strength & Muscle Tone: within normal limits Gait & Station: normal Patient leans: N/A  Psychiatric: Speech (describe rate, volume, coherence, spontaneity, and abnormalities if any): clear and coherent with normal tone volume.  Thought Process (describe rate, content, abstract reasoning, and computation): Logical and goal-directed.  No flight of ideas or any loose association.  Associations: Coherent, Relevant and Intact  Thoughts: normal  Mental Status: Orientation: oriented to person, place, time/date and situation Mood & Affect: Tired and affect is mood appropriate. Attention Span & Concentration: Good  Medical Decision Making (Choose Three): Established Problem, Stable/Improving (1), Review of Last Therapy Session (1) and Review of New Medication or Change in Dosage (2)  Assessment: Axis I: Mood disorder NOS, attention deficit disorder.  Axis II: Deferred  Axis III:  Patient Active Problem List   Diagnosis Date Noted  . Pancolonic diverticulosis 09/09/2012  . Diverticulitis of jejunum with microperforation s/p lap resection [id number] 08/28/2012  . Constipation 08/27/2012  . ADD 11/20/2007  . COLONIC POLYPS, HX OF 11/20/2007  . HYPERLIPIDEMIA 06/12/2007  . HYPERTENSION 06/12/2007  . HEMORRHOIDS, INTERNAL 11/27/2006  . G E R D 09/19/2006    Axis IV: Mild to moderate  Axis V: 65-70   Plan:  Patient is doing better on his Ritalin 20 mg in the morning and 1 mg at noon, Lamictal 50 mg twice a day and Wellbutrin XL 150 mg daily.  Recommended to call is back if he  has any question or any concern.  Followup in 3 months.  Portion of this Vang is generated with voice dictation software and may contain typographical error.  Georgian Mcclory T., MD 03/15/2013

## 2013-03-26 ENCOUNTER — Telehealth (INDEPENDENT_AMBULATORY_CARE_PROVIDER_SITE_OTHER): Payer: Self-pay

## 2013-03-26 NOTE — Telephone Encounter (Signed)
Message copied by Illene Regulus on Fri Mar 26, 2013 10:45 AM ------      Message from: Salvatore Marvel      Created: Wed Mar 24, 2013 11:24 AM      Regarding: Dr. Johney Maine Non-Urgent Question      Contact: [phone]       Patient called and wants to speak with Dr. Johney Maine about he has a little pain like before he had the jejunum surgery, please call him.            Thank you. ------

## 2013-03-26 NOTE — Telephone Encounter (Signed)
LMOM returning pt's call. I asked for the pt to call me back today while im working with Dr Johney Maine so I can run any messages by him while were in the clinic.

## 2013-03-29 ENCOUNTER — Encounter (HOSPITAL_COMMUNITY): Payer: Self-pay | Admitting: Emergency Medicine

## 2013-03-29 ENCOUNTER — Emergency Department (HOSPITAL_COMMUNITY): Payer: 59

## 2013-03-29 ENCOUNTER — Inpatient Hospital Stay (HOSPITAL_COMMUNITY)
Admission: EM | Admit: 2013-03-29 | Discharge: 2013-04-03 | DRG: 392 | Disposition: A | Discharge status: Home / Self Care | Payer: 59 | Attending: Surgery | Admitting: Surgery

## 2013-03-29 DIAGNOSIS — R110 Nausea: Secondary | ICD-10-CM | POA: Diagnosis present

## 2013-03-29 DIAGNOSIS — F411 Generalized anxiety disorder: Secondary | ICD-10-CM | POA: Diagnosis present

## 2013-03-29 DIAGNOSIS — K5732 Diverticulitis of large intestine without perforation or abscess without bleeding: Secondary | ICD-10-CM

## 2013-03-29 DIAGNOSIS — F988 Other specified behavioral and emotional disorders with onset usually occurring in childhood and adolescence: Secondary | ICD-10-CM | POA: Diagnosis present

## 2013-03-29 DIAGNOSIS — Z6827 Body mass index (BMI) 27.0-27.9, adult: Secondary | ICD-10-CM

## 2013-03-29 DIAGNOSIS — K648 Other hemorrhoids: Secondary | ICD-10-CM | POA: Diagnosis present

## 2013-03-29 DIAGNOSIS — F17200 Nicotine dependence, unspecified, uncomplicated: Secondary | ICD-10-CM | POA: Diagnosis present

## 2013-03-29 DIAGNOSIS — K5730 Diverticulosis of large intestine without perforation or abscess without bleeding: Secondary | ICD-10-CM | POA: Diagnosis present

## 2013-03-29 DIAGNOSIS — K219 Gastro-esophageal reflux disease without esophagitis: Secondary | ICD-10-CM | POA: Diagnosis present

## 2013-03-29 DIAGNOSIS — R197 Diarrhea, unspecified: Secondary | ICD-10-CM | POA: Diagnosis present

## 2013-03-29 DIAGNOSIS — Z800 Family history of malignant neoplasm of digestive organs: Secondary | ICD-10-CM

## 2013-03-29 DIAGNOSIS — I10 Essential (primary) hypertension: Secondary | ICD-10-CM | POA: Diagnosis present

## 2013-03-29 DIAGNOSIS — E785 Hyperlipidemia, unspecified: Secondary | ICD-10-CM | POA: Diagnosis present

## 2013-03-29 DIAGNOSIS — F909 Attention-deficit hyperactivity disorder, unspecified type: Secondary | ICD-10-CM | POA: Diagnosis present

## 2013-03-29 DIAGNOSIS — Z8249 Family history of ischemic heart disease and other diseases of the circulatory system: Secondary | ICD-10-CM

## 2013-03-29 DIAGNOSIS — K5720 Diverticulitis of large intestine with perforation and abscess without bleeding: Secondary | ICD-10-CM | POA: Diagnosis present

## 2013-03-29 DIAGNOSIS — Z880 Allergy status to penicillin: Secondary | ICD-10-CM

## 2013-03-29 DIAGNOSIS — K5712 Diverticulitis of small intestine without perforation or abscess without bleeding: Secondary | ICD-10-CM | POA: Diagnosis present

## 2013-03-29 DIAGNOSIS — F39 Unspecified mood [affective] disorder: Secondary | ICD-10-CM | POA: Diagnosis present

## 2013-03-29 DIAGNOSIS — K630 Abscess of intestine: Secondary | ICD-10-CM | POA: Diagnosis present

## 2013-03-29 DIAGNOSIS — K5900 Constipation, unspecified: Secondary | ICD-10-CM | POA: Diagnosis present

## 2013-03-29 DIAGNOSIS — Z9049 Acquired absence of other specified parts of digestive tract: Secondary | ICD-10-CM

## 2013-03-29 DIAGNOSIS — Z801 Family history of malignant neoplasm of trachea, bronchus and lung: Secondary | ICD-10-CM

## 2013-03-29 DIAGNOSIS — D72829 Elevated white blood cell count, unspecified: Secondary | ICD-10-CM | POA: Diagnosis present

## 2013-03-29 DIAGNOSIS — Z860100 Personal history of colon polyps, unspecified: Secondary | ICD-10-CM

## 2013-03-29 DIAGNOSIS — R509 Fever, unspecified: Secondary | ICD-10-CM | POA: Diagnosis present

## 2013-03-29 HISTORY — DX: Diverticulitis of small intestine without perforation or abscess without bleeding: K57.12

## 2013-03-29 LAB — URINALYSIS, ROUTINE W REFLEX MICROSCOPIC
Bilirubin Urine: NEGATIVE
Glucose, UA: NEGATIVE mg/dL
Hgb urine dipstick: NEGATIVE
KETONES UR: NEGATIVE mg/dL
LEUKOCYTES UA: NEGATIVE
NITRITE: NEGATIVE
PROTEIN: NEGATIVE mg/dL
Specific Gravity, Urine: 1.021 (ref 1.005–1.030)
UROBILINOGEN UA: 0.2 mg/dL (ref 0.0–1.0)
pH: 5.5 (ref 5.0–8.0)

## 2013-03-29 LAB — CBC WITH DIFFERENTIAL/PLATELET
BASOS PCT: 0 % (ref 0–1)
Basophils Absolute: 0.1 10*3/uL (ref 0.0–0.1)
EOS PCT: 0 % (ref 0–5)
Eosinophils Absolute: 0 10*3/uL (ref 0.0–0.7)
HEMATOCRIT: 43.3 % (ref 39.0–52.0)
HEMOGLOBIN: 15.2 g/dL (ref 13.0–17.0)
Lymphocytes Relative: 7 % — ABNORMAL LOW (ref 12–46)
Lymphs Abs: 1.5 10*3/uL (ref 0.7–4.0)
MCH: 30 pg (ref 26.0–34.0)
MCHC: 35.1 g/dL (ref 30.0–36.0)
MCV: 85.6 fL (ref 78.0–100.0)
MONO ABS: 1.3 10*3/uL — AB (ref 0.1–1.0)
MONOS PCT: 6 % (ref 3–12)
NEUTROS ABS: 17.9 10*3/uL — AB (ref 1.7–7.7)
Neutrophils Relative %: 86 % — ABNORMAL HIGH (ref 43–77)
Platelets: 230 10*3/uL (ref 150–400)
RBC: 5.06 MIL/uL (ref 4.22–5.81)
RDW: 12.5 % (ref 11.5–15.5)
WBC: 20.7 10*3/uL — ABNORMAL HIGH (ref 4.0–10.5)

## 2013-03-29 LAB — COMPREHENSIVE METABOLIC PANEL
ALT: 20 U/L (ref 0–53)
AST: 14 U/L (ref 0–37)
Albumin: 4.2 g/dL (ref 3.5–5.2)
Alkaline Phosphatase: 87 U/L (ref 39–117)
BILIRUBIN TOTAL: 0.3 mg/dL (ref 0.3–1.2)
BUN: 14 mg/dL (ref 6–23)
CALCIUM: 9.5 mg/dL (ref 8.4–10.5)
CHLORIDE: 100 meq/L (ref 96–112)
CO2: 22 meq/L (ref 19–32)
CREATININE: 0.77 mg/dL (ref 0.50–1.35)
GFR calc Af Amer: >90 mL/min (ref >90)
Glucose, Bld: 107 mg/dL — ABNORMAL HIGH (ref 70–99)
Potassium: 4.2 mEq/L (ref 3.7–5.3)
Sodium: 137 mEq/L (ref 137–147)
Total Protein: 7.2 g/dL (ref 6.0–8.3)

## 2013-03-29 LAB — LIPASE, BLOOD: LIPASE: 43 U/L (ref 11–59)

## 2013-03-29 MED ORDER — IOHEXOL 300 MG/ML  SOLN
100.0000 mL | Freq: Once | INTRAMUSCULAR | Status: AC | PRN
  Administered 2013-03-29: 100 mL via INTRAVENOUS

## 2013-03-29 MED ORDER — BUPROPION HCL ER (XL) 300 MG PO TB24
300.0000 mg | ORAL_TABLET | Freq: Every day | ORAL | Status: DC
  Administered 2013-03-30 – 2013-04-03 (×5): 300 mg via ORAL
  Filled 2013-03-29 (×6): qty 1

## 2013-03-29 MED ORDER — HYDROMORPHONE HCL PF 1 MG/ML IJ SOLN
1.0000 mg | Freq: Once | INTRAMUSCULAR | Status: AC
  Administered 2013-03-29: 1 mg via INTRAVENOUS
  Filled 2013-03-29: qty 1

## 2013-03-29 MED ORDER — DIPHENHYDRAMINE HCL 12.5 MG/5ML PO ELIX: 12.5000 mg | ORAL_SOLUTION | Freq: Four times a day (QID) | ORAL | Status: DC | PRN

## 2013-03-29 MED ORDER — ACETAMINOPHEN 650 MG RE SUPP
650.0000 mg | Freq: Four times a day (QID) | RECTAL | Status: DC | PRN
Start: 2013-03-29 — End: 2013-03-30

## 2013-03-29 MED ORDER — ACETAMINOPHEN 325 MG PO TABS: 650.0000 mg | ORAL_TABLET | Freq: Four times a day (QID) | ORAL | Status: DC | PRN

## 2013-03-29 MED ORDER — SODIUM CHLORIDE 0.9 % IV SOLN
INTRAVENOUS | Status: DC
  Administered 2013-03-29: 14:00:00 via INTRAVENOUS

## 2013-03-29 MED ORDER — HEPARIN SODIUM (PORCINE) 5000 UNIT/ML IJ SOLN
5000.0000 [IU] | Freq: Three times a day (TID) | INTRAMUSCULAR | Status: DC
  Administered 2013-03-29 – 2013-04-03 (×13): 5000 [IU] via SUBCUTANEOUS
  Filled 2013-03-29 (×18): qty 1

## 2013-03-29 MED ORDER — IOHEXOL 300 MG/ML  SOLN
50.0000 mL | Freq: Once | INTRAMUSCULAR | Status: AC | PRN
  Administered 2013-03-29: 50 mL via ORAL

## 2013-03-29 MED ORDER — CARVEDILOL 6.25 MG PO TABS
6.2500 mg | ORAL_TABLET | Freq: Two times a day (BID) | ORAL | Status: DC
  Administered 2013-03-30 – 2013-04-03 (×8): 6.25 mg via ORAL
  Filled 2013-03-29 (×11): qty 1

## 2013-03-29 MED ORDER — KCL IN DEXTROSE-NACL 20-5-0.9 MEQ/L-%-% IV SOLN
INTRAVENOUS | Status: DC
  Administered 2013-03-29 – 2013-04-02 (×6): via INTRAVENOUS
  Administered 2013-04-02: 75 mL/h via INTRAVENOUS
  Filled 2013-03-29 (×13): qty 1000

## 2013-03-29 MED ORDER — SODIUM CHLORIDE 0.9 % IV SOLN
1.0000 g | Freq: Every day | INTRAVENOUS | Status: DC
  Administered 2013-03-29: 1 g via INTRAVENOUS
  Filled 2013-03-29: qty 1

## 2013-03-29 MED ORDER — HYDROMORPHONE HCL PF 1 MG/ML IJ SOLN
1.0000 mg | INTRAMUSCULAR | Status: DC | PRN
  Administered 2013-03-29: 1 mg via INTRAVENOUS
  Administered 2013-03-29: 1.5 mg via INTRAVENOUS
  Administered 2013-03-30: 1 mg via INTRAVENOUS
  Administered 2013-03-30 (×3): 1.5 mg via INTRAVENOUS
  Filled 2013-03-29 (×2): qty 2
  Filled 2013-03-29: qty 1
  Filled 2013-03-29 (×3): qty 2

## 2013-03-29 MED ORDER — ERTAPENEM SODIUM 1 G IJ SOLR
1.0000 g | INTRAMUSCULAR | Status: DC
  Administered 2013-03-30 – 2013-04-02 (×4): 1 g via INTRAVENOUS
  Filled 2013-03-29 (×5): qty 1

## 2013-03-29 MED ORDER — LAMOTRIGINE 150 MG PO TABS
150.0000 mg | ORAL_TABLET | Freq: Two times a day (BID) | ORAL | Status: DC
  Administered 2013-03-29 – 2013-03-31 (×5): 150 mg via ORAL
  Filled 2013-03-29 (×8): qty 1

## 2013-03-29 MED ORDER — DIPHENHYDRAMINE HCL 50 MG/ML IJ SOLN: 12.5000 mg | Freq: Four times a day (QID) | INTRAMUSCULAR | Status: DC | PRN

## 2013-03-29 MED ORDER — PANTOPRAZOLE SODIUM 40 MG IV SOLR
40.0000 mg | Freq: Every day | INTRAVENOUS | Status: DC
  Administered 2013-03-29: 40 mg via INTRAVENOUS
  Filled 2013-03-29 (×3): qty 40

## 2013-03-29 MED ORDER — AMLODIPINE BESYLATE 5 MG PO TABS
5.0000 mg | ORAL_TABLET | Freq: Every day | ORAL | Status: DC
  Administered 2013-03-30 – 2013-04-02 (×4): 5 mg via ORAL
  Filled 2013-03-29 (×5): qty 1

## 2013-03-29 MED ORDER — HYDROMORPHONE HCL PF 2 MG/ML IJ SOLN
1.5000 mg | Freq: Once | INTRAMUSCULAR | Status: AC
  Administered 2013-03-29: 1.5 mg via INTRAVENOUS
  Filled 2013-03-29: qty 1

## 2013-03-29 MED ORDER — ONDANSETRON HCL 4 MG/2ML IJ SOLN: 4.0000 mg | Freq: Four times a day (QID) | INTRAMUSCULAR | Status: DC | PRN

## 2013-03-29 MED ORDER — LORAZEPAM 0.5 MG PO TABS
0.5000 mg | ORAL_TABLET | Freq: Three times a day (TID) | ORAL | Status: DC | PRN
  Administered 2013-03-29 – 2013-03-30 (×2): 0.5 mg via ORAL
  Filled 2013-03-29 (×2): qty 1

## 2013-03-29 MED ORDER — ONDANSETRON HCL 4 MG/2ML IJ SOLN
4.0000 mg | Freq: Once | INTRAMUSCULAR | Status: AC
  Administered 2013-03-29: 4 mg via INTRAVENOUS
  Filled 2013-03-29: qty 2

## 2013-03-29 MED ORDER — METHYLPHENIDATE HCL 10 MG PO TABS: 20.0000 mg | ORAL_TABLET | Freq: Two times a day (BID) | ORAL | Status: DC

## 2013-03-29 MED ORDER — RAMIPRIL 10 MG PO CAPS
10.0000 mg | ORAL_CAPSULE | Freq: Every day | ORAL | Status: DC
  Administered 2013-03-30 – 2013-04-02 (×4): 10 mg via ORAL
  Filled 2013-03-29 (×5): qty 1

## 2013-03-29 NOTE — H&P (Signed)
General surgery attending note:  I have personally interviewed and examined this patient. I reviewed his past history and review the CT scan findings. He does has significant localized inflammation of the sigmoid colon with a few bubbles of air around the sigmoid colon and a few bubbles of air over the left lobe of the liver. Examination is consistent with localized inflammatory findings. Does not have diffuse peritonitis or any signs of free perforation.  Last colonoscopy 18 months ago.  Hopefully he will respond to IV antibiotics, hydration and bowel rest and avoid an emergent Hartmann resection at this time.   Dakota Vang. Dalbert Batman, M.D., [facility] General and Minimally invasive Surgery Breast and Colorectal Surgery Office:   [phone] Pager:   [phone]

## 2013-03-29 NOTE — Telephone Encounter (Signed)
Pt called back today to notify us that he was going to the ER b/c he was still having pain. I got the message from triage that the pt had called while I was at lunch to state that he was going to tthe ER. I never did speak to the pt last week when I left him a message to call me back while I was working with Dr Johney Maine. I will notify Dr Johney Maine.

## 2013-03-29 NOTE — ED Notes (Signed)
Pt presents with pain to lower stomach . Pt had HX of perforated bowel in July 2014. Pt started with pain to upper stomach and began with  Cipro on Sunday 03/21/2013 per Dr. Johney Maine and stopped 01/13/015 when he felt better. Pain started back last night. Pt also has HX of diverticulitis and states he ate peanuts 2 weeks ago and pain began then as well.

## 2013-03-29 NOTE — Progress Notes (Signed)
Utilization Review completed.  Librado Guandique RN CM  

## 2013-03-29 NOTE — ED Provider Notes (Signed)
CSN: [id number]     Arrival date & time 03/29/13  1154 History   First MD Initiated Contact with Patient 03/29/13 1308     Chief Complaint  Patient presents with  . Abdominal Pain    x 1 week  . Nausea  . Diarrhea   (Consider location/radiation/quality/duration/timing/severity/associated sxs/prior Treatment) HPI Comments: Patient presents to the ER for evaluation of lower abdominal pain. Patient reports that the symptoms began approximately one week ago. He has a history of diverticulitis. He took Cipro that was prescribed to him by his surgeon for 3 days. He reports improvement of the pain, but then stopped the antibiotic and now has had progressive worsening of the pain. The pain is in the lower abdomen diffusely, constant and progressively worsening, now severe. He also reports that the pain worsens when he urinates. He has had low-grade fever.   Patient is a 48 y.o. male presenting with abdominal pain and diarrhea.  Abdominal Pain Associated symptoms: diarrhea and fever   Diarrhea Associated symptoms: abdominal pain and fever     Past Medical History  Diagnosis Date  . GERD (gastroesophageal reflux disease)   . Colon polyps 2008    Dr Fuller Plan  . Diverticulosis 2002    Dr Velora Heckler  . Hyperlipidemia   . Diverticulitis     PMH of  . ADD (attention deficit disorder with hyperactivity)     Dr. Donalee Citrin  . Anxiety     Dr. Donalee Citrin  . Hypertension   . Diverticulitis of sigmoid colon 06/24/2008    2005 by CT scan    . Pancolonic diverticulosis 09/09/2012  . COLONIC POLYPS, HX OF 11/20/2007    Qualifier: Diagnosis of  By: Linna Darner MD, Gwyndolyn Saxon   Last colonoscopy negative 2013 PMH of plyps X 2; Dr Fuller Plan, GI    . Duodenal diverticulum 08/28/2012   Past Surgical History  Procedure Laterality Date  . Tonsillectomy    . Lasik      bilaterally  . Colonoscopy w/ polypectomy  2002 & 2008    Dr. Fuller Plan; polyps X 2  . Colonoscopy  2013    Dr Fuller Plan  . Laparoscopic partial colectomy N/A 09/24/2012     Procedure: laparoscopic assisted  resection of proximal jejunum containing the jejunal diverticulum;  Surgeon: [name], MD;  Location: [facility];  Service: General;  Laterality: N/A;   Family History  Problem Relation Age of Onset  . Colon cancer Father     in 90s  . Lung cancer Father     smoker; asbestos exposure  . Cancer Father     Mesothelioma  . Coronary artery disease Mother     CAGB X 2 & angio 3-4X  . Cancer Mother     Gallbladder  . Coronary artery disease Maternal Grandmother   . COPD Maternal Grandmother   . Heart attack Maternal Grandfather     late 60's  . Heart attack Paternal Grandfather 57  . Diabetes Neg Hx   . Stroke Neg Hx    History  Substance Use Topics  . Smoking status: Current Some Day Smoker    Types: Cigarettes  . Smokeless tobacco: Never Used     Comment: 1 cigar / month; previously up to 1/2 ppd for 5 years  . Alcohol Use: 3.6 - 4.8 oz/week    6-8 Cans of beer per week     Comment: Beer on weekend     Review of Systems  Constitutional: Positive for fever.  Gastrointestinal: Positive for  abdominal pain and diarrhea.  All other systems reviewed and are negative.    Allergies  Penicillins  Home Medications   Current Outpatient Rx  Name  Route  Sig  Dispense  Refill  . amLODipine (NORVASC) 5 MG tablet      Take 1 tablet (5 mg total)  by mouth daily.   90 tablet   0   . aspirin EC 81 MG tablet   Oral   Take 81 mg by mouth daily.         Marland Kitchen buPROPion (WELLBUTRIN XL) 300 MG 24 hr tablet   Oral   Take 1 tablet (300 mg total) by mouth daily with breakfast.   90 tablet   0   . carvedilol (COREG) 6.25 MG tablet   Oral   Take 1 tablet (6.25 mg total) by mouth 2 (two) times daily with a meal. 1 by mouth twice daily   180 tablet   1   . lamoTRIgine (LAMICTAL) 150 MG tablet      Take 1 tablet by mouth  twice a day   180 tablet   0   . methylphenidate (RITALIN) 20 MG tablet      Take one in AM and one at NOON   60  tablet   0   . omeprazole (PRILOSEC) 20 MG capsule      Take 1 capsule by mouth  daily   90 capsule   1   . ramipril (ALTACE) 10 MG capsule      Take 1 capsule (10 mg  total) by mouth daily.   90 capsule   1   . rosuvastatin (CRESTOR) 20 MG tablet   Oral   Take 20 mg by mouth 4 (four) times a week. Take on Sunday Monday Wednesday and Friday.          BP 120/74  Pulse 105  Temp(Src) 100.5 F (38.1 C) (Oral)  Resp 18  Ht 6\' 1"  (1.854 m)  Wt 210 lb (95.255 kg)  BMI 27.71 kg/m2  SpO2 97% Physical Exam  Constitutional: He is oriented to person, place, and time. He appears well-developed and well-nourished. No distress.  HENT:  Head: Normocephalic and atraumatic.  Right Ear: Hearing normal.  Left Ear: Hearing normal.  Nose: Nose normal.  Mouth/Throat: Oropharynx is clear and moist and mucous membranes are normal.  Eyes: Conjunctivae and EOM are normal. Pupils are equal, round, and reactive to light.  Neck: Normal range of motion. Neck supple.  Cardiovascular: Regular rhythm, S1 normal and S2 normal.  Exam reveals no gallop and no friction rub.   No murmur heard. Pulmonary/Chest: Effort normal and breath sounds normal. No respiratory distress. He exhibits no tenderness.  Abdominal: Soft. Normal appearance and bowel sounds are normal. There is no hepatosplenomegaly. There is tenderness in the right lower quadrant, suprapubic area and left lower quadrant. There is guarding. There is no rebound, no tenderness at McBurney's point and negative Murphy's sign. No hernia.  Musculoskeletal: Normal range of motion.  Neurological: He is alert and oriented to person, place, and time. He has normal strength. No cranial nerve deficit or sensory deficit. Coordination normal. GCS eye subscore is 4. GCS verbal subscore is 5. GCS motor subscore is 6.  Skin: Skin is warm, dry and intact. No rash noted. No cyanosis.  Psychiatric: He has a normal mood and affect. His speech is normal and behavior  is normal. Thought content normal.    ED Course  Procedures (including critical care  time) Labs Review Labs Reviewed  CBC WITH DIFFERENTIAL - Abnormal; Notable for the following:    WBC 20.7 (*)    Neutrophils Relative % 86 (*)    Neutro Abs 17.9 (*)    Lymphocytes Relative 7 (*)    Monocytes Absolute 1.3 (*)    All other components within normal limits  COMPREHENSIVE METABOLIC PANEL - Abnormal; Notable for the following:    Glucose, Bld 107 (*)    All other components within normal limits  LIPASE, BLOOD  URINALYSIS, ROUTINE W REFLEX MICROSCOPIC   Imaging Review Ct Abdomen Pelvis W Contrast  03/29/2013   CLINICAL DATA:  Lower abdominal and pelvic pain.  Diverticulitis.  EXAM: CT ABDOMEN AND PELVIS WITH CONTRAST  TECHNIQUE: Multidetector CT imaging of the abdomen and pelvis was performed using the standard protocol following bolus administration of intravenous contrast.  CONTRAST:  161mL OMNIPAQUE IOHEXOL 300 MG/ML  SOLN  COMPARISON:  09/07/2012  FINDINGS: The abdominal parenchymal organs are normal in appearance. No evidence hydronephrosis. No soft tissue masses or lymphadenopathy identified within the abdomen or pelvis.  Moderate diverticulitis is seen involving the mid sigmoid colon. A tiny amount of extraluminal air is seen in sigmoid mesocolon. A tiny amount of free intraperitoneal air is seen along the anterior margin of the left hepatic lobe. No evidence of abscess or free fluid. No evidence of bowel obstruction. Normal appendix is visualized.  IMPRESSION: Moderate sigmoid diverticulitis, with tiny amount of free intraperitoneal air noted consistent with intraperitoneal perforation.  No evidence of abscess or bowel obstruction.  Critical Value/emergent results were called by telephone at the time of interpretation on 03/29/2013 at 3:23 PM to Dr. Joseph Berkshire , who verbally acknowledged these results.   Electronically Signed   By: Earle Gell M.D.   On: 03/29/2013 15:26    EKG  Interpretation   None       MDM  Diagnosis: Perforated diverticulitis  The patient presents to the ER for evaluation of abdominal pain for more than a week. He has a history of recurrent diverticulitis. The patient had low-grade fever, significant pain. He had a significant leukocytosis. CT scan was performed to evaluate for complicated diverticulitis as well as to rule out appendicitis, as patient's pain tenderness is maximum in the right lower quadrant.  CT scan shows evidence of mid sigmoid diverticulitis with free intraperitoneal air. General surgery consult in for evaluation and further treatment. Initiated on [place].    Orpah Greek, MD 03/29/13 [phone]

## 2013-03-29 NOTE — H&P (Addendum)
Dakota Vang is an 48 y.o. male.  PCP:  Unice Cobble, MD  GI:  Dr. Fuller Plan Psychiatry:  DR. Berniece Andreas General surgery:  Dr. Dakotah Boston  Chief Complaint: abdominal pain and nausea HPI: Pt is a 48 y/o with recurrent diverticulitis and S/p laparoscopic assisted resection of the proximal jejunum on 09/24/12, by Dr. Johney Maine.  He did well till about a week ago when he started having pain.  He took some Cipro Dr. Johney Maine had given him, (11/11-11/13/15,)  after his last discharge.  He felt better after 3 days till last PM he woke up with pain.  He had some constipation again before this all restarted, followed by some diarrhea, it seems this was better, he had a normal BM.  He complains of dull aching persistent pain mid lower abdomen.  Pain medicine isn't working, he has fever up to 100.5, and WBC up to 20,000. CT scan shows:  Moderate diverticulitis is seen involving the mid sigmoid colon. A tiny amount of extraluminal air is seen in sigmoid mesocolon. A tiny amount of free intraperitoneal air is seen along the anterior margin of the left hepatic lobe. No evidence of abscess or free fluid. No evidence of bowel obstruction. Normal appendix is visualized. We were ask to see.    Past Medical History  Diagnosis Date  Hypertension     ADD with hyperactivity/Mood disorder/Anxiety  Dr. Donalee Citrin   Pancolonic Diverticulosis/Diverticulitis 2002   Dr Velora Heckler     COLONIC POLYPS, HX OF   Qualifier: Diagnosis of  By: Linna Darner MD, Gwyndolyn Saxon   Last colonoscopy negative 2013 PMH of plyps X 2; Dr Fuller Plan, GI    Duodenal diverticulum     GERD (gastroesophageal reflux disease)   Colon polyps 2008   Dr Fuller Plan  Hyperlipidemia     Past Surgical History  Procedure Laterality Date  . Tonsillectomy    . Lasik      bilaterally  . Colonoscopy w/ polypectomy  2002 & 2008    Dr. Fuller Plan; polyps X 2  . Colonoscopy  2013    Dr Fuller Plan  . Laparoscopic partial colectomy N/A 09/24/2012    Procedure: laparoscopic assisted   resection of proximal jejunum containing the jejunal diverticulum;  Surgeon: [name], MD;  Location: [facility];  Service: General;  Laterality: N/A;    Family History  Problem Relation Age of Onset  . Colon cancer Father     in 30s  . Lung cancer Father     smoker; asbestos exposure  . Cancer Father     Mesothelioma  . Coronary artery disease Mother     CAGB X 2 & angio 3-4X  . Cancer Mother     Gallbladder  . Coronary artery disease Maternal Grandmother   . COPD Maternal Grandmother   . Heart attack Maternal Grandfather     late 60's  . Heart attack Paternal Grandfather 38  . Diabetes Neg Hx   . Stroke Neg Hx    Social History:   Tobacco;  Occasional 20+ years on and off Drugs:  None Etoh:  Probation officer Married;  3 sons. Prior to Admission medications   Medication Sig Start Date End Date Taking? Authorizing Provider  amLODipine (NORVASC) 5 MG tablet Take 1 tablet (5 mg total)  by mouth daily. 03/01/13  Yes Hendricks Limes, MD  aspirin EC 81 MG tablet Take 81 mg by mouth daily.   Yes Historical Provider, MD  buPROPion (WELLBUTRIN XL) 300  MG 24 hr tablet Take 1 tablet (300 mg total) by mouth daily with breakfast. 03/15/13  Yes Kathlee Nations, MD  carvedilol (COREG) 6.25 MG tablet Take 1 tablet (6.25 mg total) by mouth 2 (two) times daily with a meal. 1 by mouth twice daily 08/24/12  Yes Hendricks Limes, MD  lamoTRIgine (LAMICTAL) 150 MG tablet Take 1 tablet by mouth  twice a day 03/15/13  Yes Kathlee Nations, MD  methylphenidate (RITALIN) 20 MG tablet Take one in AM and one at [facility] 03/15/13  Yes Kathlee Nations, MD  omeprazole (PRILOSEC) 20 MG capsule Take 1 capsule by mouth  daily 10/11/12  Yes Hendricks Limes, MD  ramipril (ALTACE) 10 MG capsule Take 1 capsule (10 mg  total) by mouth daily. 10/11/12  Yes Hendricks Limes, MD  rosuvastatin (CRESTOR) 20 MG tablet Take 20 mg by mouth 4 (four) times a week. Take on Sunday Monday Wednesday and Friday.   Yes  Historical Provider, MD    Allergies:  Allergies  Allergen Reactions  . Penicillins Other (See Comments)    ? Reaction @ 48 yrs old     (Not in a hospital admission)  Results for orders placed during the hospital encounter of 03/29/13 (from the past 48 hour(s))  URINALYSIS, ROUTINE W REFLEX MICROSCOPIC     Status: None   Collection Time    03/29/13  1:37 PM      Result Value Range   Color, Urine YELLOW  YELLOW   APPearance CLEAR  CLEAR   Specific Gravity, Urine 1.021  1.005 - 1.030   pH 5.5  5.0 - 8.0   Glucose, UA NEGATIVE  NEGATIVE mg/dL   Hgb urine dipstick NEGATIVE  NEGATIVE   Bilirubin Urine NEGATIVE  NEGATIVE   Ketones, ur NEGATIVE  NEGATIVE mg/dL   Protein, ur NEGATIVE  NEGATIVE mg/dL   Urobilinogen, UA 0.2  0.0 - 1.0 mg/dL   Nitrite NEGATIVE  NEGATIVE   Leukocytes, UA NEGATIVE  NEGATIVE   Comment: MICROSCOPIC NOT DONE ON URINES WITH NEGATIVE PROTEIN, BLOOD, LEUKOCYTES, NITRITE, OR GLUCOSE <1000 mg/dL.  CBC WITH DIFFERENTIAL     Status: Abnormal   Collection Time    03/29/13  1:50 PM      Result Value Range   WBC 20.7 (*) 4.0 - 10.5 K/uL   RBC 5.06  4.22 - 5.81 MIL/uL   Hemoglobin 15.2  13.0 - 17.0 g/dL   HCT 43.3  39.0 - 52.0 %   MCV 85.6  78.0 - 100.0 fL   MCH 30.0  26.0 - 34.0 pg   MCHC 35.1  30.0 - 36.0 g/dL   RDW 12.5  11.5 - 15.5 %   Platelets 230  150 - 400 K/uL   Neutrophils Relative % 86 (*) 43 - 77 %   Neutro Abs 17.9 (*) 1.7 - 7.7 K/uL   Lymphocytes Relative 7 (*) 12 - 46 %   Lymphs Abs 1.5  0.7 - 4.0 K/uL   Monocytes Relative 6  3 - 12 %   Monocytes Absolute 1.3 (*) 0.1 - 1.0 K/uL   Eosinophils Relative 0  0 - 5 %   Eosinophils Absolute 0.0  0.0 - 0.7 K/uL   Basophils Relative 0  0 - 1 %   Basophils Absolute 0.1  0.0 - 0.1 K/uL  COMPREHENSIVE METABOLIC PANEL     Status: Abnormal   Collection Time    03/29/13  1:50 PM      Result  Value Range   Sodium 137  137 - 147 mEq/L   Potassium 4.2  3.7 - 5.3 mEq/L   Chloride 100  96 - 112 mEq/L   CO2  22  19 - 32 mEq/L   Glucose, Bld 107 (*) 70 - 99 mg/dL   BUN 14  6 - 23 mg/dL   Creatinine, Ser 0.77  0.50 - 1.35 mg/dL   Calcium 9.5  8.4 - 10.5 mg/dL   Total Protein 7.2  6.0 - 8.3 g/dL   Albumin 4.2  3.5 - 5.2 g/dL   AST 14  0 - 37 U/L   ALT 20  0 - 53 U/L   Alkaline Phosphatase 87  39 - 117 U/L   Total Bilirubin 0.3  0.3 - 1.2 mg/dL   GFR calc non Af Amer >90  >90 mL/min   GFR calc Af Amer >90  >90 mL/min   Comment: (NOTE)     The eGFR has been calculated using the CKD EPI equation.     This calculation has not been validated in all clinical situations.     eGFR's persistently <90 mL/min signify possible Chronic Kidney     Disease.  LIPASE, BLOOD     Status: None   Collection Time    03/29/13  1:50 PM      Result Value Range   Lipase 43  11 - 59 U/L   Ct Abdomen Pelvis W Contrast  03/29/2013   CLINICAL DATA:  Lower abdominal and pelvic pain.  Diverticulitis.  EXAM: CT ABDOMEN AND PELVIS WITH CONTRAST  TECHNIQUE: Multidetector CT imaging of the abdomen and pelvis was performed using the standard protocol following bolus administration of intravenous contrast.  CONTRAST:  170m OMNIPAQUE IOHEXOL 300 MG/ML  SOLN  COMPARISON:  09/07/2012  FINDINGS: The abdominal parenchymal organs are normal in appearance. No evidence hydronephrosis. No soft tissue masses or lymphadenopathy identified within the abdomen or pelvis.  Moderate diverticulitis is seen involving the mid sigmoid colon. A tiny amount of extraluminal air is seen in sigmoid mesocolon. A tiny amount of free intraperitoneal air is seen along the anterior margin of the left hepatic lobe. No evidence of abscess or free fluid. No evidence of bowel obstruction. Normal appendix is visualized.  IMPRESSION: Moderate sigmoid diverticulitis, with tiny amount of free intraperitoneal air noted consistent with intraperitoneal perforation.  No evidence of abscess or bowel obstruction.  Critical Value/emergent results were called by telephone at the  time of interpretation on 03/29/2013 at 3:23 PM to Dr. CJoseph Berkshire, who verbally acknowledged these results.   Electronically Signed   By: JEarle GellM.D.   On: 03/29/2013 15:26    Review of Systems  Constitutional: Positive for fever (100.5 this Am in ER), weight loss (he has lost about 30 pounds since surgery in July 2014.) and malaise/fatigue. Negative for chills.  HENT: Negative.   Eyes: Negative.   Respiratory: Negative.   Cardiovascular: Negative.   Gastrointestinal: Positive for heartburn, nausea, abdominal pain (mid abominal pain), diarrhea and constipation. Negative for vomiting, blood in stool and melena.       Occasional hemorrhoids.  Genitourinary: Positive for dysuria.       Burning on urination  Musculoskeletal: Positive for back pain (some back pain, back goes out once a year.).  Skin: Negative.   Neurological: Negative.   Endo/Heme/Allergies: Positive for environmental allergies. Negative for polydipsia. Does not bruise/bleed easily.  Psychiatric/Behavioral: Positive for depression. The patient is nervous/anxious.     Blood  pressure 154/84, pulse 73, temperature 100.5 F (38.1 C), temperature source Oral, resp. rate 18, height 6' 1"  (1.854 m), weight 95.255 kg (210 lb), SpO2 100.00%. Physical Exam  Constitutional: He is oriented to person, place, and time. He appears well-developed and well-nourished. No distress.  He has a fever now, it's reading 99.5 oral but it's feels higher than that right now.  HENT:  Head: Normocephalic and atraumatic.  Nose: Nose normal.  Eyes: Conjunctivae and EOM are normal. Pupils are equal, round, and reactive to light. Right eye exhibits no discharge. Left eye exhibits no discharge. No scleral icterus.  Neck: Normal range of motion. Neck supple. No JVD present. No tracheal deviation present. No thyromegaly present.  Cardiovascular: Normal rate, regular rhythm, normal heart sounds and intact distal pulses.   No murmur  heard. Respiratory: Effort normal and breath sounds normal. No respiratory distress. He has no wheezes. He has no rales. He exhibits no tenderness.  GI: Soft. Bowel sounds are normal. He exhibits no distension and no mass. There is tenderness (mid lower abdomen). There is no rebound and no guarding.  Musculoskeletal: He exhibits no edema and no tenderness.  Lymphadenopathy:    He has no cervical adenopathy.  Neurological: He is alert and oriented to person, place, and time. A cranial nerve deficit is present.  Skin: Skin is warm and dry. No rash noted. He is not diaphoretic. No erythema. No pallor.  Psychiatric: He has a normal mood and affect. Judgment normal.  He is extremely anxious and upset.     Assessment/Plan 1.  Sigmoid Diverticulitis with microperforation 2.  S/P  laparoscopic assisted resection of the proximal jejunum on 09/24/13, by Dr. Johney Maine 3.  Pancolonic diverticulosis 4.  Hypertension 5.  ADD hyperactive/Mood disorder/Anxiety 6. GERD 7.  Hx of recurrent constipation 8.  Hyperlipidemia 9.  Body mass index is 27.7  Wife reports 30 pound weight loss since July.  Plan:  We will admit, keep him on his home medicines with sips and ice chips.  Hydrate with IV fluids, bowel rest, and IV antibiotics.  I will let Dr. Johney Maine know he's in the hospital.   Earnstine Regal 03/29/2013, 4:49 PM

## 2013-03-30 ENCOUNTER — Encounter (HOSPITAL_COMMUNITY): Payer: Self-pay | Admitting: Surgery

## 2013-03-30 LAB — CBC
HCT: 36.5 % — ABNORMAL LOW (ref 39.0–52.0)
Hemoglobin: 12.9 g/dL — ABNORMAL LOW (ref 13.0–17.0)
MCH: 30.8 pg (ref 26.0–34.0)
MCHC: 35.3 g/dL (ref 30.0–36.0)
MCV: 87.1 fL (ref 78.0–100.0)
PLATELETS: 207 10*3/uL (ref 150–400)
RBC: 4.19 MIL/uL — AB (ref 4.22–5.81)
RDW: 13 % (ref 11.5–15.5)
WBC: 16.9 10*3/uL — AB (ref 4.0–10.5)

## 2013-03-30 LAB — BASIC METABOLIC PANEL
BUN: 11 mg/dL (ref 6–23)
CALCIUM: 8.7 mg/dL (ref 8.4–10.5)
CO2: 26 meq/L (ref 19–32)
CREATININE: 0.94 mg/dL (ref 0.50–1.35)
Chloride: 100 mEq/L (ref 96–112)
GFR calc Af Amer: >90 mL/min (ref >90)
Glucose, Bld: 118 mg/dL — ABNORMAL HIGH (ref 70–99)
Potassium: 4.2 mEq/L (ref 3.7–5.3)
SODIUM: 136 meq/L — AB (ref 137–147)

## 2013-03-30 MED ORDER — METHYLPHENIDATE HCL 10 MG PO TABS: 20.0000 mg | ORAL_TABLET | Freq: Two times a day (BID) | ORAL | Status: DC

## 2013-03-30 MED ORDER — LACTATED RINGERS IV BOLUS (SEPSIS)
1000.0000 mL | Freq: Once | INTRAVENOUS | Status: AC
  Administered 2013-03-30: 1000 mL via INTRAVENOUS

## 2013-03-30 MED ORDER — MENTHOL 3 MG MT LOZG: 1.0000 | LOZENGE | OROMUCOSAL | Status: DC | PRN

## 2013-03-30 MED ORDER — METHYLPHENIDATE HCL 5 MG PO TABS
20.0000 mg | ORAL_TABLET | Freq: Two times a day (BID) | ORAL | Status: DC
  Administered 2013-03-30 – 2013-04-03 (×8): 20 mg via ORAL
  Filled 2013-03-30 (×8): qty 4

## 2013-03-30 MED ORDER — ALUM & MAG HYDROXIDE-SIMETH 200-200-20 MG/5ML PO SUSP: 30.0000 mL | Freq: Four times a day (QID) | ORAL | Status: DC | PRN

## 2013-03-30 MED ORDER — MAGIC MOUTHWASH
15.0000 mL | Freq: Four times a day (QID) | ORAL | Status: DC | PRN
  Filled 2013-03-30: qty 15

## 2013-03-30 MED ORDER — PHENOL 1.4 % MT LIQD: 2.0000 | OROMUCOSAL | Status: DC | PRN

## 2013-03-30 MED ORDER — METOPROLOL TARTRATE 1 MG/ML IV SOLN
5.0000 mg | Freq: Four times a day (QID) | INTRAVENOUS | Status: DC | PRN
  Filled 2013-03-30: qty 5

## 2013-03-30 MED ORDER — LACTATED RINGERS IV BOLUS (SEPSIS): 1000.0000 mL | Freq: Three times a day (TID) | INTRAVENOUS | Status: AC | PRN

## 2013-03-30 MED ORDER — HYDROMORPHONE HCL PF 1 MG/ML IJ SOLN
1.0000 mg | INTRAMUSCULAR | Status: DC | PRN
  Administered 2013-03-30 (×6): 2 mg via INTRAVENOUS
  Administered 2013-03-31 (×2): 1 mg via INTRAVENOUS
  Administered 2013-03-31 (×2): 2 mg via INTRAVENOUS
  Administered 2013-03-31 (×2): 1 mg via INTRAVENOUS
  Administered 2013-03-31 (×3): 2 mg via INTRAVENOUS
  Administered 2013-03-31 – 2013-04-01 (×3): 1 mg via INTRAVENOUS
  Administered 2013-04-01: 0.5 mg via INTRAVENOUS
  Filled 2013-03-30: qty 2
  Filled 2013-03-30 (×4): qty 1
  Filled 2013-03-30 (×6): qty 2
  Filled 2013-03-30 (×3): qty 1
  Filled 2013-03-30 (×2): qty 2
  Filled 2013-03-30: qty 1
  Filled 2013-03-30 (×2): qty 2

## 2013-03-30 MED ORDER — ACETAMINOPHEN 500 MG PO TABS
1000.0000 mg | ORAL_TABLET | Freq: Three times a day (TID) | ORAL | Status: DC
  Administered 2013-03-30 – 2013-03-31 (×5): 1000 mg via ORAL
  Filled 2013-03-30 (×9): qty 2

## 2013-03-30 MED ORDER — SACCHAROMYCES BOULARDII 250 MG PO CAPS
250.0000 mg | ORAL_CAPSULE | Freq: Two times a day (BID) | ORAL | Status: DC
  Administered 2013-03-30 – 2013-04-02 (×8): 250 mg via ORAL
  Filled 2013-03-30 (×10): qty 1

## 2013-03-30 MED ORDER — ASPIRIN EC 81 MG PO TBEC
81.0000 mg | DELAYED_RELEASE_TABLET | Freq: Every day | ORAL | Status: DC
  Administered 2013-03-30 – 2013-04-02 (×4): 81 mg via ORAL
  Filled 2013-03-30 (×5): qty 1

## 2013-03-30 MED ORDER — PROMETHAZINE HCL 25 MG/ML IJ SOLN: 6.2500 mg | Freq: Four times a day (QID) | INTRAMUSCULAR | Status: DC | PRN

## 2013-03-30 MED ORDER — LIP MEDEX EX OINT
1.0000 "application " | TOPICAL_OINTMENT | Freq: Two times a day (BID) | CUTANEOUS | Status: DC
  Administered 2013-03-31 – 2013-04-02 (×2): 1 via TOPICAL
  Filled 2013-03-30 (×2): qty 7

## 2013-03-30 MED ORDER — PANTOPRAZOLE SODIUM 40 MG PO TBEC
40.0000 mg | DELAYED_RELEASE_TABLET | Freq: Every day | ORAL | Status: DC
  Administered 2013-03-30 – 2013-04-02 (×4): 40 mg via ORAL
  Filled 2013-03-30 (×5): qty 1

## 2013-03-30 MED ORDER — LORAZEPAM 2 MG/ML IJ SOLN: 0.5000 mg | Freq: Three times a day (TID) | INTRAMUSCULAR | Status: DC | PRN

## 2013-03-30 NOTE — Progress Notes (Signed)
Subjective: Alert and hemodynamically stable. Low-grade tachycardia, 105. Low-grade temp, 100.4. Denies chills. Denies nausea or vomiting. No stools. Passing minimal flatus. Still reports lower midline pain. Doesn't feel any worse than yesterday. Narcotics are helpful but he is using and routinely.  No shortness of breath. Voiding uneventfully. Urine clear. Abdomen seems adequate.  WBC 16,900, down a little bit. Hemoglobin 12.9. BUN 11, creatinine 0.94. Glucose 118. Potassium 4.3.  Objective: Vital signs in last 24 hours: Temp:  [99.1 F (37.3 C)-100.6 F (38.1 C)] 100.6 F (38.1 C) (01/20 0435) Pulse Rate:  [73-113] 108 (01/20 0435) Resp:  [16-20] 20 (01/20 0435) BP: (120-154)/(71-84) 130/79 mmHg (01/20 0435) SpO2:  [93 %-100 %] 95 % (01/20 0435) Weight:  [210 lb (95.255 kg)] 210 lb (95.255 kg) (01/19 1253)    Intake/Output from previous day: 01/19 0701 - 01/20 0700 In: 385 [I.V.:385] Out: 650 [Urine:650] Intake/Output this shift: Total I/O In: 0  Out: 400 [Urine:400]  General appearance: alert. Cooperative. Oriented. Mental status normal. Minimal distress. Resp: clear to auscultation bilaterally GI: tender with guarding lower midline. Upper abdomen soft and benign. Bowel sounds present. Does not appear distended.  Lab Results:   Recent Labs  03/29/13 1350 03/30/13 0434  WBC 20.7* 16.9*  HGB 15.2 12.9*  HCT 43.3 36.5*  PLT 230 207   BMET  Recent Labs  03/29/13 1350 03/30/13 0434  NA 137 136*  K 4.2 4.2  CL 100 100  CO2 22 26  GLUCOSE 107* 118*  BUN 14 11  CREATININE 0.77 0.94  CALCIUM 9.5 8.7   PT/INR No results found for this basename: LABPROT, INR,  in the last 72 hours ABG No results found for this basename: PHART, PCO2, PO2, HCO3,  in the last 72 hours  Studies/Results: Ct Abdomen Pelvis W Contrast  03/29/2013   CLINICAL DATA:  Lower abdominal and pelvic pain.  Diverticulitis.  EXAM: CT ABDOMEN AND PELVIS WITH CONTRAST  TECHNIQUE: Multidetector  CT imaging of the abdomen and pelvis was performed using the standard protocol following bolus administration of intravenous contrast.  CONTRAST:  160mL OMNIPAQUE IOHEXOL 300 MG/ML  SOLN  COMPARISON:  09/07/2012  FINDINGS: The abdominal parenchymal organs are normal in appearance. No evidence hydronephrosis. No soft tissue masses or lymphadenopathy identified within the abdomen or pelvis.  Moderate diverticulitis is seen involving the mid sigmoid colon. A tiny amount of extraluminal air is seen in sigmoid mesocolon. A tiny amount of free intraperitoneal air is seen along the anterior margin of the left hepatic lobe. No evidence of abscess or free fluid. No evidence of bowel obstruction. Normal appendix is visualized.  IMPRESSION: Moderate sigmoid diverticulitis, with tiny amount of free intraperitoneal air noted consistent with intraperitoneal perforation.  No evidence of abscess or bowel obstruction.  Critical Value/emergent results were called by telephone at the time of interpretation on 03/29/2013 at 3:23 PM to Dr. Joseph Berkshire , who verbally acknowledged these results.   Electronically Signed   By: Earle Gell M.D.   On: 03/29/2013 15:26    Anti-infectives: Anti-infectives   Start     Dose/Rate Route Frequency Ordered Stop   03/30/13 1700  ertapenem (INVANZ) 1 g in sodium chloride 0.9 % 50 mL IVPB     1 g 100 mL/hr over 30 Minutes Intravenous Every 24 hours 03/29/13 1730     03/29/13 1700  ertapenem (INVANZ) 1 g in sodium chloride 0.9 % 50 mL IVPB  Status:  Discontinued     1 g 100 mL/hr over  30 Minutes Intravenous Daily 03/29/13 1602 03/29/13 1740      Assessment/Plan:  Sigmoid diverticulitis with microperforation. Inflammatory process appears localized with no evidence of diffuse peritonitis or obstruction.  Low-grade tachycardia and low-grade fever noted. No indication for surgical intervention today, but I am not sure that he will not require resection this admission. I discussed this  with him. For today continue bowel rest, IV hydration, IV Invanz, ambulation. Check labs and reassess  tomorrow.  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  Hypertension-norvasc, altace, coreg  ADD hyperactive, mood disorder, anxiety - ritilin,ativan,wellbutrin  GERD.-protonix  DVT prophylaxis. We'll begin subcutaneous heparin today.   LOS: 1 day    Dakota Vang M 03/30/2013

## 2013-03-30 NOTE — Progress Notes (Signed)
INITIAL NUTRITION ASSESSMENT  DOCUMENTATION CODES Per approved criteria  -Not Applicable   INTERVENTION: - Diet advancement per MD - Educated pt and wife on diet therapy for diverticulitis and diverticulosis - Will continue to monitor   NUTRITION DIAGNOSIS: Inadequate oral intake related to inability to eat as evidenced by NPO.   Goal: Advance diet as tolerated to low fiber diet  Monitor:  Weights, labs, diet advancement  Reason for Assessment: Malnutrition screening tool   48 y.o. male  Admitting Dx: Diverticulitis of colon with perforation  ASSESSMENT: Pt with hx of recurrent diverticulitis and s/p laparoscopic assisted resection of the proximal jejunum on 09/24/12, by Dr. Johney Maine. He did well till about a week ago when he started having pain. He took some Cipro Dr. Johney Maine had given him, (11/11-11/13/15,) after his last discharge. He felt better after 3 days till last PM he woke up with pain. He had some constipation again before this all restarted, followed by some diarrhea, it seems this was better, he had a normal BM. He complains of dull aching persistent pain mid lower abdomen. Found to have diverticulitis with microperforation.   Met with pt and wife who report pt with good appetite PTA, eating 2 meals/day and a snack. Pt reports eating some peanut M&Ms in the past 2 weeks which he thinks contributed to his pain. Both report 30 pound weight loss since July 2014.    Height: Ht Readings from Last 1 Encounters:  03/29/13 6' 1"  (1.854 m)    Weight: Wt Readings from Last 1 Encounters:  03/29/13 210 lb (95.255 kg)    Ideal Body Weight: 184 lb   % Ideal Body Weight: 114%  Wt Readings from Last 10 Encounters:  03/29/13 210 lb (95.255 kg)  03/15/13 214 lb (97.07 kg)  12/09/12 214 lb (97.07 kg)  10/19/12 212 lb 9.6 oz (96.435 kg)  09/26/12 221 lb 12.8 oz (100.608 kg)  09/26/12 221 lb 12.8 oz (100.608 kg)  09/21/12 221 lb (100.245 kg)  09/09/12 216 lb 9.6 oz (98.249 kg)   09/07/12 222 lb (100.699 kg)  08/29/12 218 lb 9.6 oz (99.156 kg)    Usual Body Weight: 240 lb in July 2014 per pt  % Usual Body Weight: 87%  BMI:  Body mass index is 27.71 kg/(m^2).  Estimated Nutritional Needs: Kcal: 2100-2300 Protein: 100-120g Fluid: 2.1-2.3L/day  Skin: Intact   Diet Order: NPO  EDUCATION NEEDS: -Education needs addressed - educated pt on diet for diverticulitis and diverticulosis and provided handouts with RD contact information    Intake/Output Summary (Last 24 hours) at 03/30/13 1414 Last data filed at 03/30/13 1000  Gross per 24 hour  Intake 2270.42 ml  Output   1350 ml  Net 920.42 ml    Last BM: PTA  Labs:   Recent Labs Lab 03/29/13 1350 03/30/13 0434  NA 137 136*  K 4.2 4.2  CL 100 100  CO2 22 26  BUN 14 11  CREATININE 0.77 0.94  CALCIUM 9.5 8.7  GLUCOSE 107* 118*    CBG (last 3)  No results found for this basename: GLUCAP,  in the last 72 hours  Scheduled Meds: . acetaminophen  1,000 mg Oral TID  . amLODipine  5 mg Oral Daily  . aspirin EC  81 mg Oral Daily  . buPROPion  300 mg Oral Q breakfast  . carvedilol  6.25 mg Oral BID WC  . ertapenem (INVANZ) IV  1 g Intravenous Q24H  . heparin  5,000 Units Subcutaneous  Q8H  . lactated ringers  1,000 mL Intravenous Once  . lamoTRIgine  150 mg Oral BID  . lip balm  1 application Topical BID  . methylphenidate  20 mg Oral BID WC  . pantoprazole  40 mg Oral Q1200  . ramipril  10 mg Oral Daily  . saccharomyces boulardii  250 mg Oral BID    Continuous Infusions: . dextrose 5 % and 0.9 % NaCl with KCl 20 mEq/L 125 mL/hr at 03/30/13 1048    Past Medical History  Diagnosis Date  . GERD (gastroesophageal reflux disease)   . Colon polyps 2008    Dr Fuller Plan  . Diverticulosis 2002    Dr Velora Heckler  . Hyperlipidemia   . Diverticulitis     PMH of  . ADD (attention deficit disorder with hyperactivity)     Dr. Donalee Citrin  . Anxiety     Dr. Donalee Citrin  . Hypertension   . Diverticulitis of  sigmoid colon 06/24/2008    2005 by CT scan    . Pancolonic diverticulosis 09/09/2012  . COLONIC POLYPS, HX OF 11/20/2007    Qualifier: Diagnosis of  By: Linna Darner MD, Gwyndolyn Saxon   Last colonoscopy negative 2013 PMH of plyps X 2; Dr Fuller Plan, GI    . Duodenal diverticulum 08/28/2012  . Diverticulitis of jejunum with microperforation s/p lap resection [id number] 08/28/2012    Qualifier: Diagnosis of  By: Linna Darner MD, Gwyndolyn Saxon   6/1-3/14 [facility] : ? Jejunal perforation F/U CT recommended 08/24/12     Past Surgical History  Procedure Laterality Date  . Tonsillectomy    . Lasik      bilaterally  . Colonoscopy w/ polypectomy  2002 & 2008    Dr. Fuller Plan; polyps X 2  . Colonoscopy  2013    Dr Fuller Plan  . Laparoscopic partial colectomy N/A 09/24/2012    Procedure: laparoscopic assisted  resection of proximal jejunum containing the jejunal diverticulum;  Surgeon: [name], MD;  Location: [facility];  Service: General;  Laterality: N/A;    Mikey College MS, [place], [place] Pager [phone] After Hours Pager

## 2013-03-31 ENCOUNTER — Encounter (HOSPITAL_COMMUNITY): Payer: Self-pay | Admitting: Surgery

## 2013-03-31 LAB — CBC
HEMATOCRIT: 36.2 % — AB (ref 39.0–52.0)
Hemoglobin: 12.1 g/dL — ABNORMAL LOW (ref 13.0–17.0)
MCH: 29.6 pg (ref 26.0–34.0)
MCHC: 33.4 g/dL (ref 30.0–36.0)
MCV: 88.5 fL (ref 78.0–100.0)
Platelets: 193 10*3/uL (ref 150–400)
RBC: 4.09 MIL/uL — ABNORMAL LOW (ref 4.22–5.81)
RDW: 12.9 % (ref 11.5–15.5)
WBC: 15.6 10*3/uL — ABNORMAL HIGH (ref 4.0–10.5)

## 2013-03-31 LAB — CREATININE, SERUM
Creatinine, Ser: 0.91 mg/dL (ref 0.50–1.35)
GFR calc Af Amer: >90 mL/min (ref >90)
GFR calc non Af Amer: >90 mL/min (ref >90)

## 2013-03-31 LAB — POTASSIUM: POTASSIUM: 4.1 meq/L (ref 3.7–5.3)

## 2013-03-31 NOTE — Progress Notes (Signed)
Dakota Vang [id number] March 08, 1966  CARE TEAM:  PCP: Unice Cobble, MD  Outpatient Care Team: Patient Care Team: Hendricks Limes, MD as PCP - General Ladene Artist, MD as Consulting Physician (Gastroenterology) Kathlee Nations, MD as Consulting Physician (Psychiatry) [name], MD as Consulting Physician (General Surgery)  Inpatient Treatment Team: Treatment Team: Attending Provider: Nolon Nations, MD; Registered Nurse: Hoyle Barr, RN; Technician: Coralie Carpen, NT; Consulting Physician: [name], MD; Dietitian: Christie Beckers, RD   Subjective:  Pt familiar to me from prior surgery for JEJUNAL diverticulitis Tx'd with IV ABx and delayed resection/anastomosis.  Started PO Cipro/Flagyl when felt pain 5 days ago.  Felt better. Called Fri - did not call back nor over weekend (I was on call) and then suddenly worse Sun night - ED - admitted  Pain decreasing  Walking in hallways  Wife in room   Objective:  Vital signs:  Filed Vitals:   03/30/13 2215 03/30/13 2300 03/31/13 0500 03/31/13 0614  BP: 122/82  125/78 139/66  Pulse: 111  106 67  Temp: 100.6 F (38.1 C) 98.9 F (37.2 C) 100 F (37.8 C) 98.1 F (36.7 C)  TempSrc: Axillary Oral Axillary Oral  Resp: 18  18 18   Height:      Weight:      SpO2: 95%  95% 98%    Last BM Date: 03/29/13  Intake/Output   Yesterday:  01/20 0701 - 01/21 0700 In: 4230 [P.O.:180; I.V.:3000; IV Piggyback:1050] Out: 2850 [Urine:2850] This shift:     Bowel function:  Flatus: n  BM: n  Drain: n/a  Physical Exam:  General: Pt awake/alert/oriented x4 in no acute distress Eyes: PERRL, normal EOM.  Sclera clear.  No icterus Neuro: CN II-XII intact w/o focal sensory/motor deficits. Lymph: No head/neck/groin lymphadenopathy Psych:  No delerium/psychosis/paranoia.  Remains inquisitive/questioning/bargaining HENT: Normocephalic, Mucus membranes moist.  No thrush Neck: Supple, No tracheal deviation Chest: No chest  wall pain w good excursion CV:  Pulses intact.  Regular rhythm MS: Normal AROM mjr joints.  No obvious deformity Abdomen: Soft.  Nondistended.  Mildly tender LLQ>suprapubic with minimal  focal peritonitis - IMPROVING.  Rest of abdomen nontender.  No incarcerated hernias. Ext:  SCDs BLE.  No mjr edema.  No cyanosis Skin: No petechiae / purpura   Problem List:   Principal Problem:   Diverticulitis of colon with perforation Active Problems:   ADD   HYPERTENSION   HEMORRHOIDS, INTERNAL   G E R D   COLONIC POLYPS, HX OF   Diverticulitis of jejunum with microperforation s/p lap resection [id number]   Pancolonic diverticulosis   Assessment  Dakota Vang Strong  48 y.o. male       Sigmoid diverticulitis w microperforation - IMPROVING so far  Plan:  IV ABx.  Invanz given  NPO x sips until pain way down & +flatus (prob tomorrow)  VTE prophylaxis- SCDs, etc  Continue ADHD meds  Control GERD  Mobilize as tolerated to help recovery  Try to cool down to avoid emergent surgery.  Again he is very concerned about needing ostomy.  I noted we do that in emergent situations to save someone's life.  Hopefully it can cool down & avoid that.    CT scan if does not improve/worsens to r/o abscess formation  Elective sigmoid colectomy eventually given complex episode w microperforation.  Wait 6 weeks from d/c once this attack is under control.  Last endoscopy late 2013 = no need for new  one.  Prob MIS approach (robotic/lap):  The anatomy & physiology of the digestive tract was discussed.  The pathophysiology was discussed.  Natural history risks without surgery was discussed.   I feel the risks of no intervention will lead to serious problems that outweigh the operative risks; therefore, I recommended a partial colectomy to remove the pathology.  Laparoscopic & open techniques were discussed.   Risks such as bleeding, infection, abscess, leak, reoperation, possible ostomy, hernia, heart attack,  death, and other risks were discussed.  I noted a good likelihood this will help address the problem.   Goals of post-operative recovery were discussed as well.  We will work to minimize complications.  An educational handout on the pathology was given as well.  Questions were answered.  The patient expresses understanding & wishes to proceed with surgery.   [name], M.D., F.A.C.S. Gastrointestinal and Minimally Invasive Surgery [facility] [address] [place], [place] [address] [phone] Main / Paging   03/31/2013   Results:   Labs: Results for orders placed during the hospital encounter of 03/29/13 (from the past 48 hour(s))  URINALYSIS, ROUTINE W REFLEX MICROSCOPIC     Status: None   Collection Time    03/29/13  1:37 PM      Result Value Range   Color, Urine YELLOW  YELLOW   APPearance CLEAR  CLEAR   Specific Gravity, Urine 1.021  1.005 - 1.030   pH 5.5  5.0 - 8.0   Glucose, UA NEGATIVE  NEGATIVE mg/dL   Hgb urine dipstick NEGATIVE  NEGATIVE   Bilirubin Urine NEGATIVE  NEGATIVE   Ketones, ur NEGATIVE  NEGATIVE mg/dL   Protein, ur NEGATIVE  NEGATIVE mg/dL   Urobilinogen, UA 0.2  0.0 - 1.0 mg/dL   Nitrite NEGATIVE  NEGATIVE   Leukocytes, UA NEGATIVE  NEGATIVE   Comment: MICROSCOPIC NOT DONE ON URINES WITH NEGATIVE PROTEIN, BLOOD, LEUKOCYTES, NITRITE, OR GLUCOSE <1000 mg/dL.  CBC WITH DIFFERENTIAL     Status: Abnormal   Collection Time    03/29/13  1:50 PM      Result Value Range   WBC 20.7 (*) 4.0 - 10.5 K/uL   RBC 5.06  4.22 - 5.81 MIL/uL   Hemoglobin 15.2  13.0 - 17.0 g/dL   HCT 43.3  39.0 - 52.0 %   MCV 85.6  78.0 - 100.0 fL   MCH 30.0  26.0 - 34.0 pg   MCHC 35.1  30.0 - 36.0 g/dL   RDW 12.5  11.5 - 15.5 %   Platelets 230  150 - 400 K/uL   Neutrophils Relative % 86 (*) 43 - 77 %   Neutro Abs 17.9 (*) 1.7 - 7.7 K/uL   Lymphocytes Relative 7 (*) 12 - 46 %   Lymphs Abs 1.5  0.7 - 4.0 K/uL   Monocytes Relative 6  3 -  12 %   Monocytes Absolute 1.3 (*) 0.1 - 1.0 K/uL   Eosinophils Relative 0  0 - 5 %   Eosinophils Absolute 0.0  0.0 - 0.7 K/uL   Basophils Relative 0  0 - 1 %   Basophils Absolute 0.1  0.0 - 0.1 K/uL  COMPREHENSIVE METABOLIC PANEL     Status: Abnormal   Collection Time    03/29/13  1:50 PM      Result Value Range   Sodium 137  137 - 147 mEq/L   Potassium 4.2  3.7 - 5.3 mEq/L  Chloride 100  96 - 112 mEq/L   CO2 22  19 - 32 mEq/L   Glucose, Bld 107 (*) 70 - 99 mg/dL   BUN 14  6 - 23 mg/dL   Creatinine, Ser 0.77  0.50 - 1.35 mg/dL   Calcium 9.5  8.4 - 10.5 mg/dL   Total Protein 7.2  6.0 - 8.3 g/dL   Albumin 4.2  3.5 - 5.2 g/dL   AST 14  0 - 37 U/L   ALT 20  0 - 53 U/L   Alkaline Phosphatase 87  39 - 117 U/L   Total Bilirubin 0.3  0.3 - 1.2 mg/dL   GFR calc non Af Amer >90  >90 mL/min   GFR calc Af Amer >90  >90 mL/min   Comment: (NOTE)     The eGFR has been calculated using the CKD EPI equation.     This calculation has not been validated in all clinical situations.     eGFR's persistently <90 mL/min signify possible Chronic Kidney     Disease.  LIPASE, BLOOD     Status: None   Collection Time    03/29/13  1:50 PM      Result Value Range   Lipase 43  11 - 59 U/L  BASIC METABOLIC PANEL     Status: Abnormal   Collection Time    03/30/13  4:34 AM      Result Value Range   Sodium 136 (*) 137 - 147 mEq/L   Potassium 4.2  3.7 - 5.3 mEq/L   Chloride 100  96 - 112 mEq/L   CO2 26  19 - 32 mEq/L   Glucose, Bld 118 (*) 70 - 99 mg/dL   BUN 11  6 - 23 mg/dL   Creatinine, Ser 0.94  0.50 - 1.35 mg/dL   Calcium 8.7  8.4 - 10.5 mg/dL   GFR calc non Af Amer >90  >90 mL/min   GFR calc Af Amer >90  >90 mL/min   Comment: (NOTE)     The eGFR has been calculated using the CKD EPI equation.     This calculation has not been validated in all clinical situations.     eGFR's persistently <90 mL/min signify possible Chronic Kidney     Disease.  CBC     Status: Abnormal   Collection Time     03/30/13  4:34 AM      Result Value Range   WBC 16.9 (*) 4.0 - 10.5 K/uL   RBC 4.19 (*) 4.22 - 5.81 MIL/uL   Hemoglobin 12.9 (*) 13.0 - 17.0 g/dL   HCT 36.5 (*) 39.0 - 52.0 %   MCV 87.1  78.0 - 100.0 fL   MCH 30.8  26.0 - 34.0 pg   MCHC 35.3  30.0 - 36.0 g/dL   RDW 13.0  11.5 - 15.5 %   Platelets 207  150 - 400 K/uL  CBC     Status: Abnormal   Collection Time    03/31/13  4:20 AM      Result Value Range   WBC 15.6 (*) 4.0 - 10.5 K/uL   RBC 4.09 (*) 4.22 - 5.81 MIL/uL   Hemoglobin 12.1 (*) 13.0 - 17.0 g/dL   HCT 36.2 (*) 39.0 - 52.0 %   MCV 88.5  78.0 - 100.0 fL   MCH 29.6  26.0 - 34.0 pg   MCHC 33.4  30.0 - 36.0 g/dL   RDW 12.9  11.5 - 15.5 %  Platelets 193  150 - 400 K/uL  POTASSIUM     Status: None   Collection Time    03/31/13  4:20 AM      Result Value Range   Potassium 4.1  3.7 - 5.3 mEq/L  CREATININE, SERUM     Status: None   Collection Time    03/31/13  4:20 AM      Result Value Range   Creatinine, Ser 0.91  0.50 - 1.35 mg/dL   GFR calc non Af Amer >90  >90 mL/min   GFR calc Af Amer >90  >90 mL/min   Comment: (NOTE)     The eGFR has been calculated using the CKD EPI equation.     This calculation has not been validated in all clinical situations.     eGFR's persistently <90 mL/min signify possible Chronic Kidney     Disease.    Imaging / Studies: Ct Abdomen Pelvis W Contrast  03/29/2013   CLINICAL DATA:  Lower abdominal and pelvic pain.  Diverticulitis.  EXAM: CT ABDOMEN AND PELVIS WITH CONTRAST  TECHNIQUE: Multidetector CT imaging of the abdomen and pelvis was performed using the standard protocol following bolus administration of intravenous contrast.  CONTRAST:  123m OMNIPAQUE IOHEXOL 300 MG/ML  SOLN  COMPARISON:  09/07/2012  FINDINGS: The abdominal parenchymal organs are normal in appearance. No evidence hydronephrosis. No soft tissue masses or lymphadenopathy identified within the abdomen or pelvis.  Moderate diverticulitis is seen involving the mid sigmoid  colon. A tiny amount of extraluminal air is seen in sigmoid mesocolon. A tiny amount of free intraperitoneal air is seen along the anterior margin of the left hepatic lobe. No evidence of abscess or free fluid. No evidence of bowel obstruction. Normal appendix is visualized.  IMPRESSION: Moderate sigmoid diverticulitis, with tiny amount of free intraperitoneal air noted consistent with intraperitoneal perforation.  No evidence of abscess or bowel obstruction.  Critical Value/emergent results were called by telephone at the time of interpretation on 03/29/2013 at 3:23 PM to Dr. CJoseph Berkshire, who verbally acknowledged these results.   Electronically Signed   By: JEarle GellM.D.   On: 03/29/2013 15:26    Medications / Allergies: per chart  Antibiotics: Anti-infectives   Start     Dose/Rate Route Frequency Ordered Stop   03/30/13 1700  ertapenem (INVANZ) 1 g in sodium chloride 0.9 % 50 mL IVPB     1 g 100 mL/hr over 30 Minutes Intravenous Every 24 hours 03/29/13 1730     03/29/13 1700  ertapenem (INVANZ) 1 g in sodium chloride 0.9 % 50 mL IVPB  Status:  Discontinued     1 g 100 mL/hr over 30 Minutes Intravenous Daily 03/29/13 1602 03/29/13 1740       Note: This dictation was prepared with Dragon/digital dictation along with [employer] Any transcriptional errors that result from this process are unintentional.

## 2013-03-31 NOTE — Progress Notes (Signed)
Dakota Vang [id number] Oct 11, 1965  CARE TEAM:  PCP: Unice Cobble, MD  Outpatient Care Team: Patient Care Team: Hendricks Limes, MD as PCP - General Ladene Artist, MD as Consulting Physician (Gastroenterology) Kathlee Nations, MD as Consulting Physician (Psychiatry) [name], MD as Consulting Physician (General Surgery)  Inpatient Treatment Team: Treatment Team: Attending Provider: Nolon Nations, MD; Registered Nurse: Hoyle Barr, RN; Technician: Coralie Carpen, NT; Consulting Physician: [name], MD; Dietitian: Christie Beckers, RD   Subjective:  Pt familiar to me from prior surgery for JEJUNAL diverticulitis Tx'd with IV ABx and delayed resection/anastomosis.  Started PO Cipro/Flagyl when felt pain 5 days ago.  Felt better. Called Fri - did not call back nor over weekend (I was on call) and then suddenly worse Sun night - ED - admitted  Pain intense but less    Objective:  Vital signs:  Filed Vitals:   03/30/13 2215 03/30/13 2300 03/31/13 0500 03/31/13 0614  BP: 122/82  125/78 139/66  Pulse: 111  106 67  Temp: 100.6 F (38.1 C) 98.9 F (37.2 C) 100 F (37.8 C) 98.1 F (36.7 C)  TempSrc: Axillary Oral Axillary Oral  Resp: 18  18 18   Height:      Weight:      SpO2: 95%  95% 98%    Last BM Date: 03/29/13  Intake/Output   Yesterday:  01/20 0701 - 01/21 0700 In: 4230 [P.O.:180; I.V.:3000; IV Piggyback:1050] Out: 2850 [Urine:2850] This shift:  Total I/O In: 1560 [P.O.:60; I.V.:1500] Out: 1650 [Urine:1650]  Bowel function:  Flatus: n  BM: n  Drain: n/a  Physical Exam:  General: Pt awake/alert/oriented x4 in no acute distress Eyes: PERRL, normal EOM.  Sclera clear.  No icterus Neuro: CN II-XII intact w/o focal sensory/motor deficits. Lymph: No head/neck/groin lymphadenopathy Psych:  No delerium/psychosis/paranoia.  Remains inquisitive/questioning/bargaining HENT: Normocephalic, Mucus membranes moist.  No thrush Neck: Supple, No  tracheal deviation Chest: No chest wall pain w good excursion CV:  Pulses intact.  Regular rhythm MS: Normal AROM mjr joints.  No obvious deformity Abdomen: Soft.  Nondistended.  Mod tender LLQ>suprapubic with focal peritonitis.  Rest of abdomen nontender.  No incarcerated hernias. Ext:  SCDs BLE.  No mjr edema.  No cyanosis Skin: No petechiae / purpura   Problem List:   Principal Problem:   Diverticulitis of colon with perforation Active Problems:   ADD   HYPERTENSION   HEMORRHOIDS, INTERNAL   G E R D   COLONIC POLYPS, HX OF   Diverticulitis of jejunum with microperforation s/p lap resection [id number]   Pancolonic diverticulosis   Assessment  Dakota Vang  48 y.o. male       Sigmoid diverticulitis w microperforation  Plan:  IV ABx.  Invanz given NPO x sips -VTE prophylaxis- SCDs, etc -mobilize as tolerated to help recovery  Try to cool down to avoid emergent surgery.  Again he is very concerned about needing ostomy.  I noted we do that in emergent situations to save someone's life.  Hopefully it can cool down & avoid that.    CT scan if does not improve/worsens to r/o abscess formation  Elective sigmoid colectomy eventually given complex episode w microperforation.  Wait 6 weeks from d/c once this attack is under control.  Prob MIS approach (robotic/lap):  The anatomy & physiology of the digestive tract was discussed.  The pathophysiology was discussed.  Natural history risks without surgery was discussed.   I feel the  risks of no intervention will lead to serious problems that outweigh the operative risks; therefore, I recommended a partial colectomy to remove the pathology.  Laparoscopic & open techniques were discussed.   Risks such as bleeding, infection, abscess, leak, reoperation, possible ostomy, hernia, heart attack, death, and other risks were discussed.  I noted a good likelihood this will help address the problem.   Goals of post-operative recovery were  discussed as well.  We will work to minimize complications.  An educational handout on the pathology was given as well.  Questions were answered.  The patient expresses understanding & wishes to proceed with surgery.   [name], M.D., F.A.C.S. Gastrointestinal and Minimally Invasive Surgery [facility] [address], [place], [place] [address] [phone] Main / Paging   03/31/2013   Results:   Labs: Results for orders placed during the hospital encounter of 03/29/13 (from the past 48 hour(s))  URINALYSIS, ROUTINE W REFLEX MICROSCOPIC     Status: None   Collection Time    03/29/13  1:37 PM      Result Value Range   Color, Urine YELLOW  YELLOW   APPearance CLEAR  CLEAR   Specific Gravity, Urine 1.021  1.005 - 1.030   pH 5.5  5.0 - 8.0   Glucose, UA NEGATIVE  NEGATIVE mg/dL   Hgb urine dipstick NEGATIVE  NEGATIVE   Bilirubin Urine NEGATIVE  NEGATIVE   Ketones, ur NEGATIVE  NEGATIVE mg/dL   Protein, ur NEGATIVE  NEGATIVE mg/dL   Urobilinogen, UA 0.2  0.0 - 1.0 mg/dL   Nitrite NEGATIVE  NEGATIVE   Leukocytes, UA NEGATIVE  NEGATIVE   Comment: MICROSCOPIC NOT DONE ON URINES WITH NEGATIVE PROTEIN, BLOOD, LEUKOCYTES, NITRITE, OR GLUCOSE <1000 mg/dL.  CBC WITH DIFFERENTIAL     Status: Abnormal   Collection Time    03/29/13  1:50 PM      Result Value Range   WBC 20.7 (*) 4.0 - 10.5 K/uL   RBC 5.06  4.22 - 5.81 MIL/uL   Hemoglobin 15.2  13.0 - 17.0 g/dL   HCT 43.3  39.0 - 52.0 %   MCV 85.6  78.0 - 100.0 fL   MCH 30.0  26.0 - 34.0 pg   MCHC 35.1  30.0 - 36.0 g/dL   RDW 12.5  11.5 - 15.5 %   Platelets 230  150 - 400 K/uL   Neutrophils Relative % 86 (*) 43 - 77 %   Neutro Abs 17.9 (*) 1.7 - 7.7 K/uL   Lymphocytes Relative 7 (*) 12 - 46 %   Lymphs Abs 1.5  0.7 - 4.0 K/uL   Monocytes Relative 6  3 - 12 %   Monocytes Absolute 1.3 (*) 0.1 - 1.0 K/uL   Eosinophils Relative 0  0 - 5 %   Eosinophils Absolute 0.0  0.0 - 0.7 K/uL   Basophils  Relative 0  0 - 1 %   Basophils Absolute 0.1  0.0 - 0.1 K/uL  COMPREHENSIVE METABOLIC PANEL     Status: Abnormal   Collection Time    03/29/13  1:50 PM      Result Value Range   Sodium 137  137 - 147 mEq/L   Potassium 4.2  3.7 - 5.3 mEq/L   Chloride 100  96 - 112 mEq/L   CO2 22  19 - 32 mEq/L   Glucose, Bld 107 (*) 70 - 99 mg/dL   BUN 14  6 - 23 mg/dL  Creatinine, Ser 0.77  0.50 - 1.35 mg/dL   Calcium 9.5  8.4 - 10.5 mg/dL   Total Protein 7.2  6.0 - 8.3 g/dL   Albumin 4.2  3.5 - 5.2 g/dL   AST 14  0 - 37 U/L   ALT 20  0 - 53 U/L   Alkaline Phosphatase 87  39 - 117 U/L   Total Bilirubin 0.3  0.3 - 1.2 mg/dL   GFR calc non Af Amer >90  >90 mL/min   GFR calc Af Amer >90  >90 mL/min   Comment: (NOTE)     The eGFR has been calculated using the CKD EPI equation.     This calculation has not been validated in all clinical situations.     eGFR's persistently <90 mL/min signify possible Chronic Kidney     Disease.  LIPASE, BLOOD     Status: None   Collection Time    03/29/13  1:50 PM      Result Value Range   Lipase 43  11 - 59 U/L  BASIC METABOLIC PANEL     Status: Abnormal   Collection Time    03/30/13  4:34 AM      Result Value Range   Sodium 136 (*) 137 - 147 mEq/L   Potassium 4.2  3.7 - 5.3 mEq/L   Chloride 100  96 - 112 mEq/L   CO2 26  19 - 32 mEq/L   Glucose, Bld 118 (*) 70 - 99 mg/dL   BUN 11  6 - 23 mg/dL   Creatinine, Ser 0.94  0.50 - 1.35 mg/dL   Calcium 8.7  8.4 - 10.5 mg/dL   GFR calc non Af Amer >90  >90 mL/min   GFR calc Af Amer >90  >90 mL/min   Comment: (NOTE)     The eGFR has been calculated using the CKD EPI equation.     This calculation has not been validated in all clinical situations.     eGFR's persistently <90 mL/min signify possible Chronic Kidney     Disease.  CBC     Status: Abnormal   Collection Time    03/30/13  4:34 AM      Result Value Range   WBC 16.9 (*) 4.0 - 10.5 K/uL   RBC 4.19 (*) 4.22 - 5.81 MIL/uL   Hemoglobin 12.9 (*) 13.0 -  17.0 g/dL   HCT 36.5 (*) 39.0 - 52.0 %   MCV 87.1  78.0 - 100.0 fL   MCH 30.8  26.0 - 34.0 pg   MCHC 35.3  30.0 - 36.0 g/dL   RDW 13.0  11.5 - 15.5 %   Platelets 207  150 - 400 K/uL  CBC     Status: Abnormal   Collection Time    03/31/13  4:20 AM      Result Value Range   WBC 15.6 (*) 4.0 - 10.5 K/uL   RBC 4.09 (*) 4.22 - 5.81 MIL/uL   Hemoglobin 12.1 (*) 13.0 - 17.0 g/dL   HCT 36.2 (*) 39.0 - 52.0 %   MCV 88.5  78.0 - 100.0 fL   MCH 29.6  26.0 - 34.0 pg   MCHC 33.4  30.0 - 36.0 g/dL   RDW 12.9  11.5 - 15.5 %   Platelets 193  150 - 400 K/uL  POTASSIUM     Status: None   Collection Time    03/31/13  4:20 AM      Result Value Range  Potassium 4.1  3.7 - 5.3 mEq/L  CREATININE, SERUM     Status: None   Collection Time    03/31/13  4:20 AM      Result Value Range   Creatinine, Ser 0.91  0.50 - 1.35 mg/dL   GFR calc non Af Amer >90  >90 mL/min   GFR calc Af Amer >90  >90 mL/min   Comment: (NOTE)     The eGFR has been calculated using the CKD EPI equation.     This calculation has not been validated in all clinical situations.     eGFR's persistently <90 mL/min signify possible Chronic Kidney     Disease.    Imaging / Studies: Ct Abdomen Pelvis W Contrast  03/29/2013   CLINICAL DATA:  Lower abdominal and pelvic pain.  Diverticulitis.  EXAM: CT ABDOMEN AND PELVIS WITH CONTRAST  TECHNIQUE: Multidetector CT imaging of the abdomen and pelvis was performed using the standard protocol following bolus administration of intravenous contrast.  CONTRAST:  169m OMNIPAQUE IOHEXOL 300 MG/ML  SOLN  COMPARISON:  09/07/2012  FINDINGS: The abdominal parenchymal organs are normal in appearance. No evidence hydronephrosis. No soft tissue masses or lymphadenopathy identified within the abdomen or pelvis.  Moderate diverticulitis is seen involving the mid sigmoid colon. A tiny amount of extraluminal air is seen in sigmoid mesocolon. A tiny amount of free intraperitoneal air is seen along the anterior  margin of the left hepatic lobe. No evidence of abscess or free fluid. No evidence of bowel obstruction. Normal appendix is visualized.  IMPRESSION: Moderate sigmoid diverticulitis, with tiny amount of free intraperitoneal air noted consistent with intraperitoneal perforation.  No evidence of abscess or bowel obstruction.  Critical Value/emergent results were called by telephone at the time of interpretation on 03/29/2013 at 3:23 PM to Dr. CJoseph Berkshire, who verbally acknowledged these results.   Electronically Signed   By: JEarle GellM.D.   On: 03/29/2013 15:26    Medications / Allergies: per chart  Antibiotics: Anti-infectives   Start     Dose/Rate Route Frequency Ordered Stop   03/30/13 1700  ertapenem (INVANZ) 1 g in sodium chloride 0.9 % 50 mL IVPB     1 g 100 mL/hr over 30 Minutes Intravenous Every 24 hours 03/29/13 1730     03/29/13 1700  ertapenem (INVANZ) 1 g in sodium chloride 0.9 % 50 mL IVPB  Status:  Discontinued     1 g 100 mL/hr over 30 Minutes Intravenous Daily 03/29/13 1602 03/29/13 1740       Note: This dictation was prepared with Dragon/digital dictation along with [employer] Any transcriptional errors that result from this process are unintentional.

## 2013-04-01 LAB — CREATININE, SERUM
CREATININE: 0.85 mg/dL (ref 0.50–1.35)
GFR calc Af Amer: >90 mL/min (ref >90)
GFR calc non Af Amer: >90 mL/min (ref >90)

## 2013-04-01 LAB — CBC
HCT: 35.9 % — ABNORMAL LOW (ref 39.0–52.0)
Hemoglobin: 12.2 g/dL — ABNORMAL LOW (ref 13.0–17.0)
MCH: 29.8 pg (ref 26.0–34.0)
MCHC: 34 g/dL (ref 30.0–36.0)
MCV: 87.6 fL (ref 78.0–100.0)
Platelets: 183 10*3/uL (ref 150–400)
RBC: 4.1 MIL/uL — ABNORMAL LOW (ref 4.22–5.81)
RDW: 12.9 % (ref 11.5–15.5)
WBC: 13.1 10*3/uL — AB (ref 4.0–10.5)

## 2013-04-01 LAB — POTASSIUM: Potassium: 4 mEq/L (ref 3.7–5.3)

## 2013-04-01 MED ORDER — ACETAMINOPHEN 650 MG RE SUPP: 650.0000 mg | Freq: Four times a day (QID) | RECTAL | Status: DC | PRN

## 2013-04-01 MED ORDER — ACETAMINOPHEN 325 MG PO TABS: 325.0000 mg | ORAL_TABLET | Freq: Four times a day (QID) | ORAL | Status: DC | PRN

## 2013-04-01 MED ORDER — HYDROMORPHONE HCL PF 2 MG/ML IJ SOLN
2.0000 mg | INTRAMUSCULAR | Status: DC | PRN
  Administered 2013-04-01 – 2013-04-02 (×13): 2 mg via INTRAVENOUS
  Filled 2013-04-01 (×13): qty 1

## 2013-04-01 MED ORDER — LAMOTRIGINE 200 MG PO TABS
200.0000 mg | ORAL_TABLET | Freq: Two times a day (BID) | ORAL | Status: DC
  Administered 2013-04-01: 200 mg via ORAL
  Administered 2013-04-01: 100 mg via ORAL
  Administered 2013-04-02 (×2): 200 mg via ORAL
  Filled 2013-04-01 (×9): qty 1

## 2013-04-01 MED ORDER — NAPROXEN 500 MG PO TABS
500.0000 mg | ORAL_TABLET | Freq: Two times a day (BID) | ORAL | Status: DC
  Administered 2013-04-02 – 2013-04-03 (×3): 500 mg via ORAL
  Filled 2013-04-01 (×6): qty 1

## 2013-04-01 MED ORDER — METOPROLOL TARTRATE 12.5 MG HALF TABLET: 12.5000 mg | ORAL_TABLET | Freq: Two times a day (BID) | ORAL | Status: DC

## 2013-04-01 NOTE — Progress Notes (Addendum)
  Subjective: Still having pain, says dr.Gross was in later, he is letting him have sips.  Pt says Dr.Gross talked about CT tomorrow.    Objective: Vital signs in last 24 hours: Temp:  [98.1 F (36.7 C)-99 F (37.2 C)] 99 F (37.2 C) (01/22 0541) Pulse Rate:  [93-97] 97 (01/22 0541) Resp:  [18] 18 (01/22 0541) BP: (124-140)/(80-81) 126/81 mmHg (01/22 0541) SpO2:  [97 %-99 %] 98 % (01/22 0541) Last BM Date: 03/29/13 240 PO today, Clears started this Am Afebrile, VSS WBC slowly improving: 20.7 on admit, down to 13.1 today Intake/Output from previous day: 01/21 0701 - 01/22 0700 In: 1128.3 [P.O.:60; I.V.:1068.3] Out: 3850 [Urine:3850] Intake/Output this shift: Total I/O In: 240 [P.O.:240] Out: 200 [Urine:200]  General appearance: alert, cooperative and no distress GI: soft, tender RLQ.  Lab Results:   Recent Labs  03/31/13 0420 04/01/13 0500  WBC 15.6* 13.1*  HGB 12.1* 12.2*  HCT 36.2* 35.9*  PLT 193 183    BMET  Recent Labs  03/29/13 1350 03/30/13 0434 03/31/13 0420 04/01/13 0500  NA 137 136*  --   --   K 4.2 4.2 4.1 4.0  CL 100 100  --   --   CO2 22 26  --   --   GLUCOSE 107* 118*  --   --   BUN 14 11  --   --   CREATININE 0.77 0.94 0.91 0.85  CALCIUM 9.5 8.7  --   --    PT/INR No results found for this basename: LABPROT, INR,  in the last 72 hours   Recent Labs Lab 03/29/13 1350  AST 14  ALT 20  ALKPHOS 87  BILITOT 0.3  PROT 7.2  ALBUMIN 4.2     Lipase     Component Value Date/Time   LIPASE 43 03/29/2013 1350     Studies/Results: No results found.  Medications: . amLODipine  5 mg Oral Daily  . aspirin EC  81 mg Oral Daily  . buPROPion  300 mg Oral Q breakfast  . carvedilol  6.25 mg Oral BID WC  . ertapenem (INVANZ) IV  1 g Intravenous Q24H  . heparin  5,000 Units Subcutaneous Q8H  . lamoTRIgine  200 mg Oral BID  . lip balm  1 application Topical BID  . methylphenidate  20 mg Oral BID WC  . naproxen  500 mg Oral BID WC  .  pantoprazole  40 mg Oral Q1200  . ramipril  10 mg Oral Daily  . saccharomyces boulardii  250 mg Oral BID    Assessment/Plan 1. Sigmoid Diverticulitis with microperforation  2. S/P laparoscopic assisted resection of the proximal jejunum on 09/24/12, by Dr. Johney Maine  3. Pancolonic diverticulosis  4. Hypertension  5. ADD hyperactive/Mood disorder/Anxiety  6. GERD  7. Hx of recurrent constipation  8. Hyperlipidemia  9. Body mass index is 27.7 Wife reports 30 pound weight loss since July.   Plan:  Continue antibiotics and discuss with Dr. Johney Maine timing for next CT.   LOS: 3 days    Gabriellia Rempel 04/01/2013

## 2013-04-01 NOTE — Progress Notes (Signed)
Dakota Vang [id number] December 19, 1965  CARE TEAM:  PCP: Unice Cobble, MD  Outpatient Care Team: Patient Care Team: Hendricks Limes, MD as PCP - General Ladene Artist, MD as Consulting Physician (Gastroenterology) Kathlee Nations, MD as Consulting Physician (Psychiatry) [name], MD as Consulting Physician (General Surgery)  Inpatient Treatment Team: Treatment Team: Attending Provider: [name], MD; Registered Nurse: Hoyle Barr, RN; Technician: Coralie Carpen, NT; Consulting Physician: [name], MD; Dietitian: Christie Beckers, RD; Registered Nurse: Jordan Likes, RN; Registered Nurse: Peggye Fothergill, RN   Subjective:  Pt familiar to me from prior surgery for JEJUNAL diverticulitis Tx'd with IV ABx and delayed resection/anastomosis.  Started PO Cipro/Flagyl when felt pain 5 days ago.  Felt better. Called Fri - did not call back nor over weekend (I was on call) and then suddenly worse Sun night - ED - admitted  Pain moderate & not much improved  Walking in hallways more  RN in room   Objective:  Vital signs:  Filed Vitals:   03/31/13 0614 03/31/13 1329 03/31/13 2155 04/01/13 0541  BP: 139/66 140/81 124/80 126/81  Pulse: 67 93 97 97  Temp: 98.1 F (36.7 C) 98.1 F (36.7 C) 98.2 F (36.8 C) 99 F (37.2 C)  TempSrc: Oral Oral Oral Oral  Resp: 18 18 18 18   Height:      Weight:      SpO2: 98% 99% 97% 98%    Last BM Date: 03/29/13  Intake/Output   Yesterday:  01/21 0701 - 01/22 0700 In: 1128.3 [P.O.:60; I.V.:1068.3] Out: 3850 [Urine:3850] This shift:  Total I/O In: -  Out: 200 [Urine:200]  Bowel function:  Flatus: Yes  BM: n  Drain: n/a  Physical Exam:  General: Pt awake/alert/oriented x4 in no acute distress Eyes: PERRL, normal EOM.  Sclera clear.  No icterus Neuro: CN II-XII intact w/o focal sensory/motor deficits. Lymph: No head/neck/groin lymphadenopathy Psych:  No delerium/psychosis/paranoia.  Remains  inquisitive/questioning/bargaining HENT: Normocephalic, Mucus membranes moist.  No thrush Neck: Supple, No tracheal deviation Chest: No chest wall pain w good excursion CV:  Pulses intact.  Regular rhythm MS: Normal AROM mjr joints.  No obvious deformity Abdomen: Soft.  Nondistended.  Tender LLQ>suprapubic with minimal focal peritonitis - SAME.   Rest of abdomen nontender.  No incarcerated hernias. Ext:  SCDs BLE.  No mjr edema.  No cyanosis Skin: No petechiae / purpura   Problem List:   Principal Problem:   Diverticulitis of colon with perforation Active Problems:   ADD   HYPERTENSION   HEMORRHOIDS, INTERNAL   G E R D   COLONIC POLYPS, HX OF   Constipation   Diverticulitis of jejunum with microperforation s/p lap resection [id number]   Pancolonic diverticulosis   Assessment  Dakota Vang  48 y.o. male       Sigmoid diverticulitis w microperforation - Plateauing in pain control but WBC continues to improve & ileus resolving  Plan:  IV ABx.  Invanz given  CT scan to r/o abscess in AM  Sips, <102m/day liquids  VTE prophylaxis- SCDs, etc  Continue ADHD meds  Control GERD  Mobilize as tolerated to help recovery  Try to cool down to avoid emergent surgery.  Again he is very concerned about needing ostomy.  I noted we do that in emergent situations to save someone's life.  Hopefully it can cool down & avoid that.    Elective sigmoid colectomy eventually given complex episode w microperforation.  Wait 6 weeks from d/c once this attack is under control.  Last endoscopy late 2013 = no need for new one.  Prob MIS approach (robotic/lap):  The anatomy & physiology of the digestive tract was discussed.  The pathophysiology was discussed.  Natural history risks without surgery was discussed.   I feel the risks of no intervention will lead to serious problems that outweigh the operative risks; therefore, I recommended a partial colectomy to remove the pathology.  Robotic,  Laparoscopic & open techniques were discussed.   Risks such as bleeding, infection, abscess, leak, reoperation, possible ostomy, hernia, heart attack, death, and other risks were discussed.  I noted a good likelihood this will help address the problem.   Goals of post-operative recovery were discussed as well.  We will work to minimize complications.  An educational handout on the pathology was given as well.  Questions were answered.  The patient expresses understanding & wishes to proceed with surgery.   [name], M.D., F.A.C.S. Gastrointestinal and Minimally Invasive Surgery [facility] [address], [place], [place] [address] [phone] Main / Paging   04/01/2013   Results:   Labs: Results for orders placed during the hospital encounter of 03/29/13 (from the past 48 hour(s))  CBC     Status: Abnormal   Collection Time    03/31/13  4:20 AM      Result Value Range   WBC 15.6 (*) 4.0 - 10.5 K/uL   RBC 4.09 (*) 4.22 - 5.81 MIL/uL   Hemoglobin 12.1 (*) 13.0 - 17.0 g/dL   HCT 36.2 (*) 39.0 - 52.0 %   MCV 88.5  78.0 - 100.0 fL   MCH 29.6  26.0 - 34.0 pg   MCHC 33.4  30.0 - 36.0 g/dL   RDW 12.9  11.5 - 15.5 %   Platelets 193  150 - 400 K/uL  POTASSIUM     Status: None   Collection Time    03/31/13  4:20 AM      Result Value Range   Potassium 4.1  3.7 - 5.3 mEq/L  CREATININE, SERUM     Status: None   Collection Time    03/31/13  4:20 AM      Result Value Range   Creatinine, Ser 0.91  0.50 - 1.35 mg/dL   GFR calc non Af Amer >90  >90 mL/min   GFR calc Af Amer >90  >90 mL/min   Comment: (NOTE)     The eGFR has been calculated using the CKD EPI equation.     This calculation has not been validated in all clinical situations.     eGFR's persistently <90 mL/min signify possible Chronic Kidney     Disease.  CBC     Status: Abnormal   Collection Time    04/01/13  5:00 AM      Result Value Range   WBC 13.1 (*) 4.0 - 10.5 K/uL   RBC  4.10 (*) 4.22 - 5.81 MIL/uL   Hemoglobin 12.2 (*) 13.0 - 17.0 g/dL   HCT 35.9 (*) 39.0 - 52.0 %   MCV 87.6  78.0 - 100.0 fL   MCH 29.8  26.0 - 34.0 pg   MCHC 34.0  30.0 - 36.0 g/dL   RDW 12.9  11.5 - 15.5 %   Platelets 183  150 - 400 K/uL  POTASSIUM     Status: None   Collection Time    04/01/13  5:00 AM  Result Value Range   Potassium 4.0  3.7 - 5.3 mEq/L  CREATININE, SERUM     Status: None   Collection Time    04/01/13  5:00 AM      Result Value Range   Creatinine, Ser 0.85  0.50 - 1.35 mg/dL   GFR calc non Af Amer >90  >90 mL/min   GFR calc Af Amer >90  >90 mL/min   Comment: (NOTE)     The eGFR has been calculated using the CKD EPI equation.     This calculation has not been validated in all clinical situations.     eGFR's persistently <90 mL/min signify possible Chronic Kidney     Disease.    Imaging / Studies: No results found.  Medications / Allergies: per chart  Antibiotics: Anti-infectives   Start     Dose/Rate Route Frequency Ordered Stop   03/30/13 1700  ertapenem (INVANZ) 1 g in sodium chloride 0.9 % 50 mL IVPB     1 g 100 mL/hr over 30 Minutes Intravenous Every 24 hours 03/29/13 1730     03/29/13 1700  ertapenem (INVANZ) 1 g in sodium chloride 0.9 % 50 mL IVPB  Status:  Discontinued     1 g 100 mL/hr over 30 Minutes Intravenous Daily 03/29/13 1602 03/29/13 1740       Note: This dictation was prepared with Dragon/digital dictation along with [employer]. Any transcriptional errors that result from this process are unintentional.

## 2013-04-01 NOTE — Progress Notes (Signed)
Agree with the assessment and treatment plan outlined by Mr. Dakota Vang.  I discussed the patient's care with Dr. Johney Maine. He is taking care of this patient primarily. For CT scan tomorrow.  We will be available to help as necessary.   Dakota Vang. Dalbert Batman, M.D., [facility] General and Minimally invasive Surgery Breast and Colorectal Surgery Office:   [phone] Pager:   [phone]

## 2013-04-02 ENCOUNTER — Inpatient Hospital Stay (HOSPITAL_COMMUNITY): Payer: 59

## 2013-04-02 ENCOUNTER — Encounter (HOSPITAL_COMMUNITY): Payer: Self-pay | Admitting: Radiology

## 2013-04-02 LAB — CBC
HCT: 37.4 % — ABNORMAL LOW (ref 39.0–52.0)
HEMOGLOBIN: 12.7 g/dL — AB (ref 13.0–17.0)
MCH: 29.6 pg (ref 26.0–34.0)
MCHC: 34 g/dL (ref 30.0–36.0)
MCV: 87.2 fL (ref 78.0–100.0)
Platelets: 255 10*3/uL (ref 150–400)
RBC: 4.29 MIL/uL (ref 4.22–5.81)
RDW: 13 % (ref 11.5–15.5)
WBC: 10 10*3/uL (ref 4.0–10.5)

## 2013-04-02 LAB — POTASSIUM: Potassium: 3.8 mEq/L (ref 3.7–5.3)

## 2013-04-02 LAB — CREATININE, SERUM
Creatinine, Ser: 0.85 mg/dL (ref 0.50–1.35)
GFR calc non Af Amer: >90 mL/min (ref >90)

## 2013-04-02 MED ORDER — FLUCONAZOLE IN SODIUM CHLORIDE 200-0.9 MG/100ML-% IV SOLN
200.0000 mg | Freq: Once | INTRAVENOUS | Status: AC
  Administered 2013-04-02: 200 mg via INTRAVENOUS
  Filled 2013-04-02: qty 100

## 2013-04-02 MED ORDER — FENTANYL CITRATE 0.05 MG/ML IJ SOLN
INTRAMUSCULAR | Status: AC | PRN
  Administered 2013-04-02 (×2): 100 ug via INTRAVENOUS

## 2013-04-02 MED ORDER — FENTANYL CITRATE 0.05 MG/ML IJ SOLN
INTRAMUSCULAR | Status: AC
Start: 2013-04-02 — End: 2013-04-03
  Filled 2013-04-02: qty 6

## 2013-04-02 MED ORDER — CIPROFLOXACIN IN D5W 400 MG/200ML IV SOLN
400.0000 mg | Freq: Two times a day (BID) | INTRAVENOUS | Status: DC
  Administered 2013-04-03: 400 mg via INTRAVENOUS
  Filled 2013-04-02 (×2): qty 200

## 2013-04-02 MED ORDER — IOHEXOL 300 MG/ML  SOLN
100.0000 mL | Freq: Once | INTRAMUSCULAR | Status: AC | PRN
  Administered 2013-04-02: 100 mL via INTRAVENOUS

## 2013-04-02 MED ORDER — OXYCODONE HCL 5 MG PO TABS: 5.0000 mg | ORAL_TABLET | ORAL | Status: DC | PRN

## 2013-04-02 MED ORDER — IOHEXOL 300 MG/ML  SOLN: 50.0000 mL | INTRAMUSCULAR | Status: AC

## 2013-04-02 MED ORDER — MIDAZOLAM HCL 2 MG/2ML IJ SOLN
INTRAMUSCULAR | Status: AC
  Filled 2013-04-02: qty 6

## 2013-04-02 MED ORDER — FLUCONAZOLE IN SODIUM CHLORIDE 200-0.9 MG/100ML-% IV SOLN
200.0000 mg | INTRAVENOUS | Status: DC
  Filled 2013-04-02: qty 100

## 2013-04-02 MED ORDER — NAPROXEN 500 MG PO TABS: 500.0000 mg | ORAL_TABLET | Freq: Two times a day (BID) | ORAL | Status: DC

## 2013-04-02 MED ORDER — FLUCONAZOLE 200 MG PO TABS
200.0000 mg | ORAL_TABLET | Freq: Every day | ORAL | Status: DC
Start: 2013-04-02 — End: 2013-04-23

## 2013-04-02 MED ORDER — MIDAZOLAM HCL 2 MG/2ML IJ SOLN
INTRAMUSCULAR | Status: AC | PRN
  Administered 2013-04-02 (×2): 1 mg via INTRAVENOUS
  Administered 2013-04-02: 2 mg via INTRAVENOUS

## 2013-04-02 MED ORDER — CIPROFLOXACIN HCL 500 MG PO TABS: 500.0000 mg | ORAL_TABLET | Freq: Two times a day (BID) | ORAL | Status: DC

## 2013-04-02 MED ORDER — CIPROFLOXACIN IN D5W 400 MG/200ML IV SOLN
400.0000 mg | Freq: Once | INTRAVENOUS | Status: AC
  Administered 2013-04-02: 400 mg via INTRAVENOUS
  Filled 2013-04-02: qty 200

## 2013-04-02 MED ORDER — METRONIDAZOLE 500 MG PO TABS: 500.0000 mg | ORAL_TABLET | Freq: Three times a day (TID) | ORAL | Status: DC

## 2013-04-02 NOTE — Progress Notes (Signed)
Pt feeling much better  Aspiration done Hungry Wife in room Abd soft w/o pain  A/p  Try full liquids & adv over weekend  D/C patient from hospital when patient meets criteria (anticipate in 1-2 day(s)):  Tolerating oral intake well Ambulating in walkways Adequate pain control without IV medications Urinating  Having flatus   IV ABx.  Switch to Cipro/Flagyl/Fluconazole x10 more days upon d/c  Note: This dictation was prepared with Dragon/digital dictation along with [employer]. Any transcriptional errors that result from this process are unintentional.        Patient Active Problem List   Diagnosis Date Noted  . Diverticulitis of colon with perforation 03/29/2013  . Pancolonic diverticulosis 09/09/2012  . Diverticulitis of jejunum with microperforation s/p lap resection ID:[id number] 08/28/2012  . Constipation 08/27/2012  . ADD 11/20/2007  . COLONIC POLYPS, HX OF 11/20/2007  . HYPERLIPIDEMIA 06/12/2007  . HYPERTENSION 06/12/2007  . HEMORRHOIDS, INTERNAL 11/27/2006  . G E R D 09/19/2006    Past Medical History  Diagnosis Date  . GERD (gastroesophageal reflux disease)   . Colon polyps 2008    Dr Fuller Plan  . Diverticulosis 2002    Dr Velora Heckler  . Hyperlipidemia   . Diverticulitis     PMH of  . ADD (attention deficit disorder with hyperactivity)     Dr. Donalee Citrin  . Anxiety     Dr. Donalee Citrin  . Hypertension   . Diverticulitis of sigmoid colon 06/24/2008    2005 by CT scan    . Pancolonic diverticulosis 09/09/2012  . COLONIC POLYPS, HX OF 11/20/2007    Qualifier: Diagnosis of  By: Linna Darner MD, Gwyndolyn Saxon   Last colonoscopy negative 2013 PMH of plyps X 2; Dr Fuller Plan, GI    . Duodenal diverticulum 08/28/2012  . Diverticulitis of jejunum with microperforation s/p lap resection ID:[id number] 08/28/2012    Qualifier: Diagnosis of  By: Linna Darner MD, Gwyndolyn Saxon   6/1-3/14 [facility] : ? Jejunal perforation F/U CT recommended 08/24/12     Past Surgical History   Procedure Laterality Date  . Tonsillectomy    . Lasik      bilaterally  . Colonoscopy w/ polypectomy  2002 & 2008    Dr. Fuller Plan; polyps X 2  . Colonoscopy  2013    Dr Fuller Plan  . Laparoscopic partial colectomy N/A 09/24/2012    Procedure: laparoscopic assisted  resection of proximal jejunum containing the jejunal diverticulum;  Surgeon: [name], MD;  Location: [facility];  Service: General;  Laterality: N/A;  . Laparoscopic small bowel resection  09/24/2012    excision of proximal jejunum for jejunal diverticulitis    History   Social History  . Marital Status: Married    Spouse Name: N/A    Number of Children: N/A  . Years of Education: N/A   Occupational History  . Not on file.   Social History Main Topics  . Smoking status: Current Some Day Smoker    Types: Cigarettes  . Smokeless tobacco: Never Used     Comment: 1 cigar / month; previously up to 1/2 ppd for 5 years  . Alcohol Use: 3.6 - 4.8 oz/week    6-8 Cans of beer per week     Comment: Beer on weekend   . Drug Use: No  . Sexual Activity: Not on file   Other Topics Concern  . Not on file   Social History Narrative  . No narrative on file    Family History  Problem  Relation Age of Onset  . Colon cancer Father     in 8s  . Lung cancer Father     smoker; asbestos exposure  . Cancer Father     Mesothelioma  . Coronary artery disease Mother     CAGB X 2 & angio 3-4X  . Cancer Mother     Gallbladder  . Coronary artery disease Maternal Grandmother   . COPD Maternal Grandmother   . Heart attack Maternal Grandfather     late 60's  . Heart attack Paternal Grandfather 10  . Diabetes Neg Hx   . Stroke Neg Hx     Current Facility-Administered Medications  Medication Dose Route Frequency Provider Last Rate Last Dose  . acetaminophen (TYLENOL) suppository 650 mg  650 mg Rectal Q6H PRN [name], MD      . acetaminophen (TYLENOL) tablet 325-650 mg  325-650 mg Oral Q6H PRN [name], MD      .  alum & mag hydroxide-simeth (MAALOX/MYLANTA) 200-200-20 MG/5ML suspension 30 mL  30 mL Oral Q6H PRN [name], MD      . amLODipine (NORVASC) tablet 5 mg  5 mg Oral Daily Earnstine Regal, PA-C   5 mg at 04/02/13 1102  . aspirin EC tablet 81 mg  81 mg Oral Daily [name], MD   81 mg at 04/02/13 1103  . buPROPion (WELLBUTRIN XL) 24 hr tablet 300 mg  300 mg Oral Q breakfast Earnstine Regal, PA-C   300 mg at 04/02/13 1103  . carvedilol (COREG) tablet 6.25 mg  6.25 mg Oral BID WC Earnstine Regal, PA-C   6.25 mg at 04/02/13 1101  . [START ON 04/03/2013] ciprofloxacin (CIPRO) IVPB 400 mg  400 mg Intravenous Q12H [name], MD      . dextrose 5 % and 0.9 % NaCl with KCl 20 mEq/L infusion   Intravenous Continuous [name], MD 75 mL/hr at 04/02/13 1323 75 mL/hr at 04/02/13 1323  . diphenhydrAMINE (BENADRYL) injection 12.5-25 mg  12.5-25 mg Intravenous Q6H PRN Earnstine Regal, PA-C       Or  . diphenhydrAMINE (BENADRYL) 12.5 MG/5ML elixir 12.5-25 mg  12.5-25 mg Oral Q6H PRN Earnstine Regal, PA-C      . ertapenem [facility]) 1 g in sodium chloride 0.9 % 50 mL IVPB  1 g Intravenous Q24H Earnstine Regal, PA-C   1 g at 04/01/13 1730  . fentaNYL (SUBLIMAZE) 0.05 MG/ML injection           . [START ON 04/03/2013] fluconazole (DIFLUCAN) IVPB 200 mg  200 mg Intravenous Q24H [name], MD      . heparin injection 5,000 Units  5,000 Units Subcutaneous Q8H Earnstine Regal, PA-C   5,000 Units at 04/02/13 0522  . HYDROmorphone (DILAUDID) injection 2 mg  2 mg Intravenous Q2H PRN [name], MD   2 mg at 04/02/13 1643  . lamoTRIgine (LAMICTAL) tablet 200 mg  200 mg Oral BID [name], MD   200 mg at 04/02/13 1102  . lip balm (CARMEX) ointment 1 application  1 application Topical BID [name], MD   1 application at 15/40/08 1103  . LORazepam (ATIVAN) injection 0.5-1 mg  0.5-1 mg Intravenous Q8H PRN [name], MD      . magic mouthwash  15 mL Oral QID PRN [name], MD      . menthol-cetylpyridinium (CEPACOL) lozenge 3 mg  1 lozenge Oral PRN [name],  MD      . methylphenidate (RITALIN) tablet 20 mg  20 mg Oral BID WC Clovis Cao ([facility]) Belenda Cruise, RN   20 mg at 04/02/13 1110  . metoprolol (LOPRESSOR) injection 5 mg  5 mg Intravenous Q6H PRN [name], MD      . midazolam (VERSED) 2 MG/2ML injection           . naproxen (NAPROSYN) tablet 500 mg  500 mg Oral BID WC [name], MD   500 mg at 04/02/13 1102  . ondansetron (ZOFRAN) injection 4 mg  4 mg Intravenous Q6H PRN Earnstine Regal, PA-C      . pantoprazole (PROTONIX) EC tablet 40 mg  40 mg Oral Q1200 [name], MD   40 mg at 04/02/13 1102  . phenol (CHLORASEPTIC) mouth spray 2 spray  2 spray Mouth/Throat PRN [name], MD      . promethazine (PHENERGAN) injection 6.25-25 mg  6.25-25 mg Intravenous Q6H PRN [name], MD      . ramipril (ALTACE) capsule 10 mg  10 mg Oral Daily Earnstine Regal, PA-C   10 mg at 04/02/13 1103  . saccharomyces boulardii (FLORASTOR) capsule 250 mg  250 mg Oral BID [name], MD   250 mg at 04/02/13 1101     Allergies  Allergen Reactions  . Penicillins Other (See Comments)    ? Reaction @ 48 yrs old    BP 119/79  Pulse 88  Temp(Src) 98.3 F (36.8 C) (Oral)  Resp 9  Ht 6\' 1"  (1.854 m)  Wt 210 lb (95.255 kg)  BMI 27.71 kg/m2  SpO2 98%  Ct Abdomen Pelvis W Contrast  04/02/2013   CLINICAL DATA:  Sigmoid diverticulitis, lower abdominal pain  EXAM: CT ABDOMEN AND PELVIS WITH CONTRAST  TECHNIQUE: Multidetector CT imaging of the abdomen and pelvis was performed using the standard protocol following bolus administration of intravenous contrast.  CONTRAST:  1108mL OMNIPAQUE IOHEXOL 300 MG/ML  SOLN  COMPARISON:  03/29/2013  FINDINGS: Minor basilar atelectasis. No lower lobe pneumonia. No pericardial or pleural effusion. Normal heart size.  Abdomen: Increased pneumoperitoneum beneath the right hemidiaphragm overlying the liver. Slight  interval progression of acute sigmoid diverticulitis in the lower mid abdomen anteriorly. There is increased inflammation and edema within the surrounding mesenteric fat at the site of diverticulitis. In this region, there is also increased entrapped air within the mesentery. At least 3 small fluid collections are now present compatible with small intra-abdominal abscesses. One small developing abscess measures 2.8 cm, image 50. A second small abscess measures 18 mm, image 62. A third small air-fluid collection just beneath the anterior abdominal wall measures 3.1 cm, image 64.  Liver, gallbladder, biliary system, pancreas, spleen, adrenal glands, and kidneys are within normal limits for age and demonstrate no acute process. Incidental calcified splenic granulomata noted.  Negative for bowel obstruction, dilatation, or ileus. Incidental duodenum diverticulum noted with an air-fluid level, image 39.  Atherosclerosis of the aorta without aneurysm  Pelvis: Normal appendix. No significant pelvic free fluid, additional fluid collection, hemorrhage, or adenopathy. Small fat containing inguinal hernias bilaterally. Urinary bladder unremarkable. Pelvic calcifications consistent with venous phleboliths.  Degenerative changes noted of the spine.  IMPRESSION: Interval worsening sigmoid diverticulitis in the lower abdomen anteriorly with increased pericolonic edema and inflammation.  Slight increased pneumoperitoneum over the liver as well as entrapped air in the mesentery at the site of diverticulitis  Three small developing mesenteric fluid collections compatible with intra-abdominal abscesses.  These results were called by telephone at the time of interpretation on 04/02/2013 at 12:44 PM to Dr. Rex Boston , who verbally acknowledged these results.   Electronically Signed   By: Daryll Brod M.D.   On: 04/02/2013 12:45   Ct Abdomen Pelvis W Contrast  03/29/2013   CLINICAL DATA:  Lower abdominal and pelvic pain.   Diverticulitis.  EXAM: CT ABDOMEN AND PELVIS WITH CONTRAST  TECHNIQUE: Multidetector CT imaging of the abdomen and pelvis was performed using the standard protocol following bolus administration of intravenous contrast.  CONTRAST:  181mL OMNIPAQUE IOHEXOL 300 MG/ML  SOLN  COMPARISON:  09/07/2012  FINDINGS: The abdominal parenchymal organs are normal in appearance. No evidence hydronephrosis. No soft tissue masses or lymphadenopathy identified within the abdomen or pelvis.  Moderate diverticulitis is seen involving the mid sigmoid colon. A tiny amount of extraluminal air is seen in sigmoid mesocolon. A tiny amount of free intraperitoneal air is seen along the anterior margin of the left hepatic lobe. No evidence of abscess or free fluid. No evidence of bowel obstruction. Normal appendix is visualized.  IMPRESSION: Moderate sigmoid diverticulitis, with tiny amount of free intraperitoneal air noted consistent with intraperitoneal perforation.  No evidence of abscess or bowel obstruction.  Critical Value/emergent results were called by telephone at the time of interpretation on 03/29/2013 at 3:23 PM to Dr. Joseph Berkshire , who verbally acknowledged these results.   Electronically Signed   By: Earle Gell M.D.   On: 03/29/2013 15:26

## 2013-04-02 NOTE — Progress Notes (Signed)
Dakota Vang [id number] 06-24-1965  CARE TEAM:  PCP: Unice Cobble, MD  Outpatient Care Team: Patient Care Team: Hendricks Limes, MD as PCP - General Ladene Artist, MD as Consulting Physician (Gastroenterology) Kathlee Nations, MD as Consulting Physician (Psychiatry) [name], MD as Consulting Physician (General Surgery)  Inpatient Treatment Team: Treatment Team: Attending Provider: [name], MD; Technician: Coralie Carpen, NT; Consulting Physician: [name], MD; Dietitian: Christie Beckers, RD; Registered Nurse: Jordan Likes, RN; Registered Nurse: Peggye Fothergill, RN; Registered Nurse: Franco Collet, RN; Technician: Cheri Fowler, NT   Subjective:  Pt familiar to me from prior surgery for JEJUNAL diverticulitis Tx'd with IV ABx and delayed resection/anastomosis.  Started PO Cipro/Flagyl when felt pain 5 days ago.  Felt better. Called Fri - did not call back nor over weekend (I was on call) and then suddenly worse Sun night - ED - admitted  Pain moderate   Walking in hallways  Objective:  Vital signs:  Filed Vitals:   04/01/13 0541 04/01/13 1413 04/01/13 2223 04/02/13 0608  BP: 126/81 133/88 110/71 125/83  Pulse: 97 92 85 85  Temp: 99 F (37.2 C) 98.7 F (37.1 C) 98.2 F (36.8 C) 98.2 F (36.8 C)  TempSrc: Oral Oral Oral Oral  Resp: _0 Height:      Weight:      SpO2: 98% 99% 91% 96%    Last BM Date: 03/29/13  Intake/Output   Yesterday:  01/22 0701 - 01/23 0700 In: 2252.5 [P.O.:240; I.V.:1912.5; IV Piggyback:100] Out: 2625 [Urine:2625] This shift:     Bowel function:  Flatus: Yes  BM: n  Drain: n/a  Physical Exam:  Pt not in room  Problem List:   Principal Problem:   Diverticulitis of colon with perforation Active Problems:   ADD   HYPERTENSION   HEMORRHOIDS, INTERNAL   G E R D   COLONIC POLYPS, HX OF   Constipation   Diverticulitis of jejunum with microperforation s/p lap resection  [id number]   Pancolonic diverticulosis   Assessment  Dakota Vang  48 y.o. male       Sigmoid diverticulitis w microperforation - Plateauing in pain control but WBC continues to improve & ileus resolving.  Plan:   CT scan today Shows moderate inflammation with small abscesses.  Discussed with radiology.  Dr. Clovis Riley feels at least one of the areas drainable.  Probably too small to leave a drain but can at least aspirate.  I asked for [facility] to consider aspiration.  IV ABx.  Invanz with PCN allergy.  We will broaden antibiotics to include Cipro and fluconazole given his history of recurrent infections.  Sips, <1024m/day liquids  VTE prophylaxis- SCDs, etc  Continue ADHD meds  Control GERD  Mobilize as tolerated to help recovery  Try to cool down to avoid emergent surgery.  Again he is very concerned about needing ostomy.  I have noted we do that in emergent situations to save someone's life.  Hopefully it can cool down & avoid that.    Elective sigmoid colectomy eventually given complex episode w microperforation.  Wait 6 weeks from d/c once this attack is under control.  Last endoscopy late 2013 = no need for new one.  Prob MIS approach (robotic/lap):  The anatomy & physiology of the digestive tract was discussed.  The pathophysiology was discussed.  Natural history risks without surgery was discussed.   I feel the risks of no  intervention will lead to serious problems that outweigh the operative risks; therefore, I recommended a partial colectomy to remove the pathology.  Robotic, Laparoscopic & open techniques were discussed.   Risks such as bleeding, infection, abscess, leak, reoperation, possible ostomy, hernia, heart attack, death, and other risks were discussed.  I noted a good likelihood this will help address the problem.   Goals of post-operative recovery were discussed as well.  We will work to minimize complications.  An educational handout on the pathology was  given as well.  Questions were answered.  The patient expresses understanding & wishes to proceed with surgery.   [name], M.D., F.A.C.S. Gastrointestinal and Minimally Invasive Surgery [facility] [address], [place], [place] [address] [phone] Main / Paging   04/02/2013   Results:   Labs: Results for orders placed during the hospital encounter of 03/29/13 (from the past 48 hour(s))  CBC     Status: Abnormal   Collection Time    04/01/13  5:00 AM      Result Value Range   WBC 13.1 (*) 4.0 - 10.5 K/uL   RBC 4.10 (*) 4.22 - 5.81 MIL/uL   Hemoglobin 12.2 (*) 13.0 - 17.0 g/dL   HCT 35.9 (*) 39.0 - 52.0 %   MCV 87.6  78.0 - 100.0 fL   MCH 29.8  26.0 - 34.0 pg   MCHC 34.0  30.0 - 36.0 g/dL   RDW 12.9  11.5 - 15.5 %   Platelets 183  150 - 400 K/uL  POTASSIUM     Status: None   Collection Time    04/01/13  5:00 AM      Result Value Range   Potassium 4.0  3.7 - 5.3 mEq/L  CREATININE, SERUM     Status: None   Collection Time    04/01/13  5:00 AM      Result Value Range   Creatinine, Ser 0.85  0.50 - 1.35 mg/dL   GFR calc non Af Amer >90  >90 mL/min   GFR calc Af Amer >90  >90 mL/min   Comment: (NOTE)     The eGFR has been calculated using the CKD EPI equation.     This calculation has not been validated in all clinical situations.     eGFR's persistently <90 mL/min signify possible Chronic Kidney     Disease.  CBC     Status: Abnormal   Collection Time    04/02/13  4:56 AM      Result Value Range   WBC 10.0  4.0 - 10.5 K/uL   RBC 4.29  4.22 - 5.81 MIL/uL   Hemoglobin 12.7 (*) 13.0 - 17.0 g/dL   HCT 37.4 (*) 39.0 - 52.0 %   MCV 87.2  78.0 - 100.0 fL   MCH 29.6  26.0 - 34.0 pg   MCHC 34.0  30.0 - 36.0 g/dL   RDW 13.0  11.5 - 15.5 %   Platelets 255  150 - 400 K/uL   Comment: DELTA CHECK NOTED     REPEATED TO VERIFY  POTASSIUM     Status: None   Collection Time    04/02/13  4:56 AM      Result Value Range    Potassium 3.8  3.7 - 5.3 mEq/L  CREATININE, SERUM     Status: None   Collection Time    04/02/13  4:56 AM      Result Value Range  Creatinine, Ser 0.85  0.50 - 1.35 mg/dL   GFR calc non Af Amer >90  >90 mL/min   GFR calc Af Amer >90  >90 mL/min   Comment: (NOTE)     The eGFR has been calculated using the CKD EPI equation.     This calculation has not been validated in all clinical situations.     eGFR's persistently <90 mL/min signify possible Chronic Kidney     Disease.    Imaging / Studies: No results found.  Medications / Allergies: per chart  Antibiotics: Anti-infectives   Start     Dose/Rate Route Frequency Ordered Stop   03/30/13 1700  ertapenem (INVANZ) 1 g in sodium chloride 0.9 % 50 mL IVPB     1 g 100 mL/hr over 30 Minutes Intravenous Every 24 hours 03/29/13 1730     03/29/13 1700  ertapenem (INVANZ) 1 g in sodium chloride 0.9 % 50 mL IVPB  Status:  Discontinued     1 g 100 mL/hr over 30 Minutes Intravenous Daily 03/29/13 1602 03/29/13 1740       Note: This dictation was prepared with Dragon/digital dictation along with [employer]. Any transcriptional errors that result from this process are unintentional.

## 2013-04-02 NOTE — Procedures (Signed)
Procedure:  CT guided drainage of abdominal fluid collection Findings:  Anterior peritoneal collection aspirated with 18 G needle yielding clear fluid.  12 Fr drain placed over wire.  Drain could not be formed in collection and went through back wall of fluid collection. Collection aspirated as drain was retracted.  Total fluid removed was 10 mL.  Collection completely decompressed.  Drain removed.

## 2013-04-02 NOTE — Consult Note (Signed)
Agree 

## 2013-04-02 NOTE — Consult Note (Signed)
HPI: Dakota Vang is an 47 y.o. male with diverticulitis and small abd abscesses on today's CT. IR requested to perc aspirate/drain if possible. Images reviewed. PMHx, and meds reviewed.  Past Medical History:  Past Medical History  Diagnosis Date  . GERD (gastroesophageal reflux disease)   . Colon polyps 2008    Dr Stark  . Diverticulosis 2002    Dr [place]  . Hyperlipidemia   . Diverticulitis     PMH of  . ADD (attention deficit disorder with hyperactivity)     Dr. Arfien  . Anxiety     Dr. Arfien  . Hypertension   . Diverticulitis of sigmoid colon 06/24/2008    2005 by CT scan    . Pancolonic diverticulosis 09/09/2012  . COLONIC POLYPS, HX OF 11/20/2007    Qualifier: Diagnosis of  By: Hopper MD, William   Last colonoscopy negative 2013 PMH of plyps X 2; Dr Stark, GI    . Duodenal diverticulum 08/28/2012  . Diverticulitis of jejunum with microperforation s/p lap resection July2014 08/28/2012    Qualifier: Diagnosis of  By: Hopper MD, William   6/1-3/14 [facility] : ? Jejunal perforation F/U CT recommended 08/24/12     Past Surgical History:  Past Surgical History  Procedure Laterality Date  . Tonsillectomy    . Lasik      bilaterally  . Colonoscopy w/ polypectomy  2002 & 2008    Dr. Stark; polyps X 2  . Colonoscopy  2013    Dr Stark  . Laparoscopic partial colectomy N/A 09/24/2012    Procedure: laparoscopic assisted  resection of proximal jejunum containing the jejunal diverticulum;  Surgeon: Steven C. Gross, MD;  Location: [facility];  Service: General;  Laterality: N/A;  . Laparoscopic small bowel resection  09/24/2012    excision of proximal jejunum for jejunal diverticulitis    Family History:  Family History  Problem Relation Age of Onset  . Colon cancer Father     in 50s  . Lung cancer Father     smoker; asbestos exposure  . Cancer Father     Mesothelioma  . Coronary artery disease Mother     CAGB X 2 & angio 3-4X  . Cancer Mother      Gallbladder  . Coronary artery disease Maternal Grandmother   . COPD Maternal Grandmother   . Heart attack Maternal Grandfather     late 60's  . Heart attack Paternal Grandfather 69  . Diabetes Neg Hx   . Stroke Neg Hx     Social History:  reports that he has been smoking Cigarettes.  He has been smoking about 0.00 packs per day. He has never used smokeless tobacco. He reports that he drinks about 3.6 ounces of alcohol per week. He reports that he does not use illicit drugs.  Allergies:  Allergies  Allergen Reactions  . Penicillins Other (See Comments)    ? Reaction @ 48 yrs old    Medications:   Medication List    ASK your doctor about these medications       amLODipine 5 MG tablet  Commonly known as:  NORVASC  Take 1 tablet (5 mg total)  by mouth daily.     aspirin EC 81 MG tablet  Take 81 mg by mouth daily.     buPROPion 300 MG 24 hr tablet  Commonly known as:  WELLBUTRIN XL  Take 1 tablet (300 mg total) by mouth daily with breakfast.       carvedilol 6.25 MG tablet  Commonly known as:  COREG  Take 1 tablet (6.25 mg total) by mouth 2 (two) times daily with a meal. 1 by mouth twice daily     lamoTRIgine 150 MG tablet  Commonly known as:  LAMICTAL  Take 1 tablet by mouth  twice a day     methylphenidate 20 MG tablet  Commonly known as:  RITALIN  Take one in AM and one at NOON     omeprazole 20 MG capsule  Commonly known as:  PRILOSEC  Take 1 capsule by mouth  daily     ramipril 10 MG capsule  Commonly known as:  ALTACE  Take 1 capsule (10 mg  total) by mouth daily.     rosuvastatin 20 MG tablet  Commonly known as:  CRESTOR  Take 20 mg by mouth 4 (four) times a week. Take on Sunday Monday Wednesday and Friday.        Please HPI for pertinent positives, otherwise complete 10 system ROS negative.  Physical Exam: BP 150/83  Pulse 85  Temp(Src) 98.2 F (36.8 C) (Oral)  Resp 18  Ht 6' 1" (1.854 m)  Wt 210 lb (95.255 kg)  BMI 27.71 kg/m2  SpO2 96%  Body mass index is 27.71 kg/(m^2).   General Appearance:  Alert, cooperative, no distress, appears stated age  Head:  Normocephalic, without obvious abnormality, atraumatic  ENT: Unremarkable  Neck: Supple, symmetrical, trachea midline  Lungs:   Clear to auscultation bilaterally, no w/r/r, respirations unlabored without use of accessory muscles.  Chest Wall:  No tenderness or deformity  Heart:  Regular rate and rhythm, S1, S2 normal, no murmur, rub or gallop.  Abdomen:   Soft, non-tender, non distended.  Extremities: Extremities normal, atraumatic, no cyanosis or edema  Pulses: 2+ and symmetric  Neurologic: Normal affect, no gross deficits.   Results for orders placed during the hospital encounter of 03/29/13 (from the past 48 hour(s))  CBC     Status: Abnormal   Collection Time    04/01/13  5:00 AM      Result Value Range   WBC 13.1 (*) 4.0 - 10.5 K/uL   RBC 4.10 (*) 4.22 - 5.81 MIL/uL   Hemoglobin 12.2 (*) 13.0 - 17.0 g/dL   HCT 35.9 (*) 39.0 - 52.0 %   MCV 87.6  78.0 - 100.0 fL   MCH 29.8  26.0 - 34.0 pg   MCHC 34.0  30.0 - 36.0 g/dL   RDW 12.9  11.5 - 15.5 %   Platelets 183  150 - 400 K/uL  POTASSIUM     Status: None   Collection Time    04/01/13  5:00 AM      Result Value Range   Potassium 4.0  3.7 - 5.3 mEq/L  CREATININE, SERUM     Status: None   Collection Time    04/01/13  5:00 AM      Result Value Range   Creatinine, Ser 0.85  0.50 - 1.35 mg/dL   GFR calc non Af Amer >90  >90 mL/min   GFR calc Af Amer >90  >90 mL/min   Comment: (NOTE)     The eGFR has been calculated using the CKD EPI equation.     This calculation has not been validated in all clinical situations.     eGFR's persistently <90 mL/min signify possible Chronic Kidney     Disease.  CBC     Status: Abnormal   Collection Time      04/02/13  4:56 AM      Result Value Range   WBC 10.0  4.0 - 10.5 K/uL   RBC 4.29  4.22 - 5.81 MIL/uL   Hemoglobin 12.7 (*) 13.0 - 17.0 g/dL   HCT 37.4 (*) 39.0 - 52.0 %    MCV 87.2  78.0 - 100.0 fL   MCH 29.6  26.0 - 34.0 pg   MCHC 34.0  30.0 - 36.0 g/dL   RDW 13.0  11.5 - 15.5 %   Platelets 255  150 - 400 K/uL   Comment: DELTA CHECK NOTED     REPEATED TO VERIFY  POTASSIUM     Status: None   Collection Time    04/02/13  4:56 AM      Result Value Range   Potassium 3.8  3.7 - 5.3 mEq/L  CREATININE, SERUM     Status: None   Collection Time    04/02/13  4:56 AM      Result Value Range   Creatinine, Ser 0.85  0.50 - 1.35 mg/dL   GFR calc non Af Amer >90  >90 mL/min   GFR calc Af Amer >90  >90 mL/min   Comment: (NOTE)     The eGFR has been calculated using the CKD EPI equation.     This calculation has not been validated in all clinical situations.     eGFR's persistently <90 mL/min signify possible Chronic Kidney     Disease.   Ct Abdomen Pelvis W Contrast  04/02/2013   CLINICAL DATA:  Sigmoid diverticulitis, lower abdominal pain  EXAM: CT ABDOMEN AND PELVIS WITH CONTRAST  TECHNIQUE: Multidetector CT imaging of the abdomen and pelvis was performed using the standard protocol following bolus administration of intravenous contrast.  CONTRAST:  151m OMNIPAQUE IOHEXOL 300 MG/ML  SOLN  COMPARISON:  03/29/2013  FINDINGS: Minor basilar atelectasis. No lower lobe pneumonia. No pericardial or pleural effusion. Normal heart size.  Abdomen: Increased pneumoperitoneum beneath the right hemidiaphragm overlying the liver. Slight interval progression of acute sigmoid diverticulitis in the lower mid abdomen anteriorly. There is increased inflammation and edema within the surrounding mesenteric fat at the site of diverticulitis. In this region, there is also increased entrapped air within the mesentery. At least 3 small fluid collections are now present compatible with small intra-abdominal abscesses. One small developing abscess measures 2.8 cm, image 50. A second small abscess measures 18 mm, image 62. A third small air-fluid collection just beneath the anterior abdominal  wall measures 3.1 cm, image 64.  Liver, gallbladder, biliary system, pancreas, spleen, adrenal glands, and kidneys are within normal limits for age and demonstrate no acute process. Incidental calcified splenic granulomata noted.  Negative for bowel obstruction, dilatation, or ileus. Incidental duodenum diverticulum noted with an air-fluid level, image 39.  Atherosclerosis of the aorta without aneurysm  Pelvis: Normal appendix. No significant pelvic free fluid, additional fluid collection, hemorrhage, or adenopathy. Small fat containing inguinal hernias bilaterally. Urinary bladder unremarkable. Pelvic calcifications consistent with venous phleboliths.  Degenerative changes noted of the spine.  IMPRESSION: Interval worsening sigmoid diverticulitis in the lower abdomen anteriorly with increased pericolonic edema and inflammation.  Slight increased pneumoperitoneum over the liver as well as entrapped air in the mesentery at the site of diverticulitis  Three small developing mesenteric fluid collections compatible with intra-abdominal abscesses.  These results were called by telephone at the time of interpretation on 04/02/2013 at 12:44 PM to Dr. SMichael Boston, who verbally acknowledged these results.   Electronically Signed  By: Daryll Brod M.D.   On: 04/02/2013 12:45    Assessment/Plan Diverticulitis with abscess CT guided drainage/aspiration Explained procedure, risks, complications, use of sedation. Labs reviewed. Consent signed in chart  Ascencion Dike PA-C 04/02/2013, 2:02 PM

## 2013-04-03 LAB — CBC
HEMATOCRIT: 35.6 % — AB (ref 39.0–52.0)
HEMOGLOBIN: 12 g/dL — AB (ref 13.0–17.0)
MCH: 29.6 pg (ref 26.0–34.0)
MCHC: 33.7 g/dL (ref 30.0–36.0)
MCV: 87.7 fL (ref 78.0–100.0)
Platelets: 250 10*3/uL (ref 150–400)
RBC: 4.06 MIL/uL — ABNORMAL LOW (ref 4.22–5.81)
RDW: 13.1 % (ref 11.5–15.5)
WBC: 7.7 10*3/uL (ref 4.0–10.5)

## 2013-04-03 LAB — CREATININE, SERUM
CREATININE: 0.76 mg/dL (ref 0.50–1.35)
GFR calc Af Amer: >90 mL/min (ref >90)
GFR calc non Af Amer: >90 mL/min (ref >90)

## 2013-04-03 LAB — POTASSIUM: Potassium: 4.3 mEq/L (ref 3.7–5.3)

## 2013-04-03 NOTE — Discharge Instructions (Signed)
Diverticulitis °A diverticulum is a small pouch or sac on the colon. Diverticulosis is the presence of these diverticula on the colon. Diverticulitis is the irritation (inflammation) or infection of diverticula. °CAUSES  °The colon and its diverticula contain bacteria. If food particles block the tiny opening to a diverticulum, the bacteria inside can grow and cause an increase in pressure. This leads to infection and inflammation and is called diverticulitis. °SYMPTOMS  °· Abdominal pain and tenderness. Usually, the pain is located on the left side of your abdomen. However, it could be located elsewhere. °· Fever. °· Bloating. °· Feeling sick to your stomach (nausea). °· Throwing up (vomiting). °· Abnormal stools. °DIAGNOSIS  °Your caregiver will take a history and perform a physical exam. Since many things can cause abdominal pain, other tests may be necessary. Tests may include: °· Blood tests. °· Urine tests. °· X-ray of the abdomen. °· CT scan of the abdomen. °Sometimes, surgery is needed to determine if diverticulitis or other conditions are causing your symptoms. °TREATMENT  °Most of the time, you can be treated without surgery. Treatment includes: °· Resting the bowels by only having liquids for a few days. As you improve, you will need to eat a low-fiber diet. °· Intravenous (IV) fluids if you are losing body fluids (dehydrated). °· Antibiotic medicines that treat infections may be given. °· Pain and nausea medicine, if needed. °· Surgery if the inflamed diverticulum has burst. °HOME CARE INSTRUCTIONS  °· Try a clear liquid diet (broth, tea, or water for as long as directed by your caregiver). You may then gradually begin a low-fiber diet as tolerated.  °A low-fiber diet is a diet with less than 10 grams of fiber. Choose the foods below to reduce fiber in the diet: °· White breads, cereals, rice, and pasta. °· Cooked fruits and vegetables or soft fresh fruits and vegetables without the skin. °· Ground or  well-cooked tender beef, ham, veal, lamb, pork, or poultry. °· Eggs and seafood. °· After your diverticulitis symptoms have improved, your caregiver may put you on a high-fiber diet. A high-fiber diet includes 14 grams of fiber for every 1000 calories consumed. For a standard 2000 calorie diet, you would need 28 grams of fiber. Follow these diet guidelines to help you increase the fiber in your diet. It is important to slowly increase the amount fiber in your diet to avoid gas, constipation, and bloating. °· Choose whole-grain breads, cereals, pasta, and brown rice. °· Choose fresh fruits and vegetables with the skin on. Do not overcook vegetables because the more vegetables are cooked, the more fiber is lost. °· Choose more nuts, seeds, legumes, dried peas, beans, and lentils. °· Look for food products that have greater than 3 grams of fiber per serving on the Nutrition Facts label. °· Take all medicine as directed by your caregiver. °· If your caregiver has given you a follow-up appointment, it is very important that you go. Not going could result in lasting (chronic) or permanent injury, pain, and disability. If there is any problem keeping the appointment, call to reschedule. °SEEK MEDICAL CARE IF:  °· Your pain does not improve. °· You have a hard time advancing your diet beyond clear liquids. °· Your bowel movements do not return to normal. °SEEK IMMEDIATE MEDICAL CARE IF:  °· Your pain becomes worse. °· You have an oral temperature above 102° F (38.9° C), not controlled by medicine. °· You have repeated vomiting. °· You have bloody or black, tarry stools. °·   Symptoms that brought you to your caregiver become worse or are not getting better. °MAKE SURE YOU:  °· Understand these instructions. °· Will watch your condition. °· Will get help right away if you are not doing well or get worse. °Document Released: 12/05/2004 Document Revised: 05/20/2011 Document Reviewed: 04/02/2010 °[employer]® Patient Information  ©2014 [employer]. ° °

## 2013-04-03 NOTE — Progress Notes (Signed)
Patient ID: Dakota Vang, male   DOB: 1966-01-29, 48 y.o.   MRN: [id number]  General Surgery - [facility] - Progress Note  Subjective: Patient in good spirits.  Wife at bedside.  No pain.  Tolerating full liquids.  Wants to go home.  Objective: Vital signs in last 24 hours: Temp:  [98.3 F (36.8 C)-98.6 F (37 C)] 98.6 F (37 C) (01/24 0526) Pulse Rate:  [71-90] 79 (01/24 0526) Resp:  [6-18] 16 (01/24 0526) BP: (107-150)/(67-95) 111/67 mmHg (01/24 0526) SpO2:  [96 %-100 %] 98 % (01/24 0526) Last BM Date: 03/29/13  Intake/Output from previous day: 01/23 0701 - 01/24 0700 In: 900 [I.V.:900] Out: 1100 [Urine:1100]  Exam: HEENT - clear, not icteric Neck - soft Chest - clear bilaterally Cor - RRR, no murmur Abd - soft without distension; minimal tenderness, no mass; BS present Ext - no significant edema Neuro - grossly intact, no focal deficits  Lab Results:   Recent Labs  04/02/13 0456 04/03/13 0440  WBC 10.0 7.7  HGB 12.7* 12.0*  HCT 37.4* 35.6*  PLT 255 250     Recent Labs  04/02/13 0456 04/03/13 0440  K 3.8 4.3  CREATININE 0.85 0.76    Studies/Results: Ct Abdomen Pelvis W Contrast  04/02/2013   CLINICAL DATA:  Sigmoid diverticulitis, lower abdominal pain  EXAM: CT ABDOMEN AND PELVIS WITH CONTRAST  TECHNIQUE: Multidetector CT imaging of the abdomen and pelvis was performed using the standard protocol following bolus administration of intravenous contrast.  CONTRAST:  164mL OMNIPAQUE IOHEXOL 300 MG/ML  SOLN  COMPARISON:  03/29/2013  FINDINGS: Minor basilar atelectasis. No lower lobe pneumonia. No pericardial or pleural effusion. Normal heart size.  Abdomen: Increased pneumoperitoneum beneath the right hemidiaphragm overlying the liver. Slight interval progression of acute sigmoid diverticulitis in the lower mid abdomen anteriorly. There is increased inflammation and edema within the surrounding mesenteric fat at the site of diverticulitis. In  this region, there is also increased entrapped air within the mesentery. At least 3 small fluid collections are now present compatible with small intra-abdominal abscesses. One small developing abscess measures 2.8 cm, image 50. A second small abscess measures 18 mm, image 62. A third small air-fluid collection just beneath the anterior abdominal wall measures 3.1 cm, image 64.  Liver, gallbladder, biliary system, pancreas, spleen, adrenal glands, and kidneys are within normal limits for age and demonstrate no acute process. Incidental calcified splenic granulomata noted.  Negative for bowel obstruction, dilatation, or ileus. Incidental duodenum diverticulum noted with an air-fluid level, image 39.  Atherosclerosis of the aorta without aneurysm  Pelvis: Normal appendix. No significant pelvic free fluid, additional fluid collection, hemorrhage, or adenopathy. Small fat containing inguinal hernias bilaterally. Urinary bladder unremarkable. Pelvic calcifications consistent with venous phleboliths.  Degenerative changes noted of the spine.  IMPRESSION: Interval worsening sigmoid diverticulitis in the lower abdomen anteriorly with increased pericolonic edema and inflammation.  Slight increased pneumoperitoneum over the liver as well as entrapped air in the mesentery at the site of diverticulitis  Three small developing mesenteric fluid collections compatible with intra-abdominal abscesses.  These results were called by telephone at the time of interpretation on 04/02/2013 at 12:44 PM to Dr. Royston Boston , who verbally acknowledged these results.   Electronically Signed   By: Daryll Brod M.D.   On: 04/02/2013 12:45   Ct Image Guided Drainage By Percutaneous Catheter  04/02/2013   CLINICAL DATA:  Diverticulitis with new peritoneal fluid collections.  EXAM: CT GUIDED CATHETER DRAINAGE  OF PERITONEAL ABSCESS  ANESTHESIA/SEDATION: 4.0 Mg IV Versed 200 mcg IV Fentanyl  Total Moderate Sedation Time:  20 minutes  PROCEDURE:  The procedure, risks, benefits, and alternatives were explained to the patient. Questions regarding the procedure were encouraged and answered. The patient understands and consents to the procedure.  The abdominal wall was prepped with Betadine in a sterile fashion, and a sterile drape was applied covering the operative field. A sterile gown and sterile gloves were used for the procedure. Local anesthesia was provided with 1% Lidocaine.  CT was performed in a supine position. Midline peritoneal collection was localized. An 18 gauge trocar needle was advanced. Aspiration of fluid was performed. A guidewire was advanced. The tract was dilated. A 12 French drainage catheter was advanced over the wire. Catheter manipulation was then performed as aspiration was performed through the catheter. The catheter was removed.  COMPLICATIONS: None  FINDINGS: The anterior midline collection with air-fluid level was targeted and is located just below the umbilicus. Initial aspiration yielded clear, straw-colored fluid. During placement of the drainage catheter, the drainage catheter exited the left posterolateral wall of the collection. Further aspiration drainage was performed through the drainage catheter as the catheter was withdrawn. This ultimately resulted in near complete collapse of the collection with total yielded of approximately 10 mL of blood tinged and clear fluid. The drainage catheter was removed on completion.  IMPRESSION: CT-guided drainage of anterior peritoneal fluid collection. A total of 10 mL of clear and blood tinged fluid was removed. As above, a drain was not left in place as initial drain placement extended through the collection and near total collapse of the collection was performed through the drain.   Electronically Signed   By: Aletta Edouard M.D.   On: 04/02/2013 17:47    Assessment / Plan: 1.  Diverticulitis with small abscess  Aspiration by IR yesterday, no drain placed  Tolerating diet -  meets criteria for discharge per Dr. Johney Maine' note  Will advance to low residue diet and ask dietician to instruct  Likely home later today on po abx's  Follow up with Dr. Johney Maine at [place] office  Earnstine Regal, MD, [facility] Office: [phone]  04/03/2013

## 2013-04-05 NOTE — Telephone Encounter (Signed)
LMOM giving pt a 2 wk f/u appt with Dr Johney Maine for 04/23/13 arrive at 10:00/10:15 to see Dr Johney Maine in the office after being discharged from the hospital.

## 2013-04-05 NOTE — H&P (Signed)
I have interviewed and examined this patient. I agree with the assessment and treatment plan outlined by Mr. [name], [place].  Dakota Vang. Dalbert Batman, M.D., [facility] General and Minimally invasive Surgery Breast and Colorectal Surgery Office:   [phone] Pager:   [phone]

## 2013-04-06 LAB — CULTURE, ROUTINE-ABSCESS
CULTURE: NO GROWTH
Gram Stain: NONE SEEN
Special Requests: NORMAL

## 2013-04-07 ENCOUNTER — Encounter (INDEPENDENT_AMBULATORY_CARE_PROVIDER_SITE_OTHER): Payer: Self-pay | Admitting: Surgery

## 2013-04-07 ENCOUNTER — Encounter: Payer: Self-pay | Admitting: Internal Medicine

## 2013-04-07 LAB — ANAEROBIC CULTURE
GRAM STAIN: NONE SEEN
SPECIAL REQUESTS: NORMAL

## 2013-04-07 NOTE — Discharge Summary (Signed)
The patient recovering.  Complex sigmoid diverticulitis.  We will follow closely.  Anticipate need for elective sigmoid resection to prevent further attacks In March once the current attack has resolved.

## 2013-04-07 NOTE — Discharge Summary (Signed)
Physician Discharge Summary  Patient ID: LAYDON MARTIS MRN: [id number] DOB/AGE: 1965-08-24 48 y.o.  Admit date: 03/29/2013 Discharge date: 04/07/2013  Admission Diagnoses:  1. Sigmoid Diverticulitis with microperforation  2. S/P laparoscopic assisted resection of the proximal jejunum on 09/24/13, by Dr. Johney Maine  3. Pancolonic diverticulosis  4. Hypertension  5. ADD hyperactive/Mood disorder/Anxiety  6. GERD  7. Hx of recurrent constipation  8. Hyperlipidemia  9. Body mass index is 27.7 Wife reports 30 pound weight loss since July.  Discharge Diagnoses:  1. Sigmoid Diverticulitis with microperforation  2. S/P laparoscopic assisted resection of the proximal jejunum on 09/24/13, by Dr. Johney Maine  3. Pancolonic diverticulosis  4. Hypertension  5. ADD hyperactive/Mood disorder/Anxiety  6. GERD  7. Hx of recurrent constipation  8. Hyperlipidemia  9. Body mass index is 27.7 Wife reports 30 pound weight loss since July.  Principal Problem:   Diverticulitis of colon with perforation Active Problems:   ADD   HYPERTENSION   HEMORRHOIDS, INTERNAL   G E R D   COLONIC POLYPS, HX OF   Constipation   Diverticulitis of jejunum with microperforation s/p lap resection [id number]   Pancolonic diverticulosis   PROCEDURES: None  Hospital Course:  Pt is a 48 y/o with recurrent diverticulitis and S/p laparoscopic assisted resection of the proximal jejunum on 09/24/12, by Dr. Johney Maine. He did well till about a week ago when he started having pain. He took some Cipro Dr. Johney Maine had given him, (11/11-11/13/15,) after his last discharge. He felt better after 3 days till last PM he woke up with pain. He had some constipation again before this all restarted, followed by some diarrhea, it seems this was better, he had a normal BM. He complains of dull aching persistent pain mid lower abdomen. Pain medicine isn't working, he has fever up to 100.5, and WBC up to 20,000.  CT scan shows: Moderate diverticulitis is seen  involving the mid sigmoid colon. A tiny amount of extraluminal air is seen in sigmoid mesocolon. A tiny amount of free intraperitoneal air is seen along the anterior margin of the left hepatic lobe. No evidence of abscess or free fluid. No evidence of bowel obstruction. Normal appendix is visualized. We were ask to see and admitted him for recurrent diverticulitis.  He was placed on IV antibiotics, and made very slow progress.  He was seen and followed by Dr. Johney Maine after his admission.  He was ready for oral antibiotics and discharge on 04/03/13.  IR attempted placing a drain and aspirated 10 ml on 04/02/13.  No growth from the fluid on culture.   He will follow up with Dr. Johney Maine  Condition on d/c:  Improved.   Disposition: 01-Home or Self Care  Discharge Orders   Future Appointments Provider Department Dept Phone   04/23/2013 10:15 AM [name], MD [facility], [place] [phone]   Future Orders Complete By Expires   Diet - low sodium heart healthy  As directed    Increase activity slowly  As directed    No wound care  As directed        Medication List         amLODipine 5 MG tablet  Commonly known as:  NORVASC  Take 1 tablet (5 mg total)  by mouth daily.     aspirin EC 81 MG tablet  Take 81 mg by mouth daily.     buPROPion 300 MG 24 hr tablet  Commonly known as:  WELLBUTRIN XL  Take  1 tablet (300 mg total) by mouth daily with breakfast.     carvedilol 6.25 MG tablet  Commonly known as:  COREG  Take 1 tablet (6.25 mg total) by mouth 2 (two) times daily with a meal. 1 by mouth twice daily     ciprofloxacin 500 MG tablet  Commonly known as:  CIPRO  Take 1 tablet (500 mg total) by mouth 2 (two) times daily.     fluconazole 200 MG tablet  Commonly known as:  DIFLUCAN  Take 1 tablet (200 mg total) by mouth daily.     lamoTRIgine 150 MG tablet  Commonly known as:  LAMICTAL  Take 1 tablet by mouth  twice a day     methylphenidate 20 MG tablet  Commonly known  as:  RITALIN  Take one in AM and one at NOON     metroNIDAZOLE 500 MG tablet  Commonly known as:  FLAGYL  Take 1 tablet (500 mg total) by mouth 3 (three) times daily.     naproxen 500 MG tablet  Commonly known as:  NAPROSYN  Take 1 tablet (500 mg total) by mouth 2 (two) times daily with a meal.     omeprazole 20 MG capsule  Commonly known as:  PRILOSEC  Take 1 capsule by mouth  daily     oxyCODONE 5 MG immediate release tablet  Commonly known as:  Oxy IR/ROXICODONE  Take 1-2 tablets (5-10 mg total) by mouth every 4 (four) hours as needed for moderate pain, severe pain or breakthrough pain.     ramipril 10 MG capsule  Commonly known as:  ALTACE  Take 1 capsule (10 mg  total) by mouth daily.     rosuvastatin 20 MG tablet  Commonly known as:  CRESTOR  Take 20 mg by mouth 4 (four) times a week. Take on Sunday Monday Wednesday and Friday.           Follow-up Information   Follow up with GROSS,STEVEN C., MD. Schedule an appointment as soon as possible for a visit in 2 weeks.   Specialty:  General Surgery   Contact information:   [address] [place] [address] [phone]       Signed: Earnstine Regal 04/07/2013, 11:46 AM

## 2013-04-08 ENCOUNTER — Telehealth (INDEPENDENT_AMBULATORY_CARE_PROVIDER_SITE_OTHER): Payer: Self-pay

## 2013-04-08 ENCOUNTER — Other Ambulatory Visit (INDEPENDENT_AMBULATORY_CARE_PROVIDER_SITE_OTHER): Payer: Self-pay | Admitting: Surgery

## 2013-04-08 DIAGNOSIS — K5732 Diverticulitis of large intestine without perforation or abscess without bleeding: Secondary | ICD-10-CM

## 2013-04-08 MED ORDER — OXYCODONE HCL 5 MG PO TABS: 5.0000 mg | ORAL_TABLET | ORAL | Status: DC | PRN

## 2013-04-08 NOTE — Telephone Encounter (Signed)
[facility] notifying pt that he has written rx for Oxycodone 50m #40 written by Dr Donne Hazel for Dr Johney Maine.

## 2013-04-15 ENCOUNTER — Telehealth (INDEPENDENT_AMBULATORY_CARE_PROVIDER_SITE_OTHER): Payer: Self-pay

## 2013-04-15 DIAGNOSIS — K5732 Diverticulitis of large intestine without perforation or abscess without bleeding: Secondary | ICD-10-CM

## 2013-04-15 MED ORDER — OXYCODONE HCL 5 MG PO TABS: 5.0000 mg | ORAL_TABLET | ORAL | Status: DC | PRN

## 2013-04-15 NOTE — Telephone Encounter (Signed)
LMOM that we have a written rx for Oxycodone 5mg  #40 at the front desk for p/u.

## 2013-04-23 ENCOUNTER — Ambulatory Visit (INDEPENDENT_AMBULATORY_CARE_PROVIDER_SITE_OTHER): Payer: 59 | Admitting: Surgery

## 2013-04-23 ENCOUNTER — Encounter (INDEPENDENT_AMBULATORY_CARE_PROVIDER_SITE_OTHER): Payer: Self-pay | Admitting: Surgery

## 2013-04-23 VITALS — BP 120/86 | HR 100 | Temp 99.5°F | Resp 16 | Ht 73.0 in | Wt 198.6 lb

## 2013-04-23 DIAGNOSIS — K5732 Diverticulitis of large intestine without perforation or abscess without bleeding: Secondary | ICD-10-CM

## 2013-04-23 DIAGNOSIS — K5720 Diverticulitis of large intestine with perforation and abscess without bleeding: Secondary | ICD-10-CM

## 2013-04-23 DIAGNOSIS — K5730 Diverticulosis of large intestine without perforation or abscess without bleeding: Secondary | ICD-10-CM

## 2013-04-23 NOTE — Progress Notes (Signed)
Subjective:     Patient ID: Dakota Vang, male   DOB: 1965/12/22, 48 y.o.   MRN: [id number]  HPI  Note: This dictation was prepared with Dragon/digital dictation along with [employer]. Any transcriptional errors that result from this process are unintentional.       Dakota Vang  07/05/1965 [id number]  Patient Care Team: Hendricks Limes, MD as PCP - General Ladene Artist, MD as Consulting Physician (Gastroenterology) Kathlee Nations, MD as Consulting Physician (Psychiatry) [name], MD as Consulting Physician (General Surgery)  This patient returns for surgical re-evaluation.  He had a flare of sigmoid diverticulitis requiring IV antibiotics.  He is convinced it started after eating a peanut [employer].  He claims he gets colon diverticulitis flares after eating peanuts.  He thinks this is his third attack or nothing coming to the emergency room.  The first 2 times he went home on oral antibiotics and improved and 2005 and 2010.  At this time he required to be admitted.  He also developed air and fluid collections around this.  It was aspirated.  Gradually improved on IV antibiotics.  He went home.  He has completed oral Cipro/Flagyl/fluconazole.  He notes he has burning and pain with urination, but is gradually fading away.  No hematuria.  No pneumaturia.  Lower abdominal discomfort is fading away as well.  Bowels become more normal.  Tolerating solid foods.  He comes today with his wife.  Energy still rather down.  Low grade "fevers" around 99-100.  He is hoping that I would not recommend surgery.  He is hoping never to get any more attacks.  He had many questions.  Patient Active Problem List   Diagnosis Date Noted  . Diverticulitis of colon with perforation 03/29/2013  . Pancolonic diverticulosis 09/09/2012  . Diverticulitis of jejunum with microperforation s/p lap resection ID:[id number] 08/28/2012  . Constipation 08/27/2012  . ADD 11/20/2007  . COLONIC POLYPS, HX  OF 11/20/2007  . HYPERLIPIDEMIA 06/12/2007  . HYPERTENSION 06/12/2007  . HEMORRHOIDS, INTERNAL 11/27/2006  . G E R D 09/19/2006    Past Medical History  Diagnosis Date  . GERD (gastroesophageal reflux disease)   . Colon polyps 2008    Dr Fuller Plan  . Diverticulosis 2002    Dr Velora Heckler  . Hyperlipidemia   . Diverticulitis     PMH of  . ADD (attention deficit disorder with hyperactivity)     Dr. Donalee Citrin  . Anxiety     Dr. Donalee Citrin  . Hypertension   . Diverticulitis of sigmoid colon 06/24/2008    2005 by CT scan    . Pancolonic diverticulosis 09/09/2012  . COLONIC POLYPS, HX OF 11/20/2007    Qualifier: Diagnosis of  By: Linna Darner MD, Gwyndolyn Saxon   Last colonoscopy negative 2013 PMH of plyps X 2; Dr Fuller Plan, GI    . Duodenal diverticulum 08/28/2012  . Diverticulitis of jejunum with microperforation s/p lap resection ID:[id number] 08/28/2012    Qualifier: Diagnosis of  By: Linna Darner MD, Gwyndolyn Saxon   6/1-3/14 [facility] : ? Jejunal perforation F/U CT recommended 08/24/12     Past Surgical History  Procedure Laterality Date  . Tonsillectomy    . Lasik      bilaterally  . Colonoscopy w/ polypectomy  2002 & 2008    Dr. Fuller Plan; polyps X 2  . Colonoscopy  2013    Dr Fuller Plan  . Laparoscopic partial colectomy N/A 09/24/2012    Procedure: laparoscopic assisted  resection  of proximal jejunum containing the jejunal diverticulum;  Surgeon: [name], MD;  Location: [facility] ORS;  Service: General;  Laterality: N/A;  . Laparoscopic small bowel resection  09/24/2012    excision of proximal jejunum for jejunal diverticulitis    History   Social History  . Marital Status: Married    Spouse Name: N/A    Number of Children: N/A  . Years of Education: N/A   Occupational History  . Not on file.   Social History Main Topics  . Smoking status: Current Some Day Smoker    Types: Cigarettes  . Smokeless tobacco: Never Used     Comment: 1 cigar / month; previously up to 1/2 ppd for 5 years  .  Alcohol Use: 3.6 - 4.8 oz/week    6-8 Cans of beer per week     Comment: Beer on weekend   . Drug Use: No  . Sexual Activity: Not on file   Other Topics Concern  . Not on file   Social History Narrative  . No narrative on file    Family History  Problem Relation Age of Onset  . Colon cancer Father     in 42s  . Lung cancer Father     smoker; asbestos exposure  . Cancer Father     Mesothelioma  . Coronary artery disease Mother     CAGB X 2 & angio 3-4X  . Cancer Mother     Gallbladder  . Coronary artery disease Maternal Grandmother   . COPD Maternal Grandmother   . Heart attack Maternal Grandfather     late 60's  . Heart attack Paternal Grandfather 68  . Diabetes Neg Hx   . Stroke Neg Hx     Current Outpatient Prescriptions  Medication Sig Dispense Refill  . amLODipine (NORVASC) 5 MG tablet Take 1 tablet (5 mg total)  by mouth daily.  90 tablet  0  . aspirin EC 81 MG tablet Take 81 mg by mouth daily.      Marland Kitchen buPROPion (WELLBUTRIN XL) 300 MG 24 hr tablet Take 1 tablet (300 mg total) by mouth daily with breakfast.  90 tablet  0  . carvedilol (COREG) 6.25 MG tablet Take 1 tablet (6.25 mg total) by mouth 2 (two) times daily with a meal. 1 by mouth twice daily  180 tablet  1  . methylphenidate (RITALIN) 20 MG tablet Take one in AM and one at NOON  60 tablet  0  . naproxen (NAPROSYN) 500 MG tablet Take 1 tablet (500 mg total) by mouth 2 (two) times daily with a meal.  40 tablet  1  . omeprazole (PRILOSEC) 20 MG capsule Take 1 capsule by mouth  daily  90 capsule  1  . oxyCODONE (OXY IR/ROXICODONE) 5 MG immediate release tablet Take 1-2 tablets (5-10 mg total) by mouth every 4 (four) hours as needed for moderate pain, severe pain or breakthrough pain.  40 tablet  0  . ramipril (ALTACE) 10 MG capsule Take 1 capsule (10 mg  total) by mouth daily.  90 capsule  1  . rosuvastatin (CRESTOR) 20 MG tablet Take 20 mg by mouth 4 (four) times a week. Take on Sunday Monday Wednesday and  Friday.       No current facility-administered medications for this visit.     Allergies  Allergen Reactions  . Penicillins Other (See Comments)    ? Reaction @ 48 yrs old    BP 120/86  Pulse 100  Temp(Src) 99.5 F (37.5 C) (Oral)  Resp 16  Ht 6\' 1"  (1.854 m)  Wt 198 lb 9.6 oz (90.084 kg)  BMI 26.21 kg/m2  Ct Abdomen Pelvis W Contrast  04/02/2013   CLINICAL DATA:  Sigmoid diverticulitis, lower abdominal pain  EXAM: CT ABDOMEN AND PELVIS WITH CONTRAST  TECHNIQUE: Multidetector CT imaging of the abdomen and pelvis was performed using the standard protocol following bolus administration of intravenous contrast.  CONTRAST:  119mL OMNIPAQUE IOHEXOL 300 MG/ML  SOLN  COMPARISON:  03/29/2013  FINDINGS: Minor basilar atelectasis. No lower lobe pneumonia. No pericardial or pleural effusion. Normal heart size.  Abdomen: Increased pneumoperitoneum beneath the right hemidiaphragm overlying the liver. Slight interval progression of acute sigmoid diverticulitis in the lower mid abdomen anteriorly. There is increased inflammation and edema within the surrounding mesenteric fat at the site of diverticulitis. In this region, there is also increased entrapped air within the mesentery. At least 3 small fluid collections are now present compatible with small intra-abdominal abscesses. One small developing abscess measures 2.8 cm, image 50. A second small abscess measures 18 mm, image 62. A third small air-fluid collection just beneath the anterior abdominal wall measures 3.1 cm, image 64.  Liver, gallbladder, biliary system, pancreas, spleen, adrenal glands, and kidneys are within normal limits for age and demonstrate no acute process. Incidental calcified splenic granulomata noted.  Negative for bowel obstruction, dilatation, or ileus. Incidental duodenum diverticulum noted with an air-fluid level, image 39.  Atherosclerosis of the aorta without aneurysm  Pelvis: Normal appendix. No significant pelvic free fluid,  additional fluid collection, hemorrhage, or adenopathy. Small fat containing inguinal hernias bilaterally. Urinary bladder unremarkable. Pelvic calcifications consistent with venous phleboliths.  Degenerative changes noted of the spine.  IMPRESSION: Interval worsening sigmoid diverticulitis in the lower abdomen anteriorly with increased pericolonic edema and inflammation.  Slight increased pneumoperitoneum over the liver as well as entrapped air in the mesentery at the site of diverticulitis  Three small developing mesenteric fluid collections compatible with intra-abdominal abscesses.  These results were called by telephone at the time of interpretation on 04/02/2013 at 12:44 PM to Dr. Reid Boston , who verbally acknowledged these results.   Electronically Signed   By: Daryll Brod M.D.   On: 04/02/2013 12:45   Ct Abdomen Pelvis W Contrast  03/29/2013   CLINICAL DATA:  Lower abdominal and pelvic pain.  Diverticulitis.  EXAM: CT ABDOMEN AND PELVIS WITH CONTRAST  TECHNIQUE: Multidetector CT imaging of the abdomen and pelvis was performed using the standard protocol following bolus administration of intravenous contrast.  CONTRAST:  132mL OMNIPAQUE IOHEXOL 300 MG/ML  SOLN  COMPARISON:  09/07/2012  FINDINGS: The abdominal parenchymal organs are normal in appearance. No evidence hydronephrosis. No soft tissue masses or lymphadenopathy identified within the abdomen or pelvis.  Moderate diverticulitis is seen involving the mid sigmoid colon. A tiny amount of extraluminal air is seen in sigmoid mesocolon. A tiny amount of free intraperitoneal air is seen along the anterior margin of the left hepatic lobe. No evidence of abscess or free fluid. No evidence of bowel obstruction. Normal appendix is visualized.  IMPRESSION: Moderate sigmoid diverticulitis, with tiny amount of free intraperitoneal air noted consistent with intraperitoneal perforation.  No evidence of abscess or bowel obstruction.  Critical Value/emergent  results were called by telephone at the time of interpretation on 03/29/2013 at 3:23 PM to Dr. Joseph Berkshire , who verbally acknowledged these results.   Electronically Signed   By: Earle Gell M.D.   On:  03/29/2013 15:26   Ct Image Guided Drainage By Percutaneous Catheter  04/02/2013   CLINICAL DATA:  Diverticulitis with new peritoneal fluid collections.  EXAM: CT GUIDED CATHETER DRAINAGE OF PERITONEAL ABSCESS  ANESTHESIA/SEDATION: 4.0 Mg IV Versed 200 mcg IV Fentanyl  Total Moderate Sedation Time:  20 minutes  PROCEDURE: The procedure, risks, benefits, and alternatives were explained to the patient. Questions regarding the procedure were encouraged and answered. The patient understands and consents to the procedure.  The abdominal wall was prepped with Betadine in a sterile fashion, and a sterile drape was applied covering the operative field. A sterile gown and sterile gloves were used for the procedure. Local anesthesia was provided with 1% Lidocaine.  CT was performed in a supine position. Midline peritoneal collection was localized. An 18 gauge trocar needle was advanced. Aspiration of fluid was performed. A guidewire was advanced. The tract was dilated. A 12 French drainage catheter was advanced over the wire. Catheter manipulation was then performed as aspiration was performed through the catheter. The catheter was removed.  COMPLICATIONS: None  FINDINGS: The anterior midline collection with air-fluid level was targeted and is located just below the umbilicus. Initial aspiration yielded clear, straw-colored fluid. During placement of the drainage catheter, the drainage catheter exited the left posterolateral wall of the collection. Further aspiration drainage was performed through the drainage catheter as the catheter was withdrawn. This ultimately resulted in near complete collapse of the collection with total yielded of approximately 10 mL of blood tinged and clear fluid. The drainage catheter was  removed on completion.  IMPRESSION: CT-guided drainage of anterior peritoneal fluid collection. A total of 10 mL of clear and blood tinged fluid was removed. As above, a drain was not left in place as initial drain placement extended through the collection and near total collapse of the collection was performed through the drain.   Electronically Signed   By: Aletta Edouard M.D.   On: 04/02/2013 17:47    Review of Systems  Constitutional: Positive for fever, appetite change, fatigue and unexpected weight change. Negative for chills and diaphoresis.  HENT: Negative for sore throat and trouble swallowing.   Eyes: Negative for photophobia and visual disturbance.  Respiratory: Negative for choking and shortness of breath.   Cardiovascular: Negative for chest pain and palpitations.  Gastrointestinal: Positive for abdominal pain. Negative for nausea, vomiting, diarrhea, constipation, blood in stool, abdominal distention, anal bleeding and rectal pain.  Genitourinary: Negative for dysuria, urgency, difficulty urinating and testicular pain.  Musculoskeletal: Negative for arthralgias, gait problem, myalgias and neck pain.  Skin: Negative for color change and rash.  Neurological: Negative for dizziness, speech difficulty, weakness and numbness.  Hematological: Negative for adenopathy.  Psychiatric/Behavioral: Negative for hallucinations, confusion and agitation. The patient is nervous/anxious.        Objective:   Physical Exam  Constitutional: He is oriented to person, place, and time. He appears well-developed and well-nourished. No distress.  HENT:  Head: Normocephalic.  Mouth/Throat: Oropharynx is clear and moist. No oropharyngeal exudate.  Eyes: Conjunctivae and EOM are normal. Pupils are equal, round, and reactive to light. No scleral icterus.  Neck: Normal range of motion. No tracheal deviation present.  Cardiovascular: Normal rate, normal heart sounds and intact distal pulses.     Pulmonary/Chest: Effort normal. No respiratory distress.  Abdominal: Soft. He exhibits no distension. There is tenderness in the left lower quadrant. There is no rigidity, no rebound, no guarding, no tenderness at McBurney's point and negative Murphy's sign. No hernia.  Hernia confirmed negative in the ventral area, confirmed negative in the right inguinal area and confirmed negative in the left inguinal area.    Incisions clean with normal healing ridges.  No hernias  Musculoskeletal: Normal range of motion. He exhibits no tenderness.  Neurological: He is alert and oriented to person, place, and time. No cranial nerve deficit. He exhibits normal muscle tone. Coordination normal.  Skin: Skin is warm and dry. No rash noted. He is not diaphoretic.  Psychiatric: He has a normal mood and affect. His behavior is normal.       Assessment:     Severe sigmoid diverticulitis with microperforation and abscesses.  Eventually improve with possible admission, IV antibiotics, aspiration of fluid.  Pandiverticulosis.  No evidence of diverticulitis outside of the sigmoid colon.  History of a symptomatic duodenal diverticulosis.  History of perforated jejunal diverticulitis status post small bowel resection.     Plan:     I spent some time going over the anatomy and physiology of the digestive tract and pathophysiology of diverticulitis.  Because he has had at least 3 sigmoid diverticulitis attacks, in this last one being severe with perforation and abscess; I suspect he will benefit from elective resection to remove the colon before he gets another attack.  Very likely he will get another attack.  He is convinced that if he stops peanuts he will get no more attacks.  While I think it makes sense to avoid peanuts since then has been obvious triggering the past,  I cautioned him against that that is the only thing he will need to ensure no more attacks.  If I can resect the chronically inflamed area, he is  much less likely to get a new attack.  The next attack may be mild or it may be life threatening, requiring emergency surgery and a colostomy.  He wishes to avoid another operation.  He wished to avoid another ostomy.  That being said, and he wants me around in case surgery is needed.  I discussed surgery with him and his wife:  The anatomy & physiology of the digestive tract was discussed.  The pathophysiology of the colon was discussed.  Natural history risks without surgery was discussed.   I feel the risks of no intervention will lead to serious problems that outweigh the operative risks; therefore, I recommended a partial colectomy to remove the pathology.  Minimally invasive (Robotic/Laparoscopic) & open techniques were discussed.   Risks such as bleeding, infection, abscess, leak, reoperation, possible ostomy, hernia, heart attack, death, and other risks were discussed.  I noted a good likelihood this will help address the problem.   Goals of post-operative recovery were discussed as well.   Need for bowel regimen and healthy physical activity to optimize recovery noted as well. We will work to minimize complications.  Educational materials were given as well.  Questions were answered.  The patient expresses understanding & wishes to proceed with surgery.  At this point, he will think about things and let me know.  Recently do minimally invasive resection if he agrees to it.  I recommend he get a colonoscopy since it has been more than 2 years and he has no symptoms.  He is interested in proceeding with this.  I would wait at least until 6 weeks past his most recent attack, leaving it in late March less early April.  His wife seemed convinced that he needs to have surgery.  We will see what the patient thinks.  Discussed with my partner, Dr. Brantley Stage.

## 2013-04-23 NOTE — Patient Instructions (Signed)
Please consider the recommendations that we have given you today:  Resume a low fat high fiber diet.  He makes sense to continue to avoid peanuts.  Consider a surgery to remove in your sigmoid colon that has the diverticulitis before you get another life-threatening attack.  Consider a colonoscopy to evaluate your colon in late March, more than 6 weeks from this attack, to make sure you do not have a cancer or other problem.  See the Handout(s) we have given you.  Please call our office at [phone] if you wish to schedule surgery or if you have further questions / concerns.   Diverticulitis A diverticulum is a small pouch or sac on the colon. Diverticulosis is the presence of these diverticula on the colon. Diverticulitis is the irritation (inflammation) or infection of diverticula. CAUSES  The colon and its diverticula contain bacteria. If food particles block the tiny opening to a diverticulum, the bacteria inside can grow and cause an increase in pressure. This leads to infection and inflammation and is called diverticulitis. SYMPTOMS   Abdominal pain and tenderness. Usually, the pain is located on the left side of your abdomen. However, it could be located elsewhere.  Fever.  Bloating.  Feeling sick to your stomach (nausea).  Throwing up (vomiting).  Abnormal stools. DIAGNOSIS  Your caregiver will take a history and perform a physical exam. Since many things can cause abdominal pain, other tests may be necessary. Tests may include:  Blood tests.  Urine tests.  X-ray of the abdomen.  CT scan of the abdomen. Sometimes, surgery is needed to determine if diverticulitis or other conditions are causing your symptoms. TREATMENT  Most of the time, you can be treated without surgery. Treatment includes:  Resting the bowels by only having liquids for a few days. As you improve, you will need to eat a low-fiber diet.  Intravenous (IV) fluids if you are losing body fluids  (dehydrated).  Antibiotic medicines that treat infections may be given.  Pain and nausea medicine, if needed.  Surgery if the inflamed diverticulum has burst. HOME CARE INSTRUCTIONS   Try a clear liquid diet (broth, tea, or water for as long as directed by your caregiver). You may then gradually begin a low-fiber diet as tolerated.  A low-fiber diet is a diet with less than 10 grams of fiber. Choose the foods below to reduce fiber in the diet:  White breads, cereals, rice, and pasta.  Cooked fruits and vegetables or soft fresh fruits and vegetables without the skin.  Ground or well-cooked tender beef, ham, veal, lamb, pork, or poultry.  Eggs and seafood.  After your diverticulitis symptoms have improved, your caregiver may put you on a high-fiber diet. A high-fiber diet includes 14 grams of fiber for every 1000 calories consumed. For a standard 2000 calorie diet, you would need 28 grams of fiber. Follow these diet guidelines to help you increase the fiber in your diet. It is important to slowly increase the amount fiber in your diet to avoid gas, constipation, and bloating.  Choose whole-grain breads, cereals, pasta, and brown rice.  Choose fresh fruits and vegetables with the skin on. Do not overcook vegetables because the more vegetables are cooked, the more fiber is lost.  Choose more nuts, seeds, legumes, dried peas, beans, and lentils.  Look for food products that have greater than 3 grams of fiber per serving on the Nutrition Facts label.  Take all medicine as directed by your caregiver.  If your caregiver  has given you a follow-up appointment, it is very important that you go. Not going could result in lasting (chronic) or permanent injury, pain, and disability. If there is any problem keeping the appointment, call to reschedule. SEEK MEDICAL CARE IF:   Your pain does not improve.  You have a hard time advancing your diet beyond clear liquids.  Your bowel movements do  not return to normal. SEEK IMMEDIATE MEDICAL CARE IF:   Your pain becomes worse.  You have an oral temperature above 102 F (38.9 C), not controlled by medicine.  You have repeated vomiting.  You have bloody or black, tarry stools.  Symptoms that brought you to your caregiver become worse or are not getting better. MAKE SURE YOU:   Understand these instructions.  Will watch your condition.  Will get help right away if you are not doing well or get worse. Document Released: 12/05/2004 Document Revised: 05/20/2011 Document Reviewed: 04/02/2010 [facility] Patient Information 2014 [place].  GETTING TO GOOD BOWEL HEALTH. Irregular bowel habits such as constipation and diarrhea can lead to many problems over time.  Having one soft bowel movement a day is the most important way to prevent further problems.  The anorectal canal is designed to handle stretching and feces to safely manage our ability to get rid of solid waste (feces, poop, stool) out of our body.  BUT, hard constipated stools can act like ripping concrete bricks and diarrhea can be a burning fire to this very sensitive area of our body, causing inflamed hemorrhoids, anal fissures, increasing risk is perirectal abscesses, abdominal pain/bloating, an making irritable bowel worse.     The goal: ONE SOFT BOWEL MOVEMENT A DAY!  To have soft, regular bowel movements:    Drink at least 8 tall glasses of water a day.     Take plenty of fiber.  Fiber is the undigested part of plant food that passes into the colon, acting s "natures broom" to encourage bowel motility and movement.  Fiber can absorb and hold large amounts of water. This results in a larger, bulkier stool, which is soft and easier to pass. Work gradually over several weeks up to 6 servings a day of fiber (25g a day even more if needed) in the form of: o Vegetables -- Root (potatoes, carrots, turnips), leafy green (lettuce, salad greens, celery, spinach), or cooked high  residue (cabbage, broccoli, etc) o Fruit -- Fresh (unpeeled skin & pulp), Dried (prunes, apricots, cherries, etc ),  or stewed ( applesauce)  o Whole grain breads, pasta, etc (whole wheat)  o Bran cereals    Bulking Agents -- This type of water-retaining fiber generally is easily obtained each day by one of the following:  o Psyllium bran -- The psyllium plant is remarkable because its ground seeds can retain so much water. This product is available as Metamucil, Konsyl, Effersyllium, Per Diem Fiber, or the less expensive generic preparation in drug and health food stores. Although labeled a laxative, it really is not a laxative.  o Methylcellulose -- This is another fiber derived from wood which also retains water. It is available as Citrucel. o Polyethylene Glycol - and "artificial" fiber commonly called Miralax or Glycolax.  It is helpful for people with gassy or bloated feelings with regular fiber o Flax Seed - a less gassy fiber than psyllium   No reading or other relaxing activity while on the toilet. If bowel movements take longer than 5 minutes, you are too constipated   AVOID  CONSTIPATION.  High fiber and water intake usually takes care of this.  Sometimes a laxative is needed to stimulate more frequent bowel movements, but    Laxatives are not a good long-term solution as it can wear the colon out. o Osmotics (Milk of Magnesia, Fleets phosphosoda, Magnesium citrate, MiraLax, GoLytely) are safer than  o Stimulants (Senokot, Castor Oil, Dulcolax, Ex Lax)    o Do not take laxatives for more than 7days in a row.    IF SEVERELY CONSTIPATED, try a Bowel Retraining Program: o Do not use laxatives.  o Eat a diet high in roughage, such as bran cereals and leafy vegetables.  o Drink six (6) ounces of prune or apricot juice each morning.  o Eat two (2) large servings of stewed fruit each day.  o Take one (1) heaping tablespoon of a psyllium-based bulking agent twice a day. Use sugar-free sweetener  when possible to avoid excessive calories.  o Eat a normal breakfast.  o Set aside 15 minutes after breakfast to sit on the toilet, but do not strain to have a bowel movement.  o If you do not have a bowel movement by the third day, use an enema and repeat the above steps.    Controlling diarrhea o Switch to liquids and simpler foods for a few days to avoid stressing your intestines further. o Avoid dairy products (especially milk & ice cream) for a short time.  The intestines often can lose the ability to digest lactose when stressed. o Avoid foods that cause gassiness or bloating.  Typical foods include beans and other legumes, cabbage, broccoli, and dairy foods.  Every person has some sensitivity to other foods, so listen to our body and avoid those foods that trigger problems for you. o Adding fiber (Citrucel, Metamucil, psyllium, Miralax) gradually can help thicken stools by absorbing excess fluid and retrain the intestines to act more normally.  Slowly increase the dose over a few weeks.  Too much fiber too soon can backfire and cause cramping & bloating. o Probiotics (such as active yogurt, Align, etc) may help repopulate the intestines and colon with normal bacteria and calm down a sensitive digestive tract.  Most studies show it to be of mild help, though, and such products can be costly. o Medicines:   Bismuth subsalicylate (ex. Kayopectate, Pepto Bismol) every 30 minutes for up to 6 doses can help control diarrhea.  Avoid if pregnant.   Loperamide (Immodium) can slow down diarrhea.  Start with two tablets (4mg  total) first and then try one tablet every 6 hours.  Avoid if you are having fevers or severe pain.  If you are not better or start feeling worse, stop all medicines and call your doctor for advice o Call your doctor if you are getting worse or not better.  Sometimes further testing (cultures, endoscopy, X-ray studies, bloodwork, etc) may be needed to help diagnose and treat the cause  of the diarrhea. o

## 2013-05-02 ENCOUNTER — Other Ambulatory Visit: Payer: Self-pay | Admitting: Internal Medicine

## 2013-05-02 DIAGNOSIS — I10 Essential (primary) hypertension: Secondary | ICD-10-CM

## 2013-05-02 DIAGNOSIS — K219 Gastro-esophageal reflux disease without esophagitis: Secondary | ICD-10-CM

## 2013-05-03 ENCOUNTER — Telehealth (INDEPENDENT_AMBULATORY_CARE_PROVIDER_SITE_OTHER): Payer: Self-pay | Admitting: *Deleted

## 2013-05-03 ENCOUNTER — Other Ambulatory Visit (INDEPENDENT_AMBULATORY_CARE_PROVIDER_SITE_OTHER): Payer: Self-pay | Admitting: Surgery

## 2013-05-03 ENCOUNTER — Other Ambulatory Visit: Payer: Self-pay | Admitting: Internal Medicine

## 2013-05-03 DIAGNOSIS — K5732 Diverticulitis of large intestine without perforation or abscess without bleeding: Secondary | ICD-10-CM

## 2013-05-03 NOTE — Telephone Encounter (Signed)
Refill for Altace and Prilosec sent to [facility]

## 2013-05-03 NOTE — Telephone Encounter (Signed)
Received an electronic request from the patient asking for a refill of Oxycodone IR 5mg  tablets.  Patient received the prescription last on 04/15/13 #40.  Please advise as to what is appropriate for this patient.

## 2013-05-04 MED ORDER — OXYCODONE HCL 5 MG PO TABS: 5.0000 mg | ORAL_TABLET | ORAL | Status: DC | PRN

## 2013-05-04 NOTE — Telephone Encounter (Signed)
OK to refill one more time.  Oxy 1-2 q6h prn pain #40

## 2013-05-04 NOTE — Telephone Encounter (Signed)
LMOM for pt to p/u Oxycodone 5/325mg  #40 signed by Dr Ninfa Linden for Dr Johney Maine. I advised pt that this would be the last rf for the Oxycodone per Dr Johney Maine.

## 2013-05-05 MED ORDER — AMLODIPINE BESYLATE 5 MG PO TABS: ORAL_TABLET | ORAL | Status: DC

## 2013-05-05 NOTE — Telephone Encounter (Signed)
Rx sent to the pharmacy by e-script.  Pt needs complete physical.//AB/CMA 

## 2013-05-17 ENCOUNTER — Other Ambulatory Visit (HOSPITAL_COMMUNITY): Payer: Self-pay | Admitting: Psychiatry

## 2013-05-17 ENCOUNTER — Telehealth: Payer: Self-pay | Admitting: Internal Medicine

## 2013-05-17 DIAGNOSIS — F988 Other specified behavioral and emotional disorders with onset usually occurring in childhood and adolescence: Secondary | ICD-10-CM

## 2013-05-17 DIAGNOSIS — F39 Unspecified mood [affective] disorder: Secondary | ICD-10-CM

## 2013-05-17 MED ORDER — METHYLPHENIDATE HCL 20 MG PO TABS: ORAL_TABLET | ORAL | Status: DC

## 2013-05-17 NOTE — Telephone Encounter (Signed)
Patient called and stated he would like refills for all his medications.   Pharmacy [facility], [place]

## 2013-05-18 NOTE — Telephone Encounter (Signed)
Spoke with the pt and he stated that he saw on MyChart that he had on refills on his Carvediolo 6.25mg .  He stated that Dr. Linna Darner started him on this at his last visit and he was told to take it bid, but he stopped taking it bid and is only taking it once a day.  Informed the pt that he needs a follow-up appt with Dr. Linna Darner regarding his BP and his meds since he stop taking it bid.  He said he wanted to get off some of his meds.  Informed the pt to call back and schedule an appt for a BP follow-up and to discuss meds.  Pt agreed and said he will call back.//AB/CMA

## 2013-05-26 NOTE — Telephone Encounter (Signed)
Prescription was still at the front desk on 05/26/13 so is being shredded at this time.

## 2013-05-28 ENCOUNTER — Ambulatory Visit (INDEPENDENT_AMBULATORY_CARE_PROVIDER_SITE_OTHER): Payer: 59 | Admitting: Internal Medicine

## 2013-05-28 ENCOUNTER — Encounter: Payer: Self-pay | Admitting: Internal Medicine

## 2013-05-28 VITALS — BP 120/90 | HR 91 | Temp 98.4°F | Resp 12 | Wt 210.6 lb

## 2013-05-28 DIAGNOSIS — I10 Essential (primary) hypertension: Secondary | ICD-10-CM

## 2013-05-28 MED ORDER — CARVEDILOL 3.125 MG PO TABS: 3.1250 mg | ORAL_TABLET | Freq: Two times a day (BID) | ORAL | Status: DC

## 2013-05-28 NOTE — Progress Notes (Signed)
Pre visit review using our clinic review tool, if applicable. No additional management support is needed unless otherwise documented below in the visit note. 

## 2013-05-28 NOTE — Patient Instructions (Signed)

## 2013-05-28 NOTE — Progress Notes (Signed)
   Subjective:    Patient ID: Dakota Vang, male    DOB: 1965/08/31, 48 y.o.   MRN: [id number]  HPI Blood pressure range / average : 120 to 130 / 80  He did stop taking Carvedilol (6.5mg  BID) two weeks ago .Several days later he felt like his heart was "skipping a beat every once in a while."  He started taking Carvedilol one time per day 3 days ago and has felt well since without any feelings of skipped beats. No lightheadedness or other adverse medication effect described.  A heart healthy /low salt diet not followed, but he is modifying his diet significantly to avoid recurrence of diverticulitis. Exercise encompasses his daily work, which requires him to walk a lot.  He is able to do this without symptoms.  Family history is signifiant for MI (MGF @ late 33's, and PGF @69 ) / Negative for CVA hx.   Review of Systems  Significant headaches, epistaxis, chest pain, exertional dyspnea, claudication, paroxysmal nocturnal dyspnea, or edema absent.     Objective:   Physical Exam Appears healthy and well-nourished & in no acute distress  No carotid bruits are present.No neck pain distention present at 10 - 15 degrees. Thyroid normal to palpation  Heart rhythm and rate are normal with no gallop or murmur  Chest is clear with no increased work of breathing  There is no evidence of aortic aneurysm or renal artery bruits  Abdomen soft with no organomegaly or masses. No HJR  No clubbing, cyanosis or edema present.  Pedal pulses are intact   No ischemic skin changes are present . Fingernails healthy   Alert and oriented. Strength, tone normal          Assessment & Plan:  #1 HTN controlled Half life of Carvedilol discussed; bid dosing recommended

## 2013-05-31 ENCOUNTER — Telehealth: Payer: Self-pay | Admitting: Internal Medicine

## 2013-05-31 NOTE — Telephone Encounter (Signed)
Relevant patient education assigned to patient using Emmi. ° °

## 2013-06-14 ENCOUNTER — Ambulatory Visit (HOSPITAL_COMMUNITY): Payer: Self-pay | Admitting: Psychiatry

## 2013-06-21 ENCOUNTER — Ambulatory Visit (INDEPENDENT_AMBULATORY_CARE_PROVIDER_SITE_OTHER): Payer: 59 | Admitting: Psychiatry

## 2013-06-21 ENCOUNTER — Encounter (HOSPITAL_COMMUNITY): Payer: Self-pay | Admitting: Psychiatry

## 2013-06-21 VITALS — BP 134/88 | HR 92 | Ht 73.0 in | Wt 216.2 lb

## 2013-06-21 DIAGNOSIS — F988 Other specified behavioral and emotional disorders with onset usually occurring in childhood and adolescence: Secondary | ICD-10-CM

## 2013-06-21 DIAGNOSIS — F39 Unspecified mood [affective] disorder: Secondary | ICD-10-CM

## 2013-06-21 MED ORDER — METHYLPHENIDATE HCL 20 MG PO TABS: ORAL_TABLET | ORAL | Status: DC

## 2013-06-21 MED ORDER — BUPROPION HCL ER (XL) 300 MG PO TB24: ORAL_TABLET | ORAL | Status: DC

## 2013-06-21 MED ORDER — LAMOTRIGINE 150 MG PO TABS: 150.0000 mg | ORAL_TABLET | Freq: Two times a day (BID) | ORAL | Status: DC

## 2013-06-21 NOTE — Progress Notes (Signed)
[facility] Progress Note  Dakota Vang [id number] 48 y.o.  06/21/2013 10:07 AM  Chief Complaint:  Medication management and followup.      History of Present Illness:  Dakota Vang came for his followup appointment.  He is compliant with his medication.  He has been very busy at his work.  He denies any agitation, anger or any mood swing.  His attention and focus is good.  His pressure is also well controlled and recently one of his antihypertensive has been reduced.  Patient does not drink or use any illegal substances.  He was to continue his current psychotropic medication.  He explained that he is able to do multitasking.  Suicidal Ideation: No Plan Formed: No Patient has means to carry out plan: No  Homicidal Ideation: No Plan Formed: No Patient has means to carry out plan: No  Review of Systems: Psychiatric: Agitation: No Hallucination: No Depressed Mood: No Insomnia: No Hypersomnia: Yes Altered Concentration: No Feels Worthless: No Grandiose Ideas: No Belief In Special Powers: No New/Increased Substance Abuse: No Compulsions: No  Neurologic: Headache: No Seizure: No Paresthesias: No  Medical History. Patient has multiple medical problems including diverticulitis, hyperlipidemia, hypertension, back pain and GERD.  His primary care physician is Unice Cobble .    Family and Social History: Patient endorse mother has depression and history of psychiatric inpatient treatment.  Patient is born and raised in [place].  He has a good childhood.  He lives with his wife and his 3 children.  He has good social network.  He is employed .  He denies any history of abuse in the past.  Alcohol and substance use history. Patient admitted history of DWI and drinking alcohol in the past however claims to be sober since he is taking Ritalin.  Outpatient Encounter Prescriptions as of 06/21/2013  Medication Sig  . amLODipine (NORVASC) 5 MG tablet PT NEEDS COMPLETE  PHYSICAL.  TAKE 1 TABLET BY MOUTH DAILY.  Marland Kitchen aspirin EC 81 MG tablet Take 81 mg by mouth daily.  Marland Kitchen buPROPion (WELLBUTRIN XL) 300 MG 24 hr tablet Take 1 tablet by mouth  daily with breakfast  . carvedilol (COREG) 3.125 MG tablet Take 1 tablet (3.125 mg total) by mouth 2 (two) times daily with a meal.  . lamoTRIgine (LAMICTAL) 150 MG tablet Take 1 tablet (150 mg total) by mouth 2 (two) times daily.  . methylphenidate (RITALIN) 20 MG tablet Take one in AM and one at [facility]  . omeprazole (PRILOSEC) 20 MG capsule Take 1 capsule by mouth   daily  . ramipril (ALTACE) 10 MG capsule Take 1 capsule (10 mg   total) by mouth daily.  . rosuvastatin (CRESTOR) 20 MG tablet Take 20 mg by mouth 4 (four) times a week. Take on Sunday Monday Wednesday and Friday.  . [DISCONTINUED] buPROPion (WELLBUTRIN XL) 300 MG 24 hr tablet Take 1 tablet by mouth  daily with breakfast  . [DISCONTINUED] lamoTRIgine (LAMICTAL) 150 MG tablet Take 1 tablet by mouth 2 (two) times daily.  . [DISCONTINUED] methylphenidate (RITALIN) 20 MG tablet Take one in AM and one at [facility]  . [DISCONTINUED] methylphenidate (RITALIN) 20 MG tablet Take one in AM and one at [facility]    Past Psychiatric History/Hospitalization(s): Anxiety: Yes Bipolar Disorder: No Depression: Patient has history of depression and mood swing for a long time.  He start taking psychotropic medication by his primary care physician Dr. Unice Cobble.  In the past he was given Prozac but limited response.  Mania: No Psychosis: No Schizophrenia: No Personality Disorder: No Hospitalization for psychiatric illness: No History of Electroconvulsive Shock Therapy: No Prior Suicide Attempts: No  Physical Exam: Constitutional:  BP 134/88  Pulse 92  Ht _0  (1.854 m)  Wt 216 lb 3.2 oz (98.068 kg)  BMI 28.53 kg/m2  General Appearance: alert, oriented, no acute distress and well nourished  Recent Results (from the past 2160 hour(s))  URINALYSIS, ROUTINE W REFLEX MICROSCOPIC      Status: None   Collection Time    03/29/13  1:37 PM      Result Value Ref Range   Color, Urine YELLOW  YELLOW   APPearance CLEAR  CLEAR   Specific Gravity, Urine 1.021  1.005 - 1.030   pH 5.5  5.0 - 8.0   Glucose, UA NEGATIVE  NEGATIVE mg/dL   Hgb urine dipstick NEGATIVE  NEGATIVE   Bilirubin Urine NEGATIVE  NEGATIVE   Ketones, ur NEGATIVE  NEGATIVE mg/dL   Protein, ur NEGATIVE  NEGATIVE mg/dL   Urobilinogen, UA 0.2  0.0 - 1.0 mg/dL   Nitrite NEGATIVE  NEGATIVE   Leukocytes, UA NEGATIVE  NEGATIVE   Comment: MICROSCOPIC NOT DONE ON URINES WITH NEGATIVE PROTEIN, BLOOD, LEUKOCYTES, NITRITE, OR GLUCOSE <1000 mg/dL.  CBC WITH DIFFERENTIAL     Status: Abnormal   Collection Time    03/29/13  1:50 PM      Result Value Ref Range   WBC 20.7 (*) 4.0 - 10.5 K/uL   RBC 5.06  4.22 - 5.81 MIL/uL   Hemoglobin 15.2  13.0 - 17.0 g/dL   HCT 43.3  39.0 - 52.0 %   MCV 85.6  78.0 - 100.0 fL   MCH 30.0  26.0 - 34.0 pg   MCHC 35.1  30.0 - 36.0 g/dL   RDW 12.5  11.5 - 15.5 %   Platelets 230  150 - 400 K/uL   Neutrophils Relative % 86 (*) 43 - 77 %   Neutro Abs 17.9 (*) 1.7 - 7.7 K/uL   Lymphocytes Relative 7 (*) 12 - 46 %   Lymphs Abs 1.5  0.7 - 4.0 K/uL   Monocytes Relative 6  3 - 12 %   Monocytes Absolute 1.3 (*) 0.1 - 1.0 K/uL   Eosinophils Relative 0  0 - 5 %   Eosinophils Absolute 0.0  0.0 - 0.7 K/uL   Basophils Relative 0  0 - 1 %   Basophils Absolute 0.1  0.0 - 0.1 K/uL  COMPREHENSIVE METABOLIC PANEL     Status: Abnormal   Collection Time    03/29/13  1:50 PM      Result Value Ref Range   Sodium 137  137 - 147 mEq/L   Potassium 4.2  3.7 - 5.3 mEq/L   Chloride 100  96 - 112 mEq/L   CO2 22  19 - 32 mEq/L   Glucose, Bld 107 (*) 70 - 99 mg/dL   BUN 14  6 - 23 mg/dL   Creatinine, Ser 0.77  0.50 - 1.35 mg/dL   Calcium 9.5  8.4 - 10.5 mg/dL   Total Protein 7.2  6.0 - 8.3 g/dL   Albumin 4.2  3.5 - 5.2 g/dL   AST 14  0 - 37 U/L   ALT 20  0 - 53 U/L   Alkaline Phosphatase 87  39 - 117  U/L   Total Bilirubin 0.3  0.3 - 1.2 mg/dL   GFR calc non Af Amer >90  >90 mL/min  GFR calc Af Amer >90  >90 mL/min   Comment: (NOTE)     The eGFR has been calculated using the CKD EPI equation.     This calculation has not been validated in all clinical situations.     eGFR's persistently <90 mL/min signify possible Chronic Kidney     Disease.  LIPASE, BLOOD     Status: None   Collection Time    03/29/13  1:50 PM      Result Value Ref Range   Lipase 43  11 - 59 U/L  BASIC METABOLIC PANEL     Status: Abnormal   Collection Time    03/30/13  4:34 AM      Result Value Ref Range   Sodium 136 (*) 137 - 147 mEq/L   Potassium 4.2  3.7 - 5.3 mEq/L   Chloride 100  96 - 112 mEq/L   CO2 26  19 - 32 mEq/L   Glucose, Bld 118 (*) 70 - 99 mg/dL   BUN 11  6 - 23 mg/dL   Creatinine, Ser 0.94  0.50 - 1.35 mg/dL   Calcium 8.7  8.4 - 10.5 mg/dL   GFR calc non Af Amer >90  >90 mL/min   GFR calc Af Amer >90  >90 mL/min   Comment: (NOTE)     The eGFR has been calculated using the CKD EPI equation.     This calculation has not been validated in all clinical situations.     eGFR's persistently <90 mL/min signify possible Chronic Kidney     Disease.  CBC     Status: Abnormal   Collection Time    03/30/13  4:34 AM      Result Value Ref Range   WBC 16.9 (*) 4.0 - 10.5 K/uL   RBC 4.19 (*) 4.22 - 5.81 MIL/uL   Hemoglobin 12.9 (*) 13.0 - 17.0 g/dL   HCT 36.5 (*) 39.0 - 52.0 %   MCV 87.1  78.0 - 100.0 fL   MCH 30.8  26.0 - 34.0 pg   MCHC 35.3  30.0 - 36.0 g/dL   RDW 13.0  11.5 - 15.5 %   Platelets 207  150 - 400 K/uL  CBC     Status: Abnormal   Collection Time    03/31/13  4:20 AM      Result Value Ref Range   WBC 15.6 (*) 4.0 - 10.5 K/uL   RBC 4.09 (*) 4.22 - 5.81 MIL/uL   Hemoglobin 12.1 (*) 13.0 - 17.0 g/dL   HCT 36.2 (*) 39.0 - 52.0 %   MCV 88.5  78.0 - 100.0 fL   MCH 29.6  26.0 - 34.0 pg   MCHC 33.4  30.0 - 36.0 g/dL   RDW 12.9  11.5 - 15.5 %   Platelets 193  150 - 400 K/uL   POTASSIUM     Status: None   Collection Time    03/31/13  4:20 AM      Result Value Ref Range   Potassium 4.1  3.7 - 5.3 mEq/L  CREATININE, SERUM     Status: None   Collection Time    03/31/13  4:20 AM      Result Value Ref Range   Creatinine, Ser 0.91  0.50 - 1.35 mg/dL   GFR calc non Af Amer >90  >90 mL/min   GFR calc Af Amer >90  >90 mL/min   Comment: (NOTE)     The eGFR has been calculated using the  CKD EPI equation.     This calculation has not been validated in all clinical situations.     eGFR's persistently <90 mL/min signify possible Chronic Kidney     Disease.  CBC     Status: Abnormal   Collection Time    04/01/13  5:00 AM      Result Value Ref Range   WBC 13.1 (*) 4.0 - 10.5 K/uL   RBC 4.10 (*) 4.22 - 5.81 MIL/uL   Hemoglobin 12.2 (*) 13.0 - 17.0 g/dL   HCT 35.9 (*) 39.0 - 52.0 %   MCV 87.6  78.0 - 100.0 fL   MCH 29.8  26.0 - 34.0 pg   MCHC 34.0  30.0 - 36.0 g/dL   RDW 12.9  11.5 - 15.5 %   Platelets 183  150 - 400 K/uL  POTASSIUM     Status: None   Collection Time    04/01/13  5:00 AM      Result Value Ref Range   Potassium 4.0  3.7 - 5.3 mEq/L  CREATININE, SERUM     Status: None   Collection Time    04/01/13  5:00 AM      Result Value Ref Range   Creatinine, Ser 0.85  0.50 - 1.35 mg/dL   GFR calc non Af Amer >90  >90 mL/min   GFR calc Af Amer >90  >90 mL/min   Comment: (NOTE)     The eGFR has been calculated using the CKD EPI equation.     This calculation has not been validated in all clinical situations.     eGFR's persistently <90 mL/min signify possible Chronic Kidney     Disease.  CBC     Status: Abnormal   Collection Time    04/02/13  4:56 AM      Result Value Ref Range   WBC 10.0  4.0 - 10.5 K/uL   RBC 4.29  4.22 - 5.81 MIL/uL   Hemoglobin 12.7 (*) 13.0 - 17.0 g/dL   HCT 37.4 (*) 39.0 - 52.0 %   MCV 87.2  78.0 - 100.0 fL   MCH 29.6  26.0 - 34.0 pg   MCHC 34.0  30.0 - 36.0 g/dL   RDW 13.0  11.5 - 15.5 %   Platelets 255  150 - 400 K/uL    Comment: DELTA CHECK NOTED     REPEATED TO VERIFY  POTASSIUM     Status: None   Collection Time    04/02/13  4:56 AM      Result Value Ref Range   Potassium 3.8  3.7 - 5.3 mEq/L  CREATININE, SERUM     Status: None   Collection Time    04/02/13  4:56 AM      Result Value Ref Range   Creatinine, Ser 0.85  0.50 - 1.35 mg/dL   GFR calc non Af Amer >90  >90 mL/min   GFR calc Af Amer >90  >90 mL/min   Comment: (NOTE)     The eGFR has been calculated using the CKD EPI equation.     This calculation has not been validated in all clinical situations.     eGFR's persistently <90 mL/min signify possible Chronic Kidney     Disease.  CULTURE, ROUTINE-ABSCESS     Status: None   Collection Time    04/02/13  4:34 PM      Result Value Ref Range   Specimen Description PERITONEAL CAVITY     Special Requests Normal  Gram Stain       Value: NO WBC SEEN     NO SQUAMOUS EPITHELIAL CELLS SEEN     NO ORGANISMS SEEN     Performed at [facility]   Culture       Value: NO GROWTH 3 DAYS     Performed at [facility]   Report Status 04/06/2013 FINAL    ANAEROBIC CULTURE     Status: None   Collection Time    04/02/13  4:35 PM      Result Value Ref Range   Specimen Description PERITONEAL CAVITY     Special Requests Normal     Gram Stain       Value: NO WBC SEEN     NO SQUAMOUS EPITHELIAL CELLS SEEN     NO ORGANISMS SEEN     Performed at [facility]   Culture       Value: NO ANAEROBES ISOLATED     Performed at [facility]   Report Status 04/07/2013 FINAL    CBC     Status: Abnormal   Collection Time    04/03/13  4:40 AM      Result Value Ref Range   WBC 7.7  4.0 - 10.5 K/uL   RBC 4.06 (*) 4.22 - 5.81 MIL/uL   Hemoglobin 12.0 (*) 13.0 - 17.0 g/dL   HCT 35.6 (*) 39.0 - 52.0 %   MCV 87.7  78.0 - 100.0 fL   MCH 29.6  26.0 - 34.0 pg   MCHC 33.7  30.0 - 36.0 g/dL   RDW 13.1  11.5 - 15.5 %   Platelets 250  150 - 400 K/uL  POTASSIUM     Status: None    Collection Time    04/03/13  4:40 AM      Result Value Ref Range   Potassium 4.3  3.7 - 5.3 mEq/L  CREATININE, SERUM     Status: None   Collection Time    04/03/13  4:40 AM      Result Value Ref Range   Creatinine, Ser 0.76  0.50 - 1.35 mg/dL   GFR calc non Af Amer >90  >90 mL/min   GFR calc Af Amer >90  >90 mL/min   Comment: (NOTE)     The eGFR has been calculated using the CKD EPI equation.     This calculation has not been validated in all clinical situations.     eGFR's persistently <90 mL/min signify possible Chronic Kidney     Disease.   Musculoskeletal: Strength & Muscle Tone: within normal limits Gait & Station: normal Patient leans: N/A  Psychiatric: Speech (describe rate, volume, coherence, spontaneity, and abnormalities if any): clear and coherent with normal tone volume.  Thought Process (describe rate, content, abstract reasoning, and computation): Logical and goal-directed.  No flight of ideas or any loose association.  Associations: Coherent, Relevant and Intact  Thoughts: normal  Mental Status: Orientation: oriented to person, place, time/date and situation Mood & Affect: Tired and affect is mood appropriate. Attention Span & Concentration: Good  Medical Decision Making (Choose Three): Established Problem, Stable/Improving (1), Review of Last Therapy Session (1) and Review of New Medication or Change in Dosage (2)  Assessment: Axis I: Mood disorder NOS, attention deficit disorder.  Axis II: Deferred  Axis III:  Patient Active Problem List   Diagnosis Date Noted  . Diverticulitis of colon with perforation 03/29/2013  . Pancolonic diverticulosis 09/09/2012  . Diverticulitis of jejunum  with microperforation s/p lap resection [id number] 08/28/2012  . Constipation 08/27/2012  . ADD 11/20/2007  . COLONIC POLYPS, HX OF 11/20/2007  . HYPERLIPIDEMIA 06/12/2007  . HYPERTENSION 06/12/2007  . HEMORRHOIDS, INTERNAL 11/27/2006  . G E R D 09/19/2006    Axis  IV: Mild to moderate  Axis V: 65-70   Plan:  Patient is doing better on his Ritalin 20 mg in the morning and 1 mg at noon, Lamictal 150 mg twice a day and Wellbutrin XL 150 mg daily.  He has no tremors, shakes , rash or any itching.  Patient wants his Wellbutrin prescribed by this office.  He used to get Wellbutrin by his primary care physician.  Recommended to call is back if he has any question or any concern.  Followup in 3 months.  Portion of this note is generated with voice dictation software and may contain typographical error.  Eberardo Demello T., MD 06/21/2013

## 2013-07-23 ENCOUNTER — Other Ambulatory Visit: Payer: Self-pay | Admitting: Internal Medicine

## 2013-08-04 ENCOUNTER — Encounter: Payer: Self-pay | Admitting: Internal Medicine

## 2013-08-04 MED ORDER — ROSUVASTATIN CALCIUM 20 MG PO TABS: 20.0000 mg | ORAL_TABLET | ORAL | Status: DC

## 2013-08-04 MED ORDER — FLUTICASONE PROPIONATE 50 MCG/ACT NA SUSP: 2.0000 | Freq: Every day | NASAL | Status: DC

## 2013-08-18 ENCOUNTER — Encounter: Payer: Self-pay | Admitting: Internal Medicine

## 2013-08-18 ENCOUNTER — Ambulatory Visit (INDEPENDENT_AMBULATORY_CARE_PROVIDER_SITE_OTHER): Payer: 59 | Admitting: Internal Medicine

## 2013-08-18 ENCOUNTER — Other Ambulatory Visit (INDEPENDENT_AMBULATORY_CARE_PROVIDER_SITE_OTHER): Payer: 59

## 2013-08-18 VITALS — BP 124/90 | HR 80 | Temp 98.4°F | Resp 14 | Ht 73.0 in | Wt 212.1 lb

## 2013-08-18 DIAGNOSIS — E785 Hyperlipidemia, unspecified: Secondary | ICD-10-CM

## 2013-08-18 DIAGNOSIS — Z0000 Encounter for general adult medical examination without abnormal findings: Secondary | ICD-10-CM

## 2013-08-18 LAB — BASIC METABOLIC PANEL
BUN: 21 mg/dL (ref 6–23)
CO2: 28 mEq/L (ref 19–32)
Calcium: 10.1 mg/dL (ref 8.4–10.5)
Chloride: 104 mEq/L (ref 96–112)
Creatinine, Ser: 1.1 mg/dL (ref 0.4–1.5)
GFR: 77.49 mL/min (ref >60.00)
Glucose, Bld: 99 mg/dL (ref 70–99)
POTASSIUM: 4.9 meq/L (ref 3.5–5.1)
SODIUM: 139 meq/L (ref 135–145)

## 2013-08-18 LAB — CBC WITH DIFFERENTIAL/PLATELET
Basophils Absolute: 0.1 10*3/uL (ref 0.0–0.1)
Basophils Relative: 0.8 % (ref 0.0–3.0)
EOS PCT: 3.5 % (ref 0.0–5.0)
Eosinophils Absolute: 0.3 10*3/uL (ref 0.0–0.7)
HEMATOCRIT: 45.9 % (ref 39.0–52.0)
Hemoglobin: 15.4 g/dL (ref 13.0–17.0)
LYMPHS ABS: 3.8 10*3/uL (ref 0.7–4.0)
Lymphocytes Relative: 40.6 % (ref 12.0–46.0)
MCHC: 33.7 g/dL (ref 30.0–36.0)
MCV: 88.1 fl (ref 78.0–100.0)
MONOS PCT: 7.3 % (ref 3.0–12.0)
Monocytes Absolute: 0.7 10*3/uL (ref 0.1–1.0)
Neutro Abs: 4.5 10*3/uL (ref 1.4–7.7)
Neutrophils Relative %: 47.8 % (ref 43.0–77.0)
PLATELETS: 228 10*3/uL (ref 150.0–400.0)
RBC: 5.2 Mil/uL (ref 4.22–5.81)
RDW: 13.2 % (ref 11.5–15.5)
WBC: 9.4 10*3/uL (ref 4.0–10.5)

## 2013-08-18 LAB — HEPATIC FUNCTION PANEL
ALBUMIN: 4.6 g/dL (ref 3.5–5.2)
ALK PHOS: 81 U/L (ref 39–117)
ALT: 28 U/L (ref 0–53)
AST: 17 U/L (ref 0–37)
BILIRUBIN TOTAL: 0.5 mg/dL (ref 0.2–1.2)
Bilirubin, Direct: 0.1 mg/dL (ref 0.0–0.3)
Total Protein: 7.4 g/dL (ref 6.0–8.3)

## 2013-08-18 LAB — LIPID PANEL
Cholesterol: 166 mg/dL (ref 0–200)
HDL: 36.5 mg/dL — ABNORMAL LOW (ref >39.00)
LDL Cholesterol: 101 mg/dL — ABNORMAL HIGH (ref 0–99)
NONHDL: 129.5
Total CHOL/HDL Ratio: 5
Triglycerides: 145 mg/dL (ref 0.0–149.0)
VLDL: 29 mg/dL (ref 0.0–40.0)

## 2013-08-18 LAB — PSA: PSA: 0.58 ng/mL (ref 0.10–4.00)

## 2013-08-18 LAB — HEMOGLOBIN A1C: Hgb A1c MFr Bld: 5.6 % (ref 4.6–6.5)

## 2013-08-18 LAB — TSH: TSH: 2.03 u[IU]/mL (ref 0.35–4.50)

## 2013-08-18 NOTE — Patient Instructions (Signed)

## 2013-08-18 NOTE — Progress Notes (Signed)
   Subjective:    Patient ID: Dakota Vang, male    DOB: 07/02/1965, 48 y.o.   MRN: [id number]  HPI  He is here for a physical;acute issues denied.  A modified heart healthy diet is followed; exercise encompasses walking @ work up to 2 mpd.  Family history is negative for premature coronary disease. Advanced cholesterol testing reveals  LDL goal is less than 100 ; ideally < 70 Compliance with the statin.  Low dose ASA taken    Review of Systems Specifically denied are  chest pain, palpitations, dyspnea, or claudication.  Significant abdominal symptoms, memory deficit, or myalgias not present.        Objective:   Physical Exam Gen.: Healthy and well-nourished in appearance. Alert, appropriate and cooperative throughout exam. Appears younger than stated age  Head: Normocephalic without obvious abnormalities; no alopecia . Beard & moustache. Eyes: No corneal or conjunctival inflammation noted. Pupils equal round reactive to light and accommodation. Extraocular motion intact. Ears: External  ear exam reveals no significant lesions or deformities. Canals clear .TMs normal. Hearing is grossly normal bilaterally. Nose: External nasal exam reveals no deformity or inflammation. Nasal mucosa are pink and moist. No lesions or exudates noted.   Mouth: Oral mucosa and oropharynx reveal no lesions or exudates. Teeth in good repair. Neck: No deformities, masses, or tenderness noted. Range of motion & Thyroid normal. Lungs: Normal respiratory effort; chest expands symmetrically. Lungs are clear to auscultation without rales, wheezes, or increased work of breathing. Heart: Normal rate and rhythm. Normal S1 and S2. No gallop, click, or rub. No murmur. Abdomen: Bowel sounds normal; abdomen soft and nontender. No masses, organomegaly or hernias noted. Genitalia: Genitalia normal except for left varices. Prostate is not enlarged but is minimally asymmetric. The right lobe slightly larger than the left.  No nodules or induration present. Musculoskeletal/extremities: No deformity or scoliosis noted of  the thoracic or lumbar spine.  No clubbing, cyanosis, edema, or significant extremity  deformity noted. Range of motion normal .Tone & strength normal. Hand joints normal.  Fingernail  health good. Able to lie down & sit up w/o help. Negative SLR bilaterally Vascular: Carotid, radial artery, dorsalis pedis and  posterior tibial pulses are full and equal. No bruits present. Neurologic: Alert and oriented x3. Deep tendon reflexes symmetrical and normal.  Gait normal.       Skin: Intact without suspicious lesions or rashes. Lymph: No cervical, axillary, or inguinal lymphadenopathy present. Psych: Mood and affect are normal. Normally interactive                                                                                        Assessment & Plan:  #1 comprehensive physical exam; no acute findings  Plan: see Orders  & Recommendations

## 2013-08-18 NOTE — Progress Notes (Signed)
Pre visit review using our clinic review tool, if applicable. No additional management support is needed unless otherwise documented below in the visit note. 

## 2013-09-20 ENCOUNTER — Ambulatory Visit (INDEPENDENT_AMBULATORY_CARE_PROVIDER_SITE_OTHER): Payer: 59 | Admitting: Psychiatry

## 2013-09-20 ENCOUNTER — Encounter (HOSPITAL_COMMUNITY): Payer: Self-pay | Admitting: Psychiatry

## 2013-09-20 VITALS — BP 144/94 | HR 84 | Ht 73.0 in | Wt 217.8 lb

## 2013-09-20 DIAGNOSIS — F988 Other specified behavioral and emotional disorders with onset usually occurring in childhood and adolescence: Secondary | ICD-10-CM

## 2013-09-20 DIAGNOSIS — F39 Unspecified mood [affective] disorder: Secondary | ICD-10-CM

## 2013-09-20 MED ORDER — BUPROPION HCL ER (XL) 300 MG PO TB24: ORAL_TABLET | ORAL | Status: DC

## 2013-09-20 MED ORDER — METHYLPHENIDATE HCL 20 MG PO TABS: ORAL_TABLET | ORAL | Status: DC

## 2013-09-20 MED ORDER — LAMOTRIGINE 150 MG PO TABS: 150.0000 mg | ORAL_TABLET | Freq: Two times a day (BID) | ORAL | Status: DC

## 2013-09-20 NOTE — Progress Notes (Signed)
Dakota Vang Progress Note  Dakota Vang [id number] 48 y.o.  09/20/2013 3:06 PM  Chief Complaint:  Medication management and followup.      History of Present Illness:  Dakota Vang came for his followup appointment.  He is complaining of increased stress at work but overall he is sleeping better and denies any irritability, agitation and mood swings.  He's taking his medication and denies any side effects.  Recently he visited his primary care physician and he was happy his blood work was good.  She denies any paranoia or any hallucination.  He denies any feels hopelessness and worthlessness.  He is happy because he is busy at worksometime he gets frustrated when he feels overwhelmed.  Recently he had a good family vacation at [place].  Patient denies any drinking or using any illicit substances.  He has no tremors or any shakes.  He does not ask for early refills of his medication.  His vitals are stable.  His appetite is okay.  He feels his current medicine is working very well.  He is able to do multitasking.  Suicidal Ideation: No Plan Formed: No Patient has means to carry out plan: No  Homicidal Ideation: No Plan Formed: No Patient has means to carry out plan: No  Review of Systems: Psychiatric: Agitation: No Hallucination: No Depressed Mood: No Insomnia: No Hypersomnia: Yes Altered Concentration: No Feels Worthless: No Grandiose Ideas: No Belief In Special Powers: No New/Increased Substance Abuse: No Compulsions: No  Neurologic: Headache: No Seizure: No Paresthesias: No  Medical History. Patient has multiple medical problems including diverticulitis, hyperlipidemia, hypertension, back pain and GERD.  His primary care physician is Unice Cobble .    Family and Social History: Patient endorse mother has depression and history of psychiatric inpatient treatment.  Patient is born and raised in [place].  He has a good childhood.  He lives with his wife and  his 3 children.  He has good social network.  He is employed .  He denies any history of abuse in the past.  Alcohol and substance use history. Patient admitted history of DWI and drinking alcohol in the past however claims to be sober since he is taking Ritalin.  Outpatient Encounter Prescriptions as of 09/20/2013  Medication Sig  . amLODipine (NORVASC) 5 MG tablet (PT NEEDS COMPLETE  PHYSICAL) TAKE 1 TABLET BY  MOUTH DAILY.  Marland Kitchen aspirin EC 81 MG tablet Take 81 mg by mouth daily.  Marland Kitchen buPROPion (WELLBUTRIN XL) 300 MG 24 hr tablet Take 1 tablet by mouth  daily with breakfast  . carvedilol (COREG) 3.125 MG tablet Take 1 tablet (3.125 mg total) by mouth 2 (two) times daily with a meal.  . fluticasone (FLONASE) 50 MCG/ACT nasal spray Place 2 sprays into both nostrils daily.  Marland Kitchen lamoTRIgine (LAMICTAL) 150 MG tablet Take 1 tablet (150 mg total) by mouth 2 (two) times daily.  . methylphenidate (RITALIN) 20 MG tablet Take one in AM and one at [facility]  . omeprazole (PRILOSEC) 20 MG capsule Take 1 capsule by mouth   daily  . ramipril (ALTACE) 10 MG capsule Take 1 capsule (10 mg   total) by mouth daily.  . rosuvastatin (CRESTOR) 20 MG tablet Take 1 tablet (20 mg total) by mouth 4 (four) times a week. Take on Sunday Monday Wednesday and Friday.  . [DISCONTINUED] buPROPion (WELLBUTRIN XL) 300 MG 24 hr tablet Take 1 tablet by mouth  daily with breakfast  . [DISCONTINUED] lamoTRIgine (LAMICTAL) 150 MG tablet  Take 1 tablet (150 mg total) by mouth 2 (two) times daily.  . [DISCONTINUED] methylphenidate (RITALIN) 20 MG tablet Take one in AM and one at [facility]  . [DISCONTINUED] methylphenidate (RITALIN) 20 MG tablet Take one in AM and one at [facility]  . [DISCONTINUED] methylphenidate (RITALIN) 20 MG tablet Take one in AM and one at [facility]    Past Psychiatric History/Hospitalization(s): Anxiety: Yes Bipolar Disorder: No Depression: Patient has history of depression and mood swing for a long time.  He start taking psychotropic  medication by his primary care physician Dr. Unice Cobble.  In the past he was given Prozac but limited response.   Mania: No Psychosis: No Schizophrenia: No Personality Disorder: No Hospitalization for psychiatric illness: No History of Electroconvulsive Shock Therapy: No Prior Suicide Attempts: No  Physical Exam: Constitutional:  BP 144/94  Pulse 84  Ht 6\' 1"  (1.854 m)  Wt 217 lb 12.8 oz (98.793 kg)  BMI 28.74 kg/m2  General Appearance: alert, oriented, no acute distress and well nourished  Recent Results (from the past 2160 hour(s))  BASIC METABOLIC PANEL     Status: None   Collection Time    08/18/13 10:36 AM      Result Value Ref Range   Sodium 139  135 - 145 mEq/L   Potassium 4.9  3.5 - 5.1 mEq/L   Chloride 104  96 - 112 mEq/L   CO2 28  19 - 32 mEq/L   Glucose, Bld 99  70 - 99 mg/dL   BUN 21  6 - 23 mg/dL   Creatinine, Ser 1.1  0.4 - 1.5 mg/dL   Calcium 10.1  8.4 - 10.5 mg/dL   GFR 77.49  >60.00 mL/min  CBC WITH DIFFERENTIAL     Status: None   Collection Time    08/18/13 10:36 AM      Result Value Ref Range   WBC 9.4  4.0 - 10.5 K/uL   RBC 5.20  4.22 - 5.81 Mil/uL   Hemoglobin 15.4  13.0 - 17.0 g/dL   HCT 45.9  39.0 - 52.0 %   MCV 88.1  78.0 - 100.0 fl   MCHC 33.7  30.0 - 36.0 g/dL   RDW 13.2  11.5 - 15.5 %   Platelets 228.0  150.0 - 400.0 K/uL   Neutrophils Relative % 47.8  43.0 - 77.0 %   Lymphocytes Relative 40.6  12.0 - 46.0 %   Monocytes Relative 7.3  3.0 - 12.0 %   Eosinophils Relative 3.5  0.0 - 5.0 %   Basophils Relative 0.8  0.0 - 3.0 %   Neutro Abs 4.5  1.4 - 7.7 K/uL   Lymphs Abs 3.8  0.7 - 4.0 K/uL   Monocytes Absolute 0.7  0.1 - 1.0 K/uL   Eosinophils Absolute 0.3  0.0 - 0.7 K/uL   Basophils Absolute 0.1  0.0 - 0.1 K/uL  HEMOGLOBIN A1C     Status: None   Collection Time    08/18/13 10:36 AM      Result Value Ref Range   Hemoglobin A1C 5.6  4.6 - 6.5 %   Comment: Glycemic Control Guidelines for People with Diabetes:Non Diabetic:  <6%Goal  of Therapy: <7%Additional Action Suggested:  >8%   HEPATIC FUNCTION PANEL     Status: None   Collection Time    08/18/13 10:36 AM      Result Value Ref Range   Total Bilirubin 0.5  0.2 - 1.2 mg/dL   Bilirubin, Direct 0.1  0.0 - 0.3 mg/dL   Alkaline Phosphatase 81  39 - 117 U/L   AST 17  0 - 37 U/L   ALT 28  0 - 53 U/L   Total Protein 7.4  6.0 - 8.3 g/dL   Albumin 4.6  3.5 - 5.2 g/dL  LIPID PANEL     Status: Abnormal   Collection Time    08/18/13 10:36 AM      Result Value Ref Range   Cholesterol 166  0 - 200 mg/dL   Comment: ATP III Classification       Desirable:  < 200 mg/dL               Borderline High:  200 - 239 mg/dL          High:  > = 240 mg/dL   Triglycerides 145.0  0.0 - 149.0 mg/dL   Comment: Normal:  <150 mg/dLBorderline High:  150 - 199 mg/dL   HDL 36.50 (*) >39.00 mg/dL   VLDL 29.0  0.0 - 40.0 mg/dL   LDL Cholesterol 101 (*) 0 - 99 mg/dL   Total CHOL/HDL Ratio 5     Comment:                Men          Women1/2 Average Risk     3.4          3.3Average Risk          5.0          4.42X Average Risk          9.6          7.13X Average Risk          15.0          11.0                       NonHDL 129.50    TSH     Status: None   Collection Time    08/18/13 10:36 AM      Result Value Ref Range   TSH 2.03  0.35 - 4.50 uIU/mL  PSA     Status: None   Collection Time    08/18/13 10:36 AM      Result Value Ref Range   PSA 0.58  0.10 - 4.00 ng/mL   Musculoskeletal: Strength & Muscle Tone: within normal limits Gait & Station: normal Patient leans: N/A  Psychiatric: Speech (describe rate, volume, coherence, spontaneity, and abnormalities if any): clear and coherent with normal tone volume.  Thought Process (describe rate, content, abstract reasoning, and computation): Logical and goal-directed.  No flight of ideas or any loose association.  Associations: Coherent, Relevant and Intact  Thoughts: normal  Mental Status: Orientation: oriented to person, place,  time/date and situation Mood & Affect: Tired and affect is mood appropriate. Attention Span & Concentration: Good  Established Problem, Stable/Improving (1), Review and summation of old records (2), Review of Last Therapy Session (1) and Review of New Medication or Change in Dosage (2)  Assessment: Axis I: Mood disorder NOS, attention deficit disorder.  Axis II: Deferred  Axis III:  Patient Active Problem List   Diagnosis Date Noted  . Diverticulitis of colon with perforation 03/29/2013  . Pancolonic diverticulosis 09/09/2012  . Diverticulitis of jejunum with microperforation s/p lap resection [id number] 08/28/2012  . Constipation 08/27/2012  . ADD 11/20/2007  . COLONIC POLYPS, HX OF 11/20/2007  . HYPERLIPIDEMIA 06/12/2007  .  HYPERTENSION 06/12/2007  . HEMORRHOIDS, INTERNAL 11/27/2006  . G E R D 09/19/2006    Axis IV: Mild to moderate  Axis V: 65-70   Plan:  Patient is a stable on his current psychotropic medication.  He wants to put him to be prescribed from this office which was originally given by his private physician.  I will continue Ritalin 20 mg in the morning and 1 mg at noon, Lamictal 150 mg twice a day and Wellbutrin XL 300 mg daily.  He has no tremors, shakes , rash or any itching.  Recommended to call is back if he has any question or any concern.  Followup in 3 months.  Portion of this note is generated with voice dictation software and may contain typographical error.  Emmalou Hunger T., MD 09/20/2013

## 2013-11-24 ENCOUNTER — Other Ambulatory Visit: Payer: Self-pay | Admitting: Internal Medicine

## 2013-11-24 ENCOUNTER — Encounter: Payer: Self-pay | Admitting: Internal Medicine

## 2013-11-24 DIAGNOSIS — E785 Hyperlipidemia, unspecified: Secondary | ICD-10-CM

## 2013-11-24 MED ORDER — ROSUVASTATIN CALCIUM 10 MG PO TABS: 10.0000 mg | ORAL_TABLET | Freq: Every day | ORAL | Status: DC

## 2013-12-09 ENCOUNTER — Other Ambulatory Visit: Payer: Self-pay | Admitting: Internal Medicine

## 2013-12-22 ENCOUNTER — Encounter (HOSPITAL_COMMUNITY): Payer: Self-pay | Admitting: Psychiatry

## 2013-12-22 ENCOUNTER — Ambulatory Visit (INDEPENDENT_AMBULATORY_CARE_PROVIDER_SITE_OTHER): Payer: 59 | Admitting: Psychiatry

## 2013-12-22 VITALS — BP 132/86 | HR 89 | Ht 73.0 in | Wt 218.6 lb

## 2013-12-22 DIAGNOSIS — F909 Attention-deficit hyperactivity disorder, unspecified type: Secondary | ICD-10-CM

## 2013-12-22 DIAGNOSIS — F39 Unspecified mood [affective] disorder: Secondary | ICD-10-CM

## 2013-12-22 DIAGNOSIS — F330 Major depressive disorder, recurrent, mild: Secondary | ICD-10-CM

## 2013-12-22 DIAGNOSIS — F988 Other specified behavioral and emotional disorders with onset usually occurring in childhood and adolescence: Secondary | ICD-10-CM

## 2013-12-22 MED ORDER — METHYLPHENIDATE HCL 20 MG PO TABS: ORAL_TABLET | ORAL | Status: DC

## 2013-12-22 MED ORDER — LAMOTRIGINE 150 MG PO TABS: 150.0000 mg | ORAL_TABLET | Freq: Two times a day (BID) | ORAL | Status: DC

## 2013-12-22 MED ORDER — BUPROPION HCL ER (XL) 300 MG PO TB24: ORAL_TABLET | ORAL | Status: DC

## 2013-12-22 NOTE — Progress Notes (Signed)
[place] Progress Note  Dakota Vang [id number] 48 y.o.  12/22/2013 3:31 PM  Chief Complaint:  Medication management and followup.      History of Present Illness:  Dakota Vang came for his followup appointment.  He is compliant with his medication.  He wants to 3rd Ritalin in the afternoon because he gets very tired and sleepy.  In the morning he is able to do multitasking however in the afternoon his attention and concentration gets limited.  He has difficulty completing this task.  Overall his mood has been stable.  He denies any agitation, irritability or any anger.  He keeping himself busy by taking care of his family.  He was upset this weekend because his son broke his arm while skating.  He denies any side effects of medication including any tremors, shakes or any rash.  Patient denies any paranoia or any hallucination.  His vitals are stable.  His appetite is okay.  He is taking his blood pressure medication.    Suicidal Ideation: No Plan Formed: No Patient has means to carry out plan: No  Homicidal Ideation: No Plan Formed: No Patient has means to carry out plan: No  Review of Systems  Constitutional: Positive for malaise/fatigue.  Skin: Negative for itching and rash.  Neurological: Negative.   Psychiatric/Behavioral:       Hypersomnia   Psychiatric: Agitation: No Hallucination: No Depressed Mood: No Insomnia: No Hypersomnia: Yes Altered Concentration: No Feels Worthless: No Grandiose Ideas: No Belief In Special Powers: No New/Increased Substance Abuse: No Compulsions: No  Neurologic: Headache: No Seizure: No Paresthesias: No  Medical History. Patient has multiple medical problems including diverticulitis, hyperlipidemia, hypertension, back pain and GERD.  His primary care physician is Dakota Vang .    Family and Social History: Patient endorse mother has depression and history of psychiatric inpatient treatment.  Patient is born and  raised in [place].  He has a good childhood.  He lives with his wife and his 3 children.  He has good social network.  He is employed .  He denies any history of abuse in the past.  Alcohol and substance use history. Patient admitted history of DWI and drinking alcohol in the past however claims to be sober since he is taking Ritalin.  Outpatient Encounter Prescriptions as of 12/22/2013  Medication Sig  . amLODipine (NORVASC) 5 MG tablet (PT NEEDS COMPLETE  PHYSICAL) TAKE 1 TABLET BY  MOUTH DAILY.  Marland Kitchen aspirin EC 81 MG tablet Take 81 mg by mouth daily.  Marland Kitchen buPROPion (WELLBUTRIN XL) 300 MG 24 hr tablet Take 1 tablet by mouth  daily with breakfast  . carvedilol (COREG) 3.125 MG tablet Take 1 tablet (3.125 mg  total) by mouth 2 times  daily with a meal.  . fluticasone (FLONASE) 50 MCG/ACT nasal spray Place 2 sprays into both nostrils daily.  Marland Kitchen lamoTRIgine (LAMICTAL) 150 MG tablet Take 1 tablet (150 mg total) by mouth 2 (two) times daily.  . methylphenidate (RITALIN) 20 MG tablet Take one in AM, noon and afternoon  . omeprazole (PRILOSEC) 20 MG capsule Take 1 capsule by mouth   daily  . ramipril (ALTACE) 10 MG capsule Take 1 capsule (10 mg   total) by mouth daily.  . rosuvastatin (CRESTOR) 10 MG tablet Take 1 tablet (10 mg total) by mouth daily.  . [DISCONTINUED] buPROPion (WELLBUTRIN XL) 300 MG 24 hr tablet Take 1 tablet by mouth  daily with breakfast  . [DISCONTINUED] lamoTRIgine (LAMICTAL) 150 MG  tablet Take 1 tablet (150 mg total) by mouth 2 (two) times daily.  . [DISCONTINUED] methylphenidate (RITALIN) 20 MG tablet Take one in AM and one at [facility]  . [DISCONTINUED] methylphenidate (RITALIN) 20 MG tablet Take one in AM, noon and afternoon  . [DISCONTINUED] methylphenidate (RITALIN) 20 MG tablet Take one in AM, noon and afternoon    Past Psychiatric History/Hospitalization(s): Anxiety: Yes Bipolar Disorder: No Depression: Patient has history of depression and mood swing for a long time.  He start  taking psychotropic medication by his primary care physician Dr. Unice Vang.  In the past he was given Prozac but limited response.   Mania: No Psychosis: No Schizophrenia: No Personality Disorder: No Hospitalization for psychiatric illness: No History of Electroconvulsive Shock Therapy: No Prior Suicide Attempts: No  Physical Exam: Constitutional:  BP 132/86  Pulse 89  Ht 6\' 1"  (1.854 m)  Wt 218 lb 9.6 oz (99.156 kg)  BMI 28.85 kg/m2  General Appearance: alert, oriented, no acute distress and well nourished  No results found for this or any previous visit (from the past 2160 hour(s)). Musculoskeletal: Strength & Muscle Tone: within normal limits Gait & Station: normal Patient leans: N/A  Psychiatric: Speech (describe rate, volume, coherence, spontaneity, and abnormalities if any): clear and coherent with normal tone volume.  Thought Process (describe rate, content, abstract reasoning, and computation): Logical and goal-directed.  No flight of ideas or any loose association.  Associations: Coherent, Relevant and Intact  Thoughts: normal  Mental Status: Orientation: oriented to person, place, time/date and situation Mood & Affect: Tired and affect is mood appropriate. Attention Span & Concentration: Good  Established Problem, Stable/Improving (1), Review of Psycho-Social Stressors (1), Established Problem, Worsening (2), Review of Last Therapy Session (1), Review of Medication Regimen & Side Effects (2) and Review of New Medication or Change in Dosage (2)  Assessment: Axis I: Mood disorder NOS, attention deficit disorder.  Axis II: Deferred  Axis III:  Patient Active Problem List   Diagnosis Date Noted  . Diverticulitis of colon with perforation 03/29/2013  . Pancolonic diverticulosis 09/09/2012  . Diverticulitis of jejunum with microperforation s/p lap resection [id number] 08/28/2012  . Constipation 08/27/2012  . ADD 11/20/2007  . COLONIC POLYPS, HX OF  11/20/2007  . HYPERLIPIDEMIA 06/12/2007  . HYPERTENSION 06/12/2007  . HEMORRHOIDS, INTERNAL 11/27/2006  . G E R D 09/19/2006    Axis IV: Mild to moderate  Axis V: 65-70   Plan:  I will try Ritalin 20 mg 1 in the morning , noon and one in the afternoon.  Discussed medication side effects especially hypertension and insomnia.  Patient is taking his medication as prescribed.  He does not ask or the details of the stimulant.  He is not drinking or using any illegal substances.  I will continue Lamictal 150 mg twice a day and Wellbutrin XL 300 mg daily.  Recommended to call us back if he has any question or any concern.  I will see him again in 3 months. Time spent 25 minutes.  More than 50% of the time spent in psychoeducation, counseling and coordination of care.  Discuss safety plan that anytime having active suicidal thoughts or homicidal thoughts then patient need to call 911 or go to the local emergency room.   Lashaunda Schild T., MD 12/22/2013

## 2014-01-26 ENCOUNTER — Encounter: Payer: Self-pay | Admitting: Internal Medicine

## 2014-01-26 MED ORDER — CARVEDILOL 3.125 MG PO TABS: ORAL_TABLET | ORAL | Status: DC

## 2014-01-26 NOTE — Addendum Note (Signed)
Addended by: Roma Schanz R on: 01/26/2014 03:06 PM   Modules accepted: Orders

## 2014-03-06 ENCOUNTER — Other Ambulatory Visit: Payer: Self-pay | Admitting: Internal Medicine

## 2014-03-23 ENCOUNTER — Other Ambulatory Visit (HOSPITAL_COMMUNITY): Payer: Self-pay | Admitting: Psychiatry

## 2014-03-23 ENCOUNTER — Encounter: Payer: Self-pay | Admitting: Internal Medicine

## 2014-03-23 MED ORDER — CLINDAMYCIN PHOSPHATE 1 % EX SOLN: Freq: Two times a day (BID) | CUTANEOUS | Status: DC

## 2014-03-30 ENCOUNTER — Ambulatory Visit (INDEPENDENT_AMBULATORY_CARE_PROVIDER_SITE_OTHER): Payer: 59 | Admitting: Psychiatry

## 2014-03-30 ENCOUNTER — Encounter (HOSPITAL_COMMUNITY): Payer: Self-pay | Admitting: Psychiatry

## 2014-03-30 VITALS — BP 136/92 | HR 86 | Ht 73.0 in | Wt 234.4 lb

## 2014-03-30 DIAGNOSIS — F330 Major depressive disorder, recurrent, mild: Secondary | ICD-10-CM

## 2014-03-30 DIAGNOSIS — F988 Other specified behavioral and emotional disorders with onset usually occurring in childhood and adolescence: Secondary | ICD-10-CM

## 2014-03-30 DIAGNOSIS — F909 Attention-deficit hyperactivity disorder, unspecified type: Secondary | ICD-10-CM

## 2014-03-30 DIAGNOSIS — F39 Unspecified mood [affective] disorder: Secondary | ICD-10-CM

## 2014-03-30 MED ORDER — METHYLPHENIDATE HCL 20 MG PO TABS: ORAL_TABLET | ORAL | Status: DC

## 2014-03-30 MED ORDER — LAMOTRIGINE 150 MG PO TABS: 150.0000 mg | ORAL_TABLET | Freq: Two times a day (BID) | ORAL | Status: DC

## 2014-03-30 NOTE — Progress Notes (Signed)
Dakota Vang Progress Note  Dakota Vang [id number] 49 y.o.  03/30/2014 10:15 AM  Chief Complaint:  Medication management and followup.      History of Present Illness:  [name] came for his followup appointment.  On his last visit we increase his Ritalin and now he is taking 20 mg in the morning, unknown and in afternoon.  His attention and concentration is much improved.  He is able to do multitasking.  He denies any side effects including any irritability, anger or any mood swing.  Today his blood pressure is slightly increase but he mentioned that he has not taken his blood pressure medication.  Overall his been doing well.  He is planning to take a cruise trip with his children on spring break.  He had a good Christmas.  He likes his job but he has been very busy at work.  He is happy that his job performance is improved.  Patient denies drinking or using any illegal substances.  His appetite is improved and he is happy that he is back to his original weight.    Suicidal Ideation: No Plan Formed: No Patient has means to carry out plan: No  Homicidal Ideation: No Plan Formed: No Patient has means to carry out plan: No  ROS Psychiatric: Agitation: No Hallucination: No Depressed Mood: No Insomnia: No Hypersomnia: No Altered Concentration: No Feels Worthless: No Grandiose Ideas: No Belief In Special Powers: No New/Increased Substance Abuse: No Compulsions: No  Neurologic: Headache: No Seizure: No Paresthesias: No  Medical History. Patient has history of diverticulitis, hyperlipidemia, hypertension, back pain and GERD.  His primary care physician is Unice Cobble .    Family and Social History: Patient endorse mother has depression and history of psychiatric inpatient treatment.  Patient is born and raised in [place].  He has a good childhood.  He lives with his wife and his 3 children.  He has good social network.  He is employed .  He denies any history of  abuse in the past.  Alcohol and substance use history. Patient admitted history of DWI and drinking alcohol in the past however claims to be sober since he is taking Ritalin.  Outpatient Encounter Prescriptions as of 03/30/2014  Medication Sig  . amLODipine (NORVASC) 5 MG tablet (PT NEEDS COMPLETE  PHYSICAL) TAKE 1 TABLET BY  MOUTH DAILY.  Marland Kitchen aspirin EC 81 MG tablet Take 81 mg by mouth daily.  Marland Kitchen buPROPion (WELLBUTRIN XL) 300 MG 24 hr tablet Take 1 tablet by mouth  daily with breakfast  . carvedilol (COREG) 3.125 MG tablet Take 1 tablet (3.125 mg  total) by mouth 2 times  daily with a meal.  . clindamycin (CLEOCIN T) 1 % external solution Apply topically 2 (two) times daily.  . fluticasone (FLONASE) 50 MCG/ACT nasal spray Place 2 sprays into both nostrils daily.  Marland Kitchen lamoTRIgine (LAMICTAL) 150 MG tablet Take 1 tablet (150 mg total) by mouth 2 (two) times daily.  . methylphenidate (RITALIN) 20 MG tablet Take one in AM, noon and afternoon  . omeprazole (PRILOSEC) 20 MG capsule Take 1 capsule by mouth  daily  . ramipril (ALTACE) 10 MG capsule Take 1 capsule by mouth  daily  . rosuvastatin (CRESTOR) 10 MG tablet Take 1 tablet (10 mg total) by mouth daily.  . [DISCONTINUED] lamoTRIgine (LAMICTAL) 150 MG tablet Take 1 tablet (150 mg total) by mouth 2 (two) times daily.  . [DISCONTINUED] methylphenidate (RITALIN) 20 MG tablet Take one in AM,  noon and afternoon  . [DISCONTINUED] methylphenidate (RITALIN) 20 MG tablet Take one in AM, noon and afternoon  . [DISCONTINUED] methylphenidate (RITALIN) 20 MG tablet Take one in AM, noon and afternoon    Past Psychiatric History/Hospitalization(s): Anxiety: Yes Bipolar Disorder: No Depression: Patient has history of depression and mood swing for a long time.  He start taking psychotropic medication by his primary care physician Dr. Unice Cobble.  In the past he was given Prozac but limited response.   Mania: No Psychosis: No Schizophrenia: No Personality  Disorder: No Hospitalization for psychiatric illness: No History of Electroconvulsive Shock Therapy: No Prior Suicide Attempts: No  Physical Exam: Constitutional:  BP 136/92 mmHg  Pulse 86  Ht 6\' 1"  (1.854 m)  Wt 234 lb 6.4 oz (106.323 kg)  BMI 30.93 kg/m2  General Appearance: alert, oriented, no acute distress and well nourished  No results found for this or any previous visit (from the past 2160 hour(s)). Musculoskeletal: Strength & Muscle Tone: within normal limits Gait & Station: normal Patient leans: N/A  Psychiatric: Speech (describe rate, volume, coherence, spontaneity, and abnormalities if any): clear and coherent with normal tone volume.  Thought Process (describe rate, content, abstract reasoning, and computation): Logical and goal-directed.  No flight of ideas or any loose association.  Associations: Coherent, Relevant and Intact  Thoughts: normal  Mental Status: Orientation: oriented to person, place, time/date and situation Mood & Affect: normal affect Attention Span & Concentration: Good  Established Problem, Stable/Improving (1), Review of Last Therapy Session (1) and Review of Medication Regimen & Side Effects (2)  Assessment: Axis I: Mood disorder NOS, attention deficit disorder.  Axis II: Deferred  Axis III:  Patient Active Problem List   Diagnosis Date Noted  . Diverticulitis of colon with perforation 03/29/2013  . Pancolonic diverticulosis 09/09/2012  . Diverticulitis of jejunum with microperforation s/p lap resection [id number] 08/28/2012  . Constipation 08/27/2012  . ADD 11/20/2007  . COLONIC POLYPS, HX OF 11/20/2007  . HYPERLIPIDEMIA 06/12/2007  . HYPERTENSION 06/12/2007  . HEMORRHOIDS, INTERNAL 11/27/2006  . G E R D 09/19/2006    Axis IV: Mild to moderate  Axis V: 65-70   Plan:  Patient is doing better on his current medication.  He is able to do multitasking very well.  Reinforce medication compliance for his hypertension.  I will  continue Ritalin 20 mg in the morning , noon and in afternoon.  Continue Lamictal 150 mg twice a day and Wellbutrin XL 300 mg daily.  Patient does not have any rash itching or any other side effects. Recommended to call us back if he has any question or any concern.  Follow-up in 3 months.     ARFEEN,SYED T., MD 03/30/2014

## 2014-05-16 ENCOUNTER — Encounter: Payer: Self-pay | Admitting: Internal Medicine

## 2014-05-16 DIAGNOSIS — E785 Hyperlipidemia, unspecified: Secondary | ICD-10-CM

## 2014-05-16 MED ORDER — CARVEDILOL 3.125 MG PO TABS: ORAL_TABLET | ORAL | Status: DC

## 2014-05-16 MED ORDER — ROSUVASTATIN CALCIUM 10 MG PO TABS: 10.0000 mg | ORAL_TABLET | Freq: Every day | ORAL | Status: DC

## 2014-06-13 ENCOUNTER — Encounter: Payer: Self-pay | Admitting: Internal Medicine

## 2014-06-14 MED ORDER — FLUTICASONE PROPIONATE 50 MCG/ACT NA SUSP: 2.0000 | Freq: Every day | NASAL | Status: DC

## 2014-06-16 ENCOUNTER — Other Ambulatory Visit: Payer: Self-pay

## 2014-06-16 MED ORDER — AMLODIPINE BESYLATE 5 MG PO TABS: ORAL_TABLET | ORAL | Status: DC

## 2014-06-21 ENCOUNTER — Other Ambulatory Visit (HOSPITAL_COMMUNITY): Payer: Self-pay | Admitting: Psychiatry

## 2014-06-29 ENCOUNTER — Encounter (HOSPITAL_COMMUNITY): Payer: Self-pay | Admitting: Psychiatry

## 2014-06-29 ENCOUNTER — Ambulatory Visit (INDEPENDENT_AMBULATORY_CARE_PROVIDER_SITE_OTHER): Payer: 59 | Admitting: Psychiatry

## 2014-06-29 VITALS — BP 140/82 | HR 82 | Ht 73.0 in | Wt 236.8 lb

## 2014-06-29 DIAGNOSIS — F330 Major depressive disorder, recurrent, mild: Secondary | ICD-10-CM | POA: Diagnosis not present

## 2014-06-29 DIAGNOSIS — F909 Attention-deficit hyperactivity disorder, unspecified type: Secondary | ICD-10-CM

## 2014-06-29 DIAGNOSIS — F988 Other specified behavioral and emotional disorders with onset usually occurring in childhood and adolescence: Secondary | ICD-10-CM

## 2014-06-29 MED ORDER — METHYLPHENIDATE HCL 20 MG PO TABS: ORAL_TABLET | ORAL | Status: DC

## 2014-06-29 MED ORDER — LAMOTRIGINE 150 MG PO TABS: 150.0000 mg | ORAL_TABLET | Freq: Two times a day (BID) | ORAL | Status: DC

## 2014-06-29 NOTE — Progress Notes (Signed)
[facility] Progress Note  Dakota Vang [id number] 49 y.o.  06/29/2014 10:23 AM  Chief Complaint:  Medication management and followup.      History of Present Illness:  Dakota Vang came for his followup appointment.  He just came back from 5 days vacation .  He went to see [facility] with his family and he had a good time.  Since back he has been very busy at work.  He is taking his medication as prescribed.  He feels the current medicine is working very well.  He denies any irritability, anger, mood swing.  His attention and concentration is good.  He is able to do multitasking.  He denies any feeling of hopelessness or worthlessness.  He is compliant with Lamictal, Wellbutrin, Ritalin.  He has no rash or itching.  Sometime he gets tired when he come from work and sleeps early.  Patient denies drinking or using any illegal substances.  His appetite is okay.  His vitals are stable.  He is concerned that his primary care physician is retiring who he has been seen for 20 years.  Patient lives with his wife and 3 children .  Suicidal Ideation: No Plan Formed: No Patient has means to carry out plan: No  Homicidal Ideation: No Plan Formed: No Patient has means to carry out plan: No  Review of Systems  Constitutional: Negative.   Cardiovascular: Negative for palpitations.  Skin: Negative for itching and rash.  Neurological: Negative for tremors.  Psychiatric/Behavioral: Negative for suicidal ideas, hallucinations and substance abuse.   Psychiatric: Agitation: No Hallucination: No Depressed Mood: No Insomnia: No Hypersomnia: No Altered Concentration: No Feels Worthless: No Grandiose Ideas: No Belief In Special Powers: No New/Increased Substance Abuse: No Compulsions: No  Neurologic: Headache: No Seizure: No Paresthesias: No  Medical History. Patient has history of diverticulitis, hyperlipidemia, hypertension, back pain and GERD.  His primary care physician is  Dakota Vang .    Family and Social History: Patient endorse mother has depression and history of psychiatric inpatient treatment.  Patient is born and raised in [place].  He has a good childhood.  He lives with his wife and his 3 children.  He has good social network.  He is employed .  He denies any history of abuse in the past.  Alcohol and substance use history. Patient admitted history of DWI and drinking alcohol in the past however claims to be sober since he is taking Ritalin.  Outpatient Encounter Prescriptions as of 06/29/2014  Medication Sig  . amLODipine (NORVASC) 5 MG tablet TAKE 1 TABLET BY  MOUTH DAILY.  Marland Kitchen aspirin EC 81 MG tablet Take 81 mg by mouth daily.  Marland Kitchen buPROPion (WELLBUTRIN XL) 300 MG 24 hr tablet Take 1 tablet by mouth  daily with breakfast  . carvedilol (COREG) 3.125 MG tablet Take 1 tablet (3.125 mg  total) by mouth 2 times  daily with a meal.  . clindamycin (CLEOCIN T) 1 % external solution Apply topically 2 (two) times daily.  . fluticasone (FLONASE) 50 MCG/ACT nasal spray Place 2 sprays into both nostrils daily.  Marland Kitchen lamoTRIgine (LAMICTAL) 150 MG tablet Take 1 tablet (150 mg total) by mouth 2 (two) times daily.  . methylphenidate (RITALIN) 20 MG tablet Take one in AM, noon and afternoon  . omeprazole (PRILOSEC) 20 MG capsule Take 1 capsule by mouth  daily  . ramipril (ALTACE) 10 MG capsule Take 1 capsule by mouth  daily  . rosuvastatin (CRESTOR) 10 MG tablet  Take 1 tablet (10 mg total) by mouth daily.  . [DISCONTINUED] lamoTRIgine (LAMICTAL) 150 MG tablet Take 1 tablet (150 mg total) by mouth 2 (two) times daily.  . [DISCONTINUED] methylphenidate (RITALIN) 20 MG tablet Take one in AM, noon and afternoon  . [DISCONTINUED] methylphenidate (RITALIN) 20 MG tablet Take one in AM, noon and afternoon  . [DISCONTINUED] methylphenidate (RITALIN) 20 MG tablet Take one in AM, noon and afternoon    Past Psychiatric History/Hospitalization(s): Anxiety: Yes Bipolar Disorder:  No Depression: Patient has history of depression and mood swing for a long time.  He start taking psychotropic medication by his primary care physician Dr. Unice Vang.  In the past he was given Prozac but limited response.   Mania: No Psychosis: No Schizophrenia: No Personality Disorder: No Hospitalization for psychiatric illness: No History of Electroconvulsive Shock Therapy: No Prior Suicide Attempts: No  Physical Exam: Constitutional:  BP 140/82 mmHg  Pulse 82  Ht 6\' 1"  (1.854 m)  Wt 236 lb 12.8 oz (107.412 kg)  BMI 31.25 kg/m2  General Appearance: alert, oriented, no acute distress and well nourished  No results found for this or any previous visit (from the past 2160 hour(s)). Musculoskeletal: Strength & Muscle Tone: within normal limits Gait & Station: normal Patient leans: N/A  Psychiatric: Speech (describe rate, volume, coherence, spontaneity, and abnormalities if any): clear and coherent with normal tone volume.  Thought Process (describe rate, content, abstract reasoning, and computation): Logical and goal-directed.  No flight of ideas or any loose association.  Associations: Coherent, Relevant and Intact  Thoughts: normal  Mental Status: Orientation: oriented to person, place, time/date and situation Mood & Affect: normal affect Attention Span & Concentration: Good  Established Problem, Stable/Improving (1), Review of Last Therapy Session (1) and Review of Medication Regimen & Side Effects (2)  Assessment: Axis I: Mood disorder NOS, attention deficit disorder.  Axis II: Deferred  Axis III:  Patient Active Problem List   Diagnosis Date Noted  . Diverticulitis of colon with perforation 03/29/2013  . Pancolonic diverticulosis 09/09/2012  . Diverticulitis of jejunum with microperforation s/p lap resection [id number] 08/28/2012  . Constipation 08/27/2012  . ADD 11/20/2007  . COLONIC POLYPS, HX OF 11/20/2007  . HYPERLIPIDEMIA 06/12/2007  . HYPERTENSION  06/12/2007  . HEMORRHOIDS, INTERNAL 11/27/2006  . G E R D 09/19/2006    Plan:  Patient is stable on his medication.  He has no side effects.  He does not ask early refills for his stimulants.  I will continue Ritalin 20 mg in the morning , noon and in afternoon, continue Lamictal 150 mg twice a day and Wellbutrin XL 300 mg daily.  Patient does not have any rash itching or any other side effects. Recommended to call us back if he has any question or any concern.  Follow-up in 3 months.     Kimara Bencomo T., MD 06/29/2014

## 2014-07-01 ENCOUNTER — Telehealth (HOSPITAL_COMMUNITY): Payer: Self-pay | Admitting: *Deleted

## 2014-07-01 NOTE — Telephone Encounter (Signed)
Called to r/s appointment for September 19, 2014. Patient requested appointment in  August.

## 2014-08-22 ENCOUNTER — Encounter: Payer: Self-pay | Admitting: Internal Medicine

## 2014-08-22 ENCOUNTER — Other Ambulatory Visit (INDEPENDENT_AMBULATORY_CARE_PROVIDER_SITE_OTHER): Payer: 59

## 2014-08-22 ENCOUNTER — Ambulatory Visit (INDEPENDENT_AMBULATORY_CARE_PROVIDER_SITE_OTHER): Payer: 59 | Admitting: Internal Medicine

## 2014-08-22 ENCOUNTER — Other Ambulatory Visit: Payer: Self-pay | Admitting: Internal Medicine

## 2014-08-22 VITALS — BP 138/90 | HR 83 | Temp 98.3°F | Resp 18 | Ht 73.0 in | Wt 234.0 lb

## 2014-08-22 DIAGNOSIS — E785 Hyperlipidemia, unspecified: Secondary | ICD-10-CM | POA: Diagnosis not present

## 2014-08-22 DIAGNOSIS — Z0189 Encounter for other specified special examinations: Secondary | ICD-10-CM

## 2014-08-22 DIAGNOSIS — K5712 Diverticulitis of small intestine without perforation or abscess without bleeding: Secondary | ICD-10-CM

## 2014-08-22 DIAGNOSIS — Z0100 Encounter for examination of eyes and vision without abnormal findings: Secondary | ICD-10-CM | POA: Diagnosis not present

## 2014-08-22 DIAGNOSIS — Z0000 Encounter for general adult medical examination without abnormal findings: Secondary | ICD-10-CM

## 2014-08-22 LAB — BASIC METABOLIC PANEL
BUN: 14 mg/dL (ref 6–23)
CO2: 26 meq/L (ref 19–32)
CREATININE: 0.86 mg/dL (ref 0.40–1.50)
Calcium: 9.5 mg/dL (ref 8.4–10.5)
Chloride: 106 mEq/L (ref 96–112)
GFR: 100.36 mL/min (ref >60.00)
GLUCOSE: 116 mg/dL — AB (ref 70–99)
Potassium: 4.9 mEq/L (ref 3.5–5.1)
SODIUM: 138 meq/L (ref 135–145)

## 2014-08-22 LAB — HEPATIC FUNCTION PANEL
ALBUMIN: 4.7 g/dL (ref 3.5–5.2)
ALT: 33 U/L (ref 0–53)
AST: 19 U/L (ref 0–37)
Alkaline Phosphatase: 83 U/L (ref 39–117)
Bilirubin, Direct: 0 mg/dL (ref 0.0–0.3)
Total Bilirubin: 0.3 mg/dL (ref 0.2–1.2)
Total Protein: 7 g/dL (ref 6.0–8.3)

## 2014-08-22 LAB — CBC WITH DIFFERENTIAL/PLATELET
Basophils Absolute: 0.1 10*3/uL (ref 0.0–0.1)
Basophils Relative: 0.8 % (ref 0.0–3.0)
EOS PCT: 5.4 % — AB (ref 0.0–5.0)
Eosinophils Absolute: 0.5 10*3/uL (ref 0.0–0.7)
HCT: 45.6 % (ref 39.0–52.0)
Hemoglobin: 15.4 g/dL (ref 13.0–17.0)
LYMPHS PCT: 34.9 % (ref 12.0–46.0)
Lymphs Abs: 3.2 10*3/uL (ref 0.7–4.0)
MCHC: 33.8 g/dL (ref 30.0–36.0)
MCV: 88.5 fl (ref 78.0–100.0)
Monocytes Absolute: 0.8 10*3/uL (ref 0.1–1.0)
Monocytes Relative: 8.8 % (ref 3.0–12.0)
NEUTROS PCT: 50.1 % (ref 43.0–77.0)
Neutro Abs: 4.7 10*3/uL (ref 1.4–7.7)
Platelets: 196 10*3/uL (ref 150.0–400.0)
RBC: 5.16 Mil/uL (ref 4.22–5.81)
RDW: 13.2 % (ref 11.5–15.5)
WBC: 9.3 10*3/uL (ref 4.0–10.5)

## 2014-08-22 LAB — TSH: TSH: 1.64 u[IU]/mL (ref 0.35–4.50)

## 2014-08-22 NOTE — Patient Instructions (Signed)
Minimal Blood Pressure Goal= AVERAGE < 140/90;  Ideal is an AVERAGE < 135/85. This AVERAGE should be calculated from @ least 5-7 BP readings taken @ different times of day on different days of week. You should not respond to isolated BP readings , but rather the AVERAGE for that week .Please bring your  blood pressure cuff to office visits to verify that it is reliable.It  can also be checked against the blood pressure device at the pharmacy. Finger or wrist cuffs are not dependable; an arm cuff is. Your next office appointment will be determined based upon review of your pending labs  x-rays. Those instructions will be transmitted to you through My Chart Critical values will be called. Followup as needed for any active or acute issue. Please report any significant change in your symptoms.

## 2014-08-22 NOTE — Progress Notes (Signed)
Subjective:    Patient ID: Dakota Vang, male    DOB: [birth year], 49 y.o.   MRN: [id number]  HPI He is here for a physical;acute issues denied.  He does not follow a heart healthy diet but does not add salt at the table. His only exercise consists of on-the-job activities as well as being active with his 16,14, &11 sons on the weekends.  He has been compliant with his medications without adverse effects. He monitors blood pressure  @ work; it averages less than 140/90.  He has a history of diverticulitis; he's asymptomatic at present. He feels the most important intervention was being careful when ingesting peanuts. He believes he is due for follow-up colonoscopy by Dr. Fuller Plan.  His only active symptom is nocturia once nightly.  Family history includes myocardial infarction in both grandfathers over the age of 28. His father had colon cancer as well as mesothelioma.  His ADD is well controlled with the medications prescribed by Dr. Adele Schilder.  Review of Systems . Chest pain, palpitations, tachycardia, exertional dyspnea, paroxysmal nocturnal dyspnea, claudication or edema are absent. No unexplained weight loss, abdominal pain, significant dyspepsia, dysphagia, melena, rectal bleeding, or persistently small caliber stools. Dysuria, pyuria, hematuria, frequency, or polyuria are denied. Change in hair, skin, nails denied. No bowel changes of constipation or diarrhea. No intolerance to heat or cold.      Objective:   Physical Exam Gen.: Adequately nourished in appearance. Alert, appropriate and cooperative throughout exam. BMI: Appears younger than stated age  Head: Normocephalic without obvious abnormalities;  Moustache & beard. Eyes: No corneal or conjunctival inflammation noted. Pupils equal round reactive to light and accommodation. Extraocular motion intact.  Ears: External  ear exam reveals no significant lesions or deformities. Canals clear .TMs normal. Hearing is grossly normal  bilaterally. Nose: External nasal exam reveals no deformity or inflammation. Nasal mucosa are pink and moist. No lesions or exudates noted.   Mouth: Oral mucosa and oropharynx reveal no lesions or exudates. Teeth in good repair. Neck: No deformities, masses, or tenderness noted. Range of motion & Thyroid normal.. Lungs: Normal respiratory effort; chest expands symmetrically. Lungs are clear to auscultation without rales, wheezes, or increased work of breathing. Heart: Normal rate and rhythm. Normal S1 and S2. No gallop, click, or rub. No murmur. Abdomen: Bowel sounds normal; abdomen soft and nontender. No masses, organomegaly or hernias noted. Genitalia: Genitalia normal except for left varices. Prostate is normal without enlargement, asymmetry, nodularity, or induration                               Musculoskeletal/extremities: No deformity or scoliosis noted of  the thoracic or lumbar spine.  No clubbing, cyanosis, edema, or significant extremity  deformity noted.  Range of motion normal . Tone & strength normal. Hand joints normal.  Fingernail  health good. Minor crepitus of knees  Able to lie down & sit up w/o help.  Negative SLR bilaterally Vascular: Carotid, radial artery, dorsalis pedis and  posterior tibial pulses are full and equal. No bruits present. Neurologic: Alert and oriented x3. Deep tendon reflexes symmetrical and normal.  Gait normal      Skin: Intact without suspicious lesions or rashes. Lymph: No cervical, axillary, or inguinal lymphadenopathy present. Psych: Mood and affect are normal. Normally interactive  Assessment & Plan:  #1 comprehensive physical exam; no acute findings  Plan: see Orders  & Recommendations

## 2014-08-22 NOTE — Progress Notes (Signed)
Pre visit review using our clinic review tool, if applicable. No additional management support is needed unless otherwise documented below in the visit note. 

## 2014-08-24 LAB — NMR LIPOPROFILE WITH LIPIDS
Cholesterol, Total: 182 mg/dL (ref 100–199)
HDL Particle Number: 25.2 umol/L — ABNORMAL LOW (ref >=30.5)
HDL Size: 8.6 nm — ABNORMAL LOW (ref >=9.2)
HDL-C: 30 mg/dL — ABNORMAL LOW (ref >39)
LARGE HDL: 1.7 umol/L — AB (ref >=4.8)
LDL (calc): 88 mg/dL (ref 0–99)
LDL Particle Number: 1800 nmol/L — ABNORMAL HIGH (ref <1000)
LDL Size: 20.4 nm (ref >=20.8)
LP-IR Score: 92 — ABNORMAL HIGH (ref <=45)
Large VLDL-P: 14.6 nmol/L — ABNORMAL HIGH (ref <=2.7)
Small LDL Particle Number: 1027 nmol/L — ABNORMAL HIGH (ref <=527)
TRIGLYCERIDES: 318 mg/dL — AB (ref 0–149)
VLDL Size: 58.8 nm — ABNORMAL HIGH (ref <=46.6)

## 2014-08-26 ENCOUNTER — Telehealth: Payer: Self-pay | Admitting: Emergency Medicine

## 2014-08-26 ENCOUNTER — Other Ambulatory Visit (INDEPENDENT_AMBULATORY_CARE_PROVIDER_SITE_OTHER): Payer: 59

## 2014-08-26 DIAGNOSIS — R739 Hyperglycemia, unspecified: Secondary | ICD-10-CM

## 2014-08-26 LAB — HEMOGLOBIN A1C: HEMOGLOBIN A1C: 5.7 % (ref 4.6–6.5)

## 2014-08-26 NOTE — Telephone Encounter (Signed)
A1c added ?

## 2014-09-02 ENCOUNTER — Other Ambulatory Visit: Payer: Self-pay | Admitting: Internal Medicine

## 2014-09-19 ENCOUNTER — Other Ambulatory Visit (HOSPITAL_COMMUNITY): Payer: Self-pay | Admitting: Psychiatry

## 2014-09-19 ENCOUNTER — Encounter: Payer: Self-pay | Admitting: Internal Medicine

## 2014-09-19 ENCOUNTER — Ambulatory Visit (HOSPITAL_COMMUNITY): Payer: Self-pay | Admitting: Psychiatry

## 2014-09-19 ENCOUNTER — Other Ambulatory Visit: Payer: Self-pay | Admitting: Emergency Medicine

## 2014-09-19 DIAGNOSIS — F330 Major depressive disorder, recurrent, mild: Secondary | ICD-10-CM

## 2014-09-19 MED ORDER — CARVEDILOL 3.125 MG PO TABS: ORAL_TABLET | ORAL | Status: DC

## 2014-09-21 NOTE — Telephone Encounter (Signed)
Met with Dr.Tadepalli who authorized a 90 day refill of patient's needed Wellbutrin as patient's last appointment from 09/19/14 was rescheduled to 10/10/14.

## 2014-09-28 ENCOUNTER — Other Ambulatory Visit (HOSPITAL_COMMUNITY): Payer: Self-pay | Admitting: Psychiatry

## 2014-09-28 ENCOUNTER — Telehealth (HOSPITAL_COMMUNITY): Payer: Self-pay

## 2014-09-28 DIAGNOSIS — F330 Major depressive disorder, recurrent, mild: Secondary | ICD-10-CM

## 2014-09-28 DIAGNOSIS — F988 Other specified behavioral and emotional disorders with onset usually occurring in childhood and adolescence: Secondary | ICD-10-CM

## 2014-09-28 MED ORDER — METHYLPHENIDATE HCL 20 MG PO TABS: ORAL_TABLET | ORAL | Status: DC

## 2014-09-28 MED ORDER — LAMOTRIGINE 150 MG PO TABS: 150.0000 mg | ORAL_TABLET | Freq: Two times a day (BID) | ORAL | Status: DC

## 2014-09-28 NOTE — Telephone Encounter (Signed)
done

## 2014-09-28 NOTE — Telephone Encounter (Signed)
Medication refill requests - patient left a message he is in need of new Ritalin and Lamictal orders.  Next appointment now set for 10/26/14 as he as rescheduled from 10/10/14 and 09/19/14 due to provider out of town those dates.

## 2014-10-06 ENCOUNTER — Telehealth (HOSPITAL_COMMUNITY): Payer: Self-pay

## 2014-10-06 NOTE — Telephone Encounter (Signed)
Dakota Vang ,spouse picked up prescription on 3/96/88  Lic [id number]

## 2014-10-10 ENCOUNTER — Ambulatory Visit (HOSPITAL_COMMUNITY): Payer: Self-pay | Admitting: Psychiatry

## 2014-10-26 ENCOUNTER — Ambulatory Visit (HOSPITAL_COMMUNITY): Payer: Self-pay | Admitting: Psychiatry

## 2014-10-31 ENCOUNTER — Telehealth (HOSPITAL_COMMUNITY): Payer: Self-pay

## 2014-10-31 ENCOUNTER — Encounter: Payer: Self-pay | Admitting: Internal Medicine

## 2014-10-31 ENCOUNTER — Other Ambulatory Visit: Payer: Self-pay | Admitting: Emergency Medicine

## 2014-10-31 DIAGNOSIS — E785 Hyperlipidemia, unspecified: Secondary | ICD-10-CM

## 2014-10-31 MED ORDER — ROSUVASTATIN CALCIUM 10 MG PO TABS: 10.0000 mg | ORAL_TABLET | Freq: Every day | ORAL | Status: DC

## 2014-10-31 NOTE — Telephone Encounter (Signed)
Medication management - refill requested for patient's methylphenidate (RITALIN) 20 MG tablet as was able to last fill after 09/28/14. Patient was scheduled for evaluation on 10/26/14 but was moved back due to provider out until 11/09/14.  Patient needs refill now and would like to pick up on 11/01/14.

## 2014-11-01 ENCOUNTER — Other Ambulatory Visit (HOSPITAL_COMMUNITY): Payer: Self-pay | Admitting: Psychiatry

## 2014-11-01 DIAGNOSIS — F988 Other specified behavioral and emotional disorders with onset usually occurring in childhood and adolescence: Secondary | ICD-10-CM

## 2014-11-01 MED ORDER — METHYLPHENIDATE HCL 20 MG PO TABS: ORAL_TABLET | ORAL | Status: DC

## 2014-11-01 NOTE — Telephone Encounter (Signed)
Medication refill - called patient to inform his Ritalin prescription was prepared for pick up.

## 2014-11-03 ENCOUNTER — Telehealth (HOSPITAL_COMMUNITY): Payer: Self-pay

## 2014-11-03 NOTE — Telephone Encounter (Signed)
Elmo Putt, wife picked up prescription on 3/33/54  Lic  [id number]  dlo

## 2014-11-09 ENCOUNTER — Encounter (HOSPITAL_COMMUNITY): Payer: Self-pay | Admitting: Psychiatry

## 2014-11-09 ENCOUNTER — Ambulatory Visit (INDEPENDENT_AMBULATORY_CARE_PROVIDER_SITE_OTHER): Payer: 59 | Admitting: Psychiatry

## 2014-11-09 VITALS — BP 131/87 | HR 96 | Ht 73.0 in | Wt 233.6 lb

## 2014-11-09 DIAGNOSIS — F330 Major depressive disorder, recurrent, mild: Secondary | ICD-10-CM

## 2014-11-09 DIAGNOSIS — F988 Other specified behavioral and emotional disorders with onset usually occurring in childhood and adolescence: Secondary | ICD-10-CM

## 2014-11-09 DIAGNOSIS — F39 Unspecified mood [affective] disorder: Secondary | ICD-10-CM

## 2014-11-09 DIAGNOSIS — F909 Attention-deficit hyperactivity disorder, unspecified type: Secondary | ICD-10-CM | POA: Diagnosis not present

## 2014-11-09 MED ORDER — LAMOTRIGINE 150 MG PO TABS: 150.0000 mg | ORAL_TABLET | Freq: Two times a day (BID) | ORAL | Status: DC

## 2014-11-09 MED ORDER — METHYLPHENIDATE HCL 20 MG PO TABS: ORAL_TABLET | ORAL | Status: DC

## 2014-11-09 MED ORDER — BUPROPION HCL ER (XL) 300 MG PO TB24: ORAL_TABLET | ORAL | Status: DC

## 2014-11-09 NOTE — Progress Notes (Signed)
[facility] Progress Note  Dakota Vang [id number] 49 y.o.  11/09/2014 4:33 PM  Chief Complaint:  Medication management and followup.      History of Present Illness:  Dakota Vang came for his followup appointment.  He had a good summer.  He enjoyed spending time with the family member.  His 49 year old start driving.  Patient is taking his medication without any side effects.  He saw last time Dr. Linna Vang since his physician is retiring.  He will see a male physician who is also see his wife .  Overall he described his mood good.  He denies any irritability, anger, mood swing.  His sleep is good.  He is able to do multitasking.  He has no tremors shakes or any crying spells.  His job is going very well.  He has no issues.  He denies any rash itching or any headaches.  He has no paranoia or any hallucination.  Patient denies drinking or using any illegal substances.  Patient lives with his wife and 3 children .  Suicidal Ideation: No Plan Formed: No Patient has means to carry out plan: No  Homicidal Ideation: No Plan Formed: No Patient has means to carry out plan: No  Review of Systems  Constitutional: Negative.   Cardiovascular: Negative for palpitations.  Skin: Negative for itching and rash.  Neurological: Negative for tremors.  Psychiatric/Behavioral: Negative for suicidal ideas, hallucinations and substance abuse.   Psychiatric: Agitation: No Hallucination: No Depressed Mood: No Insomnia: No Hypersomnia: No Altered Concentration: No Feels Worthless: No Grandiose Ideas: No Belief In Special Powers: No New/Increased Substance Abuse: No Compulsions: No  Neurologic: Headache: No Seizure: No Paresthesias: No  Medical History. Patient has history of diverticulitis, hyperlipidemia, hypertension, back pain and GERD.  His primary care physician is Dakota Vang .    Family and Social History: Patient endorse mother has depression and history of psychiatric  inpatient treatment.  Patient is born and raised in [place].  He has a good childhood.  He lives with his wife and his 3 children.  He has good social network.  He is employed .  He denies any history of abuse in the past.  Alcohol and substance use history. Patient admitted history of DWI and drinking alcohol in the past however claims to be sober since he is taking Ritalin.  Outpatient Encounter Prescriptions as of 11/09/2014  Medication Sig  . amLODipine (NORVASC) 5 MG tablet TAKE 1 TABLET BY  MOUTH DAILY.  Marland Kitchen aspirin EC 81 MG tablet Take 81 mg by mouth daily.  Marland Kitchen buPROPion (WELLBUTRIN XL) 300 MG 24 hr tablet Take 1 tablet by mouth  daily with breakfast  . carvedilol (COREG) 3.125 MG tablet Take 1 tablet (3.125 mg  total) by mouth 2 times  daily with a meal.  . clindamycin (CLEOCIN T) 1 % external solution Apply topically 2 (two) times daily.  . fluticasone (FLONASE) 50 MCG/ACT nasal spray Place 2 sprays into both nostrils daily.  Marland Kitchen lamoTRIgine (LAMICTAL) 150 MG tablet Take 1 tablet (150 mg total) by mouth 2 (two) times daily.  . methylphenidate (RITALIN) 20 MG tablet Take one in AM, noon and afternoon  . omeprazole (PRILOSEC) 20 MG capsule Take 1 capsule by mouth  daily  . ramipril (ALTACE) 10 MG capsule Take 1 capsule by mouth  daily  . rosuvastatin (CRESTOR) 10 MG tablet Take 1 tablet (10 mg total) by mouth daily.  . [DISCONTINUED] buPROPion (WELLBUTRIN XL) 300 MG 24 hr tablet  Take 1 tablet by mouth  daily with breakfast  . [DISCONTINUED] lamoTRIgine (LAMICTAL) 150 MG tablet Take 1 tablet (150 mg total) by mouth 2 (two) times daily.  . [DISCONTINUED] methylphenidate (RITALIN) 20 MG tablet Take one in AM, noon and afternoon  . [DISCONTINUED] methylphenidate (RITALIN) 20 MG tablet Take one in AM, noon and afternoon  . [DISCONTINUED] methylphenidate (RITALIN) 20 MG tablet Take one in AM, noon and afternoon  . [DISCONTINUED] methylphenidate (RITALIN) 20 MG tablet Take one in AM, noon and afternoon    No facility-administered encounter medications on file as of 11/09/2014.    Past Psychiatric History/Hospitalization(s): Anxiety: Yes Bipolar Disorder: No Depression: Patient has history of depression and mood swing for a long time.  He start taking psychotropic medication by his primary care physician Dr. Unice Vang.  In the past he was given Prozac but limited response.   Mania: No Psychosis: No Schizophrenia: No Personality Disorder: No Hospitalization for psychiatric illness: No History of Electroconvulsive Shock Therapy: No Prior Suicide Attempts: No  Physical Exam: Constitutional:  BP 131/87 mmHg  Pulse 96  Ht 6\' 1"  (1.854 m)  Wt 233 lb 9.6 oz (105.96 kg)  BMI 30.83 kg/m2  General Appearance: alert, oriented, no acute distress and well nourished  Recent Results (from the past 2160 hour(s))  Basic metabolic panel     Status: Abnormal   Collection Time: 08/22/14 10:23 AM  Result Value Ref Range   Sodium 138 135 - 145 mEq/L   Potassium 4.9 3.5 - 5.1 mEq/L   Chloride 106 96 - 112 mEq/L   CO2 26 19 - 32 mEq/L   Glucose, Bld 116 (H) 70 - 99 mg/dL   BUN 14 6 - 23 mg/dL   Creatinine, Ser 0.86 0.40 - 1.50 mg/dL   Calcium 9.5 8.4 - 10.5 mg/dL   GFR 100.36 >60.00 mL/min  CBC with Differential/Platelet     Status: Abnormal   Collection Time: 08/22/14 10:23 AM  Result Value Ref Range   WBC 9.3 4.0 - 10.5 K/uL   RBC 5.16 4.22 - 5.81 Mil/uL   Hemoglobin 15.4 13.0 - 17.0 g/dL   HCT 45.6 39.0 - 52.0 %   MCV 88.5 78.0 - 100.0 fl   MCHC 33.8 30.0 - 36.0 g/dL   RDW 13.2 11.5 - 15.5 %   Platelets 196.0 150.0 - 400.0 K/uL   Neutrophils Relative % 50.1 43.0 - 77.0 %   Lymphocytes Relative 34.9 12.0 - 46.0 %   Monocytes Relative 8.8 3.0 - 12.0 %   Eosinophils Relative 5.4 (H) 0.0 - 5.0 %   Basophils Relative 0.8 0.0 - 3.0 %   Neutro Abs 4.7 1.4 - 7.7 K/uL   Lymphs Abs 3.2 0.7 - 4.0 K/uL   Monocytes Absolute 0.8 0.1 - 1.0 K/uL   Eosinophils Absolute 0.5 0.0 - 0.7 K/uL    Basophils Absolute 0.1 0.0 - 0.1 K/uL  Hepatic function panel     Status: None   Collection Time: 08/22/14 10:23 AM  Result Value Ref Range   Total Bilirubin 0.3 0.2 - 1.2 mg/dL   Bilirubin, Direct 0.0 0.0 - 0.3 mg/dL   Alkaline Phosphatase 83 39 - 117 U/L   AST 19 0 - 37 U/L   ALT 33 0 - 53 U/L   Total Protein 7.0 6.0 - 8.3 g/dL   Albumin 4.7 3.5 - 5.2 g/dL  TSH     Status: None   Collection Time: 08/22/14 10:23 AM  Result Value Ref Range  TSH 1.64 0.35 - 4.50 uIU/mL  NMR Lipoprofile with Lipids     Status: Abnormal   Collection Time: 08/22/14 10:23 AM  Result Value Ref Range   LDL Particle Number 1800 (H) <1000 nmol/L    Comment:                           Low                   < 1000                           Moderate         1000 - 1299                           Borderline-High  1300 - 1599                           High             1600 - 2000                           Very High             > 2000    LDL (calc) 88 0 - 99 mg/dL    Comment:                           Optimal               <  100                           Above optimal     100 -  129                           Borderline        130 -  159                           High              160 -  189                           Very high             >  189 LDL-C is inaccurate if patient is non-fasting.    HDL-C 30 (L) >39 mg/dL   Triglycerides 318 (H) 0 - 149 mg/dL   Cholesterol, Total 182 100 - 199 mg/dL   HDL Particle Number 25.2 (L) >=30.5 umol/L   Large HDL-P 1.7 (L) >=4.8 umol/L   Large VLDL-P 14.6 (H) <=2.7 nmol/L   Small LDL Particle Number 1027 (H) <=527 nmol/L   LDL Size 20.4 >=20.8 nm   HDL Size 8.6 (L) >=9.2 nm   VLDL Size 58.8 (H) <=46.6 nm   LP-IR Score 92 (H) <=45    Comment:  ----------------------------------------------------------              INSULIN RESISTANCE / DIABETES RISK MARKERS            <--Insulin Sensitive  Insulin Resistant-->                       Percentile in Reference  Population   Large VLDL-P      Low     25th     50th     75th     High                     <0.9    0.9      2.7      6.9      >6.9   Small LDL-P       Low     25th     50th     75th     High                     <117    117      527      839      >839   Large HDL-P       High    75th     50th     25th     Low                     >7.3    7.3      4.8      3.1      <3.1   VLDL Size         Small   25th     50th     75th     Large                     <42.4   42.4     46.6     52.5     >52.5   LDL Size          Large   75th     50th     25th     Small                     >21.2   21.2     20.8     20.4     <20.4   HDL Size          Large   75th     50th     25th     Small                     >9.6    9.6      9.2      8.9      <8.9   Insulin Resistance Score    LP-IR SCORE       Low     25th     50th     75th     High                     <27     27       45       63       >63    ________________________________________________________ LP-IR Score is inaccurate if patient is non-fasting. The LP-IR score is a laboratory developed index that has been associated with insulin resistance and diabetes risk and should be used as one component of a physician's clinical assessment. Neither the LP-IR score nor the subclasses listed above  have been cleared by the [place] Food and Drug Administration.   HgB A1c     Status: None   Collection Time: 08/26/14  1:39 PM  Result Value Ref Range   Hgb A1c MFr Bld 5.7 4.6 - 6.5 %    Comment: Glycemic Control Guidelines for People with Diabetes:Non Diabetic:  <6%Goal of Therapy: <7%Additional Action Suggested:  >8%    Musculoskeletal: Strength & Muscle Tone: within normal limits Gait & Station: normal Patient leans: N/A  Psychiatric: Speech (describe rate, volume, coherence, spontaneity, and abnormalities if any): clear and coherent with normal tone volume.  Thought Process (describe rate, content, abstract reasoning, and computation): Logical and goal-directed.   No flight of ideas or any loose association.  Associations: Coherent, Relevant and Intact  Thoughts: normal  Mental Status: Orientation: oriented to person, place, time/date and situation Mood & Affect: normal affect Attention Span & Concentration: Good  Established Problem, Stable/Improving (1), Review or order clinical lab tests (1), Review of Last Therapy Session (1) and Review of Medication Regimen & Side Effects (2)  Assessment: Axis I: Mood disorder NOS, attention deficit disorder.  Axis II: Deferred  Axis III:  Patient Active Problem List   Diagnosis Date Noted  . Diverticulitis of colon with perforation 03/29/2013  . Pancolonic diverticulosis 09/09/2012  . Diverticulitis of jejunum with microperforation s/p lap resection [id number] 08/28/2012  . ADD 11/20/2007  . COLONIC POLYPS, HX OF 11/20/2007  . Hyperlipidemia 06/12/2007  . HYPERTENSION 06/12/2007  . HEMORRHOIDS, INTERNAL 11/27/2006  . G E R D 09/19/2006    Plan:  I review blood work results.  His hemoglobin A1c is normal.  He has no side effects from medication.  I will continue Ritalin 20 mg in the morning , noon and in afternoon, continue Lamictal 150 mg twice a day and Wellbutrin XL 300 mg daily.  Patient does not have any rash itching or any other side effects. Recommended to call us back if he has any question or any concern.  Follow-up in 3 months.     Rise Traeger T., MD 11/09/2014

## 2014-11-14 ENCOUNTER — Other Ambulatory Visit (HOSPITAL_COMMUNITY): Payer: Self-pay | Admitting: Psychiatry

## 2014-11-21 ENCOUNTER — Encounter: Payer: Self-pay | Admitting: Internal Medicine

## 2014-11-23 MED ORDER — CLINDAMYCIN PHOSPHATE 1 % EX SOLN: Freq: Two times a day (BID) | CUTANEOUS | Status: DC

## 2014-11-23 MED ORDER — FLUTICASONE PROPIONATE 50 MCG/ACT NA SUSP: 2.0000 | Freq: Every day | NASAL | Status: DC

## 2014-11-30 ENCOUNTER — Encounter: Payer: Self-pay | Admitting: Gastroenterology

## 2014-12-11 ENCOUNTER — Other Ambulatory Visit: Payer: Self-pay | Admitting: Internal Medicine

## 2015-01-12 ENCOUNTER — Other Ambulatory Visit (HOSPITAL_COMMUNITY): Payer: Self-pay | Admitting: Psychiatry

## 2015-01-12 NOTE — Telephone Encounter (Signed)
Given on 11/15/14 for 90 days  Too soon to refill

## 2015-02-01 ENCOUNTER — Encounter: Payer: Self-pay | Admitting: Internal Medicine

## 2015-02-01 ENCOUNTER — Other Ambulatory Visit: Payer: Self-pay | Admitting: Emergency Medicine

## 2015-02-01 DIAGNOSIS — E785 Hyperlipidemia, unspecified: Secondary | ICD-10-CM

## 2015-02-01 MED ORDER — RAMIPRIL 10 MG PO CAPS: ORAL_CAPSULE | ORAL | Status: DC

## 2015-02-01 MED ORDER — OMEPRAZOLE 20 MG PO CPDR: DELAYED_RELEASE_CAPSULE | ORAL | Status: DC

## 2015-02-01 MED ORDER — ROSUVASTATIN CALCIUM 10 MG PO TABS: 10.0000 mg | ORAL_TABLET | Freq: Every day | ORAL | Status: DC

## 2015-02-08 ENCOUNTER — Encounter (HOSPITAL_COMMUNITY): Payer: Self-pay | Admitting: Psychiatry

## 2015-02-08 ENCOUNTER — Ambulatory Visit (INDEPENDENT_AMBULATORY_CARE_PROVIDER_SITE_OTHER): Payer: 59 | Admitting: Psychiatry

## 2015-02-08 VITALS — BP 138/101 | HR 88 | Ht 73.0 in | Wt 223.8 lb

## 2015-02-08 DIAGNOSIS — F330 Major depressive disorder, recurrent, mild: Secondary | ICD-10-CM | POA: Diagnosis not present

## 2015-02-08 DIAGNOSIS — F909 Attention-deficit hyperactivity disorder, unspecified type: Secondary | ICD-10-CM

## 2015-02-08 DIAGNOSIS — F988 Other specified behavioral and emotional disorders with onset usually occurring in childhood and adolescence: Secondary | ICD-10-CM

## 2015-02-08 MED ORDER — BUPROPION HCL ER (XL) 300 MG PO TB24: ORAL_TABLET | ORAL | Status: DC

## 2015-02-08 MED ORDER — LAMOTRIGINE 150 MG PO TABS: 150.0000 mg | ORAL_TABLET | Freq: Two times a day (BID) | ORAL | Status: DC

## 2015-02-08 MED ORDER — METHYLPHENIDATE HCL 20 MG PO TABS: ORAL_TABLET | ORAL | Status: DC

## 2015-02-08 NOTE — Progress Notes (Signed)
[facility] Progress Note  Dakota Vang [id number] 49 y.o.  02/08/2015 11:09 AM  Chief Complaint:  I stop taking Lamictal and Wellbutrin.  I thought it was not working but I have noticed I am more irritable and frustrated.  I get upset on my 58 year old son does not listen sometimes.        History of Present Illness:  Dakota Vang came for his followup appointment.  Patient reported that he stopped taking Lamictal and Wellbutrin 6 weeks ago because he felt he does not needed and he can do without these medication.  However he has noticed his irritability frustration and anger is coming back.  He had difficulty handling his 56 year old son who is not making good grades at school.  He admitted having arguments with him easily.  He is taking his Ritalin as prescribed which is helping his attention and focus.  Recently he had to take the test and he was relieved that he past.  He realized it was a mistake to stop the medication and now he agreed to go back on his Lamictal and Wellbutrin.  He denies any other concerns or side effects.  He had a good Thanksgiving.  He is taking time off on Christmas.  He denies any shakes, tremors, rash or any itching.  He denies any paranoia or any hallucination.  Patient denies drinking or using any illegal substances.  His sleep is good.  Patient lives with his wife and his 3 children.  Suicidal Ideation: No Plan Formed: No Patient has means to carry out plan: No  Homicidal Ideation: No Plan Formed: No Patient has means to carry out plan: No  Review of Systems  Constitutional: Negative.   Cardiovascular: Negative for palpitations.  Skin: Negative for itching and rash.  Neurological: Negative for tremors.  Psychiatric/Behavioral: Negative for suicidal ideas, hallucinations and substance abuse. The patient is nervous/anxious.        Irritability and frustration   Psychiatric: Agitation: No Hallucination: No Depressed Mood:  Irritability Insomnia: No Hypersomnia: No Altered Concentration: No Feels Worthless: No Grandiose Ideas: No Belief In Special Powers: No New/Increased Substance Abuse: No Compulsions: No  Neurologic: Headache: No Seizure: No Paresthesias: No  Medical History. Patient has history of diverticulitis, hyperlipidemia, hypertension, back pain and GERD.  His primary care physician is Unice Cobble .    Family and Social History: Patient endorse mother has depression and history of psychiatric inpatient treatment.  Patient is born and raised in [place].  He has a good childhood.  He lives with his wife and his 3 children.  He has good social network.  He is employed .  He denies any history of abuse in the past.  Alcohol and substance use history. Patient admitted history of DWI and drinking alcohol in the past however claims to be sober since he is taking Ritalin.  Outpatient Encounter Prescriptions as of 02/08/2015  Medication Sig  . amLODipine (NORVASC) 5 MG tablet Take 1 tablet by mouth  daily  . aspirin EC 81 MG tablet Take 81 mg by mouth daily.  Marland Kitchen buPROPion (WELLBUTRIN XL) 300 MG 24 hr tablet Take 1 tablet by mouth  daily with breakfast  . carvedilol (COREG) 3.125 MG tablet Take 1 tablet (3.125 mg  total) by mouth 2 times  daily with a meal.  . clindamycin (CLEOCIN T) 1 % external solution Apply topically 2 (two) times daily.  . fluticasone (FLONASE) 50 MCG/ACT nasal spray Place 2 sprays into both nostrils daily.  Marland Kitchen  lamoTRIgine (LAMICTAL) 150 MG tablet Take 1 tablet (150 mg total) by mouth 2 (two) times daily.  . methylphenidate (RITALIN) 20 MG tablet Take one in AM, noon and afternoon  . omeprazole (PRILOSEC) 20 MG capsule Take 1 capsule by mouth  daily  . ramipril (ALTACE) 10 MG capsule Take 1 capsule by mouth  daily  . rosuvastatin (CRESTOR) 10 MG tablet Take 1 tablet (10 mg total) by mouth daily.  . [DISCONTINUED] buPROPion (WELLBUTRIN XL) 300 MG 24 hr tablet Take 1 tablet by  mouth  daily with breakfast  . [DISCONTINUED] lamoTRIgine (LAMICTAL) 150 MG tablet Take 1 tablet (150 mg total) by mouth 2 (two) times daily.  . [DISCONTINUED] methylphenidate (RITALIN) 20 MG tablet Take one in AM, noon and afternoon  . [DISCONTINUED] methylphenidate (RITALIN) 20 MG tablet Take one in AM, noon and afternoon  . [DISCONTINUED] methylphenidate (RITALIN) 20 MG tablet Take one in AM, noon and afternoon   No facility-administered encounter medications on file as of 02/08/2015.    Past Psychiatric History/Hospitalization(s): Anxiety: Yes Bipolar Disorder: No Depression: Patient has history of depression and mood swing for a long time.  He start taking psychotropic medication by his primary care physician Dr. Unice Cobble.  In the past he was given Prozac but limited response.   Mania: No Psychosis: No Schizophrenia: No Personality Disorder: No Hospitalization for psychiatric illness: No History of Electroconvulsive Shock Therapy: No Prior Suicide Attempts: No  Physical Exam: Constitutional:  BP 138/101 mmHg  Pulse 88  Ht 6\' 1"  (1.854 m)  Wt 223 lb 12.8 oz (101.515 kg)  BMI 29.53 kg/m2  General Appearance: alert, oriented, no acute distress and well nourished  No results found for this or any previous visit (from the past 2160 hour(s)). Musculoskeletal: Strength & Muscle Tone: within normal limits Gait & Station: normal Patient leans: N/A  Psychiatric: Speech (describe rate, volume, coherence, spontaneity, and abnormalities if any): clear and coherent with normal tone volume.  Thought Process (describe rate, content, abstract reasoning, and computation): Logical and goal-directed.  No flight of ideas or any loose association.  Associations: Coherent, Relevant and Intact  Thoughts: normal  Mental Status: Orientation: oriented to person, place, time/date and situation Mood & Affect: labile affect Attention Span & Concentration: Good  Established Problem,  Stable/Improving (1), Review or order clinical lab tests (1), Established Problem, Worsening (2), Review of Last Therapy Session (1) and Review of Medication Regimen & Side Effects (2)  Assessment: Axis I: Mood disorder NOS, attention deficit disorder.  Axis II: Deferred  Axis III:  Patient Active Problem List   Diagnosis Date Noted  . Diverticulitis of colon with perforation 03/29/2013  . Pancolonic diverticulosis 09/09/2012  . Diverticulitis of jejunum with microperforation s/p lap resection ID:[id number] 08/28/2012  . ADD 11/20/2007  . COLONIC POLYPS, HX OF 11/20/2007  . Hyperlipidemia 06/12/2007  . HYPERTENSION 06/12/2007  . HEMORRHOIDS, INTERNAL 11/27/2006  . G E R D 09/19/2006    Plan:  Reinforce medication compliance especially he is noticing symptoms coming back.  I also discuss to consider family counseling if he continues to have issues with her son .  Patient realized and accepted and promised that he will look into family counseling or talk to his pastor if needed.  Discuss safety risk that anytime having active suicidal thoughts or homicidal thoughts or aggressive behavior get out of control that he should call 911 or go to the local emergency room.  Recommended if he has any side effects but she rash  with the Lamictal that he need to call us immediately.  I will see him again in 3 months.  He will go back on Lamictal 150 mg twice a day, Wellbutrin XL 300 mg daily and Ritalin 20 mg in the morning noon and afternoon .      Cheyne Bungert T., MD 02/08/2015

## 2015-04-03 ENCOUNTER — Other Ambulatory Visit (HOSPITAL_COMMUNITY): Payer: Self-pay | Admitting: Psychiatry

## 2015-04-03 NOTE — Telephone Encounter (Signed)
Given 11/30 with 2 refills. Too soon to refill

## 2015-05-10 ENCOUNTER — Encounter (HOSPITAL_COMMUNITY): Payer: Self-pay | Admitting: Psychiatry

## 2015-05-10 ENCOUNTER — Ambulatory Visit (HOSPITAL_COMMUNITY): Payer: Self-pay | Admitting: Psychiatry

## 2015-05-10 ENCOUNTER — Ambulatory Visit (INDEPENDENT_AMBULATORY_CARE_PROVIDER_SITE_OTHER): Payer: 59 | Admitting: Psychiatry

## 2015-05-10 VITALS — BP 129/83 | HR 109 | Ht 73.0 in | Wt 217.6 lb

## 2015-05-10 DIAGNOSIS — F330 Major depressive disorder, recurrent, mild: Secondary | ICD-10-CM | POA: Diagnosis not present

## 2015-05-10 DIAGNOSIS — F909 Attention-deficit hyperactivity disorder, unspecified type: Secondary | ICD-10-CM | POA: Diagnosis not present

## 2015-05-10 DIAGNOSIS — F988 Other specified behavioral and emotional disorders with onset usually occurring in childhood and adolescence: Secondary | ICD-10-CM

## 2015-05-10 MED ORDER — LAMOTRIGINE 150 MG PO TABS: 150.0000 mg | ORAL_TABLET | Freq: Two times a day (BID) | ORAL | Status: DC

## 2015-05-10 MED ORDER — METHYLPHENIDATE HCL 20 MG PO TABS: ORAL_TABLET | ORAL | Status: DC

## 2015-05-10 MED ORDER — DULOXETINE HCL 30 MG PO CPEP: ORAL_CAPSULE | ORAL | Status: DC

## 2015-05-10 NOTE — Progress Notes (Signed)
[facility] Progress Note  ALBARAA BUNTAIN [id number] 50 y.o.  05/10/2015 3:10 PM  Chief Complaint:  I am taking Lamictal and Wellbutrin but I still feel anxious irritable and frustrated.  I'm concerned my 82 year old son skipping schools.  I don't know how to handle it.    History of Present Illness:  Aramis came for his followup appointment.  On his last visit he was complaining of irritability and frustration and agitation and he stopped taking the Lamictal .  He was restarted on Lamictal and he has noticed some improvement but he still have anxiety and feeling overwhelmed.  He admitted to a lot of family issues.  His 64 year-old son is a skipping school and then he tried to discipline him , his son gets upset.  Patient told his wife also noticed that he is losing his temper .  Recently he decided to see a family counselor and hoping things getting better.  He admitted sleeping too much .  Sometime he does not like to be in conversation with the family members.  She gets tired, isolated, withdrawn .  She is very nervous and anxious about his son's future .  He is taking Lamictal and Wellbutrin.  He has no tremors or shakes.  He is taking Ritalin 20 mg 3 times a day.  He is glad that his blood pressure is finally under control.  His attention and focus is good.  He admitted mornings are good but afternoon and evening he has no energy.  He denies any rash, itching, headaches or any EPS.  He denies drinking or using any illegal substances.  His appetite is okay.  His vitals are stable.  He lives with his wife and 3 children.  He mentioned his job is going well and there has been no new issues.  Suicidal Ideation: No Plan Formed: No Patient has means to carry out plan: No  Homicidal Ideation: No Plan Formed: No Patient has means to carry out plan: No  Review of Systems  Constitutional: Negative.   Cardiovascular: Negative for palpitations.  Skin: Negative for itching and rash.   Neurological: Negative for tremors.  Psychiatric/Behavioral: Negative for suicidal ideas, hallucinations and substance abuse. The patient is nervous/anxious.        Irritability and frustration   Psychiatric: Agitation: No Hallucination: No Depressed Mood: Irritability Insomnia: No Hypersomnia: Yes Altered Concentration: No Feels Worthless: No Grandiose Ideas: No Belief In Special Powers: No New/Increased Substance Abuse: No Compulsions: No  Neurologic: Headache: No Seizure: No Paresthesias: No  Medical History. Patient has history of diverticulitis, hyperlipidemia, hypertension, back pain and GERD.  His primary care physician is Unice Cobble .    Family and Social History: Patient endorse mother has depression and history of psychiatric inpatient treatment.  Patient is born and raised in [place].  He has a good childhood.  He lives with his wife and his 3 children.  He has good social network.  He is employed .  He denies any history of abuse in the past.  Alcohol and substance use history. Patient admitted history of DWI and drinking alcohol in the past however claims to be sober since he is taking Ritalin.  Outpatient Encounter Prescriptions as of 05/10/2015  Medication Sig  . amLODipine (NORVASC) 5 MG tablet Take 1 tablet by mouth  daily  . aspirin EC 81 MG tablet Take 81 mg by mouth daily.  . carvedilol (COREG) 3.125 MG tablet Take 1 tablet (3.125 mg  total) by mouth 2 times  daily with a meal.  . clindamycin (CLEOCIN T) 1 % external solution Apply topically 2 (two) times daily.  . DULoxetine (CYMBALTA) 30 MG capsule Take 1 capsule for 2 week and than 2 daily  . fluticasone (FLONASE) 50 MCG/ACT nasal spray Place 2 sprays into both nostrils daily.  Marland Kitchen lamoTRIgine (LAMICTAL) 150 MG tablet Take 1 tablet (150 mg total) by mouth 2 (two) times daily.  . methylphenidate (RITALIN) 20 MG tablet Take one in AM, noon and afternoon  . omeprazole (PRILOSEC) 20 MG capsule Take 1 capsule  by mouth  daily  . ramipril (ALTACE) 10 MG capsule Take 1 capsule by mouth  daily  . rosuvastatin (CRESTOR) 10 MG tablet Take 1 tablet (10 mg total) by mouth daily.  . [DISCONTINUED] buPROPion (WELLBUTRIN XL) 300 MG 24 hr tablet Take 1 tablet by mouth  daily with breakfast  . [DISCONTINUED] lamoTRIgine (LAMICTAL) 150 MG tablet Take 1 tablet (150 mg total) by mouth 2 (two) times daily.  . [DISCONTINUED] methylphenidate (RITALIN) 20 MG tablet Take one in AM, noon and afternoon  . [DISCONTINUED] methylphenidate (RITALIN) 20 MG tablet Take one in AM, noon and afternoon   No facility-administered encounter medications on file as of 05/10/2015.    Past Psychiatric History/Hospitalization(s): Anxiety: Yes Bipolar Disorder: No Depression: Patient has history of depression and mood swing for a long time.  He start taking psychotropic medication by his primary care physician Dr. Unice Cobble.  In the past he was given Prozac but limited response.   Mania: No Psychosis: No Schizophrenia: No Personality Disorder: No Hospitalization for psychiatric illness: No History of Electroconvulsive Shock Therapy: No Prior Suicide Attempts: No  Physical Exam: Constitutional:  BP 129/83 mmHg  Pulse 109  Ht 6\' 1"  (1.854 m)  Wt 217 lb 9.6 oz (98.703 kg)  BMI 28.72 kg/m2  General Appearance: alert, oriented, no acute distress and well nourished  No results found for this or any previous visit (from the past 2160 hour(s)). Musculoskeletal: Strength & Muscle Tone: within normal limits Gait & Station: normal Patient leans: N/A  Mental status examination: Patient is casually dressed and groomed.  He maintained fair eye contact.  He appeared anxious but cooperative.  He denies any suicidal thoughts or homicidal thought.  His his speech is fluent , clear and coherent.  He denies any delusion or any paranoia.  His thought processes slow but logical and goal-directed.  He described his mood frustrated and  irritable and his affect is mood appropriate.  His fund of knowledge is adequate.  His attention and concentration is okay.  He has no tremors shakes or any EPS.  He is alert and oriented 3.  His insight judgment and impulse control is okay.  Established Problem, Stable/Improving (1), Review of Psycho-Social Stressors (1), Review or order clinical lab tests (1), Established Problem, Worsening (2), Review of Last Therapy Session (1), Review of Medication Regimen & Side Effects (2) and Review of New Medication or Change in Dosage (2)  Assessment: Axis I: Mood disorder NOS, attention deficit disorder.  Axis II: Deferred  Axis III:  Patient Active Problem List   Diagnosis Date Noted  . Diverticulitis of colon with perforation 03/29/2013  . Pancolonic diverticulosis 09/09/2012  . Diverticulitis of jejunum with microperforation s/p lap resection ID:[id number] 08/28/2012  . ADD 11/20/2007  . COLONIC POLYPS, HX OF 11/20/2007  . Hyperlipidemia 06/12/2007  . HYPERTENSION 06/12/2007  . HEMORRHOIDS, INTERNAL 11/27/2006  . G E R D  09/19/2006    Plan:  Patient is taking Wellbutrin Lamictal and Ritalin.  He continued to feel sad anxious and irritable.  I recommended to discontinue Wellbutrin after decreasing to 150 mg for 2 weeks.  We will start Cymbalta 30 mg daily and after 2 weeks he will take 60 mg daily.  Discuss in detail medication side effects and benefits.  Discussed cross taper with Wellbutrin.  Continue Lamictal 150 mg twice a day and Ritalin 20 mg in the morning, known and in the afternoon.  Recommended to call us back if he has any question or any concern.  Patient is started family counseling with Jeneen Rinks Helmick .  Encouraged to keep appointment for counseling.  Discuss safety plan that anytime having active suicidal thoughts or homicidal thoughts then he need to call 911 or go to the local emergency room.  Follow-up in 6 weeks.    ARFEEN,SYED T., MD 05/10/2015

## 2015-05-11 ENCOUNTER — Telehealth (HOSPITAL_COMMUNITY): Payer: Self-pay | Admitting: *Deleted

## 2015-05-11 ENCOUNTER — Telehealth (HOSPITAL_COMMUNITY): Payer: Self-pay

## 2015-05-11 NOTE — Telephone Encounter (Signed)
Received fax for prior authorization of Cymbalta, this was submitted on Cover My Meds and I will have a response in 1-2 days. Ref# [id number]

## 2015-05-11 NOTE — Telephone Encounter (Signed)
Prior authorization for Duloxetine received. Called [id number] was told that it was submitted online with cover my meds earlier today. Still pending pharmacist review.

## 2015-05-17 ENCOUNTER — Telehealth (HOSPITAL_COMMUNITY): Payer: Self-pay

## 2015-05-17 NOTE — Telephone Encounter (Signed)
Fax received from [facility], patients Duloxetine has been approved until 05/10/2016. I called the patient and spoke to his wife and let her know.

## 2015-06-21 ENCOUNTER — Encounter (HOSPITAL_COMMUNITY): Payer: Self-pay | Admitting: Psychiatry

## 2015-06-21 ENCOUNTER — Ambulatory Visit (INDEPENDENT_AMBULATORY_CARE_PROVIDER_SITE_OTHER): Payer: 59 | Admitting: Psychiatry

## 2015-06-21 VITALS — BP 138/82 | HR 112 | Ht 73.0 in | Wt 213.8 lb

## 2015-06-21 DIAGNOSIS — F988 Other specified behavioral and emotional disorders with onset usually occurring in childhood and adolescence: Secondary | ICD-10-CM

## 2015-06-21 DIAGNOSIS — F330 Major depressive disorder, recurrent, mild: Secondary | ICD-10-CM | POA: Diagnosis not present

## 2015-06-21 DIAGNOSIS — F909 Attention-deficit hyperactivity disorder, unspecified type: Secondary | ICD-10-CM | POA: Diagnosis not present

## 2015-06-21 MED ORDER — METHYLPHENIDATE HCL 20 MG PO TABS: ORAL_TABLET | ORAL | Status: DC

## 2015-06-21 MED ORDER — DULOXETINE HCL 30 MG PO CPEP: ORAL_CAPSULE | ORAL | Status: DC

## 2015-06-21 MED ORDER — LAMOTRIGINE 150 MG PO TABS: 150.0000 mg | ORAL_TABLET | Freq: Two times a day (BID) | ORAL | Status: DC

## 2015-06-21 NOTE — Progress Notes (Signed)
[facility] Progress Note  Dakota Vang [id number] 50 y.o.  06/21/2015 4:45 PM  Chief Complaint:  I am doing so much better on Cymbalta.  Anxiety and depression is much better.  I have more energy.      History of Present Illness:  Dakota Vang came for his followup appointment.  On his last visit we stopped his Wellbutrin and recommended to try Cymbalta.  He has seen a huge improvement in his anxiety and depression.  He is taking 60 mg.  He is wondering if the dose can be further increase.  He has no tremors, shakes, EPS or any side effects.  He continues to have hypersomnia but overall his energy level is much improved.  He tried again passing his exam but he failed by only one number.  He has not given up and he like to retake the exam.  His son is also doing very well.  He is seen chart psychiatrist who put him on Vyvanse and patient told he see a huge difference.  Patient denies any irritability, anger, mood swing.  He continues to take Ritalin 60 mg a day.  He feels that he can take a higher dose of Ritalin however he has a history of hypertension and tachycardia.  He is taking multiple medication and I discouraged him to increase the dose of stimulant.  Patient denies drinking or using any illegal substances.  He has no chest pain, headaches, itching, rash.  He is compliant with Lamictal.  He denies any mood swing anger or any hallucination.  His appetite is okay.  His vitals are stable.  Suicidal Ideation: No Plan Formed: No Patient has means to carry out plan: No  Homicidal Ideation: No Plan Formed: No Patient has means to carry out plan: No  Review of Systems  Constitutional: Negative.   Cardiovascular: Negative for palpitations.  Skin: Negative for itching and rash.  Neurological: Negative for tremors.  Psychiatric/Behavioral: Negative for suicidal ideas, hallucinations and substance abuse.   Psychiatric: Agitation: No Hallucination: No Depressed Mood:  No Insomnia: No Hypersomnia: Yes Altered Concentration: No Feels Worthless: No Grandiose Ideas: No Belief In Special Powers: No New/Increased Substance Abuse: No Compulsions: No  Neurologic: Headache: No Seizure: No Paresthesias: No  Medical History. Patient has history of diverticulitis, hyperlipidemia, hypertension, back pain and GERD.  His primary care physician is Unice Cobble .    Family and Social History: Patient endorse mother has depression and history of psychiatric inpatient treatment.  Patient is born and raised in [place].  He has a good childhood.  He lives with his wife and his 3 children.  He has good social network.  He is employed .  He denies any history of abuse in the past.  Alcohol and substance use history. Patient admitted history of DWI and drinking alcohol in the past however claims to be sober since he is taking Ritalin.  Outpatient Encounter Prescriptions as of 06/21/2015  Medication Sig  . amLODipine (NORVASC) 5 MG tablet Take 1 tablet by mouth  daily  . aspirin EC 81 MG tablet Take 81 mg by mouth daily.  . carvedilol (COREG) 3.125 MG tablet Take 1 tablet (3.125 mg  total) by mouth 2 times  daily with a meal.  . clindamycin (CLEOCIN T) 1 % external solution Apply topically 2 (two) times daily.  . DULoxetine (CYMBALTA) 30 MG capsule Take 3 capsule daily  . fluticasone (FLONASE) 50 MCG/ACT nasal spray Place 2 sprays into both nostrils daily.  Marland Kitchen  lamoTRIgine (LAMICTAL) 150 MG tablet Take 1 tablet (150 mg total) by mouth 2 (two) times daily.  . methylphenidate (RITALIN) 20 MG tablet Take one in AM, noon and afternoon  . omeprazole (PRILOSEC) 20 MG capsule Take 1 capsule by mouth  daily  . ramipril (ALTACE) 10 MG capsule Take 1 capsule by mouth  daily  . rosuvastatin (CRESTOR) 10 MG tablet Take 1 tablet (10 mg total) by mouth daily.  . [DISCONTINUED] DULoxetine (CYMBALTA) 30 MG capsule Take 1 capsule for 2 week and than 2 daily  . [DISCONTINUED] DULoxetine  (CYMBALTA) 30 MG capsule Take 3 capsule daily  . [DISCONTINUED] lamoTRIgine (LAMICTAL) 150 MG tablet Take 1 tablet (150 mg total) by mouth 2 (two) times daily.  . [DISCONTINUED] lamoTRIgine (LAMICTAL) 150 MG tablet Take 1 tablet (150 mg total) by mouth 2 (two) times daily.  . [DISCONTINUED] methylphenidate (RITALIN) 20 MG tablet Take one in AM, noon and afternoon  . [DISCONTINUED] methylphenidate (RITALIN) 20 MG tablet Take one in AM, noon and afternoon   No facility-administered encounter medications on file as of 06/21/2015.    Past Psychiatric History/Hospitalization(s): Anxiety: Yes Bipolar Disorder: No Depression: Patient has history of depression and mood swing for a long time.  He start taking psychotropic medication by his primary care physician Dr. Unice Cobble.  In the past he was given Prozac but limited response.   Mania: No Psychosis: No Schizophrenia: No Personality Disorder: No Hospitalization for psychiatric illness: No History of Electroconvulsive Shock Therapy: No Prior Suicide Attempts: No  Physical Exam: Constitutional:  BP 138/82 mmHg  Pulse 112  Ht 6\' 1"  (1.854 m)  Wt 213 lb 12.8 oz (96.979 kg)  BMI 28.21 kg/m2  General Appearance: alert, oriented, no acute distress and well nourished  No results found for this or any previous visit (from the past 2160 hour(s)). Musculoskeletal: Strength & Muscle Tone: within normal limits Gait & Station: normal Patient leans: N/A  Mental status examination: Patient is casually dressed and groomed.  He maintained fair eye contact.  He is pleasant and cooperative.  His speech is soft clear and coherent.  He denies any suicidal thoughts or homicidal thought. He denies any delusion or any paranoia.  His thought processes slow but logical and goal-directed.  He described his mood frustrated and irritable and his affect is mood appropriate.  His fund of knowledge is adequate.  His attention and concentration is okay.  He has no  tremors shakes or any EPS.  He is alert and oriented 3.  His insight judgment and impulse control is okay.  Established Problem, Stable/Improving (1), Review of Psycho-Social Stressors (1), Review of Last Therapy Session (1), Review of Medication Regimen & Side Effects (2) and Review of New Medication or Change in Dosage (2)  Assessment: Axis I: Mood disorder NOS, attention deficit disorder.  Axis II: Deferred  Axis III:  Patient Active Problem List   Diagnosis Date Noted  . Diverticulitis of colon with perforation 03/29/2013  . Pancolonic diverticulosis 09/09/2012  . Diverticulitis of jejunum with microperforation s/p lap resection ID:[id number] 08/28/2012  . ADD 11/20/2007  . COLONIC POLYPS, HX OF 11/20/2007  . Hyperlipidemia 06/12/2007  . HYPERTENSION 06/12/2007  . HEMORRHOIDS, INTERNAL 11/27/2006  . G E R D 09/19/2006    Plan:  Patient is Doing much better on Cymbalta.  He like to increase the dose and we will try 90 mg daily.  Discussed medication side effects and benefits.  Continue Ritalin 20 mg 3 times a day  and Lamictal 300 mg daily.  Recommended to call us back if he has any question or any concern.  Follow-up in 3 months. Discuss safety plan that anytime having active suicidal thoughts or homicidal thoughts then he need to call 911 or go to the local emergency room.      Johniece Hornbaker T., MD 06/21/2015

## 2015-07-22 ENCOUNTER — Other Ambulatory Visit: Payer: Self-pay | Admitting: Internal Medicine

## 2015-07-24 ENCOUNTER — Telehealth: Payer: Self-pay

## 2015-07-24 ENCOUNTER — Telehealth: Payer: Self-pay | Admitting: Family Medicine

## 2015-07-24 NOTE — Telephone Encounter (Signed)
Pt asking to trans care to Dr Birdie Riddle, please advise ok to schedule

## 2015-07-24 NOTE — Telephone Encounter (Signed)
Left message with patient's wife to call back to schedule appt/get established with new pcp---let Wilder Amodei know when patient makes appt so that refill for cholesterol med can be sent to [facility]

## 2015-07-24 NOTE — Telephone Encounter (Signed)
Patients mobile phone was not working, i called home number and left message with patient's wife, patient needs to call back to schedule appt/get established with new pcp--patient's last wellness exam was with dr hopper in June/20116----let tamara know when appt is made so that refill for crestor can be sent to pharm

## 2015-07-24 NOTE — Telephone Encounter (Signed)
Ok to schedule.

## 2015-07-24 NOTE — Telephone Encounter (Signed)
Pt has been scheduled for a NP appt.

## 2015-07-26 ENCOUNTER — Ambulatory Visit (INDEPENDENT_AMBULATORY_CARE_PROVIDER_SITE_OTHER): Payer: 59 | Admitting: Family Medicine

## 2015-07-26 ENCOUNTER — Encounter: Payer: Self-pay | Admitting: Family Medicine

## 2015-07-26 ENCOUNTER — Telehealth: Payer: Self-pay | Admitting: *Deleted

## 2015-07-26 VITALS — BP 128/86 | HR 92 | Temp 98.1°F | Resp 16 | Ht 73.0 in | Wt 217.2 lb

## 2015-07-26 DIAGNOSIS — F909 Attention-deficit hyperactivity disorder, unspecified type: Secondary | ICD-10-CM

## 2015-07-26 DIAGNOSIS — F411 Generalized anxiety disorder: Secondary | ICD-10-CM | POA: Diagnosis not present

## 2015-07-26 DIAGNOSIS — E785 Hyperlipidemia, unspecified: Secondary | ICD-10-CM

## 2015-07-26 DIAGNOSIS — I10 Essential (primary) hypertension: Secondary | ICD-10-CM

## 2015-07-26 DIAGNOSIS — F988 Other specified behavioral and emotional disorders with onset usually occurring in childhood and adolescence: Secondary | ICD-10-CM

## 2015-07-26 LAB — BASIC METABOLIC PANEL
BUN: 20 mg/dL (ref 6–23)
CHLORIDE: 104 meq/L (ref 96–112)
CO2: 27 meq/L (ref 19–32)
Calcium: 9.5 mg/dL (ref 8.4–10.5)
Creatinine, Ser: 0.98 mg/dL (ref 0.40–1.50)
GFR: 85.99 mL/min (ref >60.00)
GLUCOSE: 97 mg/dL (ref 70–99)
POTASSIUM: 4.1 meq/L (ref 3.5–5.1)
Sodium: 138 mEq/L (ref 135–145)

## 2015-07-26 LAB — CBC WITH DIFFERENTIAL/PLATELET
Basophils Absolute: 0.2 10*3/uL — ABNORMAL HIGH (ref 0.0–0.1)
Basophils Relative: 1.6 % (ref 0.0–3.0)
EOS PCT: 3 % (ref 0.0–5.0)
Eosinophils Absolute: 0.4 10*3/uL (ref 0.0–0.7)
HCT: 44.7 % (ref 39.0–52.0)
Hemoglobin: 14.9 g/dL (ref 13.0–17.0)
LYMPHS ABS: 3.7 10*3/uL (ref 0.7–4.0)
Lymphocytes Relative: 30.2 % (ref 12.0–46.0)
MCHC: 33.4 g/dL (ref 30.0–36.0)
MCV: 86.4 fl (ref 78.0–100.0)
MONO ABS: 0.9 10*3/uL (ref 0.1–1.0)
Monocytes Relative: 7 % (ref 3.0–12.0)
NEUTROS PCT: 58.2 % (ref 43.0–77.0)
Neutro Abs: 7.2 10*3/uL (ref 1.4–7.7)
Platelets: 218 10*3/uL (ref 150.0–400.0)
RBC: 5.17 Mil/uL (ref 4.22–5.81)
RDW: 14.6 % (ref 11.5–15.5)
WBC: 12.4 10*3/uL — ABNORMAL HIGH (ref 4.0–10.5)

## 2015-07-26 LAB — HEPATIC FUNCTION PANEL
ALT: 30 U/L (ref 0–53)
AST: 16 U/L (ref 0–37)
Albumin: 4.5 g/dL (ref 3.5–5.2)
Alkaline Phosphatase: 81 U/L (ref 39–117)
BILIRUBIN TOTAL: 0.3 mg/dL (ref 0.2–1.2)
Bilirubin, Direct: 0.1 mg/dL (ref 0.0–0.3)
TOTAL PROTEIN: 7 g/dL (ref 6.0–8.3)

## 2015-07-26 LAB — LDL CHOLESTEROL, DIRECT: LDL DIRECT: 94 mg/dL

## 2015-07-26 LAB — TSH: TSH: 2.52 u[IU]/mL (ref 0.35–4.50)

## 2015-07-26 LAB — LIPID PANEL
Cholesterol: 166 mg/dL (ref 0–200)
HDL: 30.8 mg/dL — AB (ref >39.00)
NonHDL: 135.04
TRIGLYCERIDES: 250 mg/dL — AB (ref 0.0–149.0)
Total CHOL/HDL Ratio: 5
VLDL: 50 mg/dL — ABNORMAL HIGH (ref 0.0–40.0)

## 2015-07-26 MED ORDER — ALPRAZOLAM 0.5 MG PO TABS: 0.5000 mg | ORAL_TABLET | Freq: Two times a day (BID) | ORAL | Status: DC | PRN

## 2015-07-26 MED ORDER — LISINOPRIL 30 MG PO TABS: 30.0000 mg | ORAL_TABLET | Freq: Every day | ORAL | Status: DC

## 2015-07-26 MED ORDER — LAMOTRIGINE 100 MG PO TABS: 100.0000 mg | ORAL_TABLET | Freq: Two times a day (BID) | ORAL | Status: DC

## 2015-07-26 NOTE — Progress Notes (Signed)
Pre visit review using our clinic review tool, if applicable. No additional management support is needed unless otherwise documented below in the visit note. 

## 2015-07-26 NOTE — Telephone Encounter (Signed)
Received fax pt is requesting refills on his Crestor 10 mg. Last filled 04/27/15. Pt is seeing Dr. Birdie Riddle now forwarding msg over...Dakota Vang

## 2015-07-26 NOTE — Patient Instructions (Signed)
Follow up in 1 month to recheck BP and mood We'll notify you of your lab results and make any changes if needed STOP the Ramipril and Coreg daily START the Lisinopril once daily instead Continue the Crestor daily Continue the Amlodipine We need to wean off the Lamictal slowly to avoid withdrawal- so decrease to 100mg  twice daily x1 month- prescription sent to pharmacy Use the Alprazolam only as needed for high anxiety moments Call with any questions or concerns Welcome!  We're glad to have you!!!

## 2015-07-26 NOTE — Assessment & Plan Note (Signed)
New to provider, ongoing for pt.  Tolerating Crestor w/o difficulty.  Discussed need for healthy diet and regular exercise.  Check labs.  Adjust meds prn

## 2015-07-26 NOTE — Telephone Encounter (Signed)
To be refilled pending lab results

## 2015-07-26 NOTE — Assessment & Plan Note (Signed)
New to provider, ongoing for pt.  On Cymbalta and Lamictal.  Pt denies being Bipolar and indicates that he was started on medication when his mother died in 2011/06/17.  Wants to stop Lamictal b/c of rash.  Will start weaning process and monitor closely for mood changes.  Pt is asking for prn Alprazolam to use in times of high anxiety.  Script provided.  Will follow closely.

## 2015-07-26 NOTE — Assessment & Plan Note (Signed)
Chronic problem.  Has been getting medication from Dr Adele Schilder.  Prefers to remain on Ritalin 3x/day as this controls his sxs.  Not interested in changing to long acting medication.  Will follow.

## 2015-07-26 NOTE — Progress Notes (Signed)
   Subjective:    Patient ID: Dakota Vang, male    DOB: 1965-05-12, 50 y.o.   MRN: [id number]  HPI New to establish.  Previous MD- Linna Darner  HTN- chronic problem, on Amlodipine, Coreg, Ramipril daily w/ adequate control.  No CP, SOB, HAs, visual changes, edema.  Only taking Coreg once daily.  Hyperlipidemia- chronic problem.  On Crestor daily.  Denies abd pain, N/V, myalgias.  Pt does not follow low cholesterol diet.  Getting some exercise.  Anxiety- chronic problem, wife is 'begging me to get xanax b/c it works perfect for her'.  Pt has tried a few of wife's pills w/ good results.  On Cymbalta 90mg  daily.  Pt is on Lamictal since death of mother in July 02, 2011.  He doesn't like the medication- states it gives him a rash.  Wants to stop medication and no longer follow with psych- 'i just want 1 doctor'.    ADHD- pt is currently on Ritalin 3x/day.  Pt reports he is able to focus, get his work done.  Is not interested in switching medications.    Review of Systems For ROS see HPI     Objective:   Physical Exam  Constitutional: He is oriented to person, place, and time. He appears well-developed and well-nourished. No distress.  HENT:  Head: Normocephalic and atraumatic.  Eyes: Conjunctivae and EOM are normal. Pupils are equal, round, and reactive to light.  Neck: Normal range of motion. Neck supple. No thyromegaly present.  Cardiovascular: Normal rate, regular rhythm, normal heart sounds and intact distal pulses.   No murmur heard. Pulmonary/Chest: Effort normal and breath sounds normal. No respiratory distress.  Abdominal: Soft. Bowel sounds are normal. He exhibits no distension.  Musculoskeletal: He exhibits no edema.  Lymphadenopathy:    He has no cervical adenopathy.  Neurological: He is alert and oriented to person, place, and time. No cranial nerve deficit.  Skin: Skin is warm and dry.  Psychiatric: He has a normal mood and affect. His behavior is normal.  Vitals  reviewed.         Assessment & Plan:

## 2015-07-26 NOTE — Assessment & Plan Note (Signed)
New to provider, ongoing for pt.  Pt reports he has only been taking low dose Coreg once daily rather than twice.  Would like to be on fewer pills if possible.  Will stop Coreg, switch Altace to Lisinopril 30mg  daily.  Pt to return in 1 month to recheck BP.  Pt expressed understanding and is in agreement w/ plan.

## 2015-07-27 MED ORDER — ROSUVASTATIN CALCIUM 10 MG PO TABS: 10.0000 mg | ORAL_TABLET | Freq: Every day | ORAL | Status: AC

## 2015-07-27 MED ORDER — OMEPRAZOLE 20 MG PO CPDR: DELAYED_RELEASE_CAPSULE | ORAL | Status: DC

## 2015-07-27 NOTE — Telephone Encounter (Signed)
Medication filled to pharmacy as requested.   

## 2015-08-03 ENCOUNTER — Other Ambulatory Visit (HOSPITAL_COMMUNITY): Payer: Self-pay | Admitting: Psychiatry

## 2015-08-04 ENCOUNTER — Other Ambulatory Visit (HOSPITAL_COMMUNITY): Payer: Self-pay | Admitting: Psychiatry

## 2015-08-04 NOTE — Telephone Encounter (Signed)
It has been discontinued

## 2015-08-09 ENCOUNTER — Telehealth: Payer: Self-pay | Admitting: Family Medicine

## 2015-08-09 MED ORDER — BUSPIRONE HCL 15 MG PO TABS: ORAL_TABLET | ORAL | Status: DC

## 2015-08-09 NOTE — Telephone Encounter (Signed)
Please make sure that pt is continuing his Cymbalta daily while weaning his Lamictal.  I do not use Alprazolam as long term anxiety management so if it's not working, we need to add another daily controller medication- Buspar 15mg , 1/2 tab twice daily x2 weeks and then increase to 1 tab BID.  Alprazolam is only to be used as a rescue medication and is NOT to be taken twice daily- the prescription says twice daily AS NEEDED.

## 2015-08-09 NOTE — Telephone Encounter (Signed)
Pt states that he is taking 2 xanax a day like Dr Birdie Riddle said to, however doesn't feel that this is working and asking if there was a way to increase this.

## 2015-08-09 NOTE — Telephone Encounter (Signed)
Called patient and LMOVM to return call.     

## 2015-08-09 NOTE — Telephone Encounter (Signed)
Pt advised of PCP recommendations, stated an understanding and Buspar filled to local pharmacy #[id number] with 1. Pt stated he is taking the cymbalta and advised that the #30 of Alprazolam is intended to last him a month not 15 days. Pt was advised that there was 1 refill remaining on the alprazolam and that it is intended to last him til the end of June. Pt stated that he understood.

## 2015-08-14 ENCOUNTER — Other Ambulatory Visit (HOSPITAL_COMMUNITY): Payer: Self-pay | Admitting: Psychiatry

## 2015-08-30 ENCOUNTER — Encounter: Payer: Self-pay | Admitting: Family Medicine

## 2015-08-30 ENCOUNTER — Ambulatory Visit (INDEPENDENT_AMBULATORY_CARE_PROVIDER_SITE_OTHER): Payer: Commercial Managed Care - HMO | Admitting: Family Medicine

## 2015-08-30 VITALS — BP 128/84 | HR 86 | Temp 98.0°F | Resp 16 | Ht 73.0 in | Wt 227.1 lb

## 2015-08-30 DIAGNOSIS — I10 Essential (primary) hypertension: Secondary | ICD-10-CM

## 2015-08-30 DIAGNOSIS — F909 Attention-deficit hyperactivity disorder, unspecified type: Secondary | ICD-10-CM

## 2015-08-30 DIAGNOSIS — F988 Other specified behavioral and emotional disorders with onset usually occurring in childhood and adolescence: Secondary | ICD-10-CM

## 2015-08-30 DIAGNOSIS — F411 Generalized anxiety disorder: Secondary | ICD-10-CM

## 2015-08-30 MED ORDER — ALPRAZOLAM 0.5 MG PO TABS: 0.5000 mg | ORAL_TABLET | Freq: Two times a day (BID) | ORAL | Status: DC | PRN

## 2015-08-30 MED ORDER — FLUOXETINE HCL 40 MG PO CAPS: 40.0000 mg | ORAL_CAPSULE | Freq: Every day | ORAL | Status: DC

## 2015-08-30 MED ORDER — METHYLPHENIDATE HCL 20 MG PO TABS: ORAL_TABLET | ORAL | Status: DC

## 2015-08-30 NOTE — Assessment & Plan Note (Signed)
Refill provided on stimulant medication- which I told pt may be worsening his anxiety but he is not willing to attempt a medication holiday at this time.

## 2015-08-30 NOTE — Assessment & Plan Note (Signed)
Ongoing issue.  Pt today is demonstrating drug seeking behavior by repeatedly asking for Alprazolam and in larger quantities than I am comfortable with.  He states the only time he feels good is when taking the Alprazolam.  I told him this was not to be used regularly and is only to help w/ panicked moments.  He states, 'that's everyday'.  I told him if that was the case he needed to change his daily controller medications.  He does not feel that his Cymbalta is helping him in any way and due to cost is asking to change this.  Will decrease from 90mg  to 60mg  and then switch to Prozac daily.  Continue his Lamictal at 100mg  BID as his rash has cleared w/ decreasing the dose.  If no improvement in his anxiety with these medication changes I will refer him back to psych for ongoing management.  Pt was not happy with the outcome of today's visit as he was hoping for scheduled Alprazolam 2-3x/day.  I told him in no uncertain terms that this would not come from me.  Will follow.

## 2015-08-30 NOTE — Assessment & Plan Note (Signed)
Chronic problem.  BP is controlled today since changing his medications at the last visit.  Will continue to follow.

## 2015-08-30 NOTE — Patient Instructions (Signed)
Follow up in 1 month to recheck anxiety Continue the Lamictal 100mg  twice daily Decrease the Cymbalta to 2 tabs daily x1 week and then [facility] to Fluoxetine once daily (you will stop the Cymbalta after 1 week of 2 tabs and start the Fluoxetine once daily) Continue the Buspar twice daily No other med changes at this time Use the Alprazolam as needed for panicked moments- this is not to be used regularly Call with any questions or concerns Hang in there!!

## 2015-08-30 NOTE — Progress Notes (Signed)
   Subjective:    Patient ID: Dakota Vang, male    DOB: Feb 15, 1966, 50 y.o.   MRN: [id number]  HPI Mood- 'that Xanax is the best thing to ever help me'.  'i am just stressed out.  I've never raised teenagers'.  Also on Cymbalta 90mg  daily and Buspar twice daily.  Currently in counseling w/ wife and son.  Pt is weaning off Lamictal- rash disappeared since decreasing Lamictal dose.  Pt doesn't feel that Buspar is helpful.  HTN- chronic problem.  At last visit we stopped Ramipril and Coreg and started Lisinopril.  Continued Amlodipine.  BP is stable today.  Pt has gained 10 lbs since last visit.  Denies CP, SOB, HAs, abd pain, N/V.   Review of Systems For ROS see HPI     Objective:   Physical Exam  Constitutional: He is oriented to person, place, and time. He appears well-developed and well-nourished. No distress.  Neurological: He is alert and oriented to person, place, and time.  Skin: Skin is warm and dry. No rash noted.  Psychiatric: His behavior is normal.  Flat affect- continually asking for Benzos today  Vitals reviewed.         Assessment & Plan:

## 2015-08-30 NOTE — Progress Notes (Signed)
Pre visit review using our clinic review tool, if applicable. No additional management support is needed unless otherwise documented below in the visit note. 

## 2015-09-09 ENCOUNTER — Other Ambulatory Visit (HOSPITAL_COMMUNITY): Payer: Self-pay | Admitting: Psychiatry

## 2015-09-13 ENCOUNTER — Telehealth (HOSPITAL_COMMUNITY): Payer: Self-pay

## 2015-09-13 ENCOUNTER — Telehealth: Payer: Self-pay | Admitting: General Practice

## 2015-09-13 NOTE — Telephone Encounter (Signed)
Since pt came to me requesting/demanding a medication that he was denied by his current psychiatric provider and made no mention of this fact, he will be dismissed due to lack of doctor/patient relationship.  He discussed his lamictal and the rash, did not feel his sxs were controlled on the Buspar but did not discuss that he was still seeing Dr Adele Schilder.  This lack of transparency does not foster the open relationship needed to provide the best care possible

## 2015-09-13 NOTE — Telephone Encounter (Signed)
Dismissal paperwork completed and forwarded to HIM.

## 2015-09-13 NOTE — Telephone Encounter (Signed)
Spoke with Regan with Behavioral health (Dr. Marguerite Olea office). She was calling in regards to the patient's prescriptions. They had received a refill request in regards to patient's Lamictal that Dr. Birdie Riddle had lowered and filled due to patient complaining of a rash. When Regan looked at patient's medications she seen where he had been prescribed alprazolam and advised that Dr. Adele Schilder has been refusing patient this medication for the last 5 years. Per Regan patient was seen on 08/21/15 with Dr. Adele Schilder and did not mention any problems, pt was then seen on 08/30/15 where he complained of major stress and that he needed his alprazolam 2-3 times a day.   Please advise?

## 2015-09-13 NOTE — Telephone Encounter (Signed)
Please come and see me about this patient. Medication management. Thank you

## 2015-09-14 ENCOUNTER — Other Ambulatory Visit: Payer: Self-pay | Admitting: Internal Medicine

## 2015-09-14 ENCOUNTER — Telehealth: Payer: Self-pay | Admitting: Family Medicine

## 2015-09-14 NOTE — Telephone Encounter (Signed)
Med filled #[phone] with 0 due to this being mail order.

## 2015-09-14 NOTE — Telephone Encounter (Signed)
Former dr hopper patient, routing request to patient's new pcp

## 2015-09-14 NOTE — Telephone Encounter (Signed)
Patient dismissed from [facility] by Annye Asa MD , effective September 13, 2015. Dismissal letter sent out by certified / registered mail.  DAJ

## 2015-09-14 NOTE — Telephone Encounter (Signed)
Ok to fill for 30 days as pt is being dismissed

## 2015-09-18 ENCOUNTER — Telehealth: Payer: Self-pay | Admitting: General Practice

## 2015-09-18 NOTE — Telephone Encounter (Signed)
Appts cancelled as requested

## 2015-09-18 NOTE — Telephone Encounter (Signed)
Please cancel the two upcoming appts for pt as he was dismissed from the practice on 09/13/15. This is per PCP the anxiety follow up is to be completed with [facility].

## 2015-09-20 ENCOUNTER — Telehealth (HOSPITAL_COMMUNITY): Payer: Self-pay

## 2015-09-20 ENCOUNTER — Encounter: Payer: Self-pay | Admitting: Family Medicine

## 2015-09-20 ENCOUNTER — Telehealth: Payer: Self-pay | Admitting: Family Medicine

## 2015-09-20 DIAGNOSIS — F988 Other specified behavioral and emotional disorders with onset usually occurring in childhood and adolescence: Secondary | ICD-10-CM

## 2015-09-20 MED ORDER — METHYLPHENIDATE HCL 20 MG PO TABS: ORAL_TABLET | ORAL | Status: DC

## 2015-09-20 MED ORDER — LAMOTRIGINE 150 MG PO TABS: 150.0000 mg | ORAL_TABLET | Freq: Two times a day (BID) | ORAL | Status: DC

## 2015-09-20 MED ORDER — DULOXETINE HCL 30 MG PO CPEP: 30.0000 mg | ORAL_CAPSULE | Freq: Three times a day (TID) | ORAL | Status: DC

## 2015-09-20 NOTE — Telephone Encounter (Signed)
Pt informed on mychart.

## 2015-09-20 NOTE — Telephone Encounter (Signed)
Pt states that he would like a call back explaining the dismissal letter he received. Pt states that he has not under minded Dr Birdie Riddle, he was trying to find a medication that would work for him.

## 2015-09-20 NOTE — Telephone Encounter (Signed)
Patient called today and was upset because he had received a dismissal letter from Dr. Birdie Riddle. He stated that he was not trying to do anything wrong and that he did not ask for all the changes that were made to his medications. Patient states that he wants to go back to his previous medications that Dr. Adele Schilder had him on as he felt more stable. Patient was supposed to follow up this month, but Dr. Adele Schilder is out until the end of the month and so patient was rescheduled to September. I spoke with Dr, Lovena Le about this case and he advised that we would refill the medications for one month as Dr. Adele Schilder has not discharged the patient, and I will discuss in more detail with Dr. Adele Schilder when he gets back. I made sure that the patient understood that he would need to make this appointment to talk to Dr. Adele Schilder, he is worried that the dr will be mad at him. I explained to the patient that the Dr. Is not mad, but that they would need to talk. Patient knows we will not be refilling the Xanax. His previous prescriptions were Cymbalta 90 mg a day, Lamictal 300 mg a day and Ritalin 20 mg TID - these were sent to the pharmacy for a 30 day supply per Dr. Lovena Le.

## 2015-09-20 NOTE — Telephone Encounter (Signed)
We received a call from pt's psychiatrist indicating that pt was told he was not to have alprazolam and has not been given to pt in 5 yrs.  Pt made no mention of this when discussing his treatment and also said he was no longer planning to see his psychiatrist- who he saw right before his appt with me.  He has a professional managing his psychiatric issues and b/c I have lost trust in our relationship, he will need to find a new doctor.

## 2015-09-27 ENCOUNTER — Ambulatory Visit (HOSPITAL_COMMUNITY): Payer: Self-pay | Admitting: Psychiatry

## 2015-09-29 ENCOUNTER — Other Ambulatory Visit (HOSPITAL_COMMUNITY): Payer: Self-pay | Admitting: Psychiatry

## 2015-10-02 ENCOUNTER — Telehealth (HOSPITAL_COMMUNITY): Payer: Self-pay

## 2015-10-02 NOTE — Telephone Encounter (Signed)
10/02/15 [id number] Pt's wife Dakota Vang [id number] came and pick-up rx script - informed the wife to let the pt know next time the patient has to call and give a heads-up tha  A family member is coming to pick-up rx.Marland KitchenMariana Kaufman

## 2015-10-04 ENCOUNTER — Ambulatory Visit: Payer: Self-pay | Admitting: Family Medicine

## 2015-10-06 NOTE — Telephone Encounter (Signed)
Certified dismissal letter returned as undeliverable, unclaimed, return to sender after three attempts by [employer]. Letter placed in another envelope and resent as 1st class mail which does not require a signature. °DAJ  °

## 2015-10-08 ENCOUNTER — Other Ambulatory Visit (HOSPITAL_COMMUNITY): Payer: Self-pay | Admitting: Psychiatry

## 2015-10-09 ENCOUNTER — Encounter: Payer: Self-pay | Admitting: Family Medicine

## 2015-10-14 ENCOUNTER — Other Ambulatory Visit: Payer: Self-pay | Admitting: Internal Medicine

## 2015-10-23 ENCOUNTER — Ambulatory Visit: Payer: Self-pay | Admitting: Family Medicine

## 2015-10-23 ENCOUNTER — Encounter: Payer: Self-pay | Admitting: Family Medicine

## 2015-10-23 DIAGNOSIS — I10 Essential (primary) hypertension: Secondary | ICD-10-CM | POA: Insufficient documentation

## 2015-10-23 DIAGNOSIS — K219 Gastro-esophageal reflux disease without esophagitis: Secondary | ICD-10-CM | POA: Insufficient documentation

## 2015-11-22 ENCOUNTER — Other Ambulatory Visit (HOSPITAL_COMMUNITY): Payer: Self-pay

## 2015-11-22 DIAGNOSIS — F988 Other specified behavioral and emotional disorders with onset usually occurring in childhood and adolescence: Secondary | ICD-10-CM

## 2015-11-22 MED ORDER — DULOXETINE HCL 30 MG PO CPEP: 30.0000 mg | ORAL_CAPSULE | Freq: Three times a day (TID) | ORAL | 0 refills | Status: DC

## 2015-11-22 MED ORDER — METHYLPHENIDATE HCL 20 MG PO TABS: ORAL_TABLET | ORAL | 0 refills | Status: DC

## 2015-11-22 MED ORDER — LAMOTRIGINE 150 MG PO TABS: 150.0000 mg | ORAL_TABLET | Freq: Two times a day (BID) | ORAL | 0 refills | Status: DC

## 2015-11-22 NOTE — Progress Notes (Signed)
Patient had to reschedule due to provider not being in office, Patient uses [facility] so a 90 day order was sent to them. The methylphenidate was printed for 30 days and Dr. Lovena Le approved.

## 2015-11-27 ENCOUNTER — Ambulatory Visit (HOSPITAL_COMMUNITY): Payer: Self-pay | Admitting: Psychiatry

## 2015-11-27 ENCOUNTER — Telehealth (HOSPITAL_COMMUNITY): Payer: Self-pay

## 2015-11-27 NOTE — Telephone Encounter (Signed)
Juvencio picked up his prescription on [id number]  lic [id number]  dlo

## 2015-12-18 ENCOUNTER — Other Ambulatory Visit: Payer: Self-pay | Admitting: Internal Medicine

## 2016-01-15 ENCOUNTER — Other Ambulatory Visit (HOSPITAL_COMMUNITY): Payer: Self-pay | Admitting: Psychiatry

## 2016-01-16 ENCOUNTER — Other Ambulatory Visit (HOSPITAL_COMMUNITY): Payer: Self-pay | Admitting: Psychiatry

## 2016-01-23 ENCOUNTER — Encounter (HOSPITAL_COMMUNITY): Payer: Self-pay | Admitting: Psychiatry

## 2016-01-23 ENCOUNTER — Ambulatory Visit (INDEPENDENT_AMBULATORY_CARE_PROVIDER_SITE_OTHER): Payer: Commercial Managed Care - HMO | Admitting: Psychiatry

## 2016-01-23 VITALS — BP 130/90 | HR 120 | Ht 73.0 in | Wt 236.4 lb

## 2016-01-23 DIAGNOSIS — Z79899 Other long term (current) drug therapy: Secondary | ICD-10-CM

## 2016-01-23 DIAGNOSIS — F900 Attention-deficit hyperactivity disorder, predominantly inattentive type: Secondary | ICD-10-CM

## 2016-01-23 DIAGNOSIS — F330 Major depressive disorder, recurrent, mild: Secondary | ICD-10-CM

## 2016-01-23 MED ORDER — LAMOTRIGINE 150 MG PO TABS: 150.0000 mg | ORAL_TABLET | Freq: Two times a day (BID) | ORAL | 0 refills | Status: DC

## 2016-01-23 MED ORDER — ESCITALOPRAM OXALATE 20 MG PO TABS: 20.0000 mg | ORAL_TABLET | Freq: Every day | ORAL | 1 refills | Status: DC

## 2016-01-23 MED ORDER — METHYLPHENIDATE HCL 20 MG PO TABS: ORAL_TABLET | ORAL | 0 refills | Status: DC

## 2016-01-23 MED ORDER — ALPRAZOLAM 0.5 MG PO TABS: 0.5000 mg | ORAL_TABLET | Freq: Every day | ORAL | 0 refills | Status: DC | PRN

## 2016-01-23 NOTE — Progress Notes (Signed)
[facility] Progress Note  BOYKIN COSPER [id number] 50 y.o.  01/23/2016 4:01 PM  Chief Complaint:  I don't think Cymbalta working very well.  I have a lot of irritability, depression and mood swings.  I'm not happy with my primary care physician, she dismissed me.  I have issues going on with my son   History of Present Illness:  Mustafa came for his followup appointment.  He is complaining of increased anxiety, stress, mood swing and depression.  He mentioned multiple stressors including feeling overwhelmed at work.  His son was arrested for breaking and spent night in the jail.  Finally he was seen by psychiatrist and not taking Depakote and his Strattera and doing better.  He is also not happy with his primary care physician who changed his blood pressure medication.  He also mentioned his primary care physician gave her Xanax, Prozac, BuSpar and cut down her Lamictal.  He is upset because he got dismissed from the practice and now he is looking for a new primary care physician.  Patient admitted that he is been more irritable, sensitive and like to try a different medication because Cymbalta not helping him.  He sleeping on and off.  He denies any paranoia, hallucination, suicidal thoughts or homicidal thought but admitted sometime feeling isolated, withdrawn, tired and have no energy.  He denies drinking or using any illegal substances.  He likes Xanax which she takes on occasion when he is very nervous anxious and having panic attacks.  His appetite is okay.  His vital signs are stable.  Suicidal Ideation: No Plan Formed: No Patient has means to carry out plan: No  Homicidal Ideation: No Plan Formed: No Patient has means to carry out plan: No  Review of Systems  Constitutional: Negative.   HENT: Negative.   Eyes: Negative.   Respiratory: Negative.   Cardiovascular: Negative.  Negative for palpitations.  Genitourinary: Negative.   Musculoskeletal: Negative.    Skin: Negative.  Negative for itching and rash.  Neurological: Negative for tremors.  Endo/Heme/Allergies: Negative.   Psychiatric/Behavioral: Negative for hallucinations, substance abuse and suicidal ideas.   Psychiatric: Agitation: No Hallucination: No Depressed Mood: Yes Insomnia: No Hypersomnia: Yes Altered Concentration: No Feels Worthless: No Grandiose Ideas: No Belief In Special Powers: No New/Increased Substance Abuse: No Compulsions: No  Neurologic: Headache: No Seizure: No Paresthesias: No  Medical History. Patient has history of diverticulitis, hyperlipidemia, hypertension, back pain and GERD.     Family and Social History: Patient endorse mother has depression and history of psychiatric inpatient treatment.  Patient is born and raised in [place].  He has a good childhood.  He lives with his wife and his 3 children.  He has good social network.  He is employed .  He denies any history of abuse in the past.  Alcohol and substance use history. Patient admitted history of DWI and drinking alcohol in the past however claims to be sober since he is taking Ritalin.  Outpatient Encounter Prescriptions as of 01/23/2016  Medication Sig Dispense Refill  . ALPRAZolam (XANAX) 0.5 MG tablet Take 1 tablet (0.5 mg total) by mouth daily as needed for anxiety. 30 tablet 0  . amLODipine (NORVASC) 5 MG tablet Take 1 tablet by mouth  daily 90 tablet 2  . fluticasone (FLONASE) 50 MCG/ACT nasal spray Place 2 sprays into both nostrils daily. 48 g 3  . lamoTRIgine (LAMICTAL) 150 MG tablet Take 1 tablet (150 mg total) by mouth 2 (two) times  daily. 180 tablet 0  . lisinopril (PRINIVIL,ZESTRIL) 30 MG tablet Take 1 tablet (30 mg total) by mouth daily. 30 tablet 6  . methylphenidate (RITALIN) 20 MG tablet Take one in AM, noon and afternoon 90 tablet 0  . omeprazole (PRILOSEC) 20 MG capsule Take 1 capsule by mouth  daily 90 capsule 0  . ramipril (ALTACE) 10 MG capsule Take 1 capsule by mouth   daily 90 capsule 0  . rosuvastatin (CRESTOR) 10 MG tablet Take 1 tablet (10 mg total) by mouth daily. 90 tablet 1  . [DISCONTINUED] ALPRAZolam (XANAX) 0.5 MG tablet Take 1 tablet (0.5 mg total) by mouth 2 (two) times daily as needed for anxiety. 30 tablet 1  . [DISCONTINUED] DULoxetine (CYMBALTA) 30 MG capsule Take 1 capsule (30 mg total) by mouth 3 (three) times daily. 270 capsule 0  . [DISCONTINUED] FLUoxetine (PROZAC) 40 MG capsule Take 1 capsule (40 mg total) by mouth daily. 90 capsule 3  . [DISCONTINUED] lamoTRIgine (LAMICTAL) 150 MG tablet Take 1 tablet (150 mg total) by mouth 2 (two) times daily. 180 tablet 0  . [DISCONTINUED] methylphenidate (RITALIN) 20 MG tablet Take one in AM, noon and afternoon 90 tablet 0  . [DISCONTINUED] methylphenidate (RITALIN) 20 MG tablet Take one in AM, noon and afternoon 90 tablet 0  . [DISCONTINUED] methylphenidate (RITALIN) 20 MG tablet Take one in AM, noon and afternoon 90 tablet 0  . aspirin EC 81 MG tablet Take 81 mg by mouth daily. Reported on 07/26/2015    . clindamycin (CLEOCIN T) 1 % external solution Apply topically 2 (two) times daily. (Patient not taking: Reported on 01/23/2016) 180 mL 1  . escitalopram (LEXAPRO) 20 MG tablet Take 1 tablet (20 mg total) by mouth daily. 30 tablet 1  . [DISCONTINUED] busPIRone (BUSPAR) 15 MG tablet Take 1/2 tablet by mouth twice daily for 2 weeks, then increase to 1 tablet by mouth twice daily. (Patient not taking: Reported on 01/23/2016) 60 tablet 1   No facility-administered encounter medications on file as of 01/23/2016.     Past Psychiatric History/Hospitalization(s): Anxiety: Yes Bipolar Disorder: No Depression: Patient has history of depression and mood swing for a long time.  He start taking psychotropic medication by his primary care physician Dr. Unice Cobble.  In the past he was given Prozac but limited response.  The past he had tried Wellbutrin, Cymbalta .  He was given BuSpar but he never took it.    Mania: No Psychosis: No Schizophrenia: No Personality Disorder: No Hospitalization for psychiatric illness: No History of Electroconvulsive Shock Therapy: No Prior Suicide Attempts: No  Physical Exam: Constitutional:  BP 130/90   Pulse (!) 120   Ht 6\' 1"  (1.854 m)   Wt 236 lb 6.4 oz (107.2 kg)   BMI 31.19 kg/m   General Appearance: alert, oriented, no acute distress and well nourished  No results found for this or any previous visit (from the past 2160 hour(s)). Musculoskeletal: Strength & Muscle Tone: within normal limits Gait & Station: normal Patient leans: N/A  Mental status examination: Patient is casually dressed and groomed.   He described his mood sad and irritable and his affect is constricted.  His his speech is slow, clear and coherent.  He maintained fair eye contact.  There were no flight of ideas or any loose association.  There were no delusions, paranoia or any obsessive thoughts.  He denies any active or passive suicidal thoughts or homicidal thought. His fund of knowledge is adequate.  His  attention and concentration is okay.  He has no tremors shakes or any EPS.  He is alert and oriented 3.  His insight judgment and impulse control is okay.  Established Problem, Stable/Improving (1), Review of Psycho-Social Stressors (1), Review and summation of old records (2), Established Problem, Worsening (2), New Problem, with no additional work-up planned (3), Review of Last Therapy Session (1), Review of Medication Regimen & Side Effects (2) and Review of New Medication or Change in Dosage (2)  Assessment: Axis I: Mood disorder NOS, attention deficit disorder. anxiety disorder NOS  Axis II: Deferred  Axis III:  Patient Active Problem List  Diagnosis  . Hyperlipidemia  . Attention deficit disorder  . Essential hypertension  . HEMORRHOIDS, INTERNAL  . G E R D  . COLONIC POLYPS, HX OF  . Diverticulitis of jejunum with microperforation s/p lap resection [id number]   . Pancolonic diverticulosis  . Diverticulitis of colon with perforation    Plan:  I reviewed collateral information from other providers.  Patient has been under a lot of stress due to his job situation and family situation.  He admitted having anxiety attacks.  Even though he was given BuSpar but he has been not taking it.  I recommended to discontinue Cymbalta since it is not working.  We will start Lexapro 20 mg but I recommended to take half tablet first 10 days.  I also recommended to cut down the Cymbalta 30 mg for 1 week and then stop.  We will continue Ritalin 20 mg 3 times a day and Lamictal 300 mg daily.  He has no rash, itching or any side effects from the medication.  Discussed benzodiazepine dependence, withdrawal and abuse.  I recommended not to take Xanax more than 1 a day to avoid any abuse.  Recommended to call us back if he has any question, concern if he feel worsening of the symptom.  Discuss safety plan that anytime having active suicidal thoughts or homicidal.  He need to call 911 or go to the local emergency room.  Follow-up in 6 weeks.    Tris Howell T., MD 01/23/2016

## 2016-01-27 ENCOUNTER — Other Ambulatory Visit: Payer: Self-pay | Admitting: Family Medicine

## 2016-02-20 ENCOUNTER — Other Ambulatory Visit (HOSPITAL_COMMUNITY): Payer: Self-pay

## 2016-02-20 DIAGNOSIS — F330 Major depressive disorder, recurrent, mild: Secondary | ICD-10-CM

## 2016-02-20 MED ORDER — ALPRAZOLAM 0.5 MG PO TABS: 0.5000 mg | ORAL_TABLET | Freq: Every day | ORAL | 0 refills | Status: DC | PRN

## 2016-03-14 ENCOUNTER — Other Ambulatory Visit (HOSPITAL_COMMUNITY): Payer: Self-pay | Admitting: Psychiatry

## 2016-03-14 DIAGNOSIS — F330 Major depressive disorder, recurrent, mild: Secondary | ICD-10-CM

## 2016-03-18 ENCOUNTER — Ambulatory Visit (INDEPENDENT_AMBULATORY_CARE_PROVIDER_SITE_OTHER): Payer: Commercial Managed Care - HMO | Admitting: Psychiatry

## 2016-03-18 ENCOUNTER — Encounter (HOSPITAL_COMMUNITY): Payer: Self-pay | Admitting: Psychiatry

## 2016-03-18 DIAGNOSIS — F900 Attention-deficit hyperactivity disorder, predominantly inattentive type: Secondary | ICD-10-CM

## 2016-03-18 DIAGNOSIS — F330 Major depressive disorder, recurrent, mild: Secondary | ICD-10-CM | POA: Diagnosis not present

## 2016-03-18 DIAGNOSIS — K219 Gastro-esophageal reflux disease without esophagitis: Secondary | ICD-10-CM | POA: Diagnosis not present

## 2016-03-18 DIAGNOSIS — Z801 Family history of malignant neoplasm of trachea, bronchus and lung: Secondary | ICD-10-CM | POA: Diagnosis not present

## 2016-03-18 DIAGNOSIS — F17210 Nicotine dependence, cigarettes, uncomplicated: Secondary | ICD-10-CM

## 2016-03-18 DIAGNOSIS — L709 Acne, unspecified: Secondary | ICD-10-CM | POA: Diagnosis not present

## 2016-03-18 DIAGNOSIS — Z800 Family history of malignant neoplasm of digestive organs: Secondary | ICD-10-CM | POA: Diagnosis not present

## 2016-03-18 DIAGNOSIS — Z79899 Other long term (current) drug therapy: Secondary | ICD-10-CM

## 2016-03-18 DIAGNOSIS — Z880 Allergy status to penicillin: Secondary | ICD-10-CM

## 2016-03-18 DIAGNOSIS — R7989 Other specified abnormal findings of blood chemistry: Secondary | ICD-10-CM | POA: Diagnosis not present

## 2016-03-18 DIAGNOSIS — Z823 Family history of stroke: Secondary | ICD-10-CM

## 2016-03-18 DIAGNOSIS — Z8249 Family history of ischemic heart disease and other diseases of the circulatory system: Secondary | ICD-10-CM

## 2016-03-18 DIAGNOSIS — Z8489 Family history of other specified conditions: Secondary | ICD-10-CM

## 2016-03-18 DIAGNOSIS — Z7982 Long term (current) use of aspirin: Secondary | ICD-10-CM

## 2016-03-18 MED ORDER — ESCITALOPRAM OXALATE 20 MG PO TABS: 20.0000 mg | ORAL_TABLET | Freq: Every day | ORAL | 0 refills | Status: DC

## 2016-03-18 MED ORDER — LAMOTRIGINE 150 MG PO TABS: 150.0000 mg | ORAL_TABLET | Freq: Two times a day (BID) | ORAL | 0 refills | Status: DC

## 2016-03-18 MED ORDER — METHYLPHENIDATE HCL 20 MG PO TABS: ORAL_TABLET | ORAL | 0 refills | Status: DC

## 2016-03-18 NOTE — Progress Notes (Signed)
[facility] MD/PA/NP OP Progress Note  03/18/2016 10:29 AM Dakota Vang  MRN:  [id number]  Chief Complaint:  Chief Complaint    Follow-up     Subjective:  I'm doing better with the medication.  I still unable to get primary care physician but at least my Xanax are filled by urgent care [facility].  HPI: Dakota Vang came for his follow-up appointment.  He is now taking Lexapro 20 mg any feeling much better.  He is no longer taking Prozac BuSpar and Cymbalta.  Though he struggles at home because of his son who does not takes his medication on time.  But overall patient feels much better in his anger, irritability, depression and mood swings.  He is taking Lamictal and Ritalin as prescribed.  He has no rash, itching or any side effects.  His blood pressure is a stable.  He denies any paranoia, hallucination, feeling hopelessness or worthlessness.  His energy level is fair.  His appetite is okay.  His vital signs are stable.  Visit Diagnosis:    ICD-9-CM ICD-10-CM   1. Attention deficit hyperactivity disorder (ADHD), predominantly inattentive type 314.00 F90.0 methylphenidate (RITALIN) 20 MG tablet     DISCONTINUED: methylphenidate (RITALIN) 20 MG tablet     DISCONTINUED: methylphenidate (RITALIN) 20 MG tablet  2. Major depressive disorder, recurrent episode, mild (HCC) 296.31 F33.0 escitalopram (LEXAPRO) 20 MG tablet     lamoTRIgine (LAMICTAL) 150 MG tablet    Past Psychiatric History: Reviewed.  Past Medical History:  Past Medical History:  Diagnosis Date  . ADD (attention deficit disorder with hyperactivity)    Dr. Donalee Citrin  . Anxiety    Dr. Donalee Citrin  . Colon polyps 2008   Dr Fuller Plan  . COLONIC POLYPS, HX OF 11/20/2007   Qualifier: Diagnosis of  By: Linna Darner MD, Gwyndolyn Saxon   Last colonoscopy negative 2013 PMH of plyps X 2; Dr Fuller Plan, GI    . Diverticulitis    PMH of  . Diverticulitis of jejunum with microperforation s/p lap resection [id number] 08/28/2012   Qualifier: Diagnosis of  By: Linna Darner MD,  Gwyndolyn Saxon   6/1-3/14 [facility] : ? Jejunal perforation F/U CT recommended 08/24/12   . Diverticulitis of sigmoid colon 06/24/2008   2005 by CT scan    . Diverticulosis 2002   Dr Velora Heckler  . Duodenal diverticulum 08/28/2012  . GERD (gastroesophageal reflux disease)   . Hyperlipidemia   . Hypertension   . Pancolonic diverticulosis 09/09/2012    Past Surgical History:  Procedure Laterality Date  . COLONOSCOPY  2013   Dr Fuller Plan  . COLONOSCOPY W/ POLYPECTOMY  2002 & 2008   Dr. Fuller Plan; polyps X 2  . LAPAROSCOPIC PARTIAL COLECTOMY N/A 09/24/2012   Procedure: laparoscopic assisted  resection of proximal jejunum containing the jejunal diverticulum;  Surgeon: [name], MD;  Location: [facility];  Service: General;  Laterality: N/A;  . LAPAROSCOPIC SMALL BOWEL RESECTION  09/24/2012   excision of proximal jejunum for jejunal diverticulitis  . LASIK     bilaterally  . TONSILLECTOMY      Family Psychiatric History: Reviewed.  Family History:  Family History  Problem Relation Age of Onset  . Colon cancer Father     in 57s  . Lung cancer Father     smoker; asbestos exposure  . Cancer Father     Mesothelioma  . Coronary artery disease Mother     CAGB X 2 & angio 3-4X  . Cancer Mother  Gallbladder  . Coronary artery disease Maternal Grandmother   . COPD Maternal Grandmother   . Heart attack Maternal Grandfather     late 60's  . Heart attack Paternal Grandfather 60  . Stroke Neg Hx   . Diabetes Paternal Uncle     Dialysis    Social History:  Social History   Social History  . Marital status: Married    Spouse name: N/A  . Number of children: N/A  . Years of education: N/A   Social History Main Topics  . Smoking status: Current Some Day Smoker    Types: Cigarettes  . Smokeless tobacco: Never Used     Comment: smoked 1988-present ,up to 1 ppd. 08/22/11 averaging 2-3 /day  . Alcohol use 3.6 - 4.8 oz/week    6 - 8 Cans of beer per week     Comment:  Beer on weekend   . Drug use: No  . Sexual activity: Yes   Other Topics Concern  . Not on file   Social History Narrative  . No narrative on file    Allergies:  Allergies  Allergen Reactions  . Penicillins Other (See Comments)    ? Reaction @ 51 yrs old    Metabolic Disorder Labs: Lab Results  Component Value Date   HGBA1C 5.7 08/26/2014   No results found for: PROLACTIN Lab Results  Component Value Date   CHOL 166 07/26/2015   TRIG 250.0 (H) 07/26/2015   HDL 30.80 (L) 07/26/2015   CHOLHDL 5 07/26/2015   VLDL 50.0 (H) 07/26/2015   LDLCALC 88 08/22/2014   LDLCALC 101 (H) 08/18/2013     Current Medications: Current Outpatient Prescriptions  Medication Sig Dispense Refill  . ALPRAZolam (XANAX) 0.5 MG tablet Take 1 tablet (0.5 mg total) by mouth daily as needed for anxiety. 30 tablet 0  . amLODipine (NORVASC) 5 MG tablet Take 1 tablet by mouth  daily 90 tablet 2  . aspirin EC 81 MG tablet Take 81 mg by mouth daily. Reported on 07/26/2015    . clindamycin (CLEOCIN T) 1 % external solution Apply topically 2 (two) times daily. (Patient not taking: Reported on 01/23/2016) 180 mL 1  . escitalopram (LEXAPRO) 20 MG tablet Take 1 tablet (20 mg total) by mouth daily. 90 tablet 0  . fluticasone (FLONASE) 50 MCG/ACT nasal spray Place 2 sprays into both nostrils daily. 48 g 3  . lamoTRIgine (LAMICTAL) 150 MG tablet Take 1 tablet (150 mg total) by mouth 2 (two) times daily. 180 tablet 0  . lisinopril (PRINIVIL,ZESTRIL) 30 MG tablet Take 1 tablet (30 mg total) by mouth daily. 30 tablet 6  . methylphenidate (RITALIN) 20 MG tablet Take one in AM, noon and afternoon 90 tablet 0  . omeprazole (PRILOSEC) 20 MG capsule Take 1 capsule by mouth  daily 90 capsule 0  . ramipril (ALTACE) 10 MG capsule Take 1 capsule by mouth  daily 90 capsule 0  . rosuvastatin (CRESTOR) 10 MG tablet Take 1 tablet (10 mg total) by mouth daily. 90 tablet 1   No current facility-administered medications for this  visit.     Neurologic: Headache: No Seizure: No Paresthesias: No  Musculoskeletal: Strength & Muscle Tone: within normal limits Gait & Station: normal Patient leans: N/A  Psychiatric Specialty Exam: ROS  Blood pressure 116/78, pulse 99, height 6\' 1"  (1.854 m), weight 234 lb (106.1 kg).Body mass index is 30.87 kg/m.  General Appearance: Casual  Eye Contact:  Good  Speech:  Slow  Volume:  Normal  Mood:  Euthymic  Affect:  Appropriate  Thought Process:  Coherent  Orientation:  Full (Time, Place, and Person)  Thought Content: WDL and Logical   Suicidal Thoughts:  No  Homicidal Thoughts:  No  Memory:  Immediate;   Good Recent;   Good Remote;   Good  Judgement:  Good  Insight:  Good  Psychomotor Activity:  Normal  Concentration:  Concentration: Good and Attention Span: Good  Recall:  Good  Fund of Knowledge: Good  Language: Good  Akathisia:  No  Handed:  Right  AIMS (if indicated):  None reported   Assets:  Communication Skills Desire for Improvement Financial Resources/Insurance Housing Physical Health Resilience  ADL's:  Intact  Cognition: WNL  Sleep:  Good      Assessment ; Dakota Vang is 51 year old Caucasian man who has diagnoses of depressive disorder, recurrent and attention deficit disorder came for his follow-up appointment.  Plan  Patient is doing better on Lexapro 20 mg daily.  He is no longer taking BuSpar, Prozac and Cymbalta.  I will continue Ritalin 20 mg 3 times a day, Lamictal 300 mg daily and Lexapro 20 mg daily.  He is getting Xanax from his urgent care however he like to get appointment to establish primary care physician.  I have provided name and contact information for [facility] physician and patient will try to get appointment there.  Discussed medication side effects and benefits.  Recommended to call us back if he has any question, concern if he feel worsening of the symptom.  Follow-up in 3 months.    Dustine Bertini T., MD 03/18/2016, 10:29 AM

## 2016-04-14 ENCOUNTER — Encounter (HOSPITAL_COMMUNITY): Payer: Self-pay | Admitting: Emergency Medicine

## 2016-04-14 ENCOUNTER — Emergency Department (HOSPITAL_COMMUNITY): Payer: Commercial Managed Care - HMO

## 2016-04-14 ENCOUNTER — Observation Stay (HOSPITAL_COMMUNITY)
Admission: EM | Admit: 2016-04-14 | Discharge: 2016-04-17 | Disposition: A | Discharge status: Home / Self Care | Payer: Commercial Managed Care - HMO | Attending: Internal Medicine | Admitting: Internal Medicine

## 2016-04-14 DIAGNOSIS — Z8249 Family history of ischemic heart disease and other diseases of the circulatory system: Secondary | ICD-10-CM | POA: Diagnosis not present

## 2016-04-14 DIAGNOSIS — I700 Atherosclerosis of aorta: Secondary | ICD-10-CM | POA: Diagnosis not present

## 2016-04-14 DIAGNOSIS — R197 Diarrhea, unspecified: Secondary | ICD-10-CM | POA: Diagnosis not present

## 2016-04-14 DIAGNOSIS — Z880 Allergy status to penicillin: Secondary | ICD-10-CM | POA: Diagnosis not present

## 2016-04-14 DIAGNOSIS — K219 Gastro-esophageal reflux disease without esophagitis: Secondary | ICD-10-CM | POA: Diagnosis not present

## 2016-04-14 DIAGNOSIS — I10 Essential (primary) hypertension: Secondary | ICD-10-CM | POA: Diagnosis not present

## 2016-04-14 DIAGNOSIS — Z801 Family history of malignant neoplasm of trachea, bronchus and lung: Secondary | ICD-10-CM | POA: Insufficient documentation

## 2016-04-14 DIAGNOSIS — K648 Other hemorrhoids: Secondary | ICD-10-CM | POA: Diagnosis not present

## 2016-04-14 DIAGNOSIS — K4020 Bilateral inguinal hernia, without obstruction or gangrene, not specified as recurrent: Secondary | ICD-10-CM | POA: Insufficient documentation

## 2016-04-14 DIAGNOSIS — R109 Unspecified abdominal pain: Secondary | ICD-10-CM | POA: Diagnosis not present

## 2016-04-14 DIAGNOSIS — F418 Other specified anxiety disorders: Secondary | ICD-10-CM | POA: Diagnosis not present

## 2016-04-14 DIAGNOSIS — Z7982 Long term (current) use of aspirin: Secondary | ICD-10-CM | POA: Insufficient documentation

## 2016-04-14 DIAGNOSIS — Z79899 Other long term (current) drug therapy: Secondary | ICD-10-CM | POA: Insufficient documentation

## 2016-04-14 DIAGNOSIS — Z86010 Personal history of colonic polyps: Secondary | ICD-10-CM | POA: Insufficient documentation

## 2016-04-14 DIAGNOSIS — F988 Other specified behavioral and emotional disorders with onset usually occurring in childhood and adolescence: Secondary | ICD-10-CM | POA: Diagnosis not present

## 2016-04-14 DIAGNOSIS — K5792 Diverticulitis of intestine, part unspecified, without perforation or abscess without bleeding: Secondary | ICD-10-CM | POA: Diagnosis not present

## 2016-04-14 DIAGNOSIS — F330 Major depressive disorder, recurrent, mild: Secondary | ICD-10-CM | POA: Insufficient documentation

## 2016-04-14 DIAGNOSIS — Z833 Family history of diabetes mellitus: Secondary | ICD-10-CM | POA: Insufficient documentation

## 2016-04-14 DIAGNOSIS — Z800 Family history of malignant neoplasm of digestive organs: Secondary | ICD-10-CM | POA: Diagnosis not present

## 2016-04-14 DIAGNOSIS — E860 Dehydration: Secondary | ICD-10-CM | POA: Insufficient documentation

## 2016-04-14 DIAGNOSIS — E785 Hyperlipidemia, unspecified: Secondary | ICD-10-CM | POA: Insufficient documentation

## 2016-04-14 DIAGNOSIS — E871 Hypo-osmolality and hyponatremia: Secondary | ICD-10-CM | POA: Insufficient documentation

## 2016-04-14 DIAGNOSIS — Z808 Family history of malignant neoplasm of other organs or systems: Secondary | ICD-10-CM | POA: Insufficient documentation

## 2016-04-14 DIAGNOSIS — K5732 Diverticulitis of large intestine without perforation or abscess without bleeding: Secondary | ICD-10-CM | POA: Diagnosis not present

## 2016-04-14 DIAGNOSIS — F17210 Nicotine dependence, cigarettes, uncomplicated: Secondary | ICD-10-CM | POA: Diagnosis not present

## 2016-04-14 DIAGNOSIS — R079 Chest pain, unspecified: Secondary | ICD-10-CM | POA: Diagnosis not present

## 2016-04-14 DIAGNOSIS — K760 Fatty (change of) liver, not elsewhere classified: Secondary | ICD-10-CM | POA: Diagnosis not present

## 2016-04-14 LAB — BASIC METABOLIC PANEL
Anion gap: 9 (ref 5–15)
BUN: 12 mg/dL (ref 6–20)
CALCIUM: 8.8 mg/dL — AB (ref 8.9–10.3)
CHLORIDE: 97 mmol/L — AB (ref 101–111)
CO2: 24 mmol/L (ref 22–32)
CREATININE: 1.03 mg/dL (ref 0.61–1.24)
Glucose, Bld: 109 mg/dL — ABNORMAL HIGH (ref 65–99)
Potassium: 3.8 mmol/L (ref 3.5–5.1)
SODIUM: 130 mmol/L — AB (ref 135–145)

## 2016-04-14 LAB — CBC
HCT: 44.1 % (ref 39.0–52.0)
Hemoglobin: 15.5 g/dL (ref 13.0–17.0)
MCH: 29.8 pg (ref 26.0–34.0)
MCHC: 35.1 g/dL (ref 30.0–36.0)
MCV: 84.8 fL (ref 78.0–100.0)
PLATELETS: 179 10*3/uL (ref 150–400)
RBC: 5.2 MIL/uL (ref 4.22–5.81)
RDW: 13.2 % (ref 11.5–15.5)
WBC: 17.2 10*3/uL — AB (ref 4.0–10.5)

## 2016-04-14 LAB — I-STAT TROPONIN, ED: TROPONIN I, POC: 0 ng/mL (ref 0.00–0.08)

## 2016-04-14 LAB — HEPATIC FUNCTION PANEL
ALBUMIN: 4.6 g/dL (ref 3.5–5.0)
ALK PHOS: 81 U/L (ref 38–126)
ALT: 32 U/L (ref 17–63)
AST: 20 U/L (ref 15–41)
BILIRUBIN INDIRECT: 0 mg/dL — AB (ref 0.3–0.9)
Bilirubin, Direct: 0.2 mg/dL (ref 0.1–0.5)
TOTAL PROTEIN: 7.1 g/dL (ref 6.5–8.1)
Total Bilirubin: 0.2 mg/dL — ABNORMAL LOW (ref 0.3–1.2)

## 2016-04-14 LAB — LIPASE, BLOOD: LIPASE: 28 U/L (ref 11–51)

## 2016-04-14 MED ORDER — ALPRAZOLAM 0.5 MG PO TABS: 1.0000 mg | ORAL_TABLET | Freq: Every day | ORAL | Status: DC | PRN

## 2016-04-14 MED ORDER — HYDROMORPHONE HCL 1 MG/ML IJ SOLN: 1.0000 mg | INTRAMUSCULAR | Status: DC | PRN

## 2016-04-14 MED ORDER — HYDROMORPHONE HCL 1 MG/ML IJ SOLN
0.5000 mg | Freq: Once | INTRAMUSCULAR | Status: AC
  Administered 2016-04-14: 0.5 mg via INTRAVENOUS
  Filled 2016-04-14: qty 1

## 2016-04-14 MED ORDER — PANTOPRAZOLE SODIUM 40 MG PO TBEC
40.0000 mg | DELAYED_RELEASE_TABLET | Freq: Every day | ORAL | Status: DC
  Administered 2016-04-15 – 2016-04-17 (×3): 40 mg via ORAL
  Filled 2016-04-14 (×3): qty 1

## 2016-04-14 MED ORDER — METRONIDAZOLE 500 MG PO TABS
500.0000 mg | ORAL_TABLET | Freq: Three times a day (TID) | ORAL | Status: DC
  Administered 2016-04-14 – 2016-04-17 (×9): 500 mg via ORAL
  Filled 2016-04-14 (×9): qty 1

## 2016-04-14 MED ORDER — ACETAMINOPHEN 325 MG PO TABS
650.0000 mg | ORAL_TABLET | Freq: Four times a day (QID) | ORAL | Status: DC | PRN
  Administered 2016-04-16: 650 mg via ORAL
  Filled 2016-04-14: qty 2

## 2016-04-14 MED ORDER — AMLODIPINE BESYLATE 5 MG PO TABS
5.0000 mg | ORAL_TABLET | Freq: Every day | ORAL | Status: DC
  Administered 2016-04-15 – 2016-04-17 (×3): 5 mg via ORAL
  Filled 2016-04-14 (×3): qty 1

## 2016-04-14 MED ORDER — ENOXAPARIN SODIUM 40 MG/0.4ML ~~LOC~~ SOLN
40.0000 mg | Freq: Every day | SUBCUTANEOUS | Status: DC
  Administered 2016-04-15 – 2016-04-16 (×3): 40 mg via SUBCUTANEOUS
  Filled 2016-04-14 (×3): qty 0.4

## 2016-04-14 MED ORDER — SODIUM CHLORIDE 0.9 % IV SOLN
INTRAVENOUS | Status: AC
  Administered 2016-04-15: 01:00:00 via INTRAVENOUS

## 2016-04-14 MED ORDER — MORPHINE SULFATE (PF) 4 MG/ML IV SOLN: 4.0000 mg | Freq: Once | INTRAVENOUS | Status: DC

## 2016-04-14 MED ORDER — ONDANSETRON HCL 4 MG PO TABS
4.0000 mg | ORAL_TABLET | Freq: Four times a day (QID) | ORAL | Status: DC | PRN
  Administered 2016-04-16 – 2016-04-17 (×2): 4 mg via ORAL
  Filled 2016-04-14 (×3): qty 1

## 2016-04-14 MED ORDER — ESCITALOPRAM OXALATE 10 MG PO TABS
20.0000 mg | ORAL_TABLET | Freq: Every day | ORAL | Status: DC
  Administered 2016-04-15 – 2016-04-17 (×3): 20 mg via ORAL
  Filled 2016-04-14 (×3): qty 2

## 2016-04-14 MED ORDER — FAMOTIDINE IN NACL 20-0.9 MG/50ML-% IV SOLN
20.0000 mg | Freq: Two times a day (BID) | INTRAVENOUS | Status: DC
  Administered 2016-04-15 (×2): 20 mg via INTRAVENOUS
  Filled 2016-04-14 (×2): qty 50

## 2016-04-14 MED ORDER — ONDANSETRON HCL 4 MG/2ML IJ SOLN: 4.0000 mg | Freq: Four times a day (QID) | INTRAMUSCULAR | Status: DC | PRN

## 2016-04-14 MED ORDER — IOPAMIDOL (ISOVUE-300) INJECTION 61%
INTRAVENOUS | Status: AC
  Administered 2016-04-14: 100 mL
  Filled 2016-04-14: qty 100

## 2016-04-14 MED ORDER — ONDANSETRON HCL 4 MG/2ML IJ SOLN
4.0000 mg | Freq: Once | INTRAMUSCULAR | Status: AC
  Administered 2016-04-14: 4 mg via INTRAVENOUS
  Filled 2016-04-14: qty 2

## 2016-04-14 MED ORDER — LAMOTRIGINE 25 MG PO TABS
150.0000 mg | ORAL_TABLET | Freq: Two times a day (BID) | ORAL | Status: DC
  Administered 2016-04-15 – 2016-04-17 (×5): 150 mg via ORAL
  Filled 2016-04-14 (×5): qty 2

## 2016-04-14 MED ORDER — HYDROMORPHONE HCL 1 MG/ML IJ SOLN
1.0000 mg | Freq: Once | INTRAMUSCULAR | Status: AC
  Administered 2016-04-14: 1 mg via INTRAVENOUS
  Filled 2016-04-14: qty 1

## 2016-04-14 MED ORDER — SODIUM CHLORIDE 0.9 % IV BOLUS (SEPSIS)
1000.0000 mL | Freq: Once | INTRAVENOUS | Status: AC
  Administered 2016-04-14: 1000 mL via INTRAVENOUS

## 2016-04-14 MED ORDER — FLUTICASONE PROPIONATE 50 MCG/ACT NA SUSP
2.0000 | Freq: Every day | NASAL | Status: DC | PRN
  Filled 2016-04-14: qty 16

## 2016-04-14 MED ORDER — ACETAMINOPHEN 650 MG RE SUPP: 650.0000 mg | Freq: Four times a day (QID) | RECTAL | Status: DC | PRN

## 2016-04-14 MED ORDER — CIPROFLOXACIN IN D5W 400 MG/200ML IV SOLN
400.0000 mg | Freq: Two times a day (BID) | INTRAVENOUS | Status: DC
  Administered 2016-04-15 – 2016-04-17 (×5): 400 mg via INTRAVENOUS
  Filled 2016-04-14 (×5): qty 200

## 2016-04-14 MED ORDER — CIPROFLOXACIN IN D5W 400 MG/200ML IV SOLN
400.0000 mg | Freq: Once | INTRAVENOUS | Status: AC
  Administered 2016-04-14: 400 mg via INTRAVENOUS
  Filled 2016-04-14: qty 200

## 2016-04-14 MED ORDER — ROSUVASTATIN CALCIUM 10 MG PO TABS
10.0000 mg | ORAL_TABLET | Freq: Every day | ORAL | Status: DC
Start: 2016-04-15 — End: 2016-04-17
  Administered 2016-04-15 – 2016-04-17 (×3): 10 mg via ORAL
  Filled 2016-04-14 (×3): qty 1

## 2016-04-14 MED ORDER — HYDROCODONE-ACETAMINOPHEN 5-325 MG PO TABS
1.0000 | ORAL_TABLET | ORAL | Status: DC | PRN
  Administered 2016-04-15 (×2): 2 via ORAL
  Filled 2016-04-14 (×2): qty 2

## 2016-04-14 NOTE — ED Triage Notes (Signed)
Pt c/o lower mid chest and upper abdominal pain intermittently  onset yesterday and is now consistent today. Pt reports diarrhea onset a couple days ago.

## 2016-04-14 NOTE — H&P (Signed)
History and Physical    Dakota Vang [id number] DOB: [birth year] DOA: 04/14/2016  PCP: No primary care provider on file.   Patient coming from: Home  Chief Complaint: Severe upper abdominal pain   HPI: Dakota Vang is a 51 y.o. male with medical history significant for diverticulosis with recurrent diverticulitis, GERD, hypertension, depression, and anxiety, now presenting to the emergency department for evaluation of severe upper abdominal pain. Patient reports that he had been in his usual state of health until developing severe pain in his upper abdomen, just left of center. Pain was described as severe, sharp, stabbing, localized to the left upper abdomen, initially intermittent but constant today, and with no alleviating or exacerbating factors identified. Patient reports that he had had diarrhea approximately 2 days ago without blood, but that has apparently resolved. He denies fevers or chills and denies vomiting. He has had recurrent diverticulitis and has required laparotomy in the past for perforation.   ED Course: Upon arrival to the ED, patient is found to be afebrile, saturating well on room air, tachycardic in the low 100s, and with vitals otherwise stable. EKG features sinus tachycardia with rate 106 and chest x-ray is negative for acute cardiopulmonary disease. Chemistry panels notable for sodium of 1:30 and chloride of 97. CBC features a leukocytosis to 17,200 and is otherwise normal. Troponin is undetectable. CT of the abdomen and pelvis was obtained and findings are consistent with acute diverticulitis involving the splenic flexure, without abscess or perforation. Patient was given 1 L of normal saline, Zofran, Dilaudid, and started on empiric ciprofloxacin and Flagyl. He remained hemodynamically stable in the ED and in no apparent respiratory distress and will be observed on the medical-surgical unit for ongoing evaluation and management of acute uncomplicated  diverticulitis.  Review of Systems:  All other systems reviewed and apart from HPI, are negative.  Past Medical History:  Diagnosis Date  . ADD (attention deficit disorder with hyperactivity)    Dr. Donalee Citrin  . Anxiety    Dr. Donalee Citrin  . Colon polyps 2008   Dr Fuller Plan  . COLONIC POLYPS, HX OF 11/20/2007   Qualifier: Diagnosis of  By: Linna Darner MD, Gwyndolyn Saxon   Last colonoscopy negative 2013 PMH of plyps X 2; Dr Fuller Plan, GI    . Diverticulitis    PMH of  . Diverticulitis of jejunum with microperforation s/p lap resection ID:[id number] 08/28/2012   Qualifier: Diagnosis of  By: Linna Darner MD, Gwyndolyn Saxon   6/1-3/14 [facility] ,[place] : ? Jejunal perforation F/U CT recommended 08/24/12   . Diverticulitis of sigmoid colon 06/24/2008   2005 by CT scan    . Diverticulosis 2002   Dr Velora Heckler  . Duodenal diverticulum 08/28/2012  . GERD (gastroesophageal reflux disease)   . Hyperlipidemia   . Hypertension   . Pancolonic diverticulosis 09/09/2012    Past Surgical History:  Procedure Laterality Date  . COLONOSCOPY  2013   Dr Fuller Plan  . COLONOSCOPY W/ POLYPECTOMY  2002 & 2008   Dr. Fuller Plan; polyps X 2  . LAPAROSCOPIC PARTIAL COLECTOMY N/A 09/24/2012   Procedure: laparoscopic assisted  resection of proximal jejunum containing the jejunal diverticulum;  Surgeon: [name], MD;  Location: [facility];  Service: General;  Laterality: N/A;  . LAPAROSCOPIC SMALL BOWEL RESECTION  09/24/2012   excision of proximal jejunum for jejunal diverticulitis  . LASIK     bilaterally  . TONSILLECTOMY       reports that he has been smoking Cigarettes.  He has  never used smokeless tobacco. He reports that he drinks about 3.6 - 4.8 oz of alcohol per week . He reports that he does not use drugs.  Allergies  Allergen Reactions  . Penicillins Other (See Comments)    Reaction:  Unknown  Has patient had a PCN reaction causing immediate rash, facial/tongue/throat swelling, SOB or lightheadedness with hypotension: Unsure Has  patient had a PCN reaction causing severe rash involving mucus membranes or skin necrosis: Unsure Has patient had a PCN reaction that required hospitalization Unsure Has patient had a PCN reaction occurring within the last 10 years: No If all of the above answers are "NO", then may proceed with Cephalosporin use.    Family History  Problem Relation Age of Onset  . Colon cancer Father     in 7s  . Lung cancer Father     smoker; asbestos exposure  . Cancer Father     Mesothelioma  . Coronary artery disease Mother     CAGB X 2 & angio 3-4X  . Cancer Mother     Gallbladder  . Coronary artery disease Maternal Grandmother   . COPD Maternal Grandmother   . Heart attack Maternal Grandfather     late 60's  . Heart attack Paternal Grandfather 50  . Diabetes Paternal Uncle     Dialysis  . Stroke Neg Hx      Prior to Admission medications   Medication Sig Start Date End Date Taking? Authorizing Provider  ALPRAZolam Duanne Moron) 1 MG tablet Take 1 mg by mouth daily as needed for anxiety.   Yes Historical Provider, MD  amLODipine (NORVASC) 5 MG tablet Take 5 mg by mouth daily.   Yes Historical Provider, MD  aspirin EC 81 MG tablet Take 81 mg by mouth daily.    Yes Historical Provider, MD  clindamycin (CLEOCIN T) 1 % external solution Apply 1 application topically as needed (for breakouts on neck).    Yes Historical Provider, MD  escitalopram (LEXAPRO) 20 MG tablet Take 1 tablet (20 mg total) by mouth daily. 03/18/16 03/18/17 Yes Kathlee Nations, MD  fluticasone (FLONASE) 50 MCG/ACT nasal spray Place 2 sprays into both nostrils daily as needed for rhinitis.   Yes Historical Provider, MD  lamoTRIgine (LAMICTAL) 150 MG tablet Take 1 tablet (150 mg total) by mouth 2 (two) times daily. 03/18/16  Yes Kathlee Nations, MD  lisinopril (PRINIVIL,ZESTRIL) 30 MG tablet Take 1 tablet (30 mg total) by mouth daily. 07/26/15  Yes Midge Minium, MD  methylphenidate (RITALIN) 10 MG tablet Take 10 mg by mouth 3 (three)  times daily.   Yes Historical Provider, MD  omeprazole (PRILOSEC) 20 MG capsule Take 20 mg by mouth daily.   Yes Historical Provider, MD  ramipril (ALTACE) 10 MG capsule Take 10 mg by mouth daily.   Yes Historical Provider, MD  rosuvastatin (CRESTOR) 10 MG tablet Take 1 tablet (10 mg total) by mouth daily. 07/27/15  Yes Midge Minium, MD    Physical Exam: Vitals:   04/14/16 2121 04/14/16 2130 04/14/16 2230  BP: 136/85 130/97 137/89  Pulse: 98 106 105  Resp: 25 20   Temp: 98.1 F (36.7 C)    TempSrc: Oral    SpO2: 98% 97% 96%      Constitutional: No acute respiratory distress, calm, appears uncomfortable Eyes: PERTLA, lids and conjunctivae normal ENMT: Mucous membranes are moist. Posterior pharynx clear of any exudate or lesions.   Neck: normal, supple, no masses, no thyromegaly Respiratory: clear to  auscultation bilaterally, no wheezing, no crackles. Normal respiratory effort.   Cardiovascular: Rate ~110 and regular. No extremity edema. No significant JVD. Abdomen: No distension, exquisitely tender in upper abdomen, particularly on left side, no guarding or rebound pain, no masses palpated. Bowel sounds normal.  Musculoskeletal: no clubbing / cyanosis. No joint deformity upper and lower extremities. Normal muscle tone.  Skin: no significant rashes, lesions, ulcers. Warm, dry, well-perfused. Neurologic: CN 2-12 grossly intact. Sensation intact, DTR normal. Strength 5/5 in all 4 limbs.  Psychiatric: Normal judgment and insight. Alert and oriented x 3. Normal mood and affect.     Labs on Admission: I have personally reviewed following labs and imaging studies  CBC:  Recent Labs Lab 04/14/16 2134  WBC 17.2*  HGB 15.5  HCT 44.1  MCV 84.8  PLT 0000000   Basic Metabolic Panel:  Recent Labs Lab 04/14/16 2134  NA 130*  K 3.8  CL 97*  CO2 24  GLUCOSE 109*  BUN 12  CREATININE 1.03  CALCIUM 8.8*   GFR: CrCl cannot be calculated (Unknown ideal weight.). Liver  Function Tests:  Recent Labs Lab 04/14/16 2134  AST 20  ALT 32  ALKPHOS 81  BILITOT 0.2*  PROT 7.1  ALBUMIN 4.6    Recent Labs Lab 04/14/16 2134  LIPASE 28   No results for input(s): AMMONIA in the last 168 hours. Coagulation Profile: No results for input(s): INR, PROTIME in the last 168 hours. Cardiac Enzymes: No results for input(s): CKTOTAL, CKMB, CKMBINDEX, TROPONINI in the last 168 hours. BNP (last 3 results) No results for input(s): PROBNP in the last 8760 hours. HbA1C: No results for input(s): HGBA1C in the last 72 hours. CBG: No results for input(s): GLUCAP in the last 168 hours. Lipid Profile: No results for input(s): CHOL, HDL, LDLCALC, TRIG, CHOLHDL, LDLDIRECT in the last 72 hours. Thyroid Function Tests: No results for input(s): TSH, T4TOTAL, FREET4, T3FREE, THYROIDAB in the last 72 hours. Anemia Panel: No results for input(s): VITAMINB12, FOLATE, FERRITIN, TIBC, IRON, RETICCTPCT in the last 72 hours. Urine analysis:    Component Value Date/Time   COLORURINE YELLOW 03/29/2013 1337   APPEARANCEUR CLEAR 03/29/2013 1337   LABSPEC 1.021 03/29/2013 1337   PHURINE 5.5 03/29/2013 1337   GLUCOSEU NEGATIVE 03/29/2013 1337   GLUCOSEU NEG mg/dL 11/20/2007 0000   HGBUR NEGATIVE 03/29/2013 1337   HGBUR negative 06/24/2008 1530   BILIRUBINUR NEGATIVE 03/29/2013 1337   BILIRUBINUR Neg 08/21/2011 1439   KETONESUR NEGATIVE 03/29/2013 1337   PROTEINUR NEGATIVE 03/29/2013 1337   UROBILINOGEN 0.2 03/29/2013 1337   NITRITE NEGATIVE 03/29/2013 1337   LEUKOCYTESUR NEGATIVE 03/29/2013 1337   Sepsis Labs: @LABRCNTIP (procalcitonin:4,lacticidven:4) )No results found for this or any previous visit (from the past 240 hour(s)).   Radiological Exams on Admission: Dg Chest 2 View  Result Date: 04/14/2016 CLINICAL DATA:  Lower mid chest and upper abdominal pain EXAM: CHEST  2 VIEW COMPARISON:  09/21/2012 FINDINGS: The heart size and mediastinal contours are within normal  limits. Both lungs are clear. The visualized skeletal structures are unremarkable. IMPRESSION: No active cardiopulmonary disease. Electronically Signed   By: Donavan Foil M.D.   On: 04/14/2016 22:09   Ct Abdomen Pelvis W Contrast  Result Date: 04/14/2016 CLINICAL DATA:  c/o lower mid chest and LUQ upper abdominal pain intermittently onset yesterday and is now consistent today. Pt reports diarrhea onset a couple days ago 100 ml isovue 300^1 ISOVUE-300 IOPAMIDOL (ISOVUE-300) INJECTION 61% EXAM: CT ABDOMEN AND PELVIS WITH CONTRAST TECHNIQUE: Multidetector CT  imaging of the abdomen and pelvis was performed using the standard protocol following bolus administration of intravenous contrast. CONTRAST:  1 ISOVUE-300 IOPAMIDOL (ISOVUE-300) INJECTION 61% COMPARISON:  04/02/2013 FINDINGS: Lower chest: No acute abnormality. Hepatobiliary: There is diffuse low-attenuation of the liver. No focal liver lesions are identified. Gallbladder is present and normal in CT appearance. Pancreas: Unremarkable. No pancreatic ductal dilatation or surrounding inflammatory changes. Spleen: Normal in size without focal abnormality. Adrenals/Urinary Tract: The adrenal glands are normal in appearance. No hydronephrosis. Stomach/Bowel: The stomach has a normal appearance. Small bowel loops are normal in appearance. Unremarkable appearance of a small bowel to small bowel anastomosis in the mid abdomen. The appendix is well seen and has a normal appearance. There are extensive diverticula throughout the colon. There is inflammatory change in the fat surrounding the splenic flexure in this segment with numerous diverticula. The findings are consistent with acute diverticulitis. No associated perforation or abscess. Vascular/Lymphatic: There is atherosclerotic calcification of the abdominal aorta. No aneurysm. No retroperitoneal or mesenteric adenopathy. Reproductive: Uterus and bilateral adnexa are unremarkable. Other: Fat containing bilateral  inguinal hernias.  No ascites. Musculoskeletal: No acute or significant osseous findings. IMPRESSION: 1. Acute diverticulitis involving the splenic flexure of the colon. No associated abscess or perforation. 2. Hepatic steatosis. Electronically Signed   By: Nolon Nations M.D.   On: 04/14/2016 22:37    EKG: Independently reviewed. Sinus tachycardia (rate 106)   Assessment/Plan  1. Acute diverticulitis  - Pt with known pancolonic diverticulosis and recurrent diverticulitis, once requiring laporotomy for perforation  - Now presents with severe abd pain that was intermittent yesterday, becoming constant today  - WBC elevated to 17,200 without fever - CT findings consistent with acute diverticulitis at splenic flexure without abscess or perforation  - Pain not adequately controlled in ED despite multiple doses of parenteral narcotic  - He will be continued on empiric Cipro and Flagyl, IVF hydration, prn analgesics and antiemetics, clear liquids as tolerated   2. Hypertension  - BP at goal on admission  - Continue Norvasc; hold ACE for now given acute illness with dehydration    3. GERD - EGD from September 2008 with normal esophagus, mild gastritis  - Managed with daily PPI at home, will continue    4. Depression, anxiety  - Appears to be stable on admission  - Continue Lexapro, Lamictal, and prn Xanax  5. Hyponatremia - Serum sodium 130 on admission, previously normal  - Likely secondary to dehydration; he is being hydrated with normal saline  - Repeat chem panel in am    DVT prophylaxis: sq Lovenox  Code Status: Full  Family Communication: Wife updated at bedside Disposition Plan: Observe on med-surg Consults called: None Admission status: Observation    Vianne Bulls, MD [facility] Pager [phone]  If 7PM-7AM, please contact night-coverage [email or web address] Password [email or web address]  04/14/2016, 11:25 PM

## 2016-04-14 NOTE — ED Provider Notes (Signed)
Emergency Department Provider Note   I have reviewed the triage vital signs and the nursing notes.   HISTORY  Chief Complaint Chest Pain   HPI Dakota Vang is a 51 y.o. male with PMH of diverticulitis, GERD, HTN, and HLD presents to the ED with intermittent abdominal and chest pain for the last 24 hours that has become more constant today. He reports a PMH of diverticulitis but that this pain is typically lower and his last flare was 3 years prior and seemed to improve after surgery. His pain is currently mostly in his abdomen and slightly left. He describes it as twisting and sharp, severe, non-radiating. No alleviating factors. Worse with palpation.    Past Medical History:  Diagnosis Date  . ADD (attention deficit disorder with hyperactivity)    Dr. Donalee Citrin  . Anxiety    Dr. Donalee Citrin  . Colon polyps 2008   Dr Fuller Plan  . COLONIC POLYPS, HX OF 11/20/2007   Qualifier: Diagnosis of  By: Linna Darner MD, Gwyndolyn Saxon   Last colonoscopy negative 2013 PMH of plyps X 2; Dr Fuller Plan, GI    . Diverticulitis    PMH of  . Diverticulitis of jejunum with microperforation s/p lap resection [id number] 08/28/2012   Qualifier: Diagnosis of  By: Linna Darner MD, Gwyndolyn Saxon   6/1-3/14 [facility] : ? Jejunal perforation F/U CT recommended 08/24/12   . Diverticulitis of sigmoid colon 06/24/2008   2005 by CT scan    . Diverticulosis 2002   Dr Velora Heckler  . Duodenal diverticulum 08/28/2012  . GERD (gastroesophageal reflux disease)   . Hyperlipidemia   . Hypertension   . Pancolonic diverticulosis 09/09/2012    Patient Active Problem List   Diagnosis Date Noted  . Acute diverticulitis 04/14/2016  . Depression with anxiety 04/14/2016  . Diverticulitis 04/14/2016  . Diverticulitis of colon with perforation 03/29/2013  . Pancolonic diverticulosis 09/09/2012  . Diverticulitis of jejunum with microperforation s/p lap resection [id number] 08/28/2012  . Attention deficit disorder 11/20/2007  . COLONIC  POLYPS, HX OF 11/20/2007  . Hyperlipidemia 06/12/2007  . Essential hypertension 06/12/2007  . HEMORRHOIDS, INTERNAL 11/27/2006  . G E R D 09/19/2006    Past Surgical History:  Procedure Laterality Date  . COLON SURGERY    . COLONOSCOPY  2013   Dr Fuller Plan  . COLONOSCOPY W/ POLYPECTOMY  2002 & 2008   Dr. Fuller Plan; polyps X 2  . LAPAROSCOPIC PARTIAL COLECTOMY N/A 09/24/2012   Procedure: laparoscopic assisted  resection of proximal jejunum containing the jejunal diverticulum;  Surgeon: [name], MD;  Location: [facility];  Service: General;  Laterality: N/A;  . LAPAROSCOPIC SMALL BOWEL RESECTION  09/24/2012   excision of proximal jejunum for jejunal diverticulitis  . LASIK     bilaterally  . TONSILLECTOMY        Allergies Penicillins  Family History  Problem Relation Age of Onset  . Colon cancer Father     in 56s  . Lung cancer Father     smoker; asbestos exposure  . Cancer Father     Mesothelioma  . Coronary artery disease Mother     CAGB X 2 & angio 3-4X  . Cancer Mother     Gallbladder  . Coronary artery disease Maternal Grandmother   . COPD Maternal Grandmother   . Heart attack Maternal Grandfather     late 60's  . Heart attack Paternal Grandfather 18  . Diabetes Paternal Uncle     Dialysis  . Stroke  Neg Hx     Social History Social History  Substance Use Topics  . Smoking status: Current Some Day Smoker    Types: Cigarettes  . Smokeless tobacco: Never Used     Comment: smoked 1988-present ,up to 1 ppd. 08/22/11 averaging 2-3 /day  . Alcohol use 3.6 - 4.8 oz/week    6 - 8 Cans of beer per week     Comment: Beer on weekend     Review of Systems  Constitutional: No fever/chills Eyes: No visual changes. ENT: No sore throat. Cardiovascular: Positive chest pain. Respiratory: Denies shortness of breath. Gastrointestinal: Positive LUQ abdominal pain.  No nausea, no vomiting.  No diarrhea.  No constipation. Genitourinary: Negative for  dysuria. Musculoskeletal: Negative for back pain. Skin: Negative for rash. Neurological: Negative for headaches, focal weakness or numbness.  10-point ROS otherwise negative.  ____________________________________________   PHYSICAL EXAM:  VITAL SIGNS: ED Triage Vitals  Enc Vitals Group     BP 04/14/16 2121 136/85     Pulse Rate 04/14/16 2121 98     Resp 04/14/16 2121 25     Temp 04/14/16 2121 98.1 F (36.7 C)     Temp Source 04/14/16 2121 Oral     SpO2 04/14/16 2121 98 %     Pain Score 04/14/16 2120 10   Constitutional: Alert and oriented. Well appearing and in no acute distress. Eyes: Conjunctivae are normal. Head: Atraumatic. Nose: No congestion/rhinnorhea. Mouth/Throat: Mucous membranes are dry.  Oropharynx non-erythematous. Neck: No stridor.  Cardiovascular: Normal rate, regular rhythm. Good peripheral circulation. Grossly normal heart sounds.   Respiratory: Normal respiratory effort.  No retractions. Lungs CTAB. Gastrointestinal: Soft with focal tenderness in the LUQ with guarding. No rebound. No distention.  Musculoskeletal: No lower extremity tenderness nor edema. No gross deformities of extremities. Neurologic:  Normal speech and language. No gross focal neurologic deficits are appreciated.  Skin:  Skin is warm, dry and intact. No rash noted. Psychiatric: Mood and affect are normal. Speech and behavior are normal.  ____________________________________________   LABS (all labs ordered are listed, but only abnormal results are displayed)  Labs Reviewed  BASIC METABOLIC PANEL - Abnormal; Notable for the following:       Result Value   Sodium 130 (*)    Chloride 97 (*)    Glucose, Bld 109 (*)    Calcium 8.8 (*)    All other components within normal limits  CBC - Abnormal; Notable for the following:    WBC 17.2 (*)    All other components within normal limits  HEPATIC FUNCTION PANEL - Abnormal; Notable for the following:    Total Bilirubin 0.2 (*)     Indirect Bilirubin 0.0 (*)    All other components within normal limits  BASIC METABOLIC PANEL - Abnormal; Notable for the following:    Glucose, Bld 106 (*)    Calcium 8.7 (*)    All other components within normal limits  CBC - Abnormal; Notable for the following:    WBC 14.2 (*)    All other components within normal limits  LIPASE, BLOOD  I-STAT TROPOININ, ED   ____________________________________________  EKG   EKG Interpretation  Date/Time:  Sunday April 14 2016 21:30:43 EST Ventricular Rate:  104 PR Interval:    QRS Duration: 77 QT Interval:  301 QTC Calculation: 396 R Axis:   80 Text Interpretation:  Sinus tachycardia No STEMI.  Confirmed by Gyasi Hazzard MD, Meldon Hanzlik [phone]) on 04/14/2016 9:56:38 PM  ____________________________________________  [id number]  Dg Chest 2 View  Result Date: 04/14/2016 CLINICAL DATA:  Lower mid chest and upper abdominal pain EXAM: CHEST  2 VIEW COMPARISON:  09/21/2012 FINDINGS: The heart size and mediastinal contours are within normal limits. Both lungs are clear. The visualized skeletal structures are unremarkable. IMPRESSION: No active cardiopulmonary disease. Electronically Signed   By: Donavan Foil M.D.   On: 04/14/2016 22:09   Ct Abdomen Pelvis W Contrast  Result Date: 04/14/2016 CLINICAL DATA:  c/o lower mid chest and LUQ upper abdominal pain intermittently onset yesterday and is now consistent today. Pt reports diarrhea onset a couple days ago 100 ml isovue 300^1 ISOVUE-300 IOPAMIDOL (ISOVUE-300) INJECTION 61% EXAM: CT ABDOMEN AND PELVIS WITH CONTRAST TECHNIQUE: Multidetector CT imaging of the abdomen and pelvis was performed using the standard protocol following bolus administration of intravenous contrast. CONTRAST:  1 ISOVUE-300 IOPAMIDOL (ISOVUE-300) INJECTION 61% COMPARISON:  04/02/2013 FINDINGS: Lower chest: No acute abnormality. Hepatobiliary: There is diffuse low-attenuation of the liver. No focal liver lesions are identified.  Gallbladder is present and normal in CT appearance. Pancreas: Unremarkable. No pancreatic ductal dilatation or surrounding inflammatory changes. Spleen: Normal in size without focal abnormality. Adrenals/Urinary Tract: The adrenal glands are normal in appearance. No hydronephrosis. Stomach/Bowel: The stomach has a normal appearance. Small bowel loops are normal in appearance. Unremarkable appearance of a small bowel to small bowel anastomosis in the mid abdomen. The appendix is well seen and has a normal appearance. There are extensive diverticula throughout the colon. There is inflammatory change in the fat surrounding the splenic flexure in this segment with numerous diverticula. The findings are consistent with acute diverticulitis. No associated perforation or abscess. Vascular/Lymphatic: There is atherosclerotic calcification of the abdominal aorta. No aneurysm. No retroperitoneal or mesenteric adenopathy. Reproductive: Uterus and bilateral adnexa are unremarkable. Other: Fat containing bilateral inguinal hernias.  No ascites. Musculoskeletal: No acute or significant osseous findings. IMPRESSION: 1. Acute diverticulitis involving the splenic flexure of the colon. No associated abscess or perforation. 2. Hepatic steatosis. Electronically Signed   By: Nolon Nations M.D.   On: 04/14/2016 22:37    ____________________________________________   PROCEDURES  Procedure(s) performed:   Procedures  None ____________________________________________   INITIAL IMPRESSION / ASSESSMENT AND PLAN / ED COURSE  Pertinent labs & imaging results that were available during my care of the patient were reviewed by me and considered in my medical decision making (see chart for details).  Patient presents to the ED for evaluation of abdominal and chest pain. Pain is atypical for ACS or PE. Patient with point tenderness over the LUQ of the abdomen. Normal chest and lung exam. Plan for troponin, CXR, and CT  abdomen/pelvis with history of diverticulitis.   10:40 PM Patient with acute diverticulitis on CT. Asking for additional pain medication which was given. Added Cipro and Flagyl. Patient has required admission for pain control in all past instances of diverticulitis and feels he needs this again. Will page for admission.   Discussed patient's case with hospitalist, Dr. Myna Hidalgo.  Recommend admission to med-surg, obs bed.  I will place holding orders per their request. Patient and family (if present) updated with plan. Care transferred to hospitalist service.  I reviewed all nursing notes, vitals, pertinent old records, EKGs, labs, imaging (as available).  ____________________________________________  FINAL CLINICAL IMPRESSION(S) / ED DIAGNOSES  Final diagnoses:  Diverticulitis of large intestine without perforation or abscess without bleeding     MEDICATIONS GIVEN DURING THIS VISIT:  Medications  metroNIDAZOLE (FLAGYL) tablet  500 mg (500 mg Oral Given 04/15/16 0455)  ALPRAZolam (XANAX) tablet 1 mg (not administered)  amLODipine (NORVASC) tablet 5 mg (5 mg Oral Given 04/15/16 0950)  fluticasone (FLONASE) 50 MCG/ACT nasal spray 2 spray (not administered)  pantoprazole (PROTONIX) EC tablet 40 mg (40 mg Oral Given 04/15/16 0951)  escitalopram (LEXAPRO) tablet 20 mg (20 mg Oral Given 04/15/16 1004)  lamoTRIgine (LAMICTAL) tablet 150 mg (150 mg Oral Given 04/15/16 0951)  rosuvastatin (CRESTOR) tablet 10 mg (10 mg Oral Given 04/15/16 0950)  enoxaparin (LOVENOX) injection 40 mg (40 mg Subcutaneous Given 04/15/16 0100)  0.9 %  sodium chloride infusion ( Intravenous New Bag/Given 04/15/16 0031)  acetaminophen (TYLENOL) tablet 650 mg (not administered)    Or  acetaminophen (TYLENOL) suppository 650 mg (not administered)  ondansetron (ZOFRAN) tablet 4 mg (not administered)    Or  ondansetron (ZOFRAN) injection 4 mg (not administered)  famotidine (PEPCID) IVPB 20 mg premix (20 mg Intravenous Given 04/15/16 1005)   ciprofloxacin (CIPRO) IVPB 400 mg (400 mg Intravenous Given 04/15/16 0953)  HYDROmorphone (DILAUDID) injection 1 mg (1 mg Intravenous Given 04/15/16 [id number])  Influenza vac split quadrivalent PF (FLUARIX) injection 0.5 mL (not administered)  oxyCODONE-acetaminophen (PERCOCET/ROXICET) 5-325 MG per tablet 1-2 tablet (2 tablets Oral Given 04/15/16 1004)  sodium chloride 0.9 % bolus 1,000 mL (0 mLs Intravenous Stopped 04/14/16 2301)  ondansetron (ZOFRAN) injection 4 mg (4 mg Intravenous Given 04/14/16 2200)  HYDROmorphone (DILAUDID) injection 0.5 mg (0.5 mg Intravenous Given 04/14/16 2200)  iopamidol (ISOVUE-300) 61 % injection (100 mLs  Contrast Given 04/14/16 2208)  HYDROmorphone (DILAUDID) injection 1 mg (1 mg Intravenous Given 04/14/16 2242)  ciprofloxacin (CIPRO) IVPB 400 mg (400 mg Intravenous New Bag/Given 04/14/16 2259)  HYDROmorphone (DILAUDID) injection 1 mg (1 mg Intravenous Given 04/15/16 0245)     NEW OUTPATIENT MEDICATIONS STARTED DURING THIS VISIT:  None   Note:  This document was prepared using Dragon voice recognition software and may include unintentional dictation errors.  Nanda Quinton, MD Emergency Medicine  Margette Fast, MD 04/15/16 [phone]

## 2016-04-14 NOTE — Progress Notes (Signed)
Pharmacy Antibiotic Note  NEVIN WILLENBORG is a 51 y.o. male admitted on 04/14/2016 with Intra-abdominal infection.  Pharmacy has been consulted for Ciprofloxacin dosing.  Plan: Ciprofloxacin 400mg  iv q12hr  Weight: 233 lb 11 oz (106 kg)  Temp (24hrs), Avg:98.1 F (36.7 C), Min:98.1 F (36.7 C), Max:98.1 F (36.7 C)   Recent Labs Lab 04/14/16 2134  WBC 17.2*  CREATININE 1.03    Estimated Creatinine Clearance: 109.6 mL/min (by C-G formula based on SCr of 1.03 mg/dL).    Allergies  Allergen Reactions  . Penicillins Other (See Comments)    Reaction:  Unknown  Has patient had a PCN reaction causing immediate rash, facial/tongue/throat swelling, SOB or lightheadedness with hypotension: Unsure Has patient had a PCN reaction causing severe rash involving mucus membranes or skin necrosis: Unsure Has patient had a PCN reaction that required hospitalization Unsure Has patient had a PCN reaction occurring within the last 10 years: No If all of the above answers are "NO", then may proceed with Cephalosporin use.    Antimicrobials this admission: Ciprofloxacin 2/4 >> Flagyl 2/4 >>  Dose adjustments this admission: -  Microbiology results: pending  Thank you for allowing pharmacy to be a part of this patient's care.  Nani Skillern Crowford 04/14/2016 11:53 PM

## 2016-04-15 ENCOUNTER — Encounter (HOSPITAL_COMMUNITY): Payer: Self-pay | Admitting: *Deleted

## 2016-04-15 DIAGNOSIS — K5792 Diverticulitis of intestine, part unspecified, without perforation or abscess without bleeding: Secondary | ICD-10-CM | POA: Diagnosis not present

## 2016-04-15 LAB — BASIC METABOLIC PANEL
ANION GAP: 8 (ref 5–15)
BUN: 13 mg/dL (ref 6–20)
CHLORIDE: 102 mmol/L (ref 101–111)
CO2: 28 mmol/L (ref 22–32)
CREATININE: 1.06 mg/dL (ref 0.61–1.24)
Calcium: 8.7 mg/dL — ABNORMAL LOW (ref 8.9–10.3)
GFR calc non Af Amer: >60 mL/min (ref >60)
Glucose, Bld: 106 mg/dL — ABNORMAL HIGH (ref 65–99)
Potassium: 4.3 mmol/L (ref 3.5–5.1)
SODIUM: 138 mmol/L (ref 135–145)

## 2016-04-15 LAB — CBC
HCT: 41.2 % (ref 39.0–52.0)
Hemoglobin: 13.9 g/dL (ref 13.0–17.0)
MCH: 29.1 pg (ref 26.0–34.0)
MCHC: 33.7 g/dL (ref 30.0–36.0)
MCV: 86.2 fL (ref 78.0–100.0)
Platelets: 150 10*3/uL (ref 150–400)
RBC: 4.78 MIL/uL (ref 4.22–5.81)
RDW: 13.2 % (ref 11.5–15.5)
WBC: 14.2 10*3/uL — AB (ref 4.0–10.5)

## 2016-04-15 MED ORDER — HYDROMORPHONE HCL 2 MG/ML IJ SOLN
1.0000 mg | Freq: Once | INTRAMUSCULAR | Status: AC
  Administered 2016-04-15: 1 mg via INTRAVENOUS
  Filled 2016-04-15: qty 1

## 2016-04-15 MED ORDER — HYDROMORPHONE HCL 2 MG/ML IJ SOLN
2.0000 mg | INTRAMUSCULAR | Status: DC | PRN
  Administered 2016-04-15 – 2016-04-16 (×8): 2 mg via INTRAVENOUS
  Filled 2016-04-15 (×8): qty 1

## 2016-04-15 MED ORDER — OXYCODONE-ACETAMINOPHEN 5-325 MG PO TABS
1.0000 | ORAL_TABLET | ORAL | Status: DC | PRN
  Administered 2016-04-15 – 2016-04-16 (×2): 2 via ORAL
  Filled 2016-04-15 (×3): qty 2

## 2016-04-15 MED ORDER — SODIUM CHLORIDE 0.9 % IV SOLN
INTRAVENOUS | Status: DC
  Administered 2016-04-15: 12:00:00 via INTRAVENOUS
  Administered 2016-04-16: 1000 mL via INTRAVENOUS

## 2016-04-15 MED ORDER — INFLUENZA VAC SPLIT QUAD 0.5 ML IM SUSY
0.5000 mL | PREFILLED_SYRINGE | INTRAMUSCULAR | Status: AC
Start: 2016-04-16 — End: 2016-04-17
  Administered 2016-04-17: 0.5 mL via INTRAMUSCULAR
  Filled 2016-04-15: qty 0.5

## 2016-04-15 MED ORDER — HYDROMORPHONE HCL 2 MG/ML IJ SOLN
1.0000 mg | INTRAMUSCULAR | Status: DC | PRN
  Administered 2016-04-15 (×4): 1 mg via INTRAVENOUS
  Filled 2016-04-15 (×4): qty 1

## 2016-04-15 MED ORDER — FAMOTIDINE 20 MG PO TABS
20.0000 mg | ORAL_TABLET | Freq: Two times a day (BID) | ORAL | Status: DC
  Administered 2016-04-15 – 2016-04-17 (×4): 20 mg via ORAL
  Filled 2016-04-15 (×4): qty 1

## 2016-04-15 NOTE — Progress Notes (Signed)
PROGRESS NOTE    Dakota Vang  [id number] DOB: 08/25/1965 DOA: 04/14/2016 PCP: No primary care provider on file.     Brief Narrative:  Dakota Vang is a 51 y.o. male with medical history significant for diverticulosis with recurrent diverticulitis, GERD, hypertension, depression, and anxiety, now presenting to the emergency department for evaluation of severe upper abdominal pain. Patient reports that he had been in his usual state of health until developing severe pain in his upper abdomen, just left of center. Pain was described as severe, sharp, stabbing, localized to the left upper abdomen, initially intermittent but constant today, and with no alleviating or exacerbating factors identified. Patient reports that he had had diarrhea approximately 2 days ago without blood, but that has apparently resolved. He denies fevers or chills and denies vomiting. He has had recurrent diverticulitis and has required laparotomy in the past for perforation.    Assessment & Plan:   Principal Problem:   Acute diverticulitis Active Problems:   Essential hypertension   G E R D   Depression with anxiety   Diverticulitis  Acute diverticulitis  - Pt with known pancolonic diverticulosis and recurrent diverticulitis, once requiring laporotomy for perforation  - CT findings consistent with acute diverticulitis at splenic flexure without abscess or perforation  - Empiric Cipro and Flagyl, IVF hydration, prn analgesics and antiemetics, full liquid diet as tolerated    Hypertension  - Continue Norvasc  GERD - EGD from September 2008 with normal esophagus, mild gastritis  - PPI  Depression, anxiety  - Continue Lexapro, Lamictal, and prn Xanax   DVT prophylaxis: lovenox Code Status: full Family Communication: wife at bedside Disposition Plan: pending improvement, suspect return back home   Consultants:   None  Procedures:   None  Antimicrobials:  Anti-infectives    Start      Dose/Rate Route Frequency Ordered Stop   04/15/16 1000  ciprofloxacin (CIPRO) IVPB 400 mg     400 mg 200 mL/hr over 60 Minutes Intravenous Every 12 hours 04/14/16 2353     04/14/16 2300  ciprofloxacin (CIPRO) IVPB 400 mg     400 mg 200 mL/hr over 60 Minutes Intravenous  Once 04/14/16 2246 04/14/16 2359   04/14/16 2300  metroNIDAZOLE (FLAGYL) tablet 500 mg     500 mg Oral Every 8 hours 04/14/16 2246          Subjective: Patient complains of severe abdominal pain, epigastric radiating to the left. No further episodes of nausea, vomiting, no diarrhea. Dilaudid seems to help with pain.   Objective: Vitals:   04/14/16 2330 04/15/16 0000 04/15/16 0030 04/15/16 0510  BP:  106/66 (!) 146/93 139/89  Pulse:  106 (!) 104 94  Resp:  14 18 20   Temp:   98.6 F (37 C) 98.6 F (37 C)  TempSrc:   Axillary Oral  SpO2:  95% 97% 97%  Weight: 106 kg (233 lb 11 oz)  108.3 kg (238 lb 11.2 oz)   Height:   6\' 1"  (1.854 m)     Intake/Output Summary (Last 24 hours) at 04/15/16 1158 Last data filed at 04/15/16 0923  Gross per 24 hour  Intake           1649.5 ml  Output             1660 ml  Net            -10.5 ml   Filed Weights   04/14/16 2330 04/15/16 0030  Weight: 106 kg (233  lb 11 oz) 108.3 kg (238 lb 11.2 oz)    Examination:  General exam: Appears calm and comfortable  Respiratory system: Clear to auscultation. Respiratory effort normal. Cardiovascular system: S1 & S2 heard, RRR. No JVD, murmurs, rubs, gallops or clicks. No pedal edema. Gastrointestinal system: Abdomen is nondistended, soft and TTP epigastric, LUQ and LLQ. No organomegaly or masses felt. Normal bowel sounds heard. Central nervous system: Alert and oriented. No focal neurological deficits. Extremities: Symmetric 5 x 5 power. Skin: No rashes, lesions or ulcers Psychiatry: Judgement and insight appear normal. Mood & affect appropriate.   Data Reviewed: I have personally reviewed following labs and imaging  studies  CBC:  Recent Labs Lab 04/14/16 2134 04/15/16 0447  WBC 17.2* 14.2*  HGB 15.5 13.9  HCT 44.1 41.2  MCV 84.8 86.2  PLT 179 [id number]   Basic Metabolic Panel:  Recent Labs Lab 04/14/16 2134 04/15/16 0447  NA 130* 138  K 3.8 4.3  CL 97* 102  CO2 24 28  GLUCOSE 109* 106*  BUN 12 13  CREATININE 1.03 1.06  CALCIUM 8.8* 8.7*   GFR: Estimated Creatinine Clearance: 107.7 mL/min (by C-G formula based on SCr of 1.06 mg/dL). Liver Function Tests:  Recent Labs Lab 04/14/16 2134  AST 20  ALT 32  ALKPHOS 81  BILITOT 0.2*  PROT 7.1  ALBUMIN 4.6    Recent Labs Lab 04/14/16 2134  LIPASE 28   No results for input(s): AMMONIA in the last 168 hours. Coagulation Profile: No results for input(s): INR, PROTIME in the last 168 hours. Cardiac Enzymes: No results for input(s): CKTOTAL, CKMB, CKMBINDEX, TROPONINI in the last 168 hours. BNP (last 3 results) No results for input(s): PROBNP in the last 8760 hours. HbA1C: No results for input(s): HGBA1C in the last 72 hours. CBG: No results for input(s): GLUCAP in the last 168 hours. Lipid Profile: No results for input(s): CHOL, HDL, LDLCALC, TRIG, CHOLHDL, LDLDIRECT in the last 72 hours. Thyroid Function Tests: No results for input(s): TSH, T4TOTAL, FREET4, T3FREE, THYROIDAB in the last 72 hours. Anemia Panel: No results for input(s): VITAMINB12, FOLATE, FERRITIN, TIBC, IRON, RETICCTPCT in the last 72 hours. Sepsis Labs: No results for input(s): PROCALCITON, LATICACIDVEN in the last 168 hours.  No results found for this or any previous visit (from the past 240 hour(s)).     Radiology Studies: Dg Chest 2 View  Result Date: 04/14/2016 CLINICAL DATA:  Lower mid chest and upper abdominal pain EXAM: CHEST  2 VIEW COMPARISON:  09/21/2012 FINDINGS: The heart size and mediastinal contours are within normal limits. Both lungs are clear. The visualized skeletal structures are unremarkable. IMPRESSION: No active cardiopulmonary  disease. Electronically Signed   By: Donavan Foil M.D.   On: 04/14/2016 22:09   Ct Abdomen Pelvis W Contrast  Result Date: 04/14/2016 CLINICAL DATA:  c/o lower mid chest and LUQ upper abdominal pain intermittently onset yesterday and is now consistent today. Pt reports diarrhea onset a couple days ago 100 ml isovue 300^1 ISOVUE-300 IOPAMIDOL (ISOVUE-300) INJECTION 61% EXAM: CT ABDOMEN AND PELVIS WITH CONTRAST TECHNIQUE: Multidetector CT imaging of the abdomen and pelvis was performed using the standard protocol following bolus administration of intravenous contrast. CONTRAST:  1 ISOVUE-300 IOPAMIDOL (ISOVUE-300) INJECTION 61% COMPARISON:  04/02/2013 FINDINGS: Lower chest: No acute abnormality. Hepatobiliary: There is diffuse low-attenuation of the liver. No focal liver lesions are identified. Gallbladder is present and normal in CT appearance. Pancreas: Unremarkable. No pancreatic ductal dilatation or surrounding inflammatory changes. Spleen: Normal in size  without focal abnormality. Adrenals/Urinary Tract: The adrenal glands are normal in appearance. No hydronephrosis. Stomach/Bowel: The stomach has a normal appearance. Small bowel loops are normal in appearance. Unremarkable appearance of a small bowel to small bowel anastomosis in the mid abdomen. The appendix is well seen and has a normal appearance. There are extensive diverticula throughout the colon. There is inflammatory change in the fat surrounding the splenic flexure in this segment with numerous diverticula. The findings are consistent with acute diverticulitis. No associated perforation or abscess. Vascular/Lymphatic: There is atherosclerotic calcification of the abdominal aorta. No aneurysm. No retroperitoneal or mesenteric adenopathy. Reproductive: Uterus and bilateral adnexa are unremarkable. Other: Fat containing bilateral inguinal hernias.  No ascites. Musculoskeletal: No acute or significant osseous findings. IMPRESSION: 1. Acute  diverticulitis involving the splenic flexure of the colon. No associated abscess or perforation. 2. Hepatic steatosis. Electronically Signed   By: Nolon Nations M.D.   On: 04/14/2016 22:37      Scheduled Meds: . amLODipine  5 mg Oral Daily  . ciprofloxacin  400 mg Intravenous Q12H  . enoxaparin (LOVENOX) injection  40 mg Subcutaneous QHS  . escitalopram  20 mg Oral Daily  . famotidine (PEPCID) IV  20 mg Intravenous Q12H  . [START ON 04/16/2016] Influenza vac split quadrivalent PF  0.5 mL Intramuscular Tomorrow-1000  . lamoTRIgine  150 mg Oral BID  . metroNIDAZOLE  500 mg Oral Q8H  . pantoprazole  40 mg Oral Daily  . rosuvastatin  10 mg Oral Daily   Continuous Infusions:   LOS: 0 days    Time spent: 40 minutes   Dessa Phi, DO [facility] [email or web address] Password [email or web address] 04/15/2016, 11:58 AM

## 2016-04-15 NOTE — Progress Notes (Signed)
Pt requested pain med. I told pt it was not not time for him Dilaudid yet because he had it last at 0455 and it was too early and it was due at 0755. He asked me what he could have, I told him 2 Vicodin. I administered 2 Vicodin. He told me that he really needed Dilaudid, and asked me "So do I have to suffer until my Dilaudid is due?"  I told him I would text page on call provider and ask them if he could have another dose of Dilaudid.  Provider d/c'd the vicodin, and ordered 2 Percocet.  Will continue to monitor.

## 2016-04-16 DIAGNOSIS — K5792 Diverticulitis of intestine, part unspecified, without perforation or abscess without bleeding: Secondary | ICD-10-CM | POA: Diagnosis not present

## 2016-04-16 LAB — CBC WITH DIFFERENTIAL/PLATELET
BASOS PCT: 0 %
Basophils Absolute: 0 10*3/uL (ref 0.0–0.1)
Eosinophils Absolute: 0.1 10*3/uL (ref 0.0–0.7)
Eosinophils Relative: 0 %
HEMATOCRIT: 35 % — AB (ref 39.0–52.0)
HEMOGLOBIN: 11.8 g/dL — AB (ref 13.0–17.0)
Lymphocytes Relative: 14 %
Lymphs Abs: 1.8 10*3/uL (ref 0.7–4.0)
MCH: 29.6 pg (ref 26.0–34.0)
MCHC: 33.7 g/dL (ref 30.0–36.0)
MCV: 87.9 fL (ref 78.0–100.0)
MONO ABS: 1.1 10*3/uL — AB (ref 0.1–1.0)
MONOS PCT: 9 %
NEUTROS ABS: 9.8 10*3/uL — AB (ref 1.7–7.7)
Neutrophils Relative %: 77 %
Platelets: 136 10*3/uL — ABNORMAL LOW (ref 150–400)
RBC: 3.98 MIL/uL — ABNORMAL LOW (ref 4.22–5.81)
RDW: 13.4 % (ref 11.5–15.5)
WBC: 12.8 10*3/uL — ABNORMAL HIGH (ref 4.0–10.5)

## 2016-04-16 LAB — BASIC METABOLIC PANEL
ANION GAP: 7 (ref 5–15)
BUN: 8 mg/dL (ref 6–20)
CHLORIDE: 101 mmol/L (ref 101–111)
CO2: 28 mmol/L (ref 22–32)
Calcium: 8.8 mg/dL — ABNORMAL LOW (ref 8.9–10.3)
Creatinine, Ser: 0.96 mg/dL (ref 0.61–1.24)
GFR calc non Af Amer: >60 mL/min (ref >60)
GLUCOSE: 139 mg/dL — AB (ref 65–99)
Potassium: 4 mmol/L (ref 3.5–5.1)
Sodium: 136 mmol/L (ref 135–145)

## 2016-04-16 MED ORDER — OXYCODONE-ACETAMINOPHEN 5-325 MG PO TABS
1.0000 | ORAL_TABLET | ORAL | Status: DC | PRN
  Administered 2016-04-16 – 2016-04-17 (×10): 2 via ORAL
  Filled 2016-04-16 (×11): qty 2

## 2016-04-16 NOTE — Progress Notes (Signed)
PROGRESS NOTE    Dakota Vang  [id number] DOB: 1965-06-26 DOA: 04/14/2016 PCP: No primary care provider on file.     Brief Narrative:  Dakota Vang is a 51 y.o. male with medical history significant for diverticulosis with recurrent diverticulitis, GERD, hypertension, depression, and anxiety, now presenting to the emergency department for evaluation of severe upper abdominal pain. Patient reports that he had been in his usual state of health until developing severe pain in his upper abdomen, just left of center. Pain was described as severe, sharp, stabbing, localized to the left upper abdomen, initially intermittent but constant today, and with no alleviating or exacerbating factors identified. Patient reports that he had had diarrhea approximately 2 days ago without blood, but that has apparently resolved. He denies fevers or chills and denies vomiting. He has had recurrent diverticulitis and has required laparotomy in the past for perforation.   He was admitted for diverticulitis, seen on CT abd/pelvis without perforation or abscess. He was treated conservatively with IVF, empiric cipro/flagyl, bowel rest, pain medication.    Assessment & Plan:   Principal Problem:   Acute diverticulitis Active Problems:   Essential hypertension   G E R D   Depression with anxiety   Diverticulitis  Acute diverticulitis  - Pt with known pancolonic diverticulosis and recurrent diverticulitis, once requiring laporotomy for perforation  - CT findings consistent with acute diverticulitis at splenic flexure without abscess or perforation  - Empiric Cipro and Flagyl, IVF hydration, prn analgesics and antiemetics, will advance diet to soft today. Continues to have abdominal pain, which is improved since admission.   Hypertension  - Continue Norvasc  GERD - EGD from September 2008 with normal esophagus, mild gastritis  - PPI  Depression, anxiety  - Continue Lexapro, Lamictal, and prn  Xanax   DVT prophylaxis: lovenox Code Status: full Family Communication: wife at bedside Disposition Plan: pending improvement, suspect return back home next 1-2 days    Consultants:   None  Procedures:   None  Antimicrobials:  Anti-infectives    Start     Dose/Rate Route Frequency Ordered Stop   04/15/16 1000  ciprofloxacin (CIPRO) IVPB 400 mg     400 mg 200 mL/hr over 60 Minutes Intravenous Every 12 hours 04/14/16 2353     04/14/16 2300  ciprofloxacin (CIPRO) IVPB 400 mg     400 mg 200 mL/hr over 60 Minutes Intravenous  Once 04/14/16 2246 04/14/16 2359   04/14/16 2300  metroNIDAZOLE (FLAGYL) tablet 500 mg     500 mg Oral Every 8 hours 04/14/16 2246         Subjective: Abdominal pain is improved since admission, but still present. States that his pain is LLQ now. No vomiting although continues to be nauseous. Zofran helps. We are trying to wean his pain medication to oral. Has not had a BM yet.    Objective: Vitals:   04/15/16 0510 04/15/16 1354 04/15/16 2108 04/16/16 0445  BP: 139/89 (!) 132/93 139/81 121/73  Pulse: 94 96 96 (!) 105  Resp: 20 19 20 20   Temp: 98.6 F (37 C) 98.8 F (37.1 C) 99.2 F (37.3 C) 99.8 F (37.7 C)  TempSrc: Oral Oral Oral Oral  SpO2: 97% 96% 96% 94%  Weight:      Height:        Intake/Output Summary (Last 24 hours) at 04/16/16 0953 Last data filed at 04/16/16 0800  Gross per 24 hour  Intake  4120 ml  Output             3600 ml  Net              520 ml   Filed Weights   04/14/16 2330 04/15/16 0030  Weight: 106 kg (233 lb 11 oz) 108.3 kg (238 lb 11.2 oz)    Examination:  General exam: Appears calm and comfortable  Respiratory system: Clear to auscultation. Respiratory effort normal. Cardiovascular system: S1 & S2 heard, RRR. No JVD, murmurs, rubs, gallops or clicks. No pedal edema. Gastrointestinal system: Abdomen is nondistended, soft and LLQ. No organomegaly or masses felt. Normal bowel sounds heard. Central  nervous system: Alert and oriented. No focal neurological deficits. Extremities: Symmetric 5 x 5 power. Skin: No rashes, lesions or ulcers Psychiatry: Judgement and insight appear normal. Mood & affect appropriate.   Data Reviewed: I have personally reviewed following labs and imaging studies  CBC:  Recent Labs Lab 04/14/16 2134 04/15/16 0447 04/16/16 0459  WBC 17.2* 14.2* 12.8*  NEUTROABS  --   --  9.8*  HGB 15.5 13.9 11.8*  HCT 44.1 41.2 35.0*  MCV 84.8 86.2 87.9  PLT 179 150 [id number]*   Basic Metabolic Panel:  Recent Labs Lab 04/14/16 2134 04/15/16 0447 04/16/16 0459  NA 130* 138 136  K 3.8 4.3 4.0  CL 97* 102 101  CO2 24 28 28   GLUCOSE 109* 106* 139*  BUN 12 13 8   CREATININE 1.03 1.06 0.96  CALCIUM 8.8* 8.7* 8.8*   GFR: Estimated Creatinine Clearance: 118.9 mL/min (by C-G formula based on SCr of 0.96 mg/dL). Liver Function Tests:  Recent Labs Lab 04/14/16 2134  AST 20  ALT 32  ALKPHOS 81  BILITOT 0.2*  PROT 7.1  ALBUMIN 4.6    Recent Labs Lab 04/14/16 2134  LIPASE 28   No results for input(s): AMMONIA in the last 168 hours. Coagulation Profile: No results for input(s): INR, PROTIME in the last 168 hours. Cardiac Enzymes: No results for input(s): CKTOTAL, CKMB, CKMBINDEX, TROPONINI in the last 168 hours. BNP (last 3 results) No results for input(s): PROBNP in the last 8760 hours. HbA1C: No results for input(s): HGBA1C in the last 72 hours. CBG: No results for input(s): GLUCAP in the last 168 hours. Lipid Profile: No results for input(s): CHOL, HDL, LDLCALC, TRIG, CHOLHDL, LDLDIRECT in the last 72 hours. Thyroid Function Tests: No results for input(s): TSH, T4TOTAL, FREET4, T3FREE, THYROIDAB in the last 72 hours. Anemia Panel: No results for input(s): VITAMINB12, FOLATE, FERRITIN, TIBC, IRON, RETICCTPCT in the last 72 hours. Sepsis Labs: No results for input(s): PROCALCITON, LATICACIDVEN in the last 168 hours.  No results found for this or any  previous visit (from the past 240 hour(s)).     Radiology Studies: Dg Chest 2 View  Result Date: 04/14/2016 CLINICAL DATA:  Lower mid chest and upper abdominal pain EXAM: CHEST  2 VIEW COMPARISON:  09/21/2012 FINDINGS: The heart size and mediastinal contours are within normal limits. Both lungs are clear. The visualized skeletal structures are unremarkable. IMPRESSION: No active cardiopulmonary disease. Electronically Signed   By: Donavan Foil M.D.   On: 04/14/2016 22:09   Ct Abdomen Pelvis W Contrast  Result Date: 04/14/2016 CLINICAL DATA:  c/o lower mid chest and LUQ upper abdominal pain intermittently onset yesterday and is now consistent today. Pt reports diarrhea onset a couple days ago 100 ml isovue 300^1 ISOVUE-300 IOPAMIDOL (ISOVUE-300) INJECTION 61% EXAM: CT ABDOMEN AND PELVIS WITH CONTRAST TECHNIQUE: Multidetector  CT imaging of the abdomen and pelvis was performed using the standard protocol following bolus administration of intravenous contrast. CONTRAST:  1 ISOVUE-300 IOPAMIDOL (ISOVUE-300) INJECTION 61% COMPARISON:  04/02/2013 FINDINGS: Lower chest: No acute abnormality. Hepatobiliary: There is diffuse low-attenuation of the liver. No focal liver lesions are identified. Gallbladder is present and normal in CT appearance. Pancreas: Unremarkable. No pancreatic ductal dilatation or surrounding inflammatory changes. Spleen: Normal in size without focal abnormality. Adrenals/Urinary Tract: The adrenal glands are normal in appearance. No hydronephrosis. Stomach/Bowel: The stomach has a normal appearance. Small bowel loops are normal in appearance. Unremarkable appearance of a small bowel to small bowel anastomosis in the mid abdomen. The appendix is well seen and has a normal appearance. There are extensive diverticula throughout the colon. There is inflammatory change in the fat surrounding the splenic flexure in this segment with numerous diverticula. The findings are consistent with acute  diverticulitis. No associated perforation or abscess. Vascular/Lymphatic: There is atherosclerotic calcification of the abdominal aorta. No aneurysm. No retroperitoneal or mesenteric adenopathy. Reproductive: Uterus and bilateral adnexa are unremarkable. Other: Fat containing bilateral inguinal hernias.  No ascites. Musculoskeletal: No acute or significant osseous findings. IMPRESSION: 1. Acute diverticulitis involving the splenic flexure of the colon. No associated abscess or perforation. 2. Hepatic steatosis. Electronically Signed   By: Nolon Nations M.D.   On: 04/14/2016 22:37      Scheduled Meds: . amLODipine  5 mg Oral Daily  . ciprofloxacin  400 mg Intravenous Q12H  . enoxaparin (LOVENOX) injection  40 mg Subcutaneous QHS  . escitalopram  20 mg Oral Daily  . famotidine  20 mg Oral BID  . Influenza vac split quadrivalent PF  0.5 mL Intramuscular Tomorrow-1000  . lamoTRIgine  150 mg Oral BID  . metroNIDAZOLE  500 mg Oral Q8H  . pantoprazole  40 mg Oral Daily  . rosuvastatin  10 mg Oral Daily   Continuous Infusions: . sodium chloride 125 mL/hr at 04/16/16 0800     LOS: 0 days    Time spent: 30 minutes   Dessa Phi, DO [facility] [email or web address] Password [email or web address] 04/16/2016, 9:53 AM

## 2016-04-17 DIAGNOSIS — K219 Gastro-esophageal reflux disease without esophagitis: Secondary | ICD-10-CM

## 2016-04-17 DIAGNOSIS — I10 Essential (primary) hypertension: Secondary | ICD-10-CM

## 2016-04-17 DIAGNOSIS — F411 Generalized anxiety disorder: Secondary | ICD-10-CM

## 2016-04-17 DIAGNOSIS — F341 Dysthymic disorder: Secondary | ICD-10-CM

## 2016-04-17 DIAGNOSIS — F418 Other specified anxiety disorders: Secondary | ICD-10-CM | POA: Diagnosis not present

## 2016-04-17 DIAGNOSIS — K5732 Diverticulitis of large intestine without perforation or abscess without bleeding: Principal | ICD-10-CM

## 2016-04-17 LAB — CBC WITH DIFFERENTIAL/PLATELET
BASOS ABS: 0 10*3/uL (ref 0.0–0.1)
BASOS PCT: 0 %
Eosinophils Absolute: 0.3 10*3/uL (ref 0.0–0.7)
Eosinophils Relative: 2 %
HEMATOCRIT: 36.8 % — AB (ref 39.0–52.0)
Hemoglobin: 12.3 g/dL — ABNORMAL LOW (ref 13.0–17.0)
Lymphocytes Relative: 19 %
Lymphs Abs: 2.1 10*3/uL (ref 0.7–4.0)
MCH: 28.9 pg (ref 26.0–34.0)
MCHC: 33.4 g/dL (ref 30.0–36.0)
MCV: 86.6 fL (ref 78.0–100.0)
MONO ABS: 1 10*3/uL (ref 0.1–1.0)
MONOS PCT: 9 %
NEUTROS ABS: 7.7 10*3/uL (ref 1.7–7.7)
NEUTROS PCT: 70 %
Platelets: 145 10*3/uL — ABNORMAL LOW (ref 150–400)
RBC: 4.25 MIL/uL (ref 4.22–5.81)
RDW: 13.4 % (ref 11.5–15.5)
WBC: 11 10*3/uL — ABNORMAL HIGH (ref 4.0–10.5)

## 2016-04-17 MED ORDER — OXYCODONE-ACETAMINOPHEN 5-325 MG PO TABS: 1.0000 | ORAL_TABLET | Freq: Four times a day (QID) | ORAL | 0 refills | Status: DC | PRN

## 2016-04-17 MED ORDER — CIPROFLOXACIN HCL 500 MG PO TABS: 500.0000 mg | ORAL_TABLET | Freq: Two times a day (BID) | ORAL | 0 refills | Status: DC

## 2016-04-17 MED ORDER — METRONIDAZOLE 500 MG PO TABS: 500.0000 mg | ORAL_TABLET | Freq: Three times a day (TID) | ORAL | 0 refills | Status: DC

## 2016-04-17 NOTE — Progress Notes (Signed)
Pharmacy Antibiotic Note  Dakota Vang is a 51 y.o. male admitted on 04/14/2016 with Intra-abdominal infection.  Pharmacy has been consulted for Ciprofloxacin dosing.  Plan: Continue Ciprofloxacin 400mg  iv q12hr Pharmacy will sign off as dose is appropriate for indication and renal function Please re-consult as needed  Height: 6\' 1"  (185.4 cm) Weight: 238 lb 11.2 oz (108.3 kg) IBW/kg (Calculated) : 79.9  Temp (24hrs), Avg:98.8 F (37.1 C), Min:98.2 F (36.8 C), Max:99.3 F (37.4 C)   Recent Labs Lab 04/14/16 2134 04/15/16 0447 04/16/16 0459 04/17/16 0506  WBC 17.2* 14.2* 12.8* 11.0*  CREATININE 1.03 1.06 0.96  --     Estimated Creatinine Clearance: 118.9 mL/min (by C-G formula based on SCr of 0.96 mg/dL).    Allergies  Allergen Reactions  . Penicillins Other (See Comments)    Reaction:  Unknown  Has patient had a PCN reaction causing immediate rash, facial/tongue/throat swelling, SOB or lightheadedness with hypotension: Unsure Has patient had a PCN reaction causing severe rash involving mucus membranes or skin necrosis: Unsure Has patient had a PCN reaction that required hospitalization Unsure Has patient had a PCN reaction occurring within the last 10 years: No If all of the above answers are "NO", then may proceed with Cephalosporin use.    Antimicrobials this admission: Ciprofloxacin 2/4 >> Flagyl 2/4 >>   Microbiology results:   Thank you for allowing pharmacy to be a part of this patient's care.  Dolly Rias RPh 04/17/2016, 10:29 AM Pager [phone]

## 2016-04-17 NOTE — Progress Notes (Signed)
Discharge instructions reviewed with patient and spouse.  Patient verbalized understanding.  Shaiden Aldous RN

## 2016-04-17 NOTE — Discharge Instructions (Signed)
Diverticulitis °Diverticulitis is when small pockets that have formed in your colon (large intestine) become infected or swollen. °Follow these instructions at home: °· Follow your doctor's instructions. °· Follow a special diet if told by your doctor. °· When you feel better, your doctor may tell you to change your diet. You may be told to eat a lot of fiber. Fruits and vegetables are good sources of fiber. Fiber makes it easier to poop (have bowel movements). °· Take supplements or probiotics as told by your doctor. °· Only take medicines as told by your doctor. °· Keep all follow-up visits with your doctor. °Contact a doctor if: °· Your pain does not get better. °· You have a hard time eating food. °· You are not pooping like normal. °Get help right away if: °· Your pain gets worse. °· Your problems do not get better. °· Your problems suddenly get worse. °· You have a fever. °· You keep throwing up (vomiting). °· You have bloody or black, tarry poop (stool). °This information is not intended to replace advice given to you by your health care provider. Make sure you discuss any questions you have with your health care provider. °Document Released: 08/14/2007 Document Revised: 08/03/2015 Document Reviewed: 01/20/2013 °Elsevier Interactive Patient Education © 2017 [employer] ° °

## 2016-04-17 NOTE — Discharge Summary (Signed)
Physician Discharge Summary  Dakota Vang [id number] DOB: [birth year] DOA: 04/14/2016  PCP: No primary care provider on file.  Admit date: 04/14/2016 Discharge date: 04/17/2016  Time spent: 35 minutes  Recommendations for Outpatient Follow-up:  1. Repeat CBC to follow WBC's trend  2. Please arrange follow up with GI for outpatient colonoscopy in 4-6 weeks.   Discharge Diagnoses:  Principal Problem:   Acute diverticulitis Active Problems:   Essential hypertension   G E R D   Depression with anxiety   Diverticulitis   Diverticulitis of large intestine without perforation or abscess without bleeding   Discharge Condition: stable and improved. Discharge home with instructions to follow up with PCP in 10 days.  Diet recommendation: heart healthy and low residue diet   Filed Weights   04/14/16 2330 04/15/16 0030  Weight: 106 kg (233 lb 11 oz) 108.3 kg (238 lb 11.2 oz)    History of present illness:  51 y.o.malewith medical history significant for diverticulosis with recurrent diverticulitis, GERD, hypertension, depression, and anxiety, now presenting to the emergency department for evaluation of severe upper abdominal pain. Patient reports that he had been in his usual state of health until developing severe pain in his upper abdomen, just left of center. Pain was described as severe, sharp, stabbing, localized to the left upper abdomen, initially intermittent but constant today, and with no alleviating or exacerbating factors identified. Patient reports that he had had diarrhea approximately 2 days ago without blood, but that has apparently resolved. He denies fevers or chills and denies vomiting. He has had recurrent diverticulitis and has required laparotomy in the past for perforation.  He was admitted for diverticulitis, seen on CT abd/pelvis without perforation or abscess.  Hospital Course:  Acute diverticulitis: w/o perforation or abscess  - Pt with known pancolonic  diverticulosis and recurrent diverticulitis, once requiring laporotomy for perforation.  -CT findings consistent with acute diverticulitis at splenic flexure without abscess or perforation  -treated conservatively with Cipro and Flagyl (though his veins while having trouble tolerating PO's), IVF hydration, prn analgesics and antiemetics. -At discharge symptoms improved and patient now able to tolerate diet and PO antibiotics -discharge on cipro and flagyl to complete a total of 10 days of antibiotics. -advise to follow low residue diet, to keep himself well hydrated and to use PRN Miralax to help reducing constipation. -will follow up with PCP in 10 days -will benefit of follow up with GI in 4-6 weeks for repeat colonoscopy   Hypertension  - Continue Norvasc -advise to follow heart healthy diet   GERD - EGD from September 2008 with normal esophagus, mild gastritis  - Continue PPI  Depression, anxiety  - Continue Lexapro, Lamictal, and prn Xanax -patient w/o SI or hallucinations   HLD -continue statins   Procedures:  See below for x-ray reports   Consultations:  None   Discharge Exam: Vitals:   04/17/16 0603 04/17/16 1450  BP: 114/67 127/84  Pulse: 78 79  Resp: 18   Temp: 98.2 F (36.8 C) 98.4 F (36.9 C)   General exam: Appears calm and comfortable, no nausea, no vomiting. Patient is afebrile and tolerating PO meds and diet. Respiratory system: Clear to auscultation. Respiratory effort normal. Cardiovascular system: S1 & S2 heard, RRR. No JVD, murmurs, rubs, gallops or clicks. No pedal edema. Gastrointestinal system: Abdomen is nondistended, soft and with just mild LLQ tenderness on palpation. No organomegaly or masses felt. Normal bowel sounds heard. Central nervous system: Alert and oriented. No focal neurological  deficits. Extremities: Symmetric 5 x 5 power. Skin: No rashes, lesions or ulcers Psychiatry: Judgement and insight appear normal. Mood & affect  appropriate.   Discharge Instructions   Discharge Instructions    Diet - low sodium heart healthy    Complete by:  As directed    Discharge instructions    Complete by:  As directed    Keep yourself well hydrated Take medications as prescribed  Please arrange follow up with PCP in 10 days Contact GI practice to arrange follow up with gastroenterologist in 4 weeks (you will need repeat colonoscopy most likely) Follow low residue diet  Use miralax (OTC) to maintain loose stools     Current Discharge Medication List    START taking these medications   Details  ciprofloxacin (CIPRO) 500 MG tablet Take 1 tablet (500 mg total) by mouth 2 (two) times daily. Qty: 14 tablet, Refills: 0    metroNIDAZOLE (FLAGYL) 500 MG tablet Take 1 tablet (500 mg total) by mouth every 8 (eight) hours. Qty: 21 tablet, Refills: 0    oxyCODONE-acetaminophen (PERCOCET/ROXICET) 5-325 MG tablet Take 1-2 tablets by mouth every 6 (six) hours as needed for severe pain. Qty: 40 tablet, Refills: 0      CONTINUE these medications which have NOT CHANGED   Details  ALPRAZolam (XANAX) 1 MG tablet Take 1 mg by mouth daily as needed for anxiety.    amLODipine (NORVASC) 5 MG tablet Take 5 mg by mouth daily.    aspirin EC 81 MG tablet Take 81 mg by mouth daily.     clindamycin (CLEOCIN T) 1 % external solution Apply 1 application topically as needed (for breakouts on neck).     escitalopram (LEXAPRO) 20 MG tablet Take 1 tablet (20 mg total) by mouth daily. Qty: 90 tablet, Refills: 0   Associated Diagnoses: Major depressive disorder, recurrent episode, mild (HCC)    fluticasone (FLONASE) 50 MCG/ACT nasal spray Place 2 sprays into both nostrils daily as needed for rhinitis.    lamoTRIgine (LAMICTAL) 150 MG tablet Take 1 tablet (150 mg total) by mouth 2 (two) times daily. Qty: 180 tablet, Refills: 0   Associated Diagnoses: Major depressive disorder, recurrent episode, mild (HCC)    lisinopril (PRINIVIL,ZESTRIL)  30 MG tablet Take 1 tablet (30 mg total) by mouth daily. Qty: 30 tablet, Refills: 6    methylphenidate (RITALIN) 10 MG tablet Take 10 mg by mouth 3 (three) times daily.    omeprazole (PRILOSEC) 20 MG capsule Take 20 mg by mouth daily.    rosuvastatin (CRESTOR) 10 MG tablet Take 1 tablet (10 mg total) by mouth daily. Qty: 90 tablet, Refills: 1   Associated Diagnoses: Hyperlipidemia      STOP taking these medications     ramipril (ALTACE) 10 MG capsule        Allergies  Allergen Reactions  . Penicillins Other (See Comments)    Reaction:  Unknown  Has patient had a PCN reaction causing immediate rash, facial/tongue/throat swelling, SOB or lightheadedness with hypotension: Unsure Has patient had a PCN reaction causing severe rash involving mucus membranes or skin necrosis: Unsure Has patient had a PCN reaction that required hospitalization Unsure Has patient had a PCN reaction occurring within the last 10 years: No If all of the above answers are "NO", then may proceed with Cephalosporin use.    The results of significant diagnostics from this hospitalization (including imaging, microbiology, ancillary and laboratory) are listed below for reference.    Significant Diagnostic Studies: Dg Chest  2 View  Result Date: 04/14/2016 CLINICAL DATA:  Lower mid chest and upper abdominal pain EXAM: CHEST  2 VIEW COMPARISON:  09/21/2012 FINDINGS: The heart size and mediastinal contours are within normal limits. Both lungs are clear. The visualized skeletal structures are unremarkable. IMPRESSION: No active cardiopulmonary disease. Electronically Signed   By: Donavan Foil M.D.   On: 04/14/2016 22:09   Ct Abdomen Pelvis W Contrast  Result Date: 04/14/2016 CLINICAL DATA:  c/o lower mid chest and LUQ upper abdominal pain intermittently onset yesterday and is now consistent today. Pt reports diarrhea onset a couple days ago 100 ml isovue 300^1 ISOVUE-300 IOPAMIDOL (ISOVUE-300) INJECTION 61% EXAM: CT  ABDOMEN AND PELVIS WITH CONTRAST TECHNIQUE: Multidetector CT imaging of the abdomen and pelvis was performed using the standard protocol following bolus administration of intravenous contrast. CONTRAST:  1 ISOVUE-300 IOPAMIDOL (ISOVUE-300) INJECTION 61% COMPARISON:  04/02/2013 FINDINGS: Lower chest: No acute abnormality. Hepatobiliary: There is diffuse low-attenuation of the liver. No focal liver lesions are identified. Gallbladder is present and normal in CT appearance. Pancreas: Unremarkable. No pancreatic ductal dilatation or surrounding inflammatory changes. Spleen: Normal in size without focal abnormality. Adrenals/Urinary Tract: The adrenal glands are normal in appearance. No hydronephrosis. Stomach/Bowel: The stomach has a normal appearance. Small bowel loops are normal in appearance. Unremarkable appearance of a small bowel to small bowel anastomosis in the mid abdomen. The appendix is well seen and has a normal appearance. There are extensive diverticula throughout the colon. There is inflammatory change in the fat surrounding the splenic flexure in this segment with numerous diverticula. The findings are consistent with acute diverticulitis. No associated perforation or abscess. Vascular/Lymphatic: There is atherosclerotic calcification of the abdominal aorta. No aneurysm. No retroperitoneal or mesenteric adenopathy. Reproductive: Uterus and bilateral adnexa are unremarkable. Other: Fat containing bilateral inguinal hernias.  No ascites. Musculoskeletal: No acute or significant osseous findings. IMPRESSION: 1. Acute diverticulitis involving the splenic flexure of the colon. No associated abscess or perforation. 2. Hepatic steatosis. Electronically Signed   By: Nolon Nations M.D.   On: 04/14/2016 22:37    Microbiology: No results found for this or any previous visit (from the past 240 hour(s)).   Labs: Basic Metabolic Panel:  Recent Labs Lab 04/14/16 2134 04/15/16 0447 04/16/16 0459  NA  130* 138 136  K 3.8 4.3 4.0  CL 97* 102 101  CO2 24 28 28   GLUCOSE 109* 106* 139*  BUN 12 13 8   CREATININE 1.03 1.06 0.96  CALCIUM 8.8* 8.7* 8.8*   Liver Function Tests:  Recent Labs Lab 04/14/16 2134  AST 20  ALT 32  ALKPHOS 81  BILITOT 0.2*  PROT 7.1  ALBUMIN 4.6    Recent Labs Lab 04/14/16 2134  LIPASE 28   CBC:  Recent Labs Lab 04/14/16 2134 04/15/16 0447 04/16/16 0459 04/17/16 0506  WBC 17.2* 14.2* 12.8* 11.0*  NEUTROABS  --   --  9.8* 7.7  HGB 15.5 13.9 11.8* 12.3*  HCT 44.1 41.2 35.0* 36.8*  MCV 84.8 86.2 87.9 86.6  PLT 179 150 136* 145*   Signed:  Barton Dubois MD.  [facility] 04/17/2016, 3:24 PM

## 2016-04-29 DIAGNOSIS — K5732 Diverticulitis of large intestine without perforation or abscess without bleeding: Secondary | ICD-10-CM | POA: Diagnosis not present

## 2016-05-08 ENCOUNTER — Encounter: Payer: Self-pay | Admitting: Gastroenterology

## 2016-05-08 ENCOUNTER — Ambulatory Visit (INDEPENDENT_AMBULATORY_CARE_PROVIDER_SITE_OTHER): Payer: Commercial Managed Care - HMO | Admitting: Gastroenterology

## 2016-05-08 VITALS — BP 122/88 | HR 80 | Ht 73.0 in | Wt 224.6 lb

## 2016-05-08 DIAGNOSIS — K5732 Diverticulitis of large intestine without perforation or abscess without bleeding: Secondary | ICD-10-CM

## 2016-05-08 DIAGNOSIS — Z86010 Personal history of colonic polyps: Secondary | ICD-10-CM

## 2016-05-08 DIAGNOSIS — Z800 Family history of malignant neoplasm of digestive organs: Secondary | ICD-10-CM | POA: Diagnosis not present

## 2016-05-08 MED ORDER — NA SULFATE-K SULFATE-MG SULF 17.5-3.13-1.6 GM/177ML PO SOLN: 1.0000 | Freq: Once | ORAL | 0 refills | Status: AC

## 2016-05-08 NOTE — Progress Notes (Addendum)
History of Present Illness: This is a 51 year male referred by Barton Dubois, MD for the evaluation of diverticulitis and an abnormal CT scan of the colon. He was hospitalized earlier this month for acute diverticulitis. See CT scan below. He was treated with a course of Cipro and Flagyl however following discharge he continued to have abdominal pain and was given a course of Septra DS for 10 days. He is now about day 5 of that course and his abdominal pain has resolved. He underwent a laparoscopic-assisted resection of proximal jejunum secondary to jejunal diverticulitis with perforation in 2014. His last colonoscopy in December 2013 showed moderate diverticulosis in the transverse colon and descending colon and sigmoid colon with mild diverticulosis in the ascending colon.  Abd/pelvic CT 04/14/2016 IMPRESSION: 1. Acute diverticulitis involving the splenic flexure of the colon. No associated abscess or perforation. 2. Hepatic steatosis.   Allergies  Allergen Reactions  . Penicillins Other (See Comments)    Reaction:  Unknown  Has patient had a PCN reaction causing immediate rash, facial/tongue/throat swelling, SOB or lightheadedness with hypotension: Unsure Has patient had a PCN reaction causing severe rash involving mucus membranes or skin necrosis: Unsure Has patient had a PCN reaction that required hospitalization Unsure Has patient had a PCN reaction occurring within the last 10 years: No If all of the above answers are "NO", then may proceed with Cephalosporin use.   Outpatient Medications Prior to Visit  Medication Sig Dispense Refill  . ALPRAZolam (XANAX) 1 MG tablet Take 1 mg by mouth daily as needed for anxiety.    Marland Kitchen amLODipine (NORVASC) 5 MG tablet Take 5 mg by mouth daily.    Marland Kitchen aspirin EC 81 MG tablet Take 81 mg by mouth daily.     . clindamycin (CLEOCIN T) 1 % external solution Apply 1 application topically as needed (for breakouts on neck).     Marland Kitchen escitalopram (LEXAPRO) 20 MG  tablet Take 1 tablet (20 mg total) by mouth daily. 90 tablet 0  . fluticasone (FLONASE) 50 MCG/ACT nasal spray Place 2 sprays into both nostrils daily as needed for rhinitis.    Marland Kitchen lamoTRIgine (LAMICTAL) 150 MG tablet Take 1 tablet (150 mg total) by mouth 2 (two) times daily. 180 tablet 0  . lisinopril (PRINIVIL,ZESTRIL) 30 MG tablet Take 1 tablet (30 mg total) by mouth daily. 30 tablet 6  . methylphenidate (RITALIN) 10 MG tablet Take 10 mg by mouth 3 (three) times daily.    Marland Kitchen omeprazole (PRILOSEC) 20 MG capsule Take 20 mg by mouth daily.    . rosuvastatin (CRESTOR) 10 MG tablet Take 1 tablet (10 mg total) by mouth daily. 90 tablet 1  . ciprofloxacin (CIPRO) 500 MG tablet Take 1 tablet (500 mg total) by mouth 2 (two) times daily. 14 tablet 0  . metroNIDAZOLE (FLAGYL) 500 MG tablet Take 1 tablet (500 mg total) by mouth every 8 (eight) hours. 21 tablet 0  . oxyCODONE-acetaminophen (PERCOCET/ROXICET) 5-325 MG tablet Take 1-2 tablets by mouth every 6 (six) hours as needed for severe pain. 40 tablet 0   No facility-administered medications prior to visit.    Past Medical History:  Diagnosis Date  . ADD (attention deficit disorder with hyperactivity)    Dr. Donalee Citrin  . Anxiety    Dr. Donalee Citrin  . Colon polyps 2008   Dr Fuller Plan  . COLONIC POLYPS, HX OF 11/20/2007   Qualifier: Diagnosis of  By: Linna Darner MD, Gwyndolyn Saxon   Last colonoscopy negative 2013 PMH of  plyps X 2; Dr Fuller Plan, GI    . Diverticulitis    PMH of  . Diverticulitis of jejunum with microperforation s/p lap resection ID:[id number] 08/28/2012   Qualifier: Diagnosis of  By: Linna Darner MD, Gwyndolyn Saxon   6/1-3/14 [facility] : ? Jejunal perforation F/U CT recommended 08/24/12   . Diverticulitis of sigmoid colon 06/24/2008   2005 by CT scan    . Diverticulosis 2002   Dr Velora Heckler  . Duodenal diverticulum 08/28/2012  . GERD (gastroesophageal reflux disease)   . Hyperlipidemia   . Hypertension   . Pancolonic diverticulosis 09/09/2012   Past  Surgical History:  Procedure Laterality Date  . COLON SURGERY    . COLONOSCOPY  2013   Dr Fuller Plan  . COLONOSCOPY W/ POLYPECTOMY  2002 & 2008   Dr. Fuller Plan; polyps X 2  . LAPAROSCOPIC PARTIAL COLECTOMY N/A 09/24/2012   Procedure: laparoscopic assisted  resection of proximal jejunum containing the jejunal diverticulum;  Surgeon: [name], MD;  Location: [facility];  Service: General;  Laterality: N/A;  . LAPAROSCOPIC SMALL BOWEL RESECTION  09/24/2012   excision of proximal jejunum for jejunal diverticulitis  . LASIK     bilaterally  . TONSILLECTOMY     Social History   Social History  . Marital status: Married    Spouse name: N/A  . Number of children: N/A  . Years of education: N/A   Social History Main Topics  . Smoking status: Current Some Day Smoker    Types: Cigarettes  . Smokeless tobacco: Never Used     Comment: smoked 1988-present ,up to 1 ppd. 08/22/11 averaging 2-3 /day  . Alcohol use 3.6 - 4.8 oz/week    6 - 8 Cans of beer per week     Comment: Beer on weekend   . Drug use: No  . Sexual activity: Yes   Other Topics Concern  . None   Social History Narrative  . None   Family History  Problem Relation Age of Onset  . Colon cancer Father     in 61s  . Lung cancer Father     smoker; asbestos exposure  . Cancer Father     Mesothelioma  . Coronary artery disease Mother     CAGB X 2 & angio 3-4X  . Cancer Mother     Gallbladder  . Coronary artery disease Maternal Grandmother   . COPD Maternal Grandmother   . Heart attack Maternal Grandfather     late 60's  . Heart attack Paternal Grandfather 73  . Diabetes Paternal Uncle     Dialysis  . Stroke Neg Hx       Review of Systems: Pertinent positive and negative review of systems were noted in the above HPI section. All other review of systems were otherwise negative.    Physical Exam: General: Well developed, well nourished, no acute distress Head: Normocephalic and atraumatic Eyes:  sclerae anicteric,  EOMI Ears: Normal auditory acuity Mouth: No deformity or lesions Neck: Supple, no masses or thyromegaly Lungs: Clear throughout to auscultation Heart: Regular rate and rhythm; no murmurs, rubs or bruits Abdomen: Soft, non tender and non distended. No masses, hepatosplenomegaly or hernias noted. Normal Bowel sounds Rectal: deferred to colonoscopy Musculoskeletal: Symmetrical with no gross deformities  Skin: No lesions on visible extremities Pulses:  Normal pulses noted Extremities: No clubbing, cyanosis, edema or deformities noted Neurological: Alert oriented x 4, grossly nonfocal Cervical Nodes:  No significant cervical adenopathy Inguinal Nodes: No significant inguinal  adenopathy Psychological:  Alert and cooperative. Normal mood and affect  Assessment and Recommendations:  1.  Diverticulitis, resolving. Abnormal CT scan of the colon likely secondary to diverticulitis. Required a course of Cipro and Flagyl followed by a course of Septra DS. Complete the entire course of Septra DS. Long-term high fiber diet with adequate daily water intake. Schedule colonoscopy. The risks (including bleeding, perforation, infection, missed lesions, medication reactions and possible hospitalization or surgery if complications occur), benefits, and alternatives to colonoscopy with possible biopsy and possible polypectomy were discussed with the patient and they consent to proceed.   2. Family history of colon cancer and personal history of adenomatous colon polyps. Colonoscopy as above.  cc: Barton Dubois, MD

## 2016-05-08 NOTE — Patient Instructions (Signed)
You have been scheduled for a colonoscopy. Please follow written instructions given to you at your visit today.  Please pick up your prep supplies at the pharmacy within the next 1-3 days. If you use inhalers (even only as needed), please bring them with you on the day of your procedure. Your physician has requested that you go to www.startemmi.com and enter the access code given to you at your visit today. This web site gives a general overview about your procedure. However, you should still follow specific instructions given to you by our office regarding your preparation for the procedure.  Thank you for choosing me and [facility].  Malcolm T. Stark, Jr., MD., FACG  

## 2016-05-17 ENCOUNTER — Encounter: Payer: Self-pay | Admitting: Gastroenterology

## 2016-05-17 ENCOUNTER — Other Ambulatory Visit (HOSPITAL_COMMUNITY): Payer: Self-pay | Admitting: Psychiatry

## 2016-05-17 DIAGNOSIS — F330 Major depressive disorder, recurrent, mild: Secondary | ICD-10-CM

## 2016-05-20 ENCOUNTER — Other Ambulatory Visit (HOSPITAL_COMMUNITY): Payer: Self-pay

## 2016-05-20 DIAGNOSIS — F330 Major depressive disorder, recurrent, mild: Secondary | ICD-10-CM

## 2016-05-20 MED ORDER — ESCITALOPRAM OXALATE 20 MG PO TABS: 20.0000 mg | ORAL_TABLET | Freq: Every day | ORAL | 0 refills | Status: DC

## 2016-05-20 MED ORDER — LAMOTRIGINE 150 MG PO TABS: 150.0000 mg | ORAL_TABLET | Freq: Two times a day (BID) | ORAL | 0 refills | Status: DC

## 2016-05-29 ENCOUNTER — Encounter: Payer: Self-pay | Admitting: Gastroenterology

## 2016-05-29 ENCOUNTER — Ambulatory Visit (AMBULATORY_SURGERY_CENTER): Payer: Commercial Managed Care - HMO | Admitting: Gastroenterology

## 2016-05-29 VITALS — BP 126/78 | HR 76 | Temp 97.5°F | Resp 26 | Ht 73.0 in | Wt 224.0 lb

## 2016-05-29 DIAGNOSIS — Z86010 Personal history of colonic polyps: Secondary | ICD-10-CM

## 2016-05-29 DIAGNOSIS — Z800 Family history of malignant neoplasm of digestive organs: Secondary | ICD-10-CM | POA: Diagnosis not present

## 2016-05-29 DIAGNOSIS — K5732 Diverticulitis of large intestine without perforation or abscess without bleeding: Secondary | ICD-10-CM | POA: Diagnosis not present

## 2016-05-29 DIAGNOSIS — D125 Benign neoplasm of sigmoid colon: Secondary | ICD-10-CM

## 2016-05-29 DIAGNOSIS — R933 Abnormal findings on diagnostic imaging of other parts of digestive tract: Secondary | ICD-10-CM

## 2016-05-29 DIAGNOSIS — K635 Polyp of colon: Secondary | ICD-10-CM

## 2016-05-29 MED ORDER — SODIUM CHLORIDE 0.9 % IV SOLN: 500.0000 mL | INTRAVENOUS | Status: DC

## 2016-05-29 NOTE — Patient Instructions (Signed)
Handouts given on diverticulosis, polyps, hemorrhoids and high- fiber diet   YOU HAD AN ENDOSCOPIC PROCEDURE TODAY: Refer to the procedure report and other information in the discharge instructions given to you for any specific questions about what was found during the examination. If this information does not answer your questions, please call [place] office at [phone] to clarify.   YOU SHOULD EXPECT: Some feelings of bloating in the abdomen. Passage of more gas than usual. Walking can help get rid of the air that was put into your GI tract during the procedure and reduce the bloating. If you had a lower endoscopy (such as a colonoscopy or flexible sigmoidoscopy) you may notice spotting of blood in your stool or on the toilet paper. Some abdominal soreness may be present for a day or two, also.  DIET: Your first meal following the procedure should be a light meal and then it is ok to progress to your normal diet. A half-sandwich or bowl of soup is an example of a good first meal. Heavy or fried foods are harder to digest and may make you feel nauseous or bloated. Drink plenty of fluids but you should avoid alcoholic beverages for 24 hours. If you had a esophageal dilation, please see attached instructions for diet.    ACTIVITY: Your care partner should take you home directly after the procedure. You should plan to take it easy, moving slowly for the rest of the day. You can resume normal activity the day after the procedure however YOU SHOULD NOT DRIVE, use power tools, machinery or perform tasks that involve climbing or major physical exertion for 24 hours (because of the sedation medicines used during the test).   SYMPTOMS TO REPORT IMMEDIATELY: A gastroenterologist can be reached at any hour. Please call [phone]  for any of the following symptoms:  Following lower endoscopy (colonoscopy, flexible sigmoidoscopy) Excessive amounts of blood in the stool  Significant tenderness, worsening of  abdominal pains  Swelling of the abdomen that is new, acute  Fever of 100 or higher   FOLLOW UP:  If any biopsies were taken you will be contacted by phone or by letter within the next 1-3 weeks. Call [phone]  if you have not heard about the biopsies in 3 weeks.  Please also call with any specific questions about appointments or follow up tests.

## 2016-05-29 NOTE — Progress Notes (Signed)
To Pacu-Pt awake and alert  Report to RN 

## 2016-05-29 NOTE — Op Note (Signed)
[facility] Patient Name: Dakota Vang Procedure Date: 05/29/2016 1:24 PM MRN: [id number] Endoscopist: Ladene Artist , MD Age: 51 Referring MD:  Date of Birth: [birth year] Gender: Male Account #: [id number] Procedure:                Colonoscopy Indications:              Abnormal CT of the GI tract, Follow-up of                            diverticulitis. Personal history of adenomatous                            colon polyps, Family history of colon cancer-1st                            degree relative. Medicines:                Monitored Anesthesia Care Procedure:                Pre-Anesthesia Assessment:                           - Prior to the procedure, a History and Physical                            was performed, and patient medications and                            allergies were reviewed. The patient's tolerance of                            previous anesthesia was also reviewed. The risks                            and benefits of the procedure and the sedation                            options and risks were discussed with the patient.                            All questions were answered, and informed consent                            was obtained. Prior Anticoagulants: The patient has                            taken no previous anticoagulant or antiplatelet                            agents. ASA Grade Assessment: II - A patient with                            mild systemic disease. After reviewing the risks  and benefits, the patient was deemed in                            satisfactory condition to undergo the procedure.                           After obtaining informed consent, the colonoscope                            was passed under direct vision. Throughout the                            procedure, the patient's blood pressure, pulse, and                            oxygen saturations were monitored continuously. The                         Model PCF-H190DL [id number]) scope was introduced                            through the anus and advanced to the the cecum,                            identified by appendiceal orifice and ileocecal                            valve. The ileocecal valve, appendiceal orifice,                            and rectum were photographed. The quality of the                            bowel preparation was good. The colonoscopy was                            performed without difficulty. The patient tolerated                            the procedure well. Scope In: 1:37:30 PM Scope Out: 1:50:43 PM Scope Withdrawal Time: 0 hours 8 minutes 52 seconds  Total Procedure Duration: 0 hours 13 minutes 13 seconds  Findings:                 The perianal and digital rectal examinations were                            normal.                           A 5 mm polyp was found in the sigmoid colon. The                            polyp was sessile. The polyp was removed with a  cold biopsy forceps. Resection and retrieval were                            complete.                           Internal hemorrhoids were found during                            retroflexion. The hemorrhoids were small and Grade                            I (internal hemorrhoids that do not prolapse).                           Multiple medium-mouthed diverticula were found in                            the left colon. There was narrowing of the colon in                            association with the diverticular opening. There                            was evidence of diverticular spasm. There was no                            evidence of diverticular bleeding.                           Multiple medium-mouthed diverticula were found in                            the right colon. There was no evidence of                            diverticular bleeding.                           The exam  was otherwise without abnormality on                            direct and retroflexion views. Complications:            No immediate complications. Estimated blood loss:                            None. Estimated Blood Loss:     Estimated blood loss: none. Impression:               - One 5 mm polyp in the sigmoid colon, removed with                            a cold biopsy forceps. Resected and retrieved.                           -  Moderate diverticulosis in the left colon. There                            was narrowing of the colon in association with the                            diverticular opening. There was evidence of                            diverticular spasm. There was no evidence of                            diverticular bleeding.                           - Mild diverticulosis in the right colon. There was                            no evidence of diverticular bleeding. Recommendation:           - Repeat colonoscopy in 5 years for surveillance.                           - Patient has a contact number available for                            emergencies. The signs and symptoms of potential                            delayed complications were discussed with the                            patient. Return to normal activities tomorrow.                            Written discharge instructions were provided to the                            patient.                           - Continue present medications.                           - Await pathology results.                           - High fiber diet indefinitely. At least 8 glasses                            of water daily Ladene Artist, MD 05/29/2016 1:56:41 PM This report has been signed electronically.

## 2016-05-29 NOTE — Progress Notes (Signed)
Called to room to assist during endoscopic procedure.  Patient ID and intended procedure confirmed with present staff. Received instructions for my participation in the procedure from the performing physician.  

## 2016-05-30 ENCOUNTER — Telehealth: Payer: Self-pay

## 2016-05-30 ENCOUNTER — Telehealth: Payer: Self-pay | Admitting: *Deleted

## 2016-05-30 NOTE — Telephone Encounter (Signed)
  Follow up Call-  Call back number 05/29/2016  Post procedure Call Back phone  # [phone]  Permission to leave phone message Yes  Some recent data might be hidden   no answer, busy

## 2016-05-30 NOTE — Telephone Encounter (Signed)
  Follow up Call-  Call back number 05/29/2016  Post procedure Call Back phone  # [phone]  Permission to leave phone message Yes  Some recent data might be hidden     Phone busy, no message left

## 2016-06-03 ENCOUNTER — Ambulatory Visit (INDEPENDENT_AMBULATORY_CARE_PROVIDER_SITE_OTHER): Payer: Commercial Managed Care - HMO | Admitting: Psychiatry

## 2016-06-03 ENCOUNTER — Encounter (HOSPITAL_COMMUNITY): Payer: Self-pay | Admitting: Psychiatry

## 2016-06-03 VITALS — BP 142/74 | HR 94 | Ht 73.0 in | Wt 231.0 lb

## 2016-06-03 DIAGNOSIS — F17210 Nicotine dependence, cigarettes, uncomplicated: Secondary | ICD-10-CM | POA: Diagnosis not present

## 2016-06-03 DIAGNOSIS — Z880 Allergy status to penicillin: Secondary | ICD-10-CM | POA: Diagnosis not present

## 2016-06-03 DIAGNOSIS — F900 Attention-deficit hyperactivity disorder, predominantly inattentive type: Secondary | ICD-10-CM | POA: Diagnosis not present

## 2016-06-03 DIAGNOSIS — F330 Major depressive disorder, recurrent, mild: Secondary | ICD-10-CM | POA: Diagnosis not present

## 2016-06-03 DIAGNOSIS — Z79899 Other long term (current) drug therapy: Secondary | ICD-10-CM

## 2016-06-03 MED ORDER — LAMOTRIGINE 150 MG PO TABS: 150.0000 mg | ORAL_TABLET | Freq: Two times a day (BID) | ORAL | 1 refills | Status: DC

## 2016-06-03 MED ORDER — BUPROPION HCL ER (XL) 300 MG PO TB24: 300.0000 mg | ORAL_TABLET | Freq: Every day | ORAL | 1 refills | Status: DC

## 2016-06-03 MED ORDER — METHYLPHENIDATE HCL 20 MG PO TABS: 20.0000 mg | ORAL_TABLET | Freq: Three times a day (TID) | ORAL | 0 refills | Status: DC

## 2016-06-03 NOTE — Progress Notes (Signed)
[name] MD/PA/NP OP Progress Note  06/03/2016 12:07 PM Dakota Vang  MRN:  [id number]  Chief Complaint:  Chief Complaint    Follow-up     Subjective:  I don't thing medicine helping me.  I'm getting more irritable.  I have no energy and sleeping too much.  HPI: Dakota Vang came for his follow-up appointment.  He has noticed increased irritability, fatigue, lack of energy, increased sleep and getting easily frustrated.  He has multiple stressors.  He is been hospitalized a few times due to his diverticulosis.  He is taking multiple medication and recently finished and about it.  He admitted job is stressful and at home he is not handling better with his son.  He endorses son is not doing well.  He has a lot of issues at home between him and his wife.  He is taking Lexapro, Lamictal and Ritalin.  He had gained weight and he sleeping too much and he has no energy.  He like to go back on Wellbutrin.  He denies any suicidal thoughts or homicidal thought but he noticed easily irritable, frustrated and having anger issues.  He do not recall these issues with the Wellbutrin.  Patient denies drinking alcohol or using any illegal substances.  His appetite is okay.  His energy level is low.  He has no rash, itching from the Lamictal.  Visit Diagnosis:    ICD-9-CM ICD-10-CM   1. Major depressive disorder, recurrent episode, mild (HCC) 296.31 F33.0 lamoTRIgine (LAMICTAL) 150 MG tablet     buPROPion (WELLBUTRIN XL) 300 MG 24 hr tablet  2. Attention deficit hyperactivity disorder (ADHD), predominantly inattentive type 314.00 F90.0 methylphenidate (RITALIN) 20 MG tablet    Past Psychiatric History: Reviewed. Patient has taken Wellbutrin in the past with good response.  He also had tried Prozac for many years prescribed by primary care physician.  We have tried him on Cymbalta and recently Lexapro.  Patient denies any history of psychiatric inpatient treatment.  Past Medical History:  Past Medical History:   Diagnosis Date  . ADD (attention deficit disorder with hyperactivity)    Dr. Donalee Citrin  . Anxiety    Dr. Donalee Citrin  . Colon polyps 2008   Dr Fuller Plan  . COLONIC POLYPS, HX OF 11/20/2007   Qualifier: Diagnosis of  By: Linna Darner MD, Gwyndolyn Saxon   Last colonoscopy negative 2013 PMH of plyps X 2; Dr Fuller Plan, GI    . Diverticulitis    PMH of  . Diverticulitis of jejunum with microperforation s/p lap resection [id number] 08/28/2012   Qualifier: Diagnosis of  By: Linna Darner MD, Gwyndolyn Saxon   6/1-3/14 [facility] : ? Jejunal perforation F/U CT recommended 08/24/12   . Diverticulitis of sigmoid colon 06/24/2008   2005 by CT scan    . Diverticulosis 2002   Dr Velora Heckler  . Duodenal diverticulum 08/28/2012  . GERD (gastroesophageal reflux disease)   . Hyperlipidemia   . Hypertension   . Pancolonic diverticulosis 09/09/2012    Past Surgical History:  Procedure Laterality Date  . COLON SURGERY    . COLONOSCOPY  2013   Dr Fuller Plan  . COLONOSCOPY W/ POLYPECTOMY  2002 & 2008   Dr. Fuller Plan; polyps X 2  . LAPAROSCOPIC PARTIAL COLECTOMY N/A 09/24/2012   Procedure: laparoscopic assisted  resection of proximal jejunum containing the jejunal diverticulum;  Surgeon: [name], MD;  Location: [facility];  Service: General;  Laterality: N/A;  . LAPAROSCOPIC SMALL BOWEL RESECTION  09/24/2012   excision of proximal jejunum  for jejunal diverticulitis  . LASIK     bilaterally  . TONSILLECTOMY      Family Psychiatric History: Reviewed.  Family History:  Family History  Problem Relation Age of Onset  . Colon cancer Father     in 19s  . Lung cancer Father     smoker; asbestos exposure  . Cancer Father     Mesothelioma  . Coronary artery disease Mother     CAGB X 2 & angio 3-4X  . Cancer Mother     Gallbladder  . Coronary artery disease Maternal Grandmother   . COPD Maternal Grandmother   . Heart attack Maternal Grandfather     late 60's  . Heart attack Paternal Grandfather 21  . Diabetes Paternal Uncle      Dialysis  . Stroke Neg Hx     Social History:  Social History   Social History  . Marital status: Married    Spouse name: N/A  . Number of children: N/A  . Years of education: N/A   Social History Main Topics  . Smoking status: Current Some Day Smoker    Types: Cigarettes  . Smokeless tobacco: Never Used     Comment: smoked 1988-present ,up to 1 ppd. 08/22/11 averaging 2-3 /day  . Alcohol use No  . Drug use: No  . Sexual activity: Yes   Other Topics Concern  . None   Social History Narrative  . None    Allergies:  Allergies  Allergen Reactions  . Penicillins Other (See Comments)    Reaction:  Unknown  Has patient had a PCN reaction causing immediate rash, facial/tongue/throat swelling, SOB or lightheadedness with hypotension: Unsure Has patient had a PCN reaction causing severe rash involving mucus membranes or skin necrosis: Unsure Has patient had a PCN reaction that required hospitalization Unsure Has patient had a PCN reaction occurring within the last 10 years: No If all of the above answers are "NO", then may proceed with Cephalosporin use.    Metabolic Disorder Labs: Recent Results (from the past 2160 hour(s))  Basic metabolic panel     Status: Abnormal   Collection Time: 04/14/16  9:34 PM  Result Value Ref Range   Sodium 130 (L) 135 - 145 mmol/L   Potassium 3.8 3.5 - 5.1 mmol/L   Chloride 97 (L) 101 - 111 mmol/L   CO2 24 22 - 32 mmol/L   Glucose, Bld 109 (H) 65 - 99 mg/dL   BUN 12 6 - 20 mg/dL   Creatinine, Ser 1.03 0.61 - 1.24 mg/dL   Calcium 8.8 (L) 8.9 - 10.3 mg/dL   GFR calc non Af Amer >60 >60 mL/min   GFR calc Af Amer >60 >60 mL/min    Comment: (NOTE) The eGFR has been calculated using the CKD EPI equation. This calculation has not been validated in all clinical situations. eGFR's persistently <60 mL/min signify possible Chronic Kidney Disease.    Anion gap 9 5 - 15  CBC     Status: Abnormal   Collection Time: 04/14/16  9:34 PM  Result  Value Ref Range   WBC 17.2 (H) 4.0 - 10.5 K/uL   RBC 5.20 4.22 - 5.81 MIL/uL   Hemoglobin 15.5 13.0 - 17.0 g/dL   HCT 44.1 39.0 - 52.0 %   MCV 84.8 78.0 - 100.0 fL   MCH 29.8 26.0 - 34.0 pg   MCHC 35.1 30.0 - 36.0 g/dL   RDW 13.2 11.5 - 15.5 %   Platelets 179 150 -  400 K/uL  Hepatic function panel     Status: Abnormal   Collection Time: 04/14/16  9:34 PM  Result Value Ref Range   Total Protein 7.1 6.5 - 8.1 g/dL   Albumin 4.6 3.5 - 5.0 g/dL   AST 20 15 - 41 U/L   ALT 32 17 - 63 U/L   Alkaline Phosphatase 81 38 - 126 U/L   Total Bilirubin 0.2 (L) 0.3 - 1.2 mg/dL   Bilirubin, Direct 0.2 0.1 - 0.5 mg/dL   Indirect Bilirubin 0.0 (L) 0.3 - 0.9 mg/dL  Lipase, blood     Status: None   Collection Time: 04/14/16  9:34 PM  Result Value Ref Range   Lipase 28 11 - 51 U/L  I-stat troponin, ED     Status: None   Collection Time: 04/14/16  9:44 PM  Result Value Ref Range   Troponin i, poc 0.00 0.00 - 0.08 ng/mL   Comment 3            Comment: Due to the release kinetics of cTnI, a negative result within the first hours of the onset of symptoms does not rule out myocardial infarction with certainty. If myocardial infarction is still suspected, repeat the test at appropriate intervals.   Basic metabolic panel     Status: Abnormal   Collection Time: 04/15/16  4:47 AM  Result Value Ref Range   Sodium 138 135 - 145 mmol/L    Comment: DELTA CHECK NOTED REPEATED TO VERIFY    Potassium 4.3 3.5 - 5.1 mmol/L   Chloride 102 101 - 111 mmol/L   CO2 28 22 - 32 mmol/L   Glucose, Bld 106 (H) 65 - 99 mg/dL   BUN 13 6 - 20 mg/dL   Creatinine, Ser 1.06 0.61 - 1.24 mg/dL   Calcium 8.7 (L) 8.9 - 10.3 mg/dL   GFR calc non Af Amer >60 >60 mL/min   GFR calc Af Amer >60 >60 mL/min    Comment: (NOTE) The eGFR has been calculated using the CKD EPI equation. This calculation has not been validated in all clinical situations. eGFR's persistently <60 mL/min signify possible Chronic Kidney Disease.     Anion gap 8 5 - 15  CBC     Status: Abnormal   Collection Time: 04/15/16  4:47 AM  Result Value Ref Range   WBC 14.2 (H) 4.0 - 10.5 K/uL   RBC 4.78 4.22 - 5.81 MIL/uL   Hemoglobin 13.9 13.0 - 17.0 g/dL   HCT 41.2 39.0 - 52.0 %   MCV 86.2 78.0 - 100.0 fL   MCH 29.1 26.0 - 34.0 pg   MCHC 33.7 30.0 - 36.0 g/dL   RDW 13.2 11.5 - 15.5 %   Platelets 150 150 - 400 K/uL  CBC with Differential/Platelet     Status: Abnormal   Collection Time: 04/16/16  4:59 AM  Result Value Ref Range   WBC 12.8 (H) 4.0 - 10.5 K/uL   RBC 3.98 (L) 4.22 - 5.81 MIL/uL   Hemoglobin 11.8 (L) 13.0 - 17.0 g/dL   HCT 35.0 (L) 39.0 - 52.0 %   MCV 87.9 78.0 - 100.0 fL   MCH 29.6 26.0 - 34.0 pg   MCHC 33.7 30.0 - 36.0 g/dL   RDW 13.4 11.5 - 15.5 %   Platelets 136 (L) 150 - 400 K/uL   Neutrophils Relative % 77 %   Neutro Abs 9.8 (H) 1.7 - 7.7 K/uL   Lymphocytes Relative 14 %   Lymphs  Abs 1.8 0.7 - 4.0 K/uL   Monocytes Relative 9 %   Monocytes Absolute 1.1 (H) 0.1 - 1.0 K/uL   Eosinophils Relative 0 %   Eosinophils Absolute 0.1 0.0 - 0.7 K/uL   Basophils Relative 0 %   Basophils Absolute 0.0 0.0 - 0.1 K/uL  Basic metabolic panel     Status: Abnormal   Collection Time: 04/16/16  4:59 AM  Result Value Ref Range   Sodium 136 135 - 145 mmol/L   Potassium 4.0 3.5 - 5.1 mmol/L   Chloride 101 101 - 111 mmol/L   CO2 28 22 - 32 mmol/L   Glucose, Bld 139 (H) 65 - 99 mg/dL   BUN 8 6 - 20 mg/dL   Creatinine, Ser 0.96 0.61 - 1.24 mg/dL   Calcium 8.8 (L) 8.9 - 10.3 mg/dL   GFR calc non Af Amer >60 >60 mL/min   GFR calc Af Amer >60 >60 mL/min    Comment: (NOTE) The eGFR has been calculated using the CKD EPI equation. This calculation has not been validated in all clinical situations. eGFR's persistently <60 mL/min signify possible Chronic Kidney Disease.    Anion gap 7 5 - 15  CBC with Differential/Platelet     Status: Abnormal   Collection Time: 04/17/16  5:06 AM  Result Value Ref Range   WBC 11.0 (H) 4.0 - 10.5  K/uL   RBC 4.25 4.22 - 5.81 MIL/uL   Hemoglobin 12.3 (L) 13.0 - 17.0 g/dL   HCT 36.8 (L) 39.0 - 52.0 %   MCV 86.6 78.0 - 100.0 fL   MCH 28.9 26.0 - 34.0 pg   MCHC 33.4 30.0 - 36.0 g/dL   RDW 13.4 11.5 - 15.5 %   Platelets 145 (L) 150 - 400 K/uL   Neutrophils Relative % 70 %   Neutro Abs 7.7 1.7 - 7.7 K/uL   Lymphocytes Relative 19 %   Lymphs Abs 2.1 0.7 - 4.0 K/uL   Monocytes Relative 9 %   Monocytes Absolute 1.0 0.1 - 1.0 K/uL   Eosinophils Relative 2 %   Eosinophils Absolute 0.3 0.0 - 0.7 K/uL   Basophils Relative 0 %   Basophils Absolute 0.0 0.0 - 0.1 K/uL   Lab Results  Component Value Date   HGBA1C 5.7 08/26/2014   No results found for: PROLACTIN Lab Results  Component Value Date   CHOL 166 07/26/2015   TRIG 250.0 (H) 07/26/2015   HDL 30.80 (L) 07/26/2015   CHOLHDL 5 07/26/2015   VLDL 50.0 (H) 07/26/2015   LDLCALC 88 08/22/2014   LDLCALC 101 (H) 08/18/2013     Current Medications: Current Outpatient Prescriptions  Medication Sig Dispense Refill  . ALPRAZolam (XANAX) 1 MG tablet Take 1 mg by mouth daily as needed for anxiety.    Marland Kitchen amLODipine (NORVASC) 5 MG tablet Take 5 mg by mouth daily.    . clindamycin (CLEOCIN T) 1 % external solution Apply 1 application topically as needed (for breakouts on neck).     . lamoTRIgine (LAMICTAL) 150 MG tablet Take 1 tablet (150 mg total) by mouth 2 (two) times daily. 60 tablet 1  . methylphenidate (RITALIN) 20 MG tablet Take 1 tablet (20 mg total) by mouth 3 (three) times daily. 90 tablet 0  . omeprazole (PRILOSEC) 20 MG capsule Take 20 mg by mouth daily.    . rosuvastatin (CRESTOR) 10 MG tablet Take 1 tablet (10 mg total) by mouth daily. 90 tablet 1  . aspirin EC 81 MG  tablet Take 81 mg by mouth daily.     Marland Kitchen buPROPion (WELLBUTRIN XL) 300 MG 24 hr tablet Take 1 tablet (300 mg total) by mouth daily. 30 tablet 1  . fluticasone (FLONASE) 50 MCG/ACT nasal spray Place 2 sprays into both nostrils daily as needed for rhinitis.    Marland Kitchen  lisinopril (PRINIVIL,ZESTRIL) 30 MG tablet Take 1 tablet (30 mg total) by mouth daily. (Patient not taking: Reported on 06/03/2016) 30 tablet 6   No current facility-administered medications for this visit.     Neurologic: Headache: No Seizure: No Paresthesias: No  Musculoskeletal: Strength & Muscle Tone: within normal limits Gait & Station: normal Patient leans: N/A  Psychiatric Specialty Exam: Review of Systems  Constitutional: Negative.  Negative for weight loss.  Musculoskeletal: Negative.   Skin: Negative for itching and rash.  Psychiatric/Behavioral: Positive for depression. The patient is nervous/anxious.     Blood pressure (!) 142/74, pulse 94, height 6' 1"  (1.854 m), weight 231 lb (104.8 kg).Body mass index is 30.48 kg/m.  General Appearance: Casual  Eye Contact:  Good  Speech:  Clear and Coherent  Volume:  Normal  Mood:  Depressed and Dysphoric  Affect:  Constricted  Thought Process:  Goal Directed  Orientation:  Full (Time, Place, and Person)  Thought Content: Illogical and Rumination   Suicidal Thoughts:  No  Homicidal Thoughts:  No  Memory:  Immediate;   Fair Recent;   Good Remote;   Good  Judgement:  Good  Insight:  Good  Psychomotor Activity:  Decreased  Concentration:  Concentration: Fair and Attention Span: Fair  Recall:  [employer] of Knowledge: Good  Language: Good  Akathisia:  No  Handed:  Right  AIMS (if indicated):  0  Assets:  Communication Skills Desire for [place] Talents/Skills  ADL's:  Intact  Cognition: WNL  Sleep:  Too much     Assessment: Major depressive disorder, recurrent.  Attention deficit disorder  Plan: I review his symptoms, history, recent blood work results and collateral information from other provider.  Patient is experiencing more irritability, fatigue and lack of motivation.  He had gained weight and he sleeping too much.  I would discontinue Lexapro.  We  will try Wellbutrin which had helped him in the past.  Continue Lamictal 300 mg daily, Ritalin 20 mg in the morning, noon and afternoon.  We will start Wellbutrin XL 300 mg daily.  Discussed medication side effects and benefits.  Encouraged to watch his calorie intake and to regular exercise.  I will see him again in 4 weeks.  Discuss safety plan that anytime having active suicidal thoughts or homicidal thoughts and he need to call 911 or go to the local emergency room.  Patient has no rash, itching, tremors or shakes.  Shivank Pinedo T., MD 06/03/2016, 12:07 PM

## 2016-06-05 ENCOUNTER — Ambulatory Visit (HOSPITAL_COMMUNITY): Payer: Self-pay | Admitting: Psychiatry

## 2016-06-07 ENCOUNTER — Encounter: Payer: Self-pay | Admitting: Gastroenterology

## 2016-06-16 ENCOUNTER — Other Ambulatory Visit (HOSPITAL_COMMUNITY): Payer: Self-pay | Admitting: Psychiatry

## 2016-06-16 DIAGNOSIS — F330 Major depressive disorder, recurrent, mild: Secondary | ICD-10-CM

## 2016-06-17 ENCOUNTER — Ambulatory Visit (HOSPITAL_COMMUNITY): Payer: Self-pay | Admitting: Psychiatry

## 2016-06-20 ENCOUNTER — Other Ambulatory Visit (HOSPITAL_COMMUNITY): Payer: Self-pay | Admitting: Psychiatry

## 2016-06-20 NOTE — Telephone Encounter (Signed)
It was discontinued on his last appointment

## 2016-07-03 ENCOUNTER — Telehealth (HOSPITAL_COMMUNITY): Payer: Self-pay

## 2016-07-03 NOTE — Telephone Encounter (Signed)
Patient is calling for a refill on his Ritalin, he has a follow up on 5/8. Please review and advise, thank you

## 2016-07-04 ENCOUNTER — Other Ambulatory Visit (HOSPITAL_COMMUNITY): Payer: Self-pay | Admitting: Psychiatry

## 2016-07-04 DIAGNOSIS — F900 Attention-deficit hyperactivity disorder, predominantly inattentive type: Secondary | ICD-10-CM

## 2016-07-04 MED ORDER — METHYLPHENIDATE HCL 20 MG PO TABS: 20.0000 mg | ORAL_TABLET | Freq: Three times a day (TID) | ORAL | 0 refills | Status: DC

## 2016-07-04 NOTE — Telephone Encounter (Signed)
Dr. Adele Schilder printed rx and I attempted to call patient to let him know that it was ready for pick up. No answer and no voicemail

## 2016-07-17 ENCOUNTER — Ambulatory Visit (INDEPENDENT_AMBULATORY_CARE_PROVIDER_SITE_OTHER): Payer: Commercial Managed Care - HMO | Admitting: Psychiatry

## 2016-07-17 ENCOUNTER — Encounter (HOSPITAL_COMMUNITY): Payer: Self-pay | Admitting: Psychiatry

## 2016-07-17 VITALS — BP 126/80 | HR 96 | Ht 73.0 in | Wt 233.0 lb

## 2016-07-17 DIAGNOSIS — F330 Major depressive disorder, recurrent, mild: Secondary | ICD-10-CM

## 2016-07-17 DIAGNOSIS — Z79899 Other long term (current) drug therapy: Secondary | ICD-10-CM | POA: Diagnosis not present

## 2016-07-17 DIAGNOSIS — F900 Attention-deficit hyperactivity disorder, predominantly inattentive type: Secondary | ICD-10-CM

## 2016-07-17 DIAGNOSIS — F17210 Nicotine dependence, cigarettes, uncomplicated: Secondary | ICD-10-CM

## 2016-07-17 MED ORDER — BUPROPION HCL ER (XL) 300 MG PO TB24: 300.0000 mg | ORAL_TABLET | Freq: Every day | ORAL | 0 refills | Status: DC

## 2016-07-17 MED ORDER — METHYLPHENIDATE HCL 20 MG PO TABS: 20.0000 mg | ORAL_TABLET | Freq: Three times a day (TID) | ORAL | 0 refills | Status: DC

## 2016-07-17 MED ORDER — ARIPIPRAZOLE 5 MG PO TABS: ORAL_TABLET | ORAL | 0 refills | Status: DC

## 2016-07-17 MED ORDER — LAMOTRIGINE 150 MG PO TABS: 150.0000 mg | ORAL_TABLET | Freq: Two times a day (BID) | ORAL | 0 refills | Status: DC
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
## 2016-07-31 ENCOUNTER — Other Ambulatory Visit (HOSPITAL_COMMUNITY): Payer: Self-pay | Admitting: Psychiatry

## 2016-07-31 ENCOUNTER — Other Ambulatory Visit (HOSPITAL_COMMUNITY): Payer: Self-pay

## 2016-07-31 DIAGNOSIS — F330 Major depressive disorder, recurrent, mild: Secondary | ICD-10-CM

## 2016-07-31 MED ORDER — ARIPIPRAZOLE 5 MG PO TABS: ORAL_TABLET | ORAL | 0 refills | Status: DC

## 2016-08-19 ENCOUNTER — Encounter (HOSPITAL_COMMUNITY): Payer: Self-pay | Admitting: Psychiatry

## 2016-08-19 ENCOUNTER — Ambulatory Visit (INDEPENDENT_AMBULATORY_CARE_PROVIDER_SITE_OTHER): Payer: Commercial Managed Care - HMO | Admitting: Psychiatry

## 2016-08-19 DIAGNOSIS — F330 Major depressive disorder, recurrent, mild: Secondary | ICD-10-CM

## 2016-08-19 DIAGNOSIS — F900 Attention-deficit hyperactivity disorder, predominantly inattentive type: Secondary | ICD-10-CM | POA: Diagnosis not present

## 2016-08-19 DIAGNOSIS — F17210 Nicotine dependence, cigarettes, uncomplicated: Secondary | ICD-10-CM

## 2016-08-19 MED ORDER — METHYLPHENIDATE HCL 20 MG PO TABS: 20.0000 mg | ORAL_TABLET | Freq: Four times a day (QID) | ORAL | 0 refills | Status: DC

## 2016-08-19 MED ORDER — BUPROPION HCL ER (XL) 300 MG PO TB24: 300.0000 mg | ORAL_TABLET | Freq: Every day | ORAL | 1 refills | Status: DC

## 2016-08-19 MED ORDER — ARIPIPRAZOLE 10 MG PO TABS: 10.0000 mg | ORAL_TABLET | Freq: Every day | ORAL | 1 refills | Status: DC

## 2016-08-19 MED ORDER — LAMOTRIGINE 150 MG PO TABS: 150.0000 mg | ORAL_TABLET | Freq: Two times a day (BID) | ORAL | 1 refills | Status: DC

## 2016-08-19 NOTE — Progress Notes (Signed)
[facility] MD/PA/NP OP Progress Note  08/19/2016 10:47 AM Dakota Vang  MRN:  [id number]  Chief Complaint:  Subjective:  I like Abilify.  However I still sleeping too much.  HPI: Dakota Vang came for his follow-up appointment.  On his last visit we started Abilify and he is taking 5 mg.  He has seen much improvement in his mood irritability anger and depression.  He is happy that finally his son graduated but he is not sure what he will do in the future.  He mention things at home is much better now.  Him and his wife handing situation much better.  He continued to endorse too much sleep and feel current Ritalin dose not helping.  He is concerned because some time he is unable to do multitasking and his attention and concentration remains poor.  However he is able to function at work.  He is working 60-70 hours a week.  Patient denies drinking alcohol or using any illegal substances.  Since taking the Abilify he is not as upset but like to go up the dose.  He has no tremors, shakes or any EPS.  He denies mania or any psychosis.  He really want to increase his Ritalin despite taking 60 mg a day.  He denies any suicidal thoughts or homicidal thoughts.  Today his blood pressure is slightly increased because he missed taking his blood pressure medication.  He has no chest pain, sweating, headaches, dizziness.  Visit Diagnosis:    ICD-10-CM   1. Attention deficit hyperactivity disorder (ADHD), predominantly inattentive type F90.0 methylphenidate (RITALIN) 20 MG tablet    DISCONTINUED: methylphenidate (RITALIN) 20 MG tablet  2. Major depressive disorder, recurrent episode, mild (HCC) F33.0 lamoTRIgine (LAMICTAL) 150 MG tablet    buPROPion (WELLBUTRIN XL) 300 MG 24 hr tablet    ARIPiprazole (ABILIFY) 10 MG tablet    Past Psychiatric History: Reviewed. Patient has taken Wellbutrin in the past with good response. He also had tried Prozac for many years prescribed by primary care physician. We have tried him on  Cymbalta and Lexapro but it did not helped. Patient denies any history of psychiatric inpatient treatment.  Past Medical History:  Past Medical History:  Diagnosis Date  . ADD (attention deficit disorder with hyperactivity)    Dr. Donalee Citrin  . Anxiety    Dr. Donalee Citrin  . Colon polyps 2008   Dr Fuller Plan  . COLONIC POLYPS, HX OF 11/20/2007   Qualifier: Diagnosis of  By: Linna Darner MD, Gwyndolyn Saxon   Last colonoscopy negative 2013 PMH of plyps X 2; Dr Fuller Plan, GI    . Diverticulitis    PMH of  . Diverticulitis of jejunum with microperforation s/p lap resection [id number] 08/28/2012   Qualifier: Diagnosis of  By: Linna Darner MD, Gwyndolyn Saxon   6/1-3/14 [facility] ,[place] : ? Jejunal perforation F/U CT recommended 08/24/12   . Diverticulitis of sigmoid colon 06/24/2008   2005 by CT scan    . Diverticulosis 2002   Dr Velora Heckler  . Duodenal diverticulum 08/28/2012  . GERD (gastroesophageal reflux disease)   . Hyperlipidemia   . Hypertension   . Pancolonic diverticulosis 09/09/2012    Past Surgical History:  Procedure Laterality Date  . COLON SURGERY    . COLONOSCOPY  2013   Dr Fuller Plan  . COLONOSCOPY W/ POLYPECTOMY  2002 & 2008   Dr. Fuller Plan; polyps X 2  . LAPAROSCOPIC PARTIAL COLECTOMY N/A 09/24/2012   Procedure: laparoscopic assisted  resection of proximal jejunum containing the jejunal diverticulum;  Surgeon: [name], MD;  Location: [facility];  Service: General;  Laterality: N/A;  . LAPAROSCOPIC SMALL BOWEL RESECTION  09/24/2012   excision of proximal jejunum for jejunal diverticulitis  . LASIK     bilaterally  . TONSILLECTOMY      Family Psychiatric History: Reviewed.  Family History:  Family History  Problem Relation Age of Onset  . Colon cancer Father        in 51s  . Lung cancer Father        smoker; asbestos exposure  . Cancer Father        Mesothelioma  . Coronary artery disease Mother        CAGB X 2 & angio 3-4X  . Cancer Mother        Gallbladder  . Coronary artery disease  Maternal Grandmother   . COPD Maternal Grandmother   . Heart attack Maternal Grandfather        late 60's  . Heart attack Paternal Grandfather 59  . Diabetes Paternal Uncle        Dialysis  . Stroke Neg Hx     Social History:  Social History   Social History  . Marital status: Married    Spouse name: N/A  . Number of children: N/A  . Years of education: N/A   Social History Main Topics  . Smoking status: Current Some Day Smoker    Packs/day: 0.25    Types: Cigarettes  . Smokeless tobacco: Never Used     Comment: smoked 1988-present ,up to 1 ppd. 08/22/11 averaging 2-3 /day  . Alcohol use No  . Drug use: No  . Sexual activity: Yes   Other Topics Concern  . None   Social History Narrative  . None    Allergies:  Allergies  Allergen Reactions  . Penicillins Other (See Comments)    Reaction:  Unknown  Has patient had a PCN reaction causing immediate rash, facial/tongue/throat swelling, SOB or lightheadedness with hypotension: Unsure Has patient had a PCN reaction causing severe rash involving mucus membranes or skin necrosis: Unsure Has patient had a PCN reaction that required hospitalization Unsure Has patient had a PCN reaction occurring within the last 10 years: No If all of the above answers are "NO", then may proceed with Cephalosporin use.    Metabolic Disorder Labs: Lab Results  Component Value Date   HGBA1C 5.7 08/26/2014   No results found for: PROLACTIN Lab Results  Component Value Date   CHOL 166 07/26/2015   TRIG 250.0 (H) 07/26/2015   HDL 30.80 (L) 07/26/2015   CHOLHDL 5 07/26/2015   VLDL 50.0 (H) 07/26/2015   LDLCALC 88 08/22/2014   LDLCALC 101 (H) 08/18/2013     Current Medications: Current Outpatient Prescriptions  Medication Sig Dispense Refill  . ALPRAZolam (XANAX) 1 MG tablet Take 1 mg by mouth daily as needed for anxiety.    Marland Kitchen amLODipine (NORVASC) 5 MG tablet Take 5 mg by mouth daily.    . ARIPiprazole (ABILIFY) 5 MG tablet Take 1/2  to 1 tab daily 30 tablet 0  . aspirin EC 81 MG tablet Take 81 mg by mouth daily.     Marland Kitchen buPROPion (WELLBUTRIN XL) 300 MG 24 hr tablet Take 1 tablet (300 mg total) by mouth daily. 30 tablet 0  . clindamycin (CLEOCIN T) 1 % external solution Apply 1 application topically as needed (for breakouts on neck).     . fluticasone (FLONASE) 50 MCG/ACT nasal spray Place 2 sprays  into both nostrils daily as needed for rhinitis.    Marland Kitchen lamoTRIgine (LAMICTAL) 150 MG tablet Take 1 tablet (150 mg total) by mouth 2 (two) times daily. 60 tablet 0  . methylphenidate (RITALIN) 20 MG tablet Take 1 tablet (20 mg total) by mouth 3 (three) times daily. 90 tablet 0  . omeprazole (PRILOSEC) 20 MG capsule Take 20 mg by mouth daily.    . ramipril (ALTACE) 10 MG capsule Take 10 mg by mouth daily.    . rosuvastatin (CRESTOR) 10 MG tablet Take 1 tablet (10 mg total) by mouth daily. 90 tablet 1   No current facility-administered medications for this visit.     Neurologic: Headache: No Seizure: No Paresthesias: No  Musculoskeletal: Strength & Muscle Tone: within normal limits Gait & Station: normal Patient leans: N/A  Psychiatric Specialty Exam: Review of Systems  Constitutional: Negative.   HENT: Negative.   Respiratory: Negative.  Negative for shortness of breath.   Cardiovascular: Negative.  Negative for chest pain.  Gastrointestinal: Negative.   Musculoskeletal: Negative.   Skin: Negative for itching and rash.  Neurological: Negative.  Negative for tremors.  Psychiatric/Behavioral:       Hypersomnia    Blood pressure (!) 140/98, pulse 96, height 6\' 1"  (1.854 m), weight 234 lb (106.1 kg).Body mass index is 30.87 kg/m.  General Appearance: Casual  Eye Contact:  Good  Speech:  Clear and Coherent  Volume:  Normal  Mood:  Euthymic  Affect:  Congruent  Thought Process:  Goal Directed  Orientation:  Full (Time, Place, and Person)  Thought Content: WDL and Logical   Suicidal Thoughts:  No  Homicidal  Thoughts:  No  Memory:  Immediate;   Fair Recent;   Good Remote;   Good  Judgement:  Good  Insight:  Good  Psychomotor Activity:  Normal  Concentration:  Concentration: Fair and Attention Span: Fair  Recall:  [employer] of Knowledge: Good  Language: Good  Akathisia:  No  Handed:  Right  AIMS (if indicated):  0  Assets:  Communication Skills Desire for Improvement Housing Resilience Talents/Skills Transportation  ADL's:  Intact  Cognition: WNL  Sleep:  Too much     Assessment: Major depressive disorder, recurrent.  Attention deficit disorder inattentive type  Plan: I review his psychosocial stressors, current medication.  Patient reported that Abilify helping but like to go up on the dose.  He has no tremors or shakes.  We will increase Abilify 10 mg however recommended if he started to have tremors shakes or EPS that he needed to cut down to 5 mg.  He also insists that he like to try increase Ritalin to help his focus and hypersomnia.  We explained that increase Ritalin may cause increased blood pressure but he promised that he will continue his blood pressure medication and he apologizes not taking this morning.  I will increase Ritalin 20 mg 4 times a day however if he does not see any improvement then we will consider switching him to Vyvanse.  He like to continue Wellbutrin and Lamictal at present dose.  He has no rash, itching or any other concern.  He is working with his wife and even thinking about family counseling but overall he believed his family situation is much better since son graduated.  Discuss safety concern that anytime having active suicidal thoughts or homicidal thoughts and he need to call 911 or go to the local emergency room.  Follow-up in 2 months. Time spent 25 minutes.  More than 50% of the time spent in psychoeducation, counseling and coordination of care.  Discuss safety plan that anytime having active suicidal thoughts or homicidal thoughts then patient need  to call 911 or go to the local emergency room.    Margert Edsall T., MD 08/19/2016, 10:47 AM

## 2016-09-20 ENCOUNTER — Other Ambulatory Visit: Payer: Self-pay | Admitting: Family Medicine

## 2016-09-20 DIAGNOSIS — E785 Hyperlipidemia, unspecified: Secondary | ICD-10-CM

## 2016-09-23 ENCOUNTER — Other Ambulatory Visit: Payer: Self-pay | Admitting: Family Medicine

## 2016-09-23 DIAGNOSIS — E785 Hyperlipidemia, unspecified: Secondary | ICD-10-CM

## 2016-10-14 ENCOUNTER — Encounter (HOSPITAL_COMMUNITY): Payer: Self-pay | Admitting: Psychiatry

## 2016-10-14 ENCOUNTER — Ambulatory Visit (INDEPENDENT_AMBULATORY_CARE_PROVIDER_SITE_OTHER): Payer: Commercial Managed Care - HMO | Admitting: Psychiatry

## 2016-10-14 ENCOUNTER — Ambulatory Visit (HOSPITAL_COMMUNITY): Payer: Commercial Managed Care - HMO | Admitting: Psychiatry

## 2016-10-14 DIAGNOSIS — F330 Major depressive disorder, recurrent, mild: Secondary | ICD-10-CM

## 2016-10-14 DIAGNOSIS — F900 Attention-deficit hyperactivity disorder, predominantly inattentive type: Secondary | ICD-10-CM | POA: Diagnosis not present

## 2016-10-14 DIAGNOSIS — F17210 Nicotine dependence, cigarettes, uncomplicated: Secondary | ICD-10-CM

## 2016-10-14 MED ORDER — METHYLPHENIDATE HCL 20 MG PO TABS: 20.0000 mg | ORAL_TABLET | Freq: Four times a day (QID) | ORAL | 0 refills | Status: DC

## 2016-10-14 MED ORDER — LAMOTRIGINE 150 MG PO TABS: 150.0000 mg | ORAL_TABLET | Freq: Two times a day (BID) | ORAL | 0 refills | Status: DC

## 2016-10-14 MED ORDER — BUPROPION HCL ER (XL) 300 MG PO TB24: 300.0000 mg | ORAL_TABLET | Freq: Every day | ORAL | 0 refills | Status: DC

## 2016-10-14 MED ORDER — ARIPIPRAZOLE 10 MG PO TABS: 10.0000 mg | ORAL_TABLET | Freq: Every day | ORAL | 0 refills | Status: DC

## 2016-10-14 NOTE — Progress Notes (Signed)
[place] MD/PA/NP OP Progress Note  10/14/2016 2:01 PM Dakota Vang  MRN:  [id number]  Chief Complaint:  Subjective:  I'm doing better with the help of Abilify and Ritalin.  My focus attention is improved.  I'm not irritable.  HPI: Dakota Vang came for his follow-up appointment.  On his last visit we increase Abilify and recommended to try Ritalin 20 mg 4 times a day.  He is doing much better.  His attention, focus and multitasking is improved.  He is not stressful or agitated.  He is also more relaxed and calm.  He is not sleeping too much.  He feels he is more productive.  His blood pressure is also well controlled.  He denies any tremors, shakes or any EPS.  He still not able to find a primary care physician.  He is getting medication from urgent care.  He is going almost every weekend to [place] with his family and that is helping him a lot.  He is also compliant with Wellbutrin and Lamictal.  He has no rash, itching, tremors.  His energy level is good.  His appetite is okay.  His vital signs are stable.  Visit Diagnosis:    ICD-10-CM   1. Attention deficit hyperactivity disorder (ADHD), predominantly inattentive type F90.0 methylphenidate (RITALIN) 20 MG tablet  2. Major depressive disorder, recurrent episode, mild (HCC) F33.0 lamoTRIgine (LAMICTAL) 150 MG tablet    ARIPiprazole (ABILIFY) 10 MG tablet    buPROPion (WELLBUTRIN XL) 300 MG 24 hr tablet    Past Psychiatric History: Reviewed. Patient has taken Wellbutrin in the past with good response. He also had tried Prozac for many years prescribed by primary care physician. We have tried him on Cymbalta and Lexapro but it did not helped. Patient denies any history of psychiatric inpatient treatment.  Past Medical History:  Past Medical History:  Diagnosis Date  . ADD (attention deficit disorder with hyperactivity)    Dr. Donalee Citrin  . Anxiety    Dr. Donalee Citrin  . Colon polyps 2008   Dr Fuller Plan  . COLONIC POLYPS, HX OF 11/20/2007   Qualifier:  Diagnosis of  By: Linna Darner MD, Gwyndolyn Saxon   Last colonoscopy negative 2013 PMH of plyps X 2; Dr Fuller Plan, GI    . Diverticulitis    PMH of  . Diverticulitis of jejunum with microperforation s/p lap resection [id number] 08/28/2012   Qualifier: Diagnosis of  By: Linna Darner MD, Gwyndolyn Saxon   6/1-3/14 [facility] : ? Jejunal perforation F/U CT recommended 08/24/12   . Diverticulitis of sigmoid colon 06/24/2008   2005 by CT scan    . Diverticulosis 2002   Dr Velora Heckler  . Duodenal diverticulum 08/28/2012  . GERD (gastroesophageal reflux disease)   . Hyperlipidemia   . Hypertension   . Pancolonic diverticulosis 09/09/2012    Past Surgical History:  Procedure Laterality Date  . COLON SURGERY    . COLONOSCOPY  2013   Dr Fuller Plan  . COLONOSCOPY W/ POLYPECTOMY  2002 & 2008   Dr. Fuller Plan; polyps X 2  . LAPAROSCOPIC PARTIAL COLECTOMY N/A 09/24/2012   Procedure: laparoscopic assisted  resection of proximal jejunum containing the jejunal diverticulum;  Surgeon: [name], MD;  Location: [facility];  Service: General;  Laterality: N/A;  . LAPAROSCOPIC SMALL BOWEL RESECTION  09/24/2012   excision of proximal jejunum for jejunal diverticulitis  . LASIK     bilaterally  . TONSILLECTOMY      Family Psychiatric History: Reviewed.  Family History:  Family History  Problem Relation Age of Onset  . Colon cancer Father        in 85s  . Lung cancer Father        smoker; asbestos exposure  . Cancer Father        Mesothelioma  . Coronary artery disease Mother        CAGB X 2 & angio 3-4X  . Cancer Mother        Gallbladder  . Coronary artery disease Maternal Grandmother   . COPD Maternal Grandmother   . Heart attack Maternal Grandfather        late 60's  . Heart attack Paternal Grandfather 66  . Diabetes Paternal Uncle        Dialysis  . Stroke Neg Hx     Social History:  Social History   Social History  . Marital status: Married    Spouse name: N/A  . Number of children: N/A  . Years  of education: N/A   Social History Main Topics  . Smoking status: Current Some Day Smoker    Packs/day: 0.25    Types: Cigarettes  . Smokeless tobacco: Never Used     Comment: smoked 1988-present ,up to 1 ppd. 08/22/11 averaging 2-3 /day  . Alcohol use No  . Drug use: No  . Sexual activity: Yes   Other Topics Concern  . None   Social History Narrative  . None    Allergies:  Allergies  Allergen Reactions  . Penicillins Other (See Comments)    Reaction:  Unknown  Has patient had a PCN reaction causing immediate rash, facial/tongue/throat swelling, SOB or lightheadedness with hypotension: Unsure Has patient had a PCN reaction causing severe rash involving mucus membranes or skin necrosis: Unsure Has patient had a PCN reaction that required hospitalization Unsure Has patient had a PCN reaction occurring within the last 10 years: No If all of the above answers are "NO", then may proceed with Cephalosporin use.    Metabolic Disorder Labs: Lab Results  Component Value Date   HGBA1C 5.7 08/26/2014   No results found for: PROLACTIN Lab Results  Component Value Date   CHOL 166 07/26/2015   TRIG 250.0 (H) 07/26/2015   HDL 30.80 (L) 07/26/2015   CHOLHDL 5 07/26/2015   VLDL 50.0 (H) 07/26/2015   LDLCALC 88 08/22/2014   LDLCALC 101 (H) 08/18/2013     Current Medications: Current Outpatient Prescriptions  Medication Sig Dispense Refill  . ALPRAZolam (XANAX) 1 MG tablet Take 1 mg by mouth daily as needed for anxiety.    Marland Kitchen amLODipine (NORVASC) 5 MG tablet Take 5 mg by mouth daily.    . ARIPiprazole (ABILIFY) 10 MG tablet Take 1 tablet (10 mg total) by mouth daily. 30 tablet 1  . aspirin EC 81 MG tablet Take 81 mg by mouth daily.     Marland Kitchen buPROPion (WELLBUTRIN XL) 300 MG 24 hr tablet Take 1 tablet (300 mg total) by mouth daily. 30 tablet 1  . clindamycin (CLEOCIN T) 1 % external solution Apply 1 application topically as needed (for breakouts on neck).     . fluticasone (FLONASE) 50  MCG/ACT nasal spray Place 2 sprays into both nostrils daily as needed for rhinitis.    Marland Kitchen lamoTRIgine (LAMICTAL) 150 MG tablet Take 1 tablet (150 mg total) by mouth 2 (two) times daily. 60 tablet 1  . methylphenidate (RITALIN) 20 MG tablet Take 1 tablet (20 mg total) by mouth 4 (four) times daily. 120 tablet 0  .  omeprazole (PRILOSEC) 20 MG capsule Take 20 mg by mouth daily.    . ramipril (ALTACE) 10 MG capsule Take 10 mg by mouth daily.    . rosuvastatin (CRESTOR) 10 MG tablet Take 1 tablet (10 mg total) by mouth daily. 90 tablet 1   No current facility-administered medications for this visit.     Neurologic: Headache: No Seizure: No Paresthesias: No  Musculoskeletal: Strength & Muscle Tone: within normal limits Gait & Station: normal Patient leans: N/A  Psychiatric Specialty Exam: ROS  Blood pressure 132/78, pulse 94, height 6\' 1"  (1.854 m), weight 237 lb 9.6 oz (107.8 kg).Body mass index is 31.35 kg/m.  General Appearance: Casual  Eye Contact:  Good  Speech:  Clear and Coherent  Volume:  Normal  Mood:  Euthymic  Affect:  Congruent  Thought Process:  Goal Directed  Orientation:  Full (Time, Place, and Person)  Thought Content: Logical   Suicidal Thoughts:  No  Homicidal Thoughts:  No  Memory:  Immediate;   Good Recent;   Good Remote;   Good  Judgement:  Good  Insight:  Good  Psychomotor Activity:  Normal  Concentration:  Concentration: Good and Attention Span: Good  Recall:  Good  Fund of Knowledge: Good  Language: Good  Akathisia:  No  Handed:  Right  AIMS (if indicated):  0  Assets:  Communication Skills Desire for Improvement Housing Resilience Social Support Talents/Skills  ADL's:  Intact  Cognition: WNL  Sleep:  Normal     Assessment: Major depressive disorder, recurrent.  Attention deficit disorder, inattentive type  Plan: Patient doing much better with the medication adjustment.  He has no tremors or shakes.  He is looking for a primary care  physician and I have provided contact information for Dr. Lorenda Hatchet at [facility].  I will continue Abilify 10 mg daily, Ritalin 20 mg 4 times a day, Wellbutrin XL 300 mg daily and Lamictal 300 mg daily.  His blood pressure is a stable.  He has no rash, itching, tremors or shakes.  Recommended to call us back if he has any question, concern or if he feel worsening of the symptom.  Follow-up in 3 months.  Patient named a 90 day prescription for his psychiatric medication except Ritalin.  Dafna Romo T., MD 10/14/2016, 2:01 PM

## 2016-10-23 DIAGNOSIS — Z0000 Encounter for general adult medical examination without abnormal findings: Secondary | ICD-10-CM | POA: Diagnosis not present

## 2016-10-23 DIAGNOSIS — R9431 Abnormal electrocardiogram [ECG] [EKG]: Secondary | ICD-10-CM | POA: Diagnosis not present

## 2016-10-23 DIAGNOSIS — K5792 Diverticulitis of intestine, part unspecified, without perforation or abscess without bleeding: Secondary | ICD-10-CM | POA: Diagnosis not present

## 2016-11-12 ENCOUNTER — Telehealth (HOSPITAL_COMMUNITY): Payer: Self-pay

## 2016-11-12 DIAGNOSIS — F900 Attention-deficit hyperactivity disorder, predominantly inattentive type: Secondary | ICD-10-CM

## 2016-11-12 MED ORDER — METHYLPHENIDATE HCL 20 MG PO TABS: 20.0000 mg | ORAL_TABLET | Freq: Four times a day (QID) | ORAL | 0 refills | Status: DC

## 2016-11-12 NOTE — Telephone Encounter (Signed)
Printed per Dr. Adele Schilder and called patient to come and pick up

## 2016-11-12 NOTE — Telephone Encounter (Signed)
Patient calling for a refill on Ritalin, he does not follow up until November. Please review and advise, thank you

## 2016-11-13 ENCOUNTER — Telehealth (HOSPITAL_COMMUNITY): Payer: Self-pay | Admitting: Psychiatry

## 2016-11-13 NOTE — Telephone Encounter (Signed)
Shahil picked up prescription on 08/11/12 lic [id number] dlh

## 2016-12-09 ENCOUNTER — Telehealth (HOSPITAL_COMMUNITY): Payer: Self-pay

## 2016-12-09 ENCOUNTER — Telehealth (HOSPITAL_COMMUNITY): Payer: Self-pay | Admitting: Psychiatry

## 2016-12-09 ENCOUNTER — Other Ambulatory Visit (HOSPITAL_COMMUNITY): Payer: Self-pay | Admitting: Psychiatry

## 2016-12-09 DIAGNOSIS — F900 Attention-deficit hyperactivity disorder, predominantly inattentive type: Secondary | ICD-10-CM

## 2016-12-09 MED ORDER — METHYLPHENIDATE HCL 20 MG PO TABS: 20.0000 mg | ORAL_TABLET | Freq: Four times a day (QID) | ORAL | 0 refills | Status: DC

## 2016-12-09 NOTE — Telephone Encounter (Signed)
Patient arrived to pick up Rx. Identification verified. DL#: [id number]

## 2016-12-09 NOTE — Telephone Encounter (Signed)
Patient is calling for a refill on his Ritalin last filled on 11/12/2016 - patient has a follow up on 01/22/2017. Okay to refill? Please review and advise, thank you

## 2016-12-21 ENCOUNTER — Other Ambulatory Visit (HOSPITAL_COMMUNITY): Payer: Self-pay | Admitting: Psychiatry

## 2016-12-21 DIAGNOSIS — F330 Major depressive disorder, recurrent, mild: Secondary | ICD-10-CM

## 2016-12-22 ENCOUNTER — Other Ambulatory Visit (HOSPITAL_COMMUNITY): Payer: Self-pay | Admitting: Psychiatry

## 2016-12-22 DIAGNOSIS — F330 Major depressive disorder, recurrent, mild: Secondary | ICD-10-CM

## 2016-12-31 ENCOUNTER — Other Ambulatory Visit (HOSPITAL_COMMUNITY): Payer: Self-pay | Admitting: Psychiatry

## 2016-12-31 DIAGNOSIS — F330 Major depressive disorder, recurrent, mild: Secondary | ICD-10-CM

## 2017-01-06 ENCOUNTER — Telehealth (HOSPITAL_COMMUNITY): Payer: Self-pay

## 2017-01-06 NOTE — Telephone Encounter (Signed)
Patient comes to see you in mid November, but will be out of his Methylphenidate by this weekend. He would like a refill, please review and advise, thank you

## 2017-01-07 ENCOUNTER — Other Ambulatory Visit (HOSPITAL_COMMUNITY): Payer: Self-pay

## 2017-01-07 ENCOUNTER — Other Ambulatory Visit (HOSPITAL_COMMUNITY): Payer: Self-pay | Admitting: Psychiatry

## 2017-01-07 DIAGNOSIS — F900 Attention-deficit hyperactivity disorder, predominantly inattentive type: Secondary | ICD-10-CM

## 2017-01-07 MED ORDER — METHYLPHENIDATE HCL 20 MG PO TABS: 20.0000 mg | ORAL_TABLET | Freq: Four times a day (QID) | ORAL | 0 refills | Status: DC

## 2017-01-08 ENCOUNTER — Telehealth (HOSPITAL_COMMUNITY): Payer: Self-pay | Admitting: Psychiatry

## 2017-01-08 NOTE — Telephone Encounter (Signed)
Khris picked up prescription on 91/50/56 lic [id number] dlh

## 2017-01-15 ENCOUNTER — Ambulatory Visit (HOSPITAL_COMMUNITY): Payer: Self-pay | Admitting: Psychiatry

## 2017-01-16 ENCOUNTER — Other Ambulatory Visit (HOSPITAL_COMMUNITY): Payer: Self-pay | Admitting: Psychiatry

## 2017-01-16 DIAGNOSIS — F330 Major depressive disorder, recurrent, mild: Secondary | ICD-10-CM

## 2017-01-22 ENCOUNTER — Ambulatory Visit (INDEPENDENT_AMBULATORY_CARE_PROVIDER_SITE_OTHER): Payer: 59 | Admitting: Psychiatry

## 2017-01-22 ENCOUNTER — Encounter (HOSPITAL_COMMUNITY): Payer: Self-pay | Admitting: Psychiatry

## 2017-01-22 DIAGNOSIS — F17210 Nicotine dependence, cigarettes, uncomplicated: Secondary | ICD-10-CM

## 2017-01-22 DIAGNOSIS — Z79899 Other long term (current) drug therapy: Secondary | ICD-10-CM | POA: Diagnosis not present

## 2017-01-22 DIAGNOSIS — F900 Attention-deficit hyperactivity disorder, predominantly inattentive type: Secondary | ICD-10-CM | POA: Diagnosis not present

## 2017-01-22 DIAGNOSIS — F330 Major depressive disorder, recurrent, mild: Secondary | ICD-10-CM

## 2017-01-22 MED ORDER — LAMOTRIGINE 150 MG PO TABS: 150.0000 mg | ORAL_TABLET | Freq: Two times a day (BID) | ORAL | 0 refills | Status: DC

## 2017-01-22 MED ORDER — METHYLPHENIDATE HCL 20 MG PO TABS: 20.0000 mg | ORAL_TABLET | Freq: Four times a day (QID) | ORAL | 0 refills | Status: DC

## 2017-01-22 MED ORDER — ARIPIPRAZOLE 10 MG PO TABS
10.0000 mg | ORAL_TABLET | Freq: Every day | ORAL | 0 refills | Status: DC
Start: 2017-01-22 — End: 2017-04-28

## 2017-01-22 MED ORDER — BUPROPION HCL ER (XL) 300 MG PO TB24
300.0000 mg | ORAL_TABLET | Freq: Every day | ORAL | 0 refills | Status: DC
Start: 2017-01-22 — End: 2017-04-28

## 2017-01-22 NOTE — Progress Notes (Signed)
[place] MD/PA/NP OP Progress Note  01/22/2017 11:43 AM Dakota Vang  MRN:  [id number]  Chief Complaint: I am doing good.  I had a good cruise trip vacation.  HPI: Dakota Vang came for his follow-up appointment.  He is compliant with his medication. With Abilify Ritalin Lamictal and Wellbutrin.  He denies any tremors shakes or any EPS.  Recently he had a cruise trip with his wife and he had a wonderful time.  He is happy because he is able to do multitasking.  His attention, concentration is improved.  His job is going very well.  He is not sleeping during the daytime.  His blood pressure is also well controlled.  He denies any irritability, anger, mania or any psychosis.  He has not seen primary care physician in a while but he is planning to contact Dr. Deforest Hoyles for future physical and blood work.  He is more relaxed and calm.  He has no rash, itching tremors or any shakes.  His energy level is good.  His appetite is okay.  His vital signs are stable.  He is no longer taking Xanax.  Visit Diagnosis:    ICD-10-CM   1. Attention deficit hyperactivity disorder (ADHD), predominantly inattentive type F90.0 methylphenidate (RITALIN) 20 MG tablet    DISCONTINUED: methylphenidate (RITALIN) 20 MG tablet    DISCONTINUED: methylphenidate (RITALIN) 20 MG tablet  2. Major depressive disorder, recurrent episode, mild (HCC) F33.0 lamoTRIgine (LAMICTAL) 150 MG tablet    buPROPion (WELLBUTRIN XL) 300 MG 24 hr tablet    ARIPiprazole (ABILIFY) 10 MG tablet    Past Psychiatric History: Reviewed. Patient has taken Wellbutrin in the past with good response. He also had tried Prozac for many years prescribed by primary care physician. We have tried him on Cymbalta and Lexaprobut it did not helped. Patient denies any history of psychiatric inpatient treatment.  Past Medical History:  Past Medical History:  Diagnosis Date  . ADD (attention deficit disorder with hyperactivity)    Dr. Donalee Citrin  . Anxiety    Dr. Donalee Citrin   . Colon polyps 2008   Dr Fuller Plan  . COLONIC POLYPS, HX OF 11/20/2007   Qualifier: Diagnosis of  By: Linna Darner MD, Gwyndolyn Saxon   Last colonoscopy negative 2013 PMH of plyps X 2; Dr Fuller Plan, GI    . Diverticulitis    PMH of  . Diverticulitis of jejunum with microperforation s/p lap resection [id number] 08/28/2012   Qualifier: Diagnosis of  By: Linna Darner MD, Gwyndolyn Saxon   6/1-3/14 [facility] ,[place] : ? Jejunal perforation F/U CT recommended 08/24/12   . Diverticulitis of sigmoid colon 06/24/2008   2005 by CT scan    . Diverticulosis 2002   Dr Velora Heckler  . Duodenal diverticulum 08/28/2012  . GERD (gastroesophageal reflux disease)   . Hyperlipidemia   . Hypertension   . Pancolonic diverticulosis 09/09/2012    Past Surgical History:  Procedure Laterality Date  . COLON SURGERY    . COLONOSCOPY  2013   Dr Fuller Plan  . COLONOSCOPY W/ POLYPECTOMY  2002 & 2008   Dr. Fuller Plan; polyps X 2  . LAPAROSCOPIC SMALL BOWEL RESECTION  09/24/2012   excision of proximal jejunum for jejunal diverticulitis  . LASIK     bilaterally  . TONSILLECTOMY      Family Psychiatric History: Reviewed.  Family History:  Family History  Problem Relation Age of Onset  . Colon cancer Father        in 85s  . Lung cancer Father  smoker; asbestos exposure  . Cancer Father        Mesothelioma  . Coronary artery disease Mother        CAGB X 2 & angio 3-4X  . Cancer Mother        Gallbladder  . Coronary artery disease Maternal Grandmother   . COPD Maternal Grandmother   . Heart attack Maternal Grandfather        late 60's  . Heart attack Paternal Grandfather 51  . Diabetes Paternal Uncle        Dialysis  . Stroke Neg Hx     Social History:  Social History   Socioeconomic History  . Marital status: Married    Spouse name: Not on file  . Number of children: Not on file  . Years of education: Not on file  . Highest education level: Not on file  Social Needs  . Financial resource strain: Not on file  . Food  insecurity - worry: Not on file  . Food insecurity - inability: Not on file  . Transportation needs - medical: Not on file  . Transportation needs - non-medical: Not on file  Occupational History  . Not on file  Tobacco Use  . Smoking status: Current Some Day Smoker    Packs/day: 0.25    Types: Cigarettes  . Smokeless tobacco: Never Used  . Tobacco comment: smoked 1988-present ,up to 1 ppd. 08/22/11 averaging 2-3 /day  Substance and Sexual Activity  . Alcohol use: No  . Drug use: No  . Sexual activity: Yes  Other Topics Concern  . Not on file  Social History Narrative  . Not on file    Allergies:  Allergies  Allergen Reactions  . Penicillins Other (See Comments)    Reaction:  Unknown  Has patient had a PCN reaction causing immediate rash, facial/tongue/throat swelling, SOB or lightheadedness with hypotension: Unsure Has patient had a PCN reaction causing severe rash involving mucus membranes or skin necrosis: Unsure Has patient had a PCN reaction that required hospitalization Unsure Has patient had a PCN reaction occurring within the last 10 years: No If all of the above answers are "NO", then may proceed with Cephalosporin use.    Metabolic Disorder Labs: Lab Results  Component Value Date   HGBA1C 5.7 08/26/2014   No results found for: PROLACTIN Lab Results  Component Value Date   CHOL 166 07/26/2015   TRIG 250.0 (H) 07/26/2015   HDL 30.80 (L) 07/26/2015   CHOLHDL 5 07/26/2015   VLDL 50.0 (H) 07/26/2015   LDLCALC 88 08/22/2014   LDLCALC 101 (H) 08/18/2013   Lab Results  Component Value Date   TSH 2.52 07/26/2015   TSH 1.64 08/22/2014    Therapeutic Level Labs: No results found for: LITHIUM No results found for: VALPROATE No components found for:  CBMZ  Current Medications: Current Outpatient Medications  Medication Sig Dispense Refill  . ALPRAZolam (XANAX) 1 MG tablet Take 1 mg by mouth daily as needed for anxiety.    Marland Kitchen amLODipine (NORVASC) 5 MG tablet  Take 5 mg by mouth daily.    . ARIPiprazole (ABILIFY) 10 MG tablet Take 1 tablet (10 mg total) by mouth daily. 90 tablet 0  . aspirin EC 81 MG tablet Take 81 mg by mouth daily.     Marland Kitchen buPROPion (WELLBUTRIN XL) 300 MG 24 hr tablet Take 1 tablet (300 mg total) by mouth daily. 90 tablet 0  . clindamycin (CLEOCIN T) 1 % external solution Apply 1  application topically as needed (for breakouts on neck).     . fluticasone (FLONASE) 50 MCG/ACT nasal spray Place 2 sprays into both nostrils daily as needed for rhinitis.    Marland Kitchen lamoTRIgine (LAMICTAL) 150 MG tablet Take 1 tablet (150 mg total) by mouth 2 (two) times daily. 180 tablet 0  . methylphenidate (RITALIN) 20 MG tablet Take 1 tablet (20 mg total) by mouth 4 (four) times daily. 120 tablet 0  . omeprazole (PRILOSEC) 20 MG capsule Take 20 mg by mouth daily.    . ramipril (ALTACE) 10 MG capsule Take 10 mg by mouth daily.    . rosuvastatin (CRESTOR) 10 MG tablet Take 1 tablet (10 mg total) by mouth daily. 90 tablet 1   No current facility-administered medications for this visit.      Musculoskeletal: Strength & Muscle Tone: within normal limits Gait & Station: normal Patient leans: N/A  Psychiatric Specialty Exam: ROS  Blood pressure 128/78, pulse 97, height 6\' 1"  (1.854 m), weight 243 lb (110.2 kg).There is no height or weight on file to calculate BMI.  General Appearance: Casual  Eye Contact:  Good  Speech:  Clear and Coherent  Volume:  Normal  Mood:  Euthymic  Affect:  Appropriate  Thought Process:  Goal Directed  Orientation:  Full (Time, Place, and Person)  Thought Content: Logical   Suicidal Thoughts:  No  Homicidal Thoughts:  No  Memory:  Immediate;   Good Recent;   Good Remote;   Good  Judgement:  Good  Insight:  Good  Psychomotor Activity:  Normal  Concentration:  Concentration: Good and Attention Span: Good  Recall:  Good  Fund of Knowledge: Good  Language: Good  Akathisia:  No  Handed:  Right  AIMS (if indicated): not  done  Assets:  Communication Skills Desire for Improvement Housing Resilience Talents/Skills Transportation  ADL's:  Intact  Cognition: WNL  Sleep:  Good   Screenings: PHQ2-9     Office Visit from 08/30/2015 in [facility] Office Visit from 07/26/2015 in [place]  PHQ-2 Total Score  0  0       Assessment and Plan: Major depressive disorder, recurrent.  Attention deficit disorder inattentive type.   Patient doing better on his current psychiatric medication.  He has no tremors, shakes or any EPS.  Continue Abilify 10 mg daily, Ritalin 20 mg 4 times a day, Wellbutrin XL 300 mg daily and Lamictal 300 mg daily.  He has no concerns.  Discussed medication side effects and benefits.  Discussed stimulant abuse, tremors, and withdrawal symptoms.  Recommended to call us back if he has any question or any concern.  Follow-up in 3 months.  Darra Rosa T., MD 01/22/2017, 11:43 AM

## 2017-03-17 ENCOUNTER — Other Ambulatory Visit (HOSPITAL_COMMUNITY): Payer: Self-pay | Admitting: Psychiatry

## 2017-03-17 DIAGNOSIS — F330 Major depressive disorder, recurrent, mild: Secondary | ICD-10-CM

## 2017-04-08 ENCOUNTER — Other Ambulatory Visit (HOSPITAL_COMMUNITY): Payer: Self-pay | Admitting: Psychiatry

## 2017-04-08 DIAGNOSIS — F330 Major depressive disorder, recurrent, mild: Secondary | ICD-10-CM

## 2017-04-16 DIAGNOSIS — Z23 Encounter for immunization: Secondary | ICD-10-CM | POA: Diagnosis not present

## 2017-04-16 DIAGNOSIS — I10 Essential (primary) hypertension: Secondary | ICD-10-CM | POA: Diagnosis not present

## 2017-04-16 DIAGNOSIS — E7800 Pure hypercholesterolemia, unspecified: Secondary | ICD-10-CM | POA: Diagnosis not present

## 2017-04-28 ENCOUNTER — Encounter (HOSPITAL_COMMUNITY): Payer: Self-pay | Admitting: Psychiatry

## 2017-04-28 ENCOUNTER — Ambulatory Visit (INDEPENDENT_AMBULATORY_CARE_PROVIDER_SITE_OTHER): Payer: 59 | Admitting: Psychiatry

## 2017-04-28 VITALS — BP 128/96 | HR 94 | Ht 73.0 in | Wt 245.6 lb

## 2017-04-28 DIAGNOSIS — F17210 Nicotine dependence, cigarettes, uncomplicated: Secondary | ICD-10-CM

## 2017-04-28 DIAGNOSIS — F411 Generalized anxiety disorder: Secondary | ICD-10-CM | POA: Diagnosis not present

## 2017-04-28 DIAGNOSIS — F330 Major depressive disorder, recurrent, mild: Secondary | ICD-10-CM

## 2017-04-28 DIAGNOSIS — F900 Attention-deficit hyperactivity disorder, predominantly inattentive type: Secondary | ICD-10-CM

## 2017-04-28 MED ORDER — METHYLPHENIDATE HCL 20 MG PO TABS: 20.0000 mg | ORAL_TABLET | Freq: Four times a day (QID) | ORAL | 0 refills | Status: DC

## 2017-04-28 MED ORDER — BUPROPION HCL ER (XL) 300 MG PO TB24: 300.0000 mg | ORAL_TABLET | Freq: Every day | ORAL | 0 refills | Status: DC

## 2017-04-28 MED ORDER — CLONAZEPAM 1 MG PO TABS: ORAL_TABLET | ORAL | 0 refills | Status: DC

## 2017-04-28 MED ORDER — LAMOTRIGINE 150 MG PO TABS: 150.0000 mg | ORAL_TABLET | Freq: Two times a day (BID) | ORAL | 0 refills | Status: DC

## 2017-04-28 MED ORDER — ARIPIPRAZOLE 10 MG PO TABS: 10.0000 mg | ORAL_TABLET | Freq: Every day | ORAL | 0 refills | Status: DC

## 2017-04-28 NOTE — Progress Notes (Signed)
[place] MD/PA/NP OP Progress Note  04/28/2017 10:08 AM Dakota Vang  MRN:  [id number]  Chief Complaint: I am doing good.  I saw Dr. Deforest Hoyles who prescribed medication for my blood pressure, cholesterol but did not give me Xanax.  HPI: She came for his follow-up appointment.  He had a good holidays.  He admitted weight gain but now is working on losing the weight.  Is been very busy at his work.  He likes his medication.  He is able to do multitasking and his attention concentration is good.  Denies any agitation, anger, mania or any mood swings.  He saw Dr. Deforest Hoyles for his physical and blood work.  He is scheduled to have blood work next week.  Dr. Pattricia Boss refill all his medication which he was getting from his primary care physician except the Xanax.  Patient was getting Xanax from his primary care physician.  Patient is very concerned and anxious about not getting Xanax.  He does not want to have panic attacks.  Overall he describes his mood is good.  His energy level is good.  He has no tremors, shakes, rash or any itching.  Today his diastolic blood pressure is little high but he admitted drinking too much coffee before coming to the appointment.  Patient was prescribed Xanax 1 mg up to 3 times a day but he usually takes 1 in the morning and sometimes second in the afternoon.  Patient denies drinking alcohol or using any illegal substances.  Visit Diagnosis:    ICD-10-CM   1. Generalized anxiety disorder F41.1 clonazePAM (KLONOPIN) 1 MG tablet  2. Major depressive disorder, recurrent episode, mild (HCC) F33.0 clonazePAM (KLONOPIN) 1 MG tablet    ARIPiprazole (ABILIFY) 10 MG tablet    lamoTRIgine (LAMICTAL) 150 MG tablet    buPROPion (WELLBUTRIN XL) 300 MG 24 hr tablet  3. Attention deficit hyperactivity disorder (ADHD), predominantly inattentive type F90.0 methylphenidate (RITALIN) 20 MG tablet    DISCONTINUED: methylphenidate (RITALIN) 20 MG tablet    DISCONTINUED: methylphenidate (RITALIN) 20 MG  tablet    Past Psychiatric History: Updated. Patient has taken Wellbutrin in the past with good response. He also had tried Prozac for many years prescribed by primary care physician. We have tried him on Cymbalta and Lexaprobut it did not helped. Patient denies any history of psychiatric inpatient treatment.  Past Medical History:  Past Medical History:  Diagnosis Date  . ADD (attention deficit disorder with hyperactivity)    Dr. Donalee Citrin  . Anxiety    Dr. Donalee Citrin  . Colon polyps 2008   Dr Fuller Plan  . COLONIC POLYPS, HX OF 11/20/2007   Qualifier: Diagnosis of  By: Linna Darner MD, Gwyndolyn Saxon   Last colonoscopy negative 2013 PMH of plyps X 2; Dr Fuller Plan, GI    . Diverticulitis    PMH of  . Diverticulitis of jejunum with microperforation s/p lap resection [id number] 08/28/2012   Qualifier: Diagnosis of  By: Linna Darner MD, Gwyndolyn Saxon   6/1-3/14 [facility] : ? Jejunal perforation F/U CT recommended 08/24/12   . Diverticulitis of sigmoid colon 06/24/2008   2005 by CT scan    . Diverticulosis 2002   Dr Velora Heckler  . Duodenal diverticulum 08/28/2012  . GERD (gastroesophageal reflux disease)   . Hyperlipidemia   . Hypertension   . Pancolonic diverticulosis 09/09/2012    Past Surgical History:  Procedure Laterality Date  . COLON SURGERY    . COLONOSCOPY  2013   Dr Fuller Plan  . COLONOSCOPY  W/ POLYPECTOMY  2002 & 2008   Dr. Fuller Plan; polyps X 2  . LAPAROSCOPIC PARTIAL COLECTOMY N/A 09/24/2012   Procedure: laparoscopic assisted  resection of proximal jejunum containing the jejunal diverticulum;  Surgeon: [name], MD;  Location: [facility];  Service: General;  Laterality: N/A;  . LAPAROSCOPIC SMALL BOWEL RESECTION  09/24/2012   excision of proximal jejunum for jejunal diverticulitis  . LASIK     bilaterally  . TONSILLECTOMY      Family Psychiatric History: Update  Family History:  Family History  Problem Relation Age of Onset  . Colon cancer Father        in 43s  . Lung cancer Father         smoker; asbestos exposure  . Cancer Father        Mesothelioma  . Coronary artery disease Mother        CAGB X 2 & angio 3-4X  . Cancer Mother        Gallbladder  . Coronary artery disease Maternal Grandmother   . COPD Maternal Grandmother   . Heart attack Maternal Grandfather        late 60's  . Heart attack Paternal Grandfather 73  . Diabetes Paternal Uncle        Dialysis  . Stroke Neg Hx     Social History:  Social History   Socioeconomic History  . Marital status: Married    Spouse name: None  . Number of children: None  . Years of education: None  . Highest education level: None  Social Needs  . Financial resource strain: None  . Food insecurity - worry: None  . Food insecurity - inability: None  . Transportation needs - medical: None  . Transportation needs - non-medical: None  Occupational History  . None  Tobacco Use  . Smoking status: Current Some Day Smoker    Packs/day: 0.25    Types: Cigarettes  . Smokeless tobacco: Never Used  . Tobacco comment: smoked 1988-present ,up to 1 ppd. 08/22/11 averaging 2-3 /day  Substance and Sexual Activity  . Alcohol use: No  . Drug use: No  . Sexual activity: Yes  Other Topics Concern  . None  Social History Narrative  . None    Allergies:  Allergies  Allergen Reactions  . Penicillins Other (See Comments)    Reaction:  Unknown  Has patient had a PCN reaction causing immediate rash, facial/tongue/throat swelling, SOB or lightheadedness with hypotension: Unsure Has patient had a PCN reaction causing severe rash involving mucus membranes or skin necrosis: Unsure Has patient had a PCN reaction that required hospitalization Unsure Has patient had a PCN reaction occurring within the last 10 years: No If all of the above answers are "NO", then may proceed with Cephalosporin use.    Metabolic Disorder Labs: Lab Results  Component Value Date   HGBA1C 5.7 08/26/2014   No results found for: PROLACTIN Lab  Results  Component Value Date   CHOL 166 07/26/2015   TRIG 250.0 (H) 07/26/2015   HDL 30.80 (L) 07/26/2015   CHOLHDL 5 07/26/2015   VLDL 50.0 (H) 07/26/2015   LDLCALC 88 08/22/2014   LDLCALC 101 (H) 08/18/2013   Lab Results  Component Value Date   TSH 2.52 07/26/2015   TSH 1.64 08/22/2014    Therapeutic Level Labs: No results found for: LITHIUM No results found for: VALPROATE No components found for:  CBMZ  Current Medications: Current Outpatient Medications  Medication Sig Dispense Refill  .  ALPRAZolam (XANAX) 1 MG tablet Take 1 mg by mouth daily as needed for anxiety.    Marland Kitchen amLODipine (NORVASC) 5 MG tablet Take 5 mg by mouth daily.    . ARIPiprazole (ABILIFY) 10 MG tablet Take 1 tablet (10 mg total) daily by mouth. 90 tablet 0  . aspirin EC 81 MG tablet Take 81 mg by mouth daily.     Marland Kitchen buPROPion (WELLBUTRIN XL) 300 MG 24 hr tablet Take 1 tablet (300 mg total) daily by mouth. 90 tablet 0  . clindamycin (CLEOCIN T) 1 % external solution Apply 1 application topically as needed (for breakouts on neck).     . fluticasone (FLONASE) 50 MCG/ACT nasal spray Place 2 sprays into both nostrils daily as needed for rhinitis.    Marland Kitchen lamoTRIgine (LAMICTAL) 150 MG tablet Take 1 tablet (150 mg total) 2 (two) times daily by mouth. 180 tablet 0  . methylphenidate (RITALIN) 20 MG tablet Take 1 tablet (20 mg total) 4 (four) times daily by mouth. 120 tablet 0  . omeprazole (PRILOSEC) 20 MG capsule Take 20 mg by mouth daily.    . ramipril (ALTACE) 10 MG capsule Take 10 mg by mouth daily.    . rosuvastatin (CRESTOR) 10 MG tablet Take 1 tablet (10 mg total) by mouth daily. 90 tablet 1   No current facility-administered medications for this visit.      Musculoskeletal: Strength & Muscle Tone: within normal limits Gait & Station: normal Patient leans: N/A  Psychiatric Specialty Exam: ROS  Blood pressure (!) 128/96, pulse 94, height 6\' 1"  (1.854 m), weight 245 lb 9.6 oz (111.4 kg).Body mass index  is 32.4 kg/m.  General Appearance: Casual  Eye Contact:  Good  Speech:  Clear and Coherent  Volume:  Normal  Mood:  Euthymic  Affect:  Appropriate  Thought Process:  Goal Directed  Orientation:  Full (Time, Place, and Person)  Thought Content: WDL and Logical   Suicidal Thoughts:  No  Homicidal Thoughts:  No  Memory:  Immediate;   Good Recent;   Good Remote;   Good  Judgement:  Good  Insight:  Good  Psychomotor Activity:  Normal  Concentration:  Concentration: Good and Attention Span: Good  Recall:  Good  Fund of Knowledge: Good  Language: Good  Akathisia:  No  Handed:  Right  AIMS (if indicated): not done  Assets:  Communication Skills Desire for Improvement Financial Resources/Insurance Housing Resilience Social Support  ADL's:  Intact  Cognition: WNL  Sleep:  Good   Screenings: PHQ2-9     Office Visit from 08/30/2015 in [place] Office Visit from 07/26/2015 in [place]  PHQ-2 Total Score  0  0       Assessment and Plan: Attention deficit disorder, inattentive type.  Major depressive disorder, recurrent.  Generalized anxiety disorder.  Patient is doing better on his current psychiatric medication.  We discussed Xanax dependence, withdrawal and tolerance.  I recommended he should try Klonopin instead of Xanax to help his anxiety.  Patient is reluctant but willing to give a chance.  I explained if Klonopin did not help him then he can go back to his primary care physician to prescribe Xanax.  We also discussed polypharmacy.  Patient is very reluctant to change or reducing his medication since it is working very well.  Continue Ritalin 20 mg 4 times a day, Abilify 10 mg daily, Wellbutrin XL 300 mg daily and Lamictal 300 mg daily.  Patient  is not interested in counseling.  Discussed stimulant abuse, withdrawal and tolerance.  Commended to call us back if he has any question or any concern.   We provided only 1 month supply of Klonopin and if he liked it then he will call us back for more refills.  Follow-up in 3 months   Kathlee Nations, MD 04/28/2017, 10:08 AM

## 2017-04-30 ENCOUNTER — Telehealth (HOSPITAL_COMMUNITY): Payer: Self-pay

## 2017-04-30 NOTE — Telephone Encounter (Signed)
I called the patient and he said that he told you he had the Xanax and you told him not to throw it away, but that you wanted him to try Xanax. I explained to the patient that was was not what was in the note. He wants you to call him. [phone]

## 2017-04-30 NOTE — Telephone Encounter (Signed)
I returned patient's phone call.  He apologized not mentioning that he picked up the Xanax from Dr. Nicki Reaper long.  However he like to try Klonopin because of his longer half-life.  We explained that he can try Klonopin but if he will continue the Klonopin that he need to stop the Xanax immediately.  Patient agreed with the plan.  We will call the pharmacy to resume Klonopin.

## 2017-05-10 NOTE — Telephone Encounter (Signed)
Okay, I called pharmacy and advised them to go ahead an fill Klonopin.

## 2017-06-21 ENCOUNTER — Other Ambulatory Visit (HOSPITAL_COMMUNITY): Payer: Self-pay | Admitting: Psychiatry

## 2017-06-21 DIAGNOSIS — F330 Major depressive disorder, recurrent, mild: Secondary | ICD-10-CM

## 2017-07-02 DIAGNOSIS — L719 Rosacea, unspecified: Secondary | ICD-10-CM | POA: Diagnosis not present

## 2017-07-02 DIAGNOSIS — K439 Ventral hernia without obstruction or gangrene: Secondary | ICD-10-CM | POA: Diagnosis not present

## 2017-07-08 ENCOUNTER — Other Ambulatory Visit (HOSPITAL_COMMUNITY): Payer: Self-pay | Admitting: Psychiatry

## 2017-07-08 DIAGNOSIS — F330 Major depressive disorder, recurrent, mild: Secondary | ICD-10-CM

## 2017-07-09 ENCOUNTER — Other Ambulatory Visit (HOSPITAL_COMMUNITY): Payer: Self-pay | Admitting: Psychiatry

## 2017-07-09 DIAGNOSIS — F330 Major depressive disorder, recurrent, mild: Secondary | ICD-10-CM

## 2017-07-09 DIAGNOSIS — F411 Generalized anxiety disorder: Secondary | ICD-10-CM

## 2017-07-14 DIAGNOSIS — E7800 Pure hypercholesterolemia, unspecified: Secondary | ICD-10-CM | POA: Diagnosis not present

## 2017-07-14 DIAGNOSIS — Z23 Encounter for immunization: Secondary | ICD-10-CM | POA: Diagnosis not present

## 2017-07-14 DIAGNOSIS — Z0000 Encounter for general adult medical examination without abnormal findings: Secondary | ICD-10-CM | POA: Diagnosis not present

## 2017-07-21 ENCOUNTER — Ambulatory Visit (INDEPENDENT_AMBULATORY_CARE_PROVIDER_SITE_OTHER): Payer: 59 | Admitting: Psychiatry

## 2017-07-21 ENCOUNTER — Encounter (HOSPITAL_COMMUNITY): Payer: Self-pay | Admitting: Psychiatry

## 2017-07-21 DIAGNOSIS — F330 Major depressive disorder, recurrent, mild: Secondary | ICD-10-CM | POA: Diagnosis not present

## 2017-07-21 DIAGNOSIS — F17210 Nicotine dependence, cigarettes, uncomplicated: Secondary | ICD-10-CM | POA: Diagnosis not present

## 2017-07-21 DIAGNOSIS — R450 Nervousness: Secondary | ICD-10-CM | POA: Diagnosis not present

## 2017-07-21 DIAGNOSIS — F411 Generalized anxiety disorder: Secondary | ICD-10-CM | POA: Diagnosis not present

## 2017-07-21 DIAGNOSIS — F900 Attention-deficit hyperactivity disorder, predominantly inattentive type: Secondary | ICD-10-CM

## 2017-07-21 DIAGNOSIS — Z6379 Other stressful life events affecting family and household: Secondary | ICD-10-CM | POA: Diagnosis not present

## 2017-07-21 MED ORDER — METHYLPHENIDATE HCL 20 MG PO TABS: 20.0000 mg | ORAL_TABLET | Freq: Four times a day (QID) | ORAL | 0 refills | Status: DC

## 2017-07-21 MED ORDER — LAMOTRIGINE 150 MG PO TABS: 150.0000 mg | ORAL_TABLET | Freq: Two times a day (BID) | ORAL | 0 refills | Status: DC

## 2017-07-21 MED ORDER — BUPROPION HCL ER (XL) 300 MG PO TB24: 300.0000 mg | ORAL_TABLET | Freq: Every day | ORAL | 0 refills | Status: DC

## 2017-07-21 MED ORDER — ARIPIPRAZOLE 10 MG PO TABS: 10.0000 mg | ORAL_TABLET | Freq: Every day | ORAL | 0 refills | Status: DC

## 2017-07-21 MED ORDER — CLONAZEPAM 1 MG PO TABS: ORAL_TABLET | ORAL | 2 refills | Status: DC

## 2017-07-21 NOTE — Progress Notes (Signed)
[place] MD/PA/NP OP Progress Note  07/21/2017 12:53 PM Dakota Vang  MRN:  [id number]  Chief Complaint: I am very anxious because I am out of Klonopin.  HPI: Dakota Vang came for his follow-up appointment.  He is anxious because he is without his Klonopin for 1 week.  He is no longer taking Xanax.  He actually like the Klonopin which is helping his anxiety and nervousness.  He is overwhelmed because his 52 year old son quit his job and now wants to move in with him and eventually wants to move Dakota Vang.  He gets upset on his son but he has no other choice.  Overall he describes his mood anxious related to his son.  However he is sleeping good.  He denies any irritability, anger, mania or any psychosis.  He is able to do multitasking and his attention concentration is good on Ritalin.  Recently he seen Dr. Deforest Hoyles and he has complete blood work and there were no changes in his blood pressure medication or physical need.  He is sleeping good.  His energy level is good.  He denies any major panic attack.  Patient denies drinking or using any illegal substances.  He is not interested in counseling.  His job is going very well.  He is hoping to get medication in the summer.  Visit Diagnosis:    ICD-10-CM   1. Major depressive disorder, recurrent episode, mild (HCC) F33.0 lamoTRIgine (LAMICTAL) 150 MG tablet    clonazePAM (KLONOPIN) 1 MG tablet    ARIPiprazole (ABILIFY) 10 MG tablet    buPROPion (WELLBUTRIN XL) 300 MG 24 hr tablet  2. Generalized anxiety disorder F41.1 clonazePAM (KLONOPIN) 1 MG tablet  3. Attention deficit hyperactivity disorder (ADHD), predominantly inattentive type F90.0 methylphenidate (RITALIN) 20 MG tablet    DISCONTINUED: methylphenidate (RITALIN) 20 MG tablet    DISCONTINUED: methylphenidate (RITALIN) 20 MG tablet    Past Psychiatric History: Reviewed. Patient has taken Wellbutrin in the past with good response. He also had tried Prozac for many years prescribed by primary care  physician. We have tried him on Cymbalta and Lexaprobut it did not helped. Patient denies any history of psychiatric inpatient treatment.  Past Medical History:  Past Medical History:  Diagnosis Date  . ADD (attention deficit disorder with hyperactivity)    Dr. Donalee Citrin  . Anxiety    Dr. Donalee Citrin  . Colon polyps 2008   Dr Fuller Plan  . COLONIC POLYPS, HX OF 11/20/2007   Qualifier: Diagnosis of  By: Linna Darner MD, Gwyndolyn Saxon   Last colonoscopy negative 2013 PMH of plyps X 2; Dr Fuller Plan, GI    . Diverticulitis    PMH of  . Diverticulitis of jejunum with microperforation s/p lap resection [id number] 08/28/2012   Qualifier: Diagnosis of  By: Linna Darner MD, Gwyndolyn Saxon   6/1-3/14 [facility] : ? Jejunal perforation F/U CT recommended 08/24/12   . Diverticulitis of sigmoid colon 06/24/2008   2005 by CT scan    . Diverticulosis 2002   Dr Velora Heckler  . Duodenal diverticulum 08/28/2012  . GERD (gastroesophageal reflux disease)   . Hyperlipidemia   . Hypertension   . Pancolonic diverticulosis 09/09/2012    Past Surgical History:  Procedure Laterality Date  . COLON SURGERY    . COLONOSCOPY  2013   Dr Fuller Plan  . COLONOSCOPY W/ POLYPECTOMY  2002 & 2008   Dr. Fuller Plan; polyps X 2  . LAPAROSCOPIC PARTIAL COLECTOMY N/A 09/24/2012   Procedure: laparoscopic assisted  resection of proximal jejunum containing  the jejunal diverticulum;  Surgeon: [name], MD;  Location: [facility];  Service: General;  Laterality: N/A;  . LAPAROSCOPIC SMALL BOWEL RESECTION  09/24/2012   excision of proximal jejunum for jejunal diverticulitis  . LASIK     bilaterally  . TONSILLECTOMY      Family Psychiatric History: Reviewed.  Family History:  Family History  Problem Relation Age of Onset  . Colon cancer Father        in 41s  . Lung cancer Father        smoker; asbestos exposure  . Cancer Father        Mesothelioma  . Coronary artery disease Mother        CAGB X 2 & angio 3-4X  . Cancer Mother         Gallbladder  . Coronary artery disease Maternal Grandmother   . COPD Maternal Grandmother   . Heart attack Maternal Grandfather        late 60's  . Heart attack Paternal Grandfather 75  . Diabetes Paternal Uncle        Dialysis  . Stroke Neg Hx     Social History:  Social History   Socioeconomic History  . Marital status: Married    Spouse name: Not on file  . Number of children: Not on file  . Years of education: Not on file  . Highest education level: Not on file  Occupational History  . Not on file  Social Needs  . Financial resource strain: Not on file  . Food insecurity:    Worry: Not on file    Inability: Not on file  . Transportation needs:    Medical: Not on file    Non-medical: Not on file  Tobacco Use  . Smoking status: Current Some Day Smoker    Packs/day: 0.25    Types: Cigarettes  . Smokeless tobacco: Never Used  . Tobacco comment: smoked 1988-present ,up to 1 ppd. 08/22/11 averaging 2-3 /day  Substance and Sexual Activity  . Alcohol use: No  . Drug use: No  . Sexual activity: Yes  Lifestyle  . Physical activity:    Days per week: Not on file    Minutes per session: Not on file  . Stress: Not on file  Relationships  . Social connections:    Talks on phone: Not on file    Gets together: Not on file    Attends religious service: Not on file    Active member of club or organization: Not on file    Attends meetings of clubs or organizations: Not on file    Relationship status: Not on file  Other Topics Concern  . Not on file  Social History Narrative  . Not on file    Allergies:  Allergies  Allergen Reactions  . Penicillins Other (See Comments)    Reaction:  Unknown  Has patient had a PCN reaction causing immediate rash, facial/tongue/throat swelling, SOB or lightheadedness with hypotension: Unsure Has patient had a PCN reaction causing severe rash involving mucus membranes or skin necrosis: Unsure Has patient had a PCN reaction that required  hospitalization Unsure Has patient had a PCN reaction occurring within the last 10 years: No If all of the above answers are "NO", then may proceed with Cephalosporin use.    Metabolic Disorder Labs: Lab Results  Component Value Date   HGBA1C 5.7 08/26/2014   No results found for: PROLACTIN Lab Results  Component Value Date   CHOL 166  07/26/2015   TRIG 250.0 (H) 07/26/2015   HDL 30.80 (L) 07/26/2015   CHOLHDL 5 07/26/2015   VLDL 50.0 (H) 07/26/2015   LDLCALC 88 08/22/2014   LDLCALC 101 (H) 08/18/2013   Lab Results  Component Value Date   TSH 2.52 07/26/2015   TSH 1.64 08/22/2014    Therapeutic Level Labs: No results found for: LITHIUM No results found for: VALPROATE No components found for:  CBMZ  Current Medications: Current Outpatient Medications  Medication Sig Dispense Refill  . amLODipine (NORVASC) 5 MG tablet Take 5 mg by mouth daily.    . ARIPiprazole (ABILIFY) 10 MG tablet Take 1 tablet (10 mg total) by mouth daily. 90 tablet 0  . aspirin EC 81 MG tablet Take 81 mg by mouth daily.     Marland Kitchen buPROPion (WELLBUTRIN XL) 300 MG 24 hr tablet Take 1 tablet (300 mg total) by mouth daily. 90 tablet 0  . clindamycin (CLEOCIN T) 1 % external solution Apply 1 application topically as needed (for breakouts on neck).     . clonazePAM (KLONOPIN) 1 MG tablet Take 1/2 to 1 tab in am and 1/2 at noon 45 tablet 0  . fluticasone (FLONASE) 50 MCG/ACT nasal spray Place 2 sprays into both nostrils daily as needed for rhinitis.    Marland Kitchen lamoTRIgine (LAMICTAL) 150 MG tablet Take 1 tablet (150 mg total) by mouth 2 (two) times daily. 180 tablet 0  . methylphenidate (RITALIN) 20 MG tablet Take 1 tablet (20 mg total) by mouth 4 (four) times daily. 120 tablet 0  . omeprazole (PRILOSEC) 20 MG capsule Take 20 mg by mouth daily.    . ramipril (ALTACE) 10 MG capsule Take 10 mg by mouth daily.    . rosuvastatin (CRESTOR) 10 MG tablet Take 1 tablet (10 mg total) by mouth daily. 90 tablet 1   No current  facility-administered medications for this visit.      Musculoskeletal: Strength & Muscle Tone: within normal limits Gait & Station: normal Patient leans: N/A  Psychiatric Specialty Exam: Review of Systems  Constitutional: Positive for weight loss.  Psychiatric/Behavioral: The patient is nervous/anxious.     Blood pressure 128/74, pulse (!) 101, height 6\' 1"  (1.854 m), weight 232 lb (105.2 kg).There is no height or weight on file to calculate BMI.  General Appearance: Casual  Eye Contact:  Good  Speech:  Clear and Coherent  Volume:  Normal  Mood:  Anxious  Affect:  Congruent  Thought Process:  Goal Directed  Orientation:  Full (Time, Place, and Person)  Thought Content: Logical   Suicidal Thoughts:  No  Homicidal Thoughts:  No  Memory:  Immediate;   Good Recent;   Good Remote;   Good  Judgement:  Good  Insight:  Good  Psychomotor Activity:  Normal  Concentration:  Concentration: Fair and Attention Span: Fair  Recall:  Good  Fund of Knowledge: Good  Language: Good  Akathisia:  No  Handed:  Right  AIMS (if indicated): not done  Assets:  Communication Skills Desire for Improvement Housing Resilience Talents/Skills Transportation  ADL's:  Intact  Cognition: WNL  Sleep:  Good   Screenings: PHQ2-9     Office Visit from 08/30/2015 in [place] Office Visit from 07/26/2015 in [place]  PHQ-2 Total Score  0  0       Assessment and Plan: Attention deficit disorder, inattentive type.  Major depressive disorder, recurrent.  Generalized anxiety disorder.  Patient doing better on  Klonopin but currently he is out of his medication for 1 week and feeling anxious.  Patient supposed to call us back to get refills on Klonopin.  He like to go back on Klonopin.  I will resume Klonopin 1 mg in the morning and half at bedtime.  Continue Abilify 10 mg daily, Lamictal 200 mg daily Wellbutrin XL 300 mg  daily and Ritalin 20 mg 4 times a day.  Patient has no rash, itching tremors or shakes.  Discussed stimulant and benzodiazepine abuse, withdrawal and tolerance.  Patient lost weight from the past.  Discussed healthy lifestyle and watch his calorie intake.  Patient is not interested in counseling.  Recommended to call us back if he has any question or any concern.  Follow-up in 3 months.   Kathlee Nations, MD 07/21/2017, 12:53 PM

## 2017-08-29 DIAGNOSIS — K219 Gastro-esophageal reflux disease without esophagitis: Secondary | ICD-10-CM | POA: Diagnosis not present

## 2017-08-29 DIAGNOSIS — K5732 Diverticulitis of large intestine without perforation or abscess without bleeding: Secondary | ICD-10-CM | POA: Diagnosis not present

## 2017-08-29 DIAGNOSIS — I10 Essential (primary) hypertension: Secondary | ICD-10-CM | POA: Diagnosis not present

## 2017-09-13 ENCOUNTER — Other Ambulatory Visit (HOSPITAL_COMMUNITY): Payer: Self-pay | Admitting: Psychiatry

## 2017-09-13 DIAGNOSIS — F330 Major depressive disorder, recurrent, mild: Secondary | ICD-10-CM

## 2017-09-30 ENCOUNTER — Other Ambulatory Visit (HOSPITAL_COMMUNITY): Payer: Self-pay | Admitting: Psychiatry

## 2017-09-30 DIAGNOSIS — F330 Major depressive disorder, recurrent, mild: Secondary | ICD-10-CM

## 2017-10-22 ENCOUNTER — Ambulatory Visit (HOSPITAL_COMMUNITY): Payer: Self-pay | Admitting: Psychiatry

## 2017-11-02 ENCOUNTER — Other Ambulatory Visit (HOSPITAL_COMMUNITY): Payer: Self-pay | Admitting: Psychiatry

## 2017-11-02 DIAGNOSIS — F411 Generalized anxiety disorder: Secondary | ICD-10-CM

## 2017-11-02 DIAGNOSIS — F330 Major depressive disorder, recurrent, mild: Secondary | ICD-10-CM

## 2017-11-08 ENCOUNTER — Telehealth (HOSPITAL_COMMUNITY): Payer: Self-pay

## 2017-11-08 ENCOUNTER — Other Ambulatory Visit (HOSPITAL_COMMUNITY): Payer: Self-pay

## 2017-11-08 DIAGNOSIS — F330 Major depressive disorder, recurrent, mild: Secondary | ICD-10-CM

## 2017-11-08 DIAGNOSIS — F411 Generalized anxiety disorder: Secondary | ICD-10-CM

## 2017-11-08 DIAGNOSIS — F900 Attention-deficit hyperactivity disorder, predominantly inattentive type: Secondary | ICD-10-CM

## 2017-11-08 MED ORDER — BUPROPION HCL ER (XL) 300 MG PO TB24: 300.0000 mg | ORAL_TABLET | Freq: Every day | ORAL | 0 refills | Status: DC

## 2017-11-08 MED ORDER — CLONAZEPAM 1 MG PO TABS: ORAL_TABLET | ORAL | 2 refills | Status: DC

## 2017-11-08 MED ORDER — METHYLPHENIDATE HCL 20 MG PO TABS: 20.0000 mg | ORAL_TABLET | Freq: Four times a day (QID) | ORAL | 0 refills | Status: DC

## 2017-11-08 MED ORDER — LAMOTRIGINE 150 MG PO TABS: 150.0000 mg | ORAL_TABLET | Freq: Two times a day (BID) | ORAL | 0 refills | Status: DC

## 2017-11-08 MED ORDER — ARIPIPRAZOLE 10 MG PO TABS: 10.0000 mg | ORAL_TABLET | Freq: Every day | ORAL | 0 refills | Status: DC

## 2017-11-08 NOTE — Telephone Encounter (Signed)
[place] Patient:   Patient has a follow up in October and needs a refill on his Ritalin - I have sent in his other medications, patient uses [facility]. Please review and advise, thank you

## 2017-11-08 NOTE — Telephone Encounter (Signed)
Sent. Sending as coverage for Dr. Adele Schilder for refill

## 2017-12-17 ENCOUNTER — Ambulatory Visit (HOSPITAL_COMMUNITY): Payer: Self-pay | Admitting: Psychiatry

## 2017-12-22 ENCOUNTER — Other Ambulatory Visit (HOSPITAL_COMMUNITY): Payer: Self-pay | Admitting: Psychiatry

## 2017-12-22 ENCOUNTER — Telehealth (HOSPITAL_COMMUNITY): Payer: Self-pay

## 2017-12-22 DIAGNOSIS — F900 Attention-deficit hyperactivity disorder, predominantly inattentive type: Secondary | ICD-10-CM

## 2017-12-22 MED ORDER — METHYLPHENIDATE HCL 20 MG PO TABS: 20.0000 mg | ORAL_TABLET | Freq: Four times a day (QID) | ORAL | 0 refills | Status: DC

## 2017-12-22 NOTE — Telephone Encounter (Signed)
Patient is calling for a refill on his ritalin, patient would like to pick up the hard copy. He has a follow up later this month.

## 2017-12-29 ENCOUNTER — Ambulatory Visit (INDEPENDENT_AMBULATORY_CARE_PROVIDER_SITE_OTHER): Payer: 59 | Admitting: Psychiatry

## 2017-12-29 ENCOUNTER — Encounter (HOSPITAL_COMMUNITY): Payer: Self-pay | Admitting: Psychiatry

## 2017-12-29 VITALS — BP 140/70 | Ht 73.0 in | Wt 230.0 lb

## 2017-12-29 DIAGNOSIS — F900 Attention-deficit hyperactivity disorder, predominantly inattentive type: Secondary | ICD-10-CM

## 2017-12-29 DIAGNOSIS — F411 Generalized anxiety disorder: Secondary | ICD-10-CM | POA: Diagnosis not present

## 2017-12-29 DIAGNOSIS — F330 Major depressive disorder, recurrent, mild: Secondary | ICD-10-CM | POA: Diagnosis not present

## 2017-12-29 MED ORDER — BUPROPION HCL ER (XL) 300 MG PO TB24: 300.0000 mg | ORAL_TABLET | Freq: Every day | ORAL | 0 refills | Status: DC

## 2017-12-29 MED ORDER — BUSPIRONE HCL 5 MG PO TABS: 5.0000 mg | ORAL_TABLET | Freq: Two times a day (BID) | ORAL | 0 refills | Status: DC

## 2017-12-29 MED ORDER — ARIPIPRAZOLE 10 MG PO TABS: 10.0000 mg | ORAL_TABLET | Freq: Every day | ORAL | 0 refills | Status: DC

## 2017-12-29 MED ORDER — METHYLPHENIDATE HCL 20 MG PO TABS: 20.0000 mg | ORAL_TABLET | Freq: Four times a day (QID) | ORAL | 0 refills | Status: DC

## 2017-12-29 MED ORDER — LAMOTRIGINE 150 MG PO TABS: 150.0000 mg | ORAL_TABLET | Freq: Two times a day (BID) | ORAL | 0 refills | Status: DC

## 2017-12-29 NOTE — Progress Notes (Signed)
1 

## 2017-12-29 NOTE — Progress Notes (Signed)
[place] MD/PA/NP OP Progress Note  12/29/2017 1:20 PM Dakota Vang  MRN:  [id number]  Chief Complaint: I am happy my son start working again.  Today I have a root canal and I am in a lot of pain.  HPI: Patient came for his follow-up appointment.  He is not taking Klonopin because he ran out and realized that it did not help his anxiety.  Today he had a root canal and he is in a lot of pain and took hydrocodone prescribed by dentist.  Overall he is happy with his family situation as his son start working again.  His job is going well.  He is able to do multitasking and his attention and concentration is much better with the Ritalin.  He is sleeping good.  He denies any irritability, anger, mania or any psychosis.  His energy level is good.  He denies any major panic attack but admitted feel anxious and nervous about everything.  He is not interested in counseling.  He has no tremors, shakes or any EPS.  He does not drink or use any illegal substances.  Visit Diagnosis:    ICD-10-CM   1. Attention deficit hyperactivity disorder (ADHD), predominantly inattentive type F90.0 methylphenidate (RITALIN) 20 MG tablet    DISCONTINUED: methylphenidate (RITALIN) 20 MG tablet    DISCONTINUED: methylphenidate (RITALIN) 20 MG tablet  2. Major depressive disorder, recurrent episode, mild (HCC) F33.0 buPROPion (WELLBUTRIN XL) 300 MG 24 hr tablet    ARIPiprazole (ABILIFY) 10 MG tablet    lamoTRIgine (LAMICTAL) 150 MG tablet  3. Generalized anxiety disorder F41.1 busPIRone (BUSPAR) 5 MG tablet    Past Psychiatric History: Reviewed. Patient has taken Wellbutrin in the past with good response. He also had tried Prozac for many years prescribed by primary care physician. We have tried him on Cymbalta and Lexaprobut it did not helped. Patient denies any history of psychiatric inpatient treatment.  Past Medical History:  Past Medical History:  Diagnosis Date  . ADD (attention deficit disorder with hyperactivity)     Dr. Donalee Citrin  . Anxiety    Dr. Donalee Citrin  . Colon polyps 2008   Dr Fuller Plan  . COLONIC POLYPS, HX OF 11/20/2007   Qualifier: Diagnosis of  By: Linna Darner MD, Gwyndolyn Saxon   Last colonoscopy negative 2013 PMH of plyps X 2; Dr Fuller Plan, GI    . Diverticulitis    PMH of  . Diverticulitis of jejunum with microperforation s/p lap resection [id number] 08/28/2012   Qualifier: Diagnosis of  By: Linna Darner MD, Gwyndolyn Saxon   6/1-3/14 [facility] ,[place] : ? Jejunal perforation F/U CT recommended 08/24/12   . Diverticulitis of sigmoid colon 06/24/2008   2005 by CT scan    . Diverticulosis 2002   Dr Velora Heckler  . Duodenal diverticulum 08/28/2012  . GERD (gastroesophageal reflux disease)   . Hyperlipidemia   . Hypertension   . Pancolonic diverticulosis 09/09/2012    Past Surgical History:  Procedure Laterality Date  . COLON SURGERY    . COLONOSCOPY  2013   Dr Fuller Plan  . COLONOSCOPY W/ POLYPECTOMY  2002 & 2008   Dr. Fuller Plan; polyps X 2  . LAPAROSCOPIC PARTIAL COLECTOMY N/A 09/24/2012   Procedure: laparoscopic assisted  resection of proximal jejunum containing the jejunal diverticulum;  Surgeon: [name], MD;  Location: [facility];  Service: General;  Laterality: N/A;  . LAPAROSCOPIC SMALL BOWEL RESECTION  09/24/2012   excision of proximal jejunum for jejunal diverticulitis  . LASIK  bilaterally  . TONSILLECTOMY      Family Psychiatric History: Reviewed  Family History:  Family History  Problem Relation Age of Onset  . Colon cancer Father        in 76s  . Lung cancer Father        smoker; asbestos exposure  . Cancer Father        Mesothelioma  . Coronary artery disease Mother        CAGB X 2 & angio 3-4X  . Cancer Mother        Gallbladder  . Coronary artery disease Maternal Grandmother   . COPD Maternal Grandmother   . Heart attack Maternal Grandfather        late 60's  . Heart attack Paternal Grandfather 48  . Diabetes Paternal Uncle        Dialysis  . Stroke Neg Hx     Social  History:  Social History   Socioeconomic History  . Marital status: Married    Spouse name: Not on file  . Number of children: Not on file  . Years of education: Not on file  . Highest education level: Not on file  Occupational History  . Not on file  Social Needs  . Financial resource strain: Not on file  . Food insecurity:    Worry: Not on file    Inability: Not on file  . Transportation needs:    Medical: Not on file    Non-medical: Not on file  Tobacco Use  . Smoking status: Current Some Day Smoker    Packs/day: 0.25    Types: Cigarettes  . Smokeless tobacco: Never Used  . Tobacco comment: smoked 1988-present ,up to 1 ppd. 08/22/11 averaging 2-3 /day  Substance and Sexual Activity  . Alcohol use: No  . Drug use: No  . Sexual activity: Yes  Lifestyle  . Physical activity:    Days per week: Not on file    Minutes per session: Not on file  . Stress: Not on file  Relationships  . Social connections:    Talks on phone: Not on file    Gets together: Not on file    Attends religious service: Not on file    Active member of club or organization: Not on file    Attends meetings of clubs or organizations: Not on file    Relationship status: Not on file  Other Topics Concern  . Not on file  Social History Narrative  . Not on file    Allergies:  Allergies  Allergen Reactions  . Penicillins Other (See Comments)    Reaction:  Unknown  Has patient had a PCN reaction causing immediate rash, facial/tongue/throat swelling, SOB or lightheadedness with hypotension: Unsure Has patient had a PCN reaction causing severe rash involving mucus membranes or skin necrosis: Unsure Has patient had a PCN reaction that required hospitalization Unsure Has patient had a PCN reaction occurring within the last 10 years: No If all of the above answers are "NO", then may proceed with Cephalosporin use.    Metabolic Disorder Labs: Lab Results  Component Value Date   HGBA1C 5.7 08/26/2014    No results found for: PROLACTIN Lab Results  Component Value Date   CHOL 166 07/26/2015   TRIG 250.0 (H) 07/26/2015   HDL 30.80 (L) 07/26/2015   CHOLHDL 5 07/26/2015   VLDL 50.0 (H) 07/26/2015   LDLCALC 88 08/22/2014   LDLCALC 101 (H) 08/18/2013   Lab Results  Component Value Date  TSH 2.52 07/26/2015   TSH 1.64 08/22/2014    Therapeutic Level Labs: No results found for: LITHIUM No results found for: VALPROATE No components found for:  CBMZ  Current Medications: Current Outpatient Medications  Medication Sig Dispense Refill  . amLODipine (NORVASC) 5 MG tablet Take 5 mg by mouth daily.    . ARIPiprazole (ABILIFY) 10 MG tablet Take 1 tablet (10 mg total) by mouth daily. 90 tablet 0  . aspirin EC 81 MG tablet Take 81 mg by mouth daily.     Marland Kitchen buPROPion (WELLBUTRIN XL) 300 MG 24 hr tablet Take 1 tablet (300 mg total) by mouth daily. 90 tablet 0  . clindamycin (CLEOCIN T) 1 % external solution Apply 1 application topically as needed (for breakouts on neck).     . fluticasone (FLONASE) 50 MCG/ACT nasal spray Place 2 sprays into both nostrils daily as needed for rhinitis.    Marland Kitchen lamoTRIgine (LAMICTAL) 150 MG tablet Take 1 tablet (150 mg total) by mouth 2 (two) times daily. 180 tablet 0  . methylphenidate (RITALIN) 20 MG tablet Take 1 tablet (20 mg total) by mouth 4 (four) times daily. 120 tablet 0  . omeprazole (PRILOSEC) 20 MG capsule Take 20 mg by mouth daily.    . ramipril (ALTACE) 10 MG capsule Take 10 mg by mouth daily.    . rosuvastatin (CRESTOR) 10 MG tablet Take 1 tablet (10 mg total) by mouth daily. 90 tablet 1  . clonazePAM (KLONOPIN) 1 MG tablet Take 1/2 to 1 tab in am and 1/2 at noon (Patient not taking: Reported on 12/29/2017) 45 tablet 2   No current facility-administered medications for this visit.      Musculoskeletal: Strength & Muscle Tone: within normal limits Gait & Station: normal Patient leans: N/A  Psychiatric Specialty Exam: ROS  Blood pressure  140/70, height 6\' 1"  (1.854 m), weight 230 lb (104.3 kg).Body mass index is 30.34 kg/m.  General Appearance: Casual  Eye Contact:  Good  Speech:  Clear and Coherent  Volume:  Normal  Mood:  Anxious  Affect:  Congruent  Thought Process:  Goal Directed  Orientation:  Full (Time, Place, and Person)  Thought Content: Logical   Suicidal Thoughts:  No  Homicidal Thoughts:  No  Memory:  Immediate;   Good Recent;   Good Remote;   Good  Judgement:  Good  Insight:  Good  Psychomotor Activity:  Normal  Concentration:  Concentration: Fair and Attention Span: Fair  Recall:  Good  Fund of Knowledge: Good  Language: Good  Akathisia:  No  Handed:  Right  AIMS (if indicated): not done  Assets:  Communication Skills Desire for [place] Talents/Skills Transportation  ADL's:  Intact  Cognition: WNL  Sleep:  Good   Screenings: PHQ2-9     Office Visit from 08/30/2015 in [place] Office Visit from 07/26/2015 in [place]  PHQ-2 Total Score  0  0       Assessment and Plan: Attention deficit disorder, inattentive type.  Major depressive disorder, recurrent.  Generalized anxiety disorder.  Patient is no longer taking Klonopin as he ran out and he does not see any changes without Klonopin.  He still feel anxious.  I recommended to try BuSpar which she had never tried before.  We will try BuSpar 5 mg twice a day and we will send the prescription to the local pharmacy and I asked him to call us back if he  feels better with the BuSpar.  I will discontinue Klonopin.  Continue Lamictal 200 mg daily, Wellbutrin XL 300 mg daily, Abilify 10 mg daily and Ritalin 20 mg 4 times a day.  We discussed stimulant abuse, withdrawal and tolerance.  Discussed healthy lifestyle and watch his calorie intake.  Patient is not interested in counseling.  Recommended to call us back if is any question,  concern if he feels worsening of the symptoms.  Follow-up in 3 months.   Kathlee Nations, MD 12/29/2017, 1:20 PM

## 2017-12-30 IMAGING — CT CT ABD-PELV W/ CM
2 of 5 series · 16 of 46 positions shown, 18 images · IV contrast (ISOVUE)
Comparison: 04/02/2013

CLINICAL DATA: c/o lower mid chest and LUQ upper abdominal pain
intermittently onset yesterday and is now consistent today. Pt
reports diarrhea onset a couple days ago 100 ml isovue 300^1
[facility] IOPAMIDOL ([facility]) INJECTION 61%

EXAM:
CT ABDOMEN AND PELVIS WITH CONTRAST
TECHNIQUE: Multidetector CT imaging of the abdomen and pelvis was performed
using the standard protocol following bolus administration of
intravenous contrast.
CONTRAST:  1 [facility] IOPAMIDOL ([facility]) INJECTION 61%

[Series 2: abd/pel with · axial · 0.88mm/px · z∈[+719,+1174]mm · 13 of 107 slices shown, 15 images]
[im 8/107  soft-tissue]
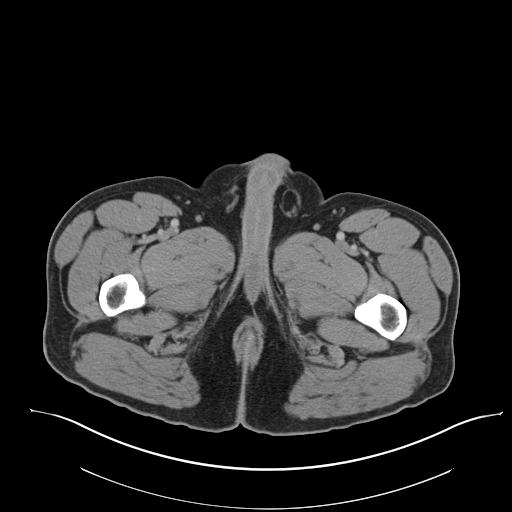
[im 8/107  bone]
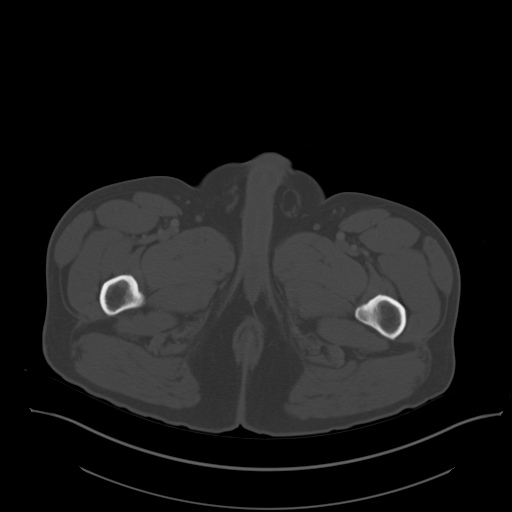
[im 15/107  soft-tissue]
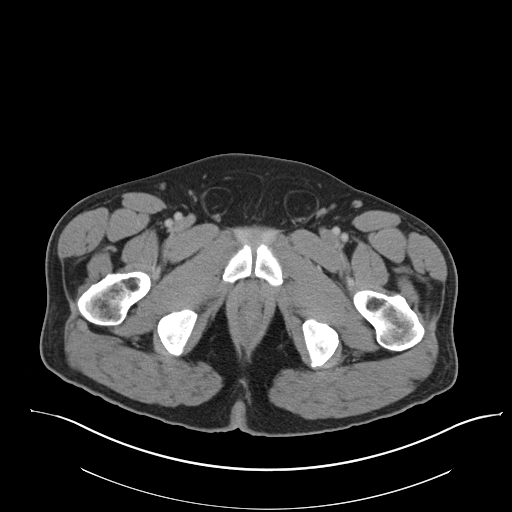
[im 22/107  soft-tissue]
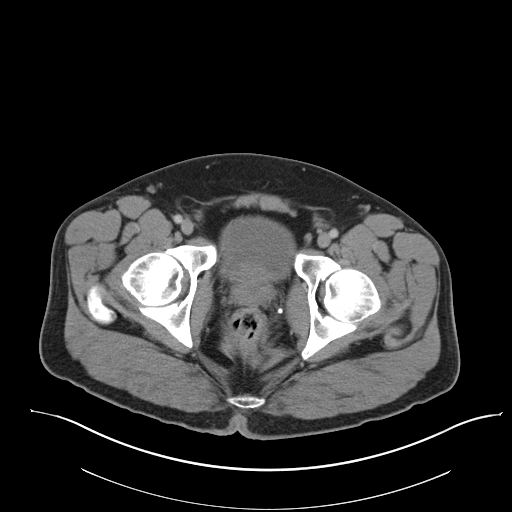
[im 29/107  soft-tissue]
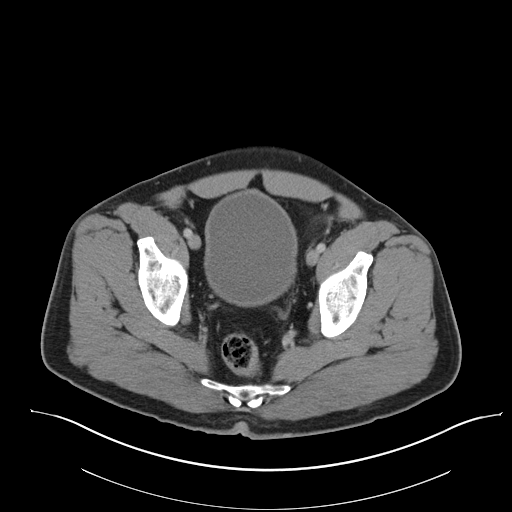
[im 36/107  soft-tissue]
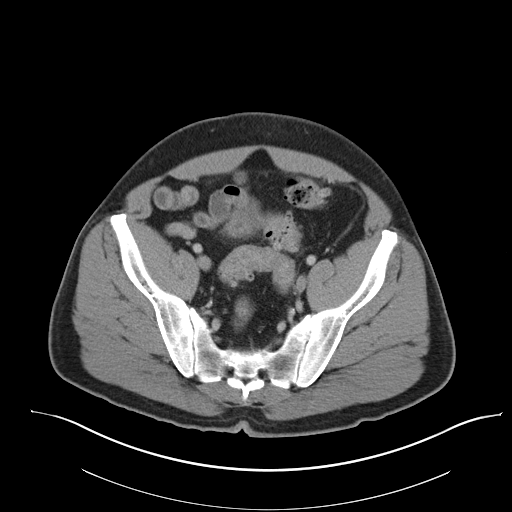
[im 43/107  soft-tissue]
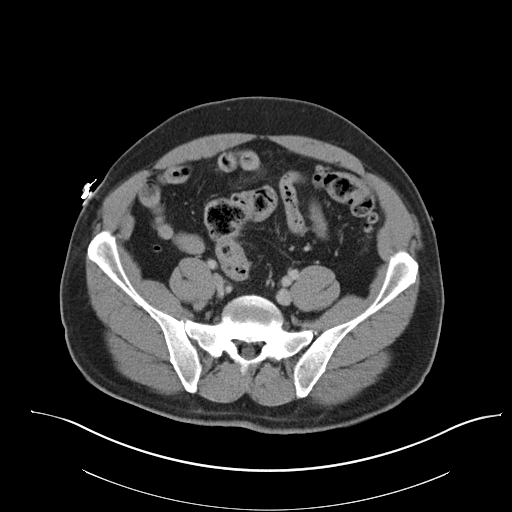
[im 57/107  soft-tissue]
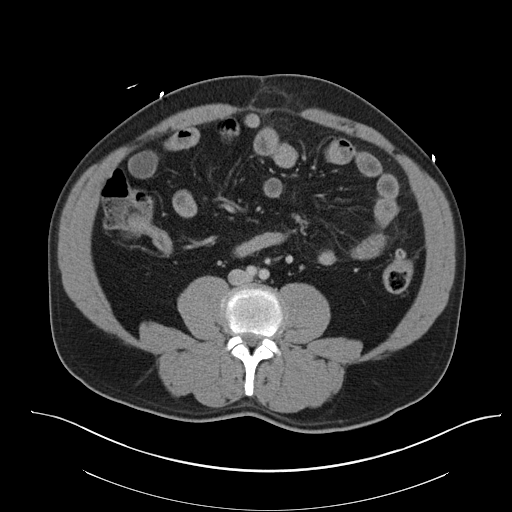
[im 64/107  soft-tissue]
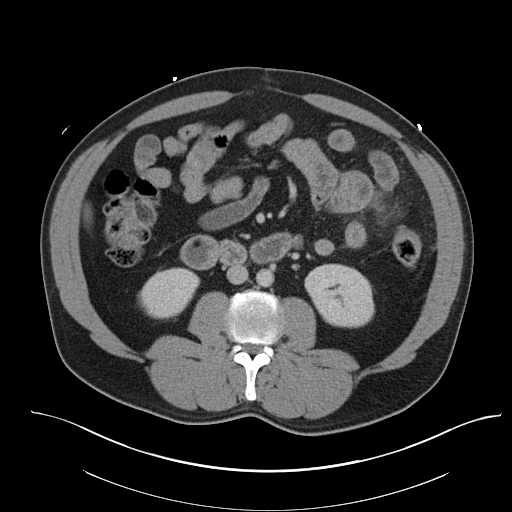
[im 71/107  soft-tissue]
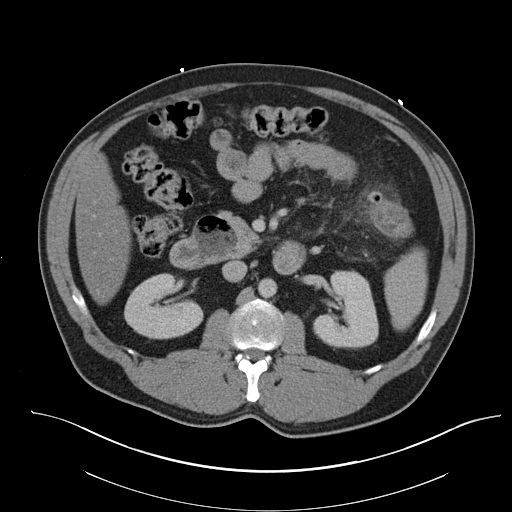
[im 71/107  bone]
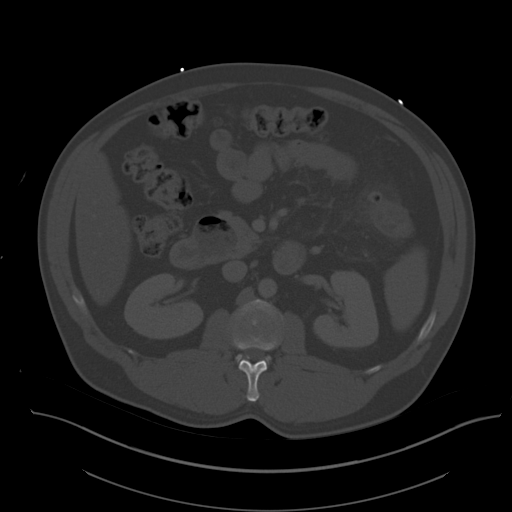
[im 78/107  soft-tissue]
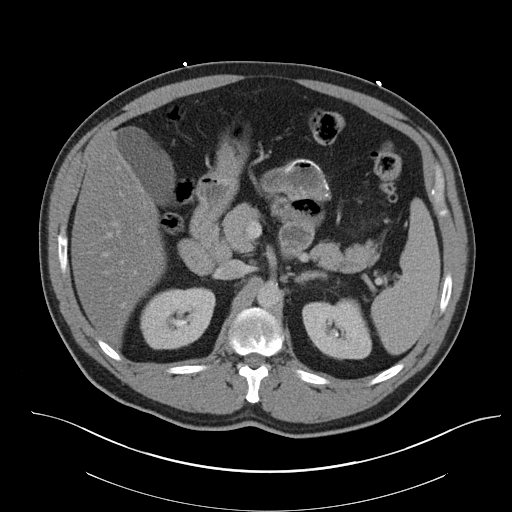
[im 85/107  soft-tissue]
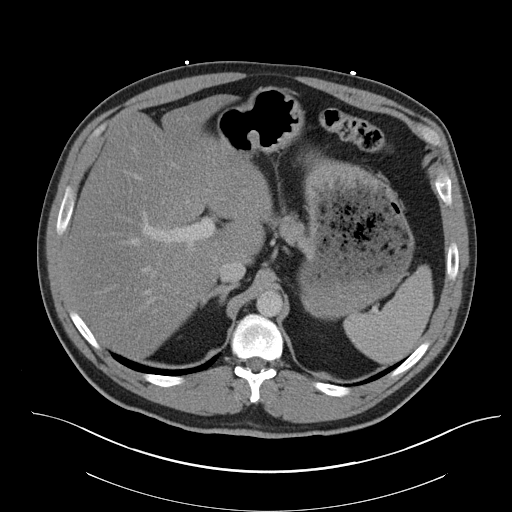
[im 92/107  soft-tissue]
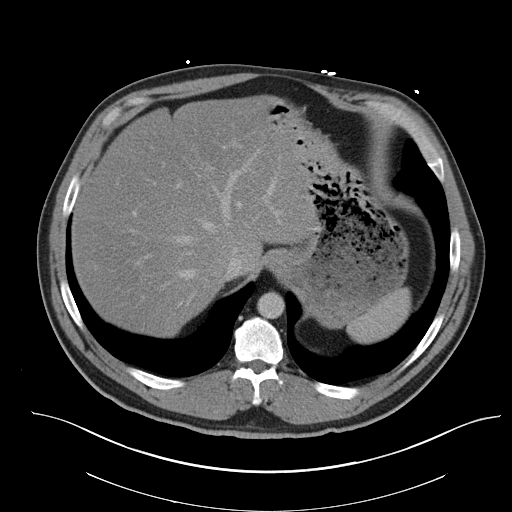
[im 99/107  soft-tissue]
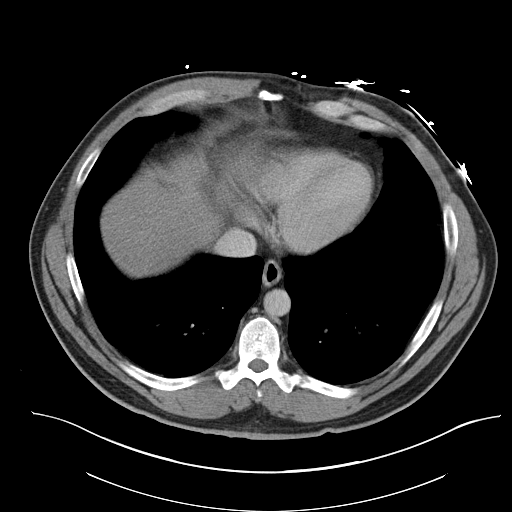

[Series 3: coronal a/|p · coronal · 0.79mm/px · 3 of 166 slices shown]
[im 56/166  soft-tissue]
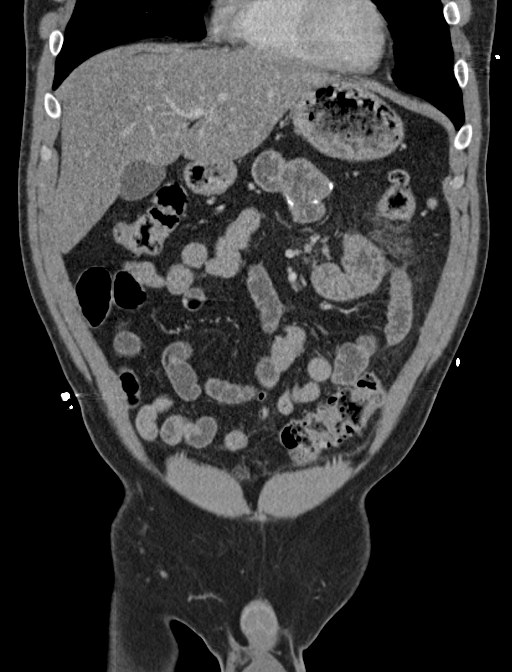
[im 74/166  soft-tissue]
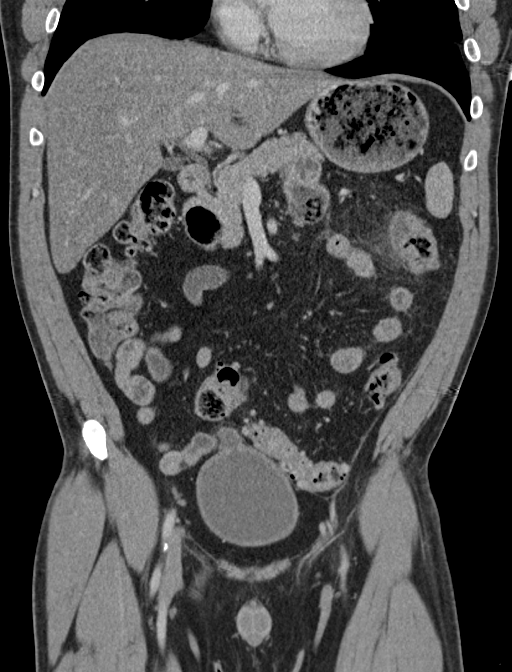
[im 92/166  soft-tissue]
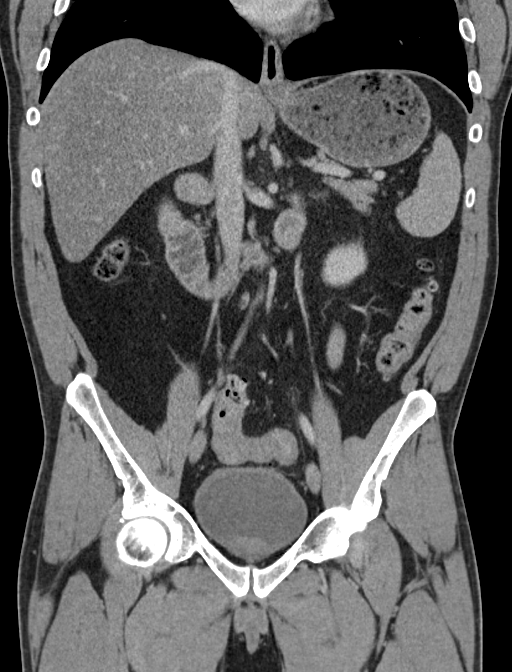

[16 of 46 positions shown; findings below may reference images not displayed]

FINDINGS: Lower chest: No acute abnormality.

Hepatobiliary: There is diffuse low-attenuation of the liver. No
focal liver lesions are identified. Gallbladder is present and
normal in CT appearance.

Pancreas: Unremarkable. No pancreatic ductal dilatation or
surrounding inflammatory changes.

Spleen: Normal in size without focal abnormality.

Adrenals/Urinary Tract: The adrenal glands are normal in appearance.
No hydronephrosis.

Stomach/Bowel: The stomach has a normal appearance. Small bowel
loops are normal in appearance. Unremarkable appearance of a small
bowel to small bowel anastomosis in the mid abdomen. The appendix is
well seen and has a normal appearance.

There are extensive diverticula throughout the colon. There is
inflammatory change in the fat surrounding the splenic flexure in
this segment with numerous diverticula. The findings are consistent
with acute diverticulitis. No associated perforation or abscess.

Vascular/Lymphatic: There is atherosclerotic calcification of the
abdominal aorta. No aneurysm. No retroperitoneal or mesenteric
adenopathy.

Reproductive: Uterus and bilateral adnexa are unremarkable.

Other: Fat containing bilateral inguinal hernias.  No ascites.

Musculoskeletal: No acute or significant osseous findings.
IMPRESSION: 1. Acute diverticulitis involving the splenic flexure of the colon.
No associated abscess or perforation.
2. Hepatic steatosis.

## 2017-12-30 IMAGING — CR DG CHEST 2V
2 series · 2 of 2 positions shown · non-contrast
Comparison: 09/21/2012

CLINICAL DATA: Lower mid chest and upper abdominal pain

EXAM:
CHEST  2 VIEW

[w chest pa]
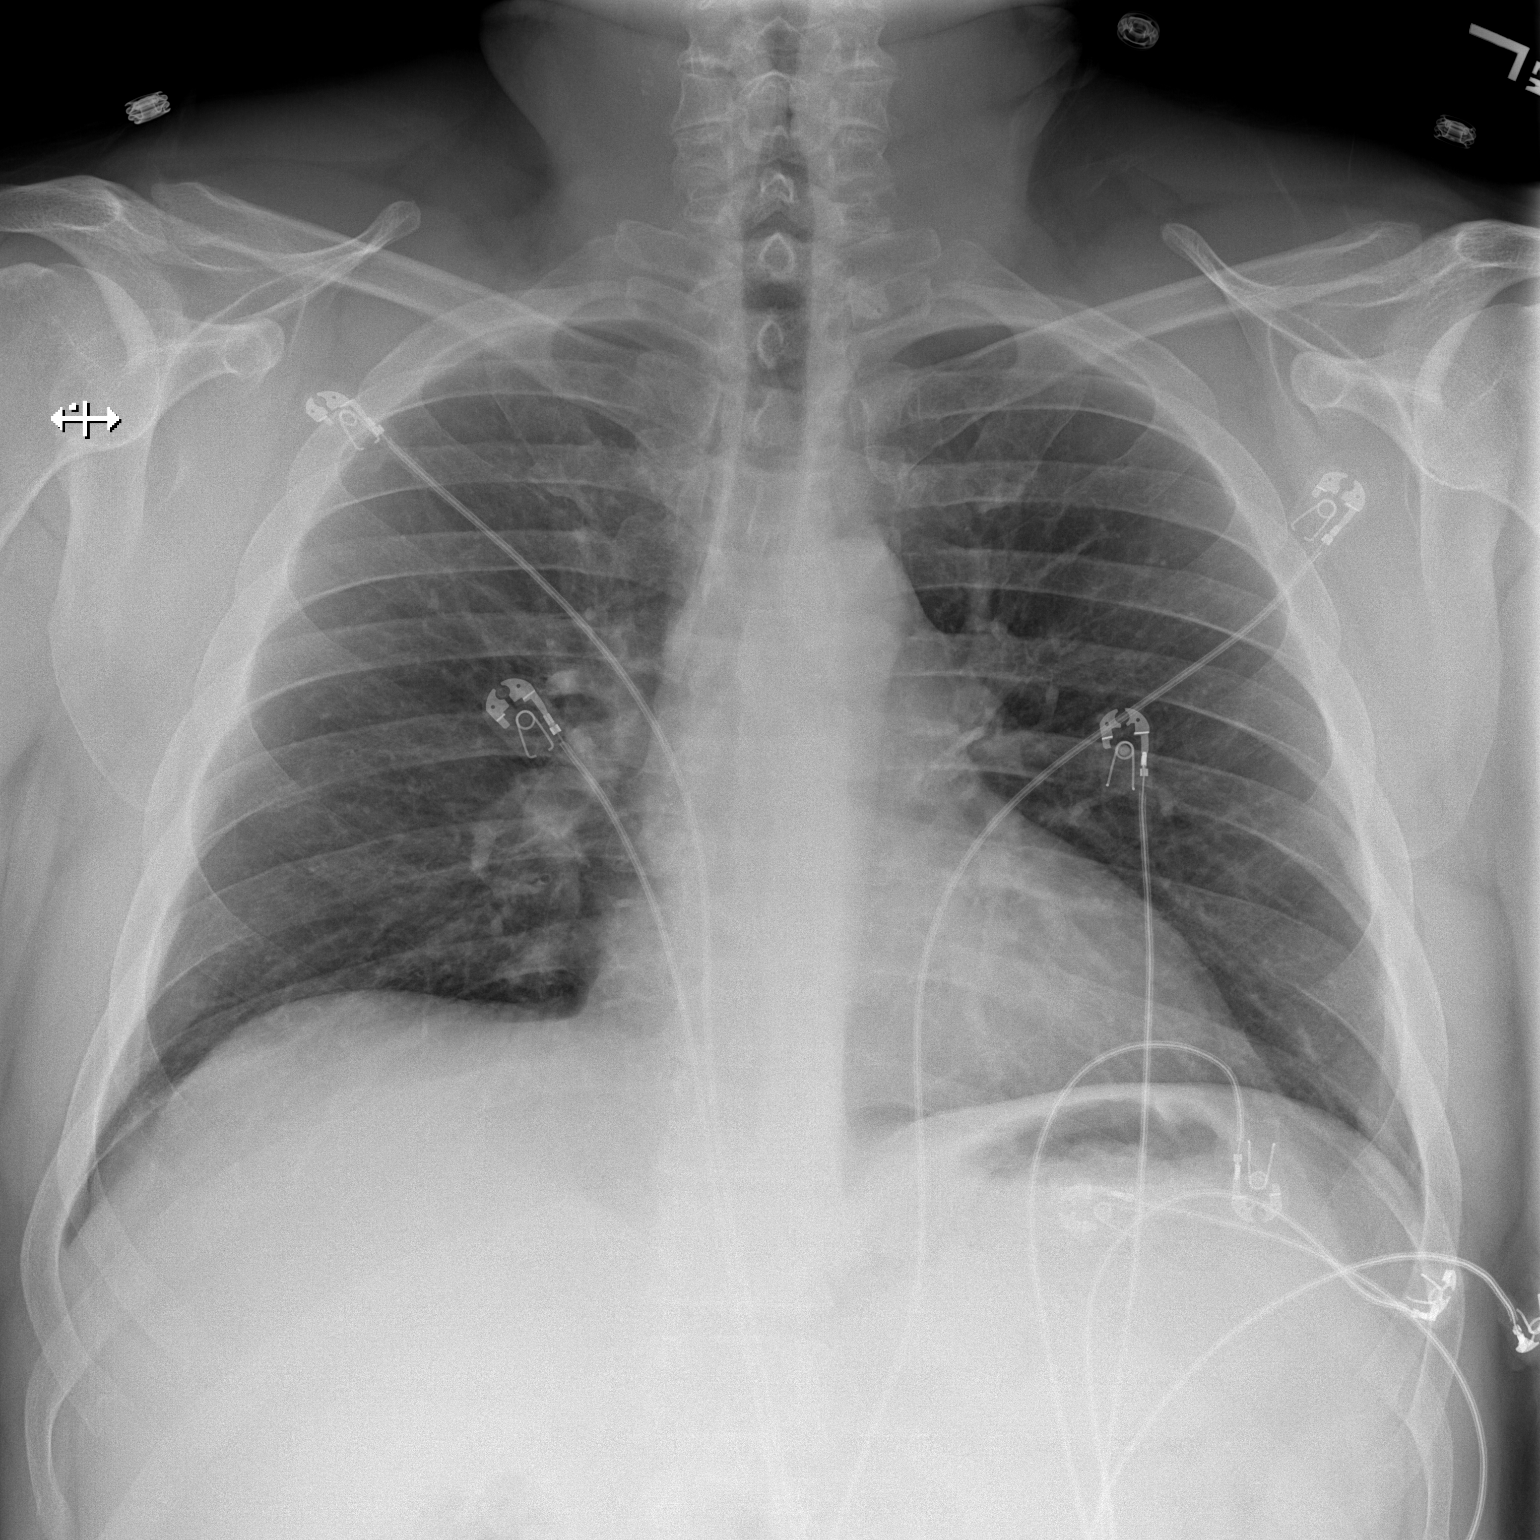

[w chest lat]
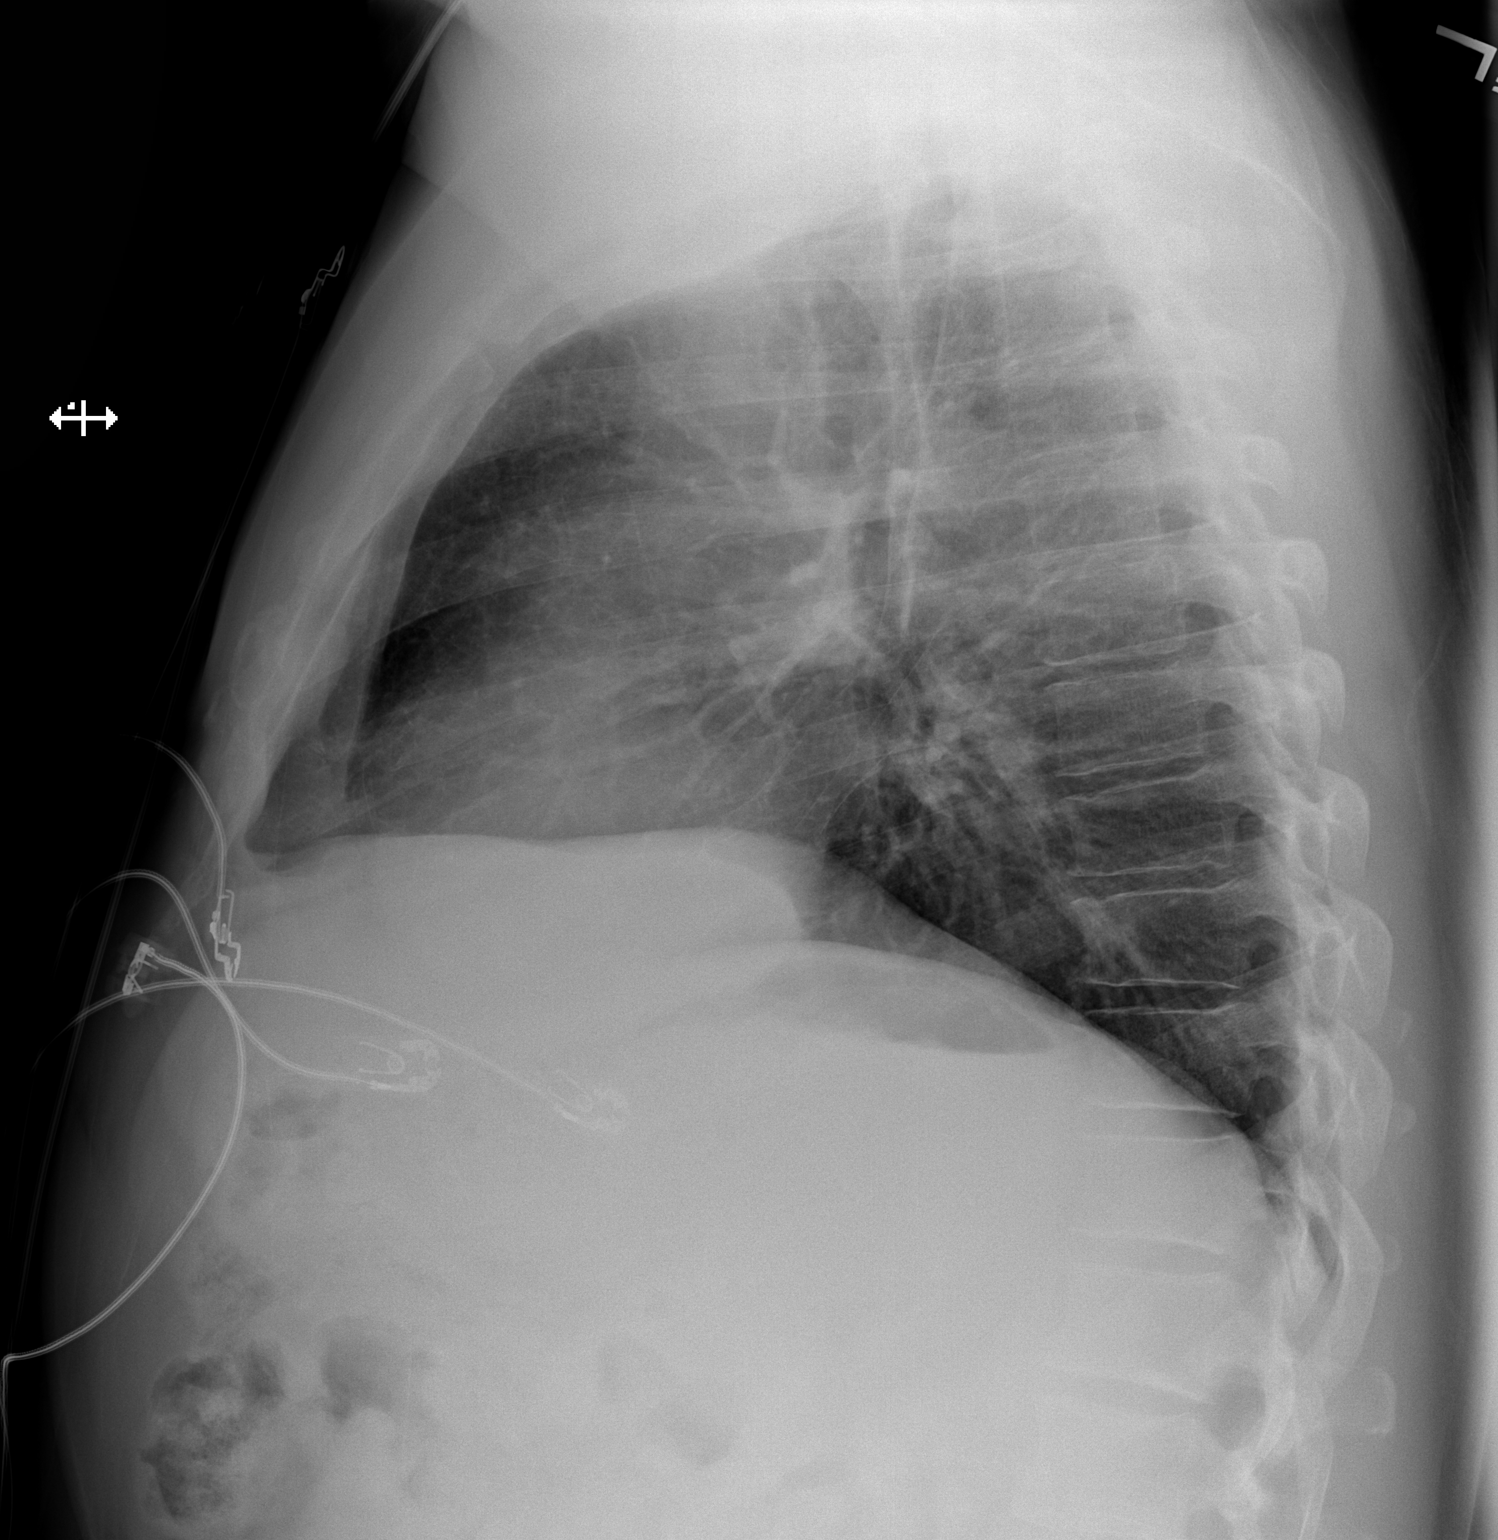

[2 of 2 positions shown; findings below may reference images not displayed]

FINDINGS: The heart size and mediastinal contours are within normal limits.
Both lungs are clear. The visualized skeletal structures are
unremarkable.
IMPRESSION: No active cardiopulmonary disease.

## 2018-01-18 ENCOUNTER — Other Ambulatory Visit (HOSPITAL_COMMUNITY): Payer: Self-pay | Admitting: Psychiatry

## 2018-01-18 DIAGNOSIS — F411 Generalized anxiety disorder: Secondary | ICD-10-CM

## 2018-01-19 DIAGNOSIS — Z23 Encounter for immunization: Secondary | ICD-10-CM | POA: Diagnosis not present

## 2018-01-19 DIAGNOSIS — I10 Essential (primary) hypertension: Secondary | ICD-10-CM | POA: Diagnosis not present

## 2018-01-19 DIAGNOSIS — E7800 Pure hypercholesterolemia, unspecified: Secondary | ICD-10-CM | POA: Diagnosis not present

## 2018-03-18 ENCOUNTER — Other Ambulatory Visit (HOSPITAL_COMMUNITY): Payer: Self-pay | Admitting: Psychiatry

## 2018-03-18 DIAGNOSIS — F411 Generalized anxiety disorder: Secondary | ICD-10-CM

## 2018-03-23 ENCOUNTER — Other Ambulatory Visit (HOSPITAL_COMMUNITY): Payer: Self-pay | Admitting: Psychiatry

## 2018-03-23 DIAGNOSIS — F330 Major depressive disorder, recurrent, mild: Secondary | ICD-10-CM

## 2018-03-28 ENCOUNTER — Other Ambulatory Visit (HOSPITAL_COMMUNITY): Payer: Self-pay | Admitting: Psychiatry

## 2018-03-28 DIAGNOSIS — F330 Major depressive disorder, recurrent, mild: Secondary | ICD-10-CM

## 2018-03-30 ENCOUNTER — Ambulatory Visit (INDEPENDENT_AMBULATORY_CARE_PROVIDER_SITE_OTHER): Payer: 59 | Admitting: Psychiatry

## 2018-03-30 VITALS — BP 130/78 | Ht 73.0 in | Wt 239.0 lb

## 2018-03-30 DIAGNOSIS — F330 Major depressive disorder, recurrent, mild: Secondary | ICD-10-CM | POA: Diagnosis not present

## 2018-03-30 DIAGNOSIS — F411 Generalized anxiety disorder: Secondary | ICD-10-CM | POA: Diagnosis not present

## 2018-03-30 DIAGNOSIS — F900 Attention-deficit hyperactivity disorder, predominantly inattentive type: Secondary | ICD-10-CM | POA: Diagnosis not present

## 2018-03-30 MED ORDER — LAMOTRIGINE 150 MG PO TABS: 150.0000 mg | ORAL_TABLET | Freq: Two times a day (BID) | ORAL | 0 refills | Status: DC

## 2018-03-30 MED ORDER — METHYLPHENIDATE HCL 20 MG PO TABS: 20.0000 mg | ORAL_TABLET | Freq: Four times a day (QID) | ORAL | 0 refills | Status: DC

## 2018-03-30 MED ORDER — ARIPIPRAZOLE 10 MG PO TABS: 10.0000 mg | ORAL_TABLET | Freq: Every day | ORAL | 0 refills | Status: DC

## 2018-03-30 MED ORDER — BUPROPION HCL ER (XL) 300 MG PO TB24: 300.0000 mg | ORAL_TABLET | Freq: Every day | ORAL | 0 refills | Status: DC

## 2018-03-30 NOTE — Progress Notes (Signed)
[place] MD/PA/NP OP Progress Note  03/30/2018 2:43 PM Dakota Vang  MRN:  [id number]  Chief Complaint: I stop taking BuSpar.  I did not see any improvement.  Anxiety is so-so but my other medicines are working very well.  HPI: Dakota Vang came for his follow-up appointment.  On his last visit we started him on BuSpar as patient was complaining of anxiety.  He was taking Klonopin which she stopped because it was not working and he like to try something else.  Did not see any improvement with BuSpar either and he stopped taking it.  Otherwise he is compliant with other medication which is working very well for his depression, ADD symptoms.  We talked about lowering the dose of Ritalin since it may cause also anxiety but patient does not want to change his ADD medication because it helps his focus, attention and multitasking.  He endorsed that his anxiety is not as bad and is not interested to try any other medication.  He denies any panic attack.  He denies any irritability, anger, mania or any psychosis.  His job is going very well.  He had a good Christmas.  His attention and concentration is good.  He has no tremors, shakes or any EPS.  He denies drinking or using any illegal substances.  He is seeing primary care physician Dr. Deforest Hoyles for his physical and blood work.  He lives with his wife and 3 teenage boys.  Visit Diagnosis:    ICD-10-CM   1. Generalized anxiety disorder F41.1 buPROPion (WELLBUTRIN XL) 300 MG 24 hr tablet  2. Attention deficit hyperactivity disorder (ADHD), predominantly inattentive type F90.0 methylphenidate (RITALIN) 20 MG tablet    DISCONTINUED: methylphenidate (RITALIN) 20 MG tablet    DISCONTINUED: methylphenidate (RITALIN) 20 MG tablet  3. Major depressive disorder, recurrent episode, mild (HCC) F33.0 buPROPion (WELLBUTRIN XL) 300 MG 24 hr tablet    ARIPiprazole (ABILIFY) 10 MG tablet    lamoTRIgine (LAMICTAL) 150 MG tablet    Past Psychiatric History: Reviewed. No history  of psychiatric inpatient treatment or any suicidal attempt.  Tried Prozac for many years until it stopped working.  We tried Cymbalta, Klonopin, BuSpar and Lexapro but that did not help.  History of ADD.  No history of paranoia, hallucination or psychosis.  Past Medical History:  Past Medical History:  Diagnosis Date  . ADD (attention deficit disorder with hyperactivity)    Dr. Donalee Citrin  . Anxiety    Dr. Donalee Citrin  . Colon polyps 2008   Dr Fuller Plan  . COLONIC POLYPS, HX OF 11/20/2007   Qualifier: Diagnosis of  By: Linna Darner MD, Gwyndolyn Saxon   Last colonoscopy negative 2013 PMH of plyps X 2; Dr Fuller Plan, GI    . Diverticulitis    PMH of  . Diverticulitis of jejunum with microperforation s/p lap resection [id number] 08/28/2012   Qualifier: Diagnosis of  By: Linna Darner MD, Gwyndolyn Saxon   6/1-3/14 [facility] ,[place] : ? Jejunal perforation F/U CT recommended 08/24/12   . Diverticulitis of sigmoid colon 06/24/2008   2005 by CT scan    . Diverticulosis 2002   Dr Velora Heckler  . Duodenal diverticulum 08/28/2012  . GERD (gastroesophageal reflux disease)   . Hyperlipidemia   . Hypertension   . Pancolonic diverticulosis 09/09/2012    Past Surgical History:  Procedure Laterality Date  . COLON SURGERY    . COLONOSCOPY  2013   Dr Fuller Plan  . COLONOSCOPY W/ POLYPECTOMY  2002 & 2008   Dr. Fuller Plan; polyps  X 2  . LAPAROSCOPIC PARTIAL COLECTOMY N/A 09/24/2012   Procedure: laparoscopic assisted  resection of proximal jejunum containing the jejunal diverticulum;  Surgeon: [name], MD;  Location: [facility];  Service: General;  Laterality: N/A;  . LAPAROSCOPIC SMALL BOWEL RESECTION  09/24/2012   excision of proximal jejunum for jejunal diverticulitis  . LASIK     bilaterally  . TONSILLECTOMY      Family Psychiatric History: Reviewed.  Family History:  Family History  Problem Relation Age of Onset  . Colon cancer Father        in 75s  . Lung cancer Father        smoker; asbestos exposure  . Cancer Father         Mesothelioma  . Coronary artery disease Mother        CAGB X 2 & angio 3-4X  . Cancer Mother        Gallbladder  . Coronary artery disease Maternal Grandmother   . COPD Maternal Grandmother   . Heart attack Maternal Grandfather        late 60's  . Heart attack Paternal Grandfather 5  . Diabetes Paternal Uncle        Dialysis  . Stroke Neg Hx     Social History:  Social History   Socioeconomic History  . Marital status: Married    Spouse name: Not on file  . Number of children: Not on file  . Years of education: Not on file  . Highest education level: Not on file  Occupational History  . Not on file  Social Needs  . Financial resource strain: Not on file  . Food insecurity:    Worry: Not on file    Inability: Not on file  . Transportation needs:    Medical: Not on file    Non-medical: Not on file  Tobacco Use  . Smoking status: Current Some Day Smoker    Packs/day: 0.25    Types: Cigarettes  . Smokeless tobacco: Never Used  . Tobacco comment: smoked 1988-present ,up to 1 ppd. 08/22/11 averaging 2-3 /day  Substance and Sexual Activity  . Alcohol use: No  . Drug use: No  . Sexual activity: Yes  Lifestyle  . Physical activity:    Days per week: Not on file    Minutes per session: Not on file  . Stress: Not on file  Relationships  . Social connections:    Talks on phone: Not on file    Gets together: Not on file    Attends religious service: Not on file    Active member of club or organization: Not on file    Attends meetings of clubs or organizations: Not on file    Relationship status: Not on file  Other Topics Concern  . Not on file  Social History Narrative  . Not on file    Allergies:  Allergies  Allergen Reactions  . Penicillins Other (See Comments)    Reaction:  Unknown  Has patient had a PCN reaction causing immediate rash, facial/tongue/throat swelling, SOB or lightheadedness with hypotension: Unsure Has patient had a PCN reaction causing  severe rash involving mucus membranes or skin necrosis: Unsure Has patient had a PCN reaction that required hospitalization Unsure Has patient had a PCN reaction occurring within the last 10 years: No If all of the above answers are "NO", then may proceed with Cephalosporin use.    Metabolic Disorder Labs: Lab Results  Component Value Date  HGBA1C 5.7 08/26/2014   No results found for: PROLACTIN Lab Results  Component Value Date   CHOL 166 07/26/2015   TRIG 250.0 (H) 07/26/2015   HDL 30.80 (L) 07/26/2015   CHOLHDL 5 07/26/2015   VLDL 50.0 (H) 07/26/2015   LDLCALC 88 08/22/2014   LDLCALC 101 (H) 08/18/2013   Lab Results  Component Value Date   TSH 2.52 07/26/2015   TSH 1.64 08/22/2014    Therapeutic Level Labs: No results found for: LITHIUM No results found for: VALPROATE No components found for:  CBMZ  Current Medications: Current Outpatient Medications  Medication Sig Dispense Refill  . amLODipine (NORVASC) 5 MG tablet Take 5 mg by mouth daily.    . ARIPiprazole (ABILIFY) 10 MG tablet Take 1 tablet (10 mg total) by mouth daily. 90 tablet 0  . aspirin EC 81 MG tablet Take 81 mg by mouth daily.     Marland Kitchen buPROPion (WELLBUTRIN XL) 300 MG 24 hr tablet Take 1 tablet (300 mg total) by mouth daily. 90 tablet 0  . busPIRone (BUSPAR) 5 MG tablet TAKE 1 TABLET BY MOUTH TWICE A DAY 60 tablet 1  . clindamycin (CLEOCIN T) 1 % external solution Apply 1 application topically as needed (for breakouts on neck).     . fluticasone (FLONASE) 50 MCG/ACT nasal spray Place 2 sprays into both nostrils daily as needed for rhinitis.    Marland Kitchen lamoTRIgine (LAMICTAL) 150 MG tablet Take 1 tablet (150 mg total) by mouth 2 (two) times daily. 180 tablet 0  . methylphenidate (RITALIN) 20 MG tablet Take 1 tablet (20 mg total) by mouth 4 (four) times daily. 120 tablet 0  . omeprazole (PRILOSEC) 20 MG capsule Take 20 mg by mouth daily.    . ramipril (ALTACE) 10 MG capsule Take 10 mg by mouth daily.    .  rosuvastatin (CRESTOR) 10 MG tablet Take 1 tablet (10 mg total) by mouth daily. 90 tablet 1   No current facility-administered medications for this visit.      Musculoskeletal: Strength & Muscle Tone: within normal limits Gait & Station: normal Patient leans: N/A  Psychiatric Specialty Exam: Review of Systems  Cardiovascular: Negative for chest pain and palpitations.  Skin: Negative.  Negative for itching and rash.  Neurological: Negative for tingling.  Psychiatric/Behavioral: Negative for depression and hallucinations.    Blood pressure 130/78, height 6\' 1"  (1.854 m), weight 239 lb (108.4 kg).There is no height or weight on file to calculate BMI.  General Appearance: Fairly Groomed and Wearing work uniform.  Eye Contact:  Good  Speech:  Clear and Coherent  Volume:  Normal  Mood:  Euthymic  Affect:  Appropriate  Thought Process:  Goal Directed  Orientation:  Full (Time, Place, and Person)  Thought Content: Logical   Suicidal Thoughts:  No  Homicidal Thoughts:  No  Memory:  Immediate;   Good Recent;   Good Remote;   Good  Judgement:  Good  Insight:  Good  Psychomotor Activity:  Normal  Concentration:  Concentration: Good and Attention Span: Good  Recall:  Good  Fund of Knowledge: Good  Language: Good  Akathisia:  No  Handed:  Right  AIMS (if indicated): not done  Assets:  Communication Skills Desire for [place] Talents/Skills Transportation  ADL's:  Intact  Cognition: WNL  Sleep:  Good   Screenings: PHQ2-9     Office Visit from 08/30/2015 in [place] Office Visit from 07/26/2015 in [place]  PHQ-2 Total Score  0  0       Assessment and Plan: Major depressive disorder, recurrent.  Generalized anxiety disorder.  Attention deficit disorder, inattentive type.  Discontinue BuSpar as patient do not see any improvement.  He is not interested  to try any other medication.  He feels his current medicine working for his depression and ADD.  Continue Ritalin 20 mg 4 times a day, Abilify 10 mg daily, Wellbutrin XL 300 mg daily and Lamictal 200 mg daily.  He has no rash, itching, tremors or shakes.  Encourage to keep his appointment with his primary care physician Dr. Deforest Hoyles for annual physical and blood work.  Recommended to call us back if is any question or any concern.  Follow-up in 3 months.   Kathlee Nations, MD 03/30/2018, 2:43 PM

## 2018-06-22 ENCOUNTER — Other Ambulatory Visit (HOSPITAL_COMMUNITY): Payer: Self-pay | Admitting: Psychiatry

## 2018-06-22 DIAGNOSIS — F330 Major depressive disorder, recurrent, mild: Secondary | ICD-10-CM

## 2018-06-22 DIAGNOSIS — F411 Generalized anxiety disorder: Secondary | ICD-10-CM

## 2018-06-29 ENCOUNTER — Encounter (HOSPITAL_COMMUNITY): Payer: Self-pay | Admitting: Psychiatry

## 2018-06-29 ENCOUNTER — Ambulatory Visit (INDEPENDENT_AMBULATORY_CARE_PROVIDER_SITE_OTHER): Payer: 59 | Admitting: Psychiatry

## 2018-06-29 ENCOUNTER — Other Ambulatory Visit: Payer: Self-pay

## 2018-06-29 DIAGNOSIS — F330 Major depressive disorder, recurrent, mild: Secondary | ICD-10-CM | POA: Diagnosis not present

## 2018-06-29 DIAGNOSIS — F900 Attention-deficit hyperactivity disorder, predominantly inattentive type: Secondary | ICD-10-CM

## 2018-06-29 DIAGNOSIS — F411 Generalized anxiety disorder: Secondary | ICD-10-CM

## 2018-06-29 MED ORDER — METHYLPHENIDATE HCL 20 MG PO TABS: 20.0000 mg | ORAL_TABLET | Freq: Four times a day (QID) | ORAL | 0 refills | Status: DC

## 2018-06-29 MED ORDER — BUPROPION HCL ER (XL) 300 MG PO TB24: 300.0000 mg | ORAL_TABLET | Freq: Every day | ORAL | 0 refills | Status: DC

## 2018-06-29 MED ORDER — LAMOTRIGINE 150 MG PO TABS: 150.0000 mg | ORAL_TABLET | Freq: Two times a day (BID) | ORAL | 0 refills | Status: DC

## 2018-06-29 MED ORDER — ARIPIPRAZOLE 10 MG PO TABS: 10.0000 mg | ORAL_TABLET | Freq: Every day | ORAL | 0 refills | Status: DC

## 2018-06-29 NOTE — Progress Notes (Signed)
Virtual Visit via Telephone Note  I connected with Dakota Vang on 06/29/18 at  3:00 PM EDT by telephone and verified that I am speaking with the correct person using two identifiers.   I discussed the limitations, risks, security and privacy concerns of performing an evaluation and management service by telephone and the availability of in person appointments. I also discussed with the patient that there may be a patient responsible charge related to this service. The patient expressed understanding and agreed to proceed.   History of Present Illness: Patient was evaluated through telephone session.  He is taking his medication and reported no side effects.  He is relaxed and pleased that he is working full-time in 3 days he can work from home.  He admitted some anxiety due to pandemic coronavirus and he admitted weight gain since he is not going outside or doing any exercise.  However he feels the medicine is working very well.  He denies any irritability or any agitation.  He denies any anger, mania or any psychosis.  He admitted there are times when he does not take the Ritalin since his work is slow and he is able to cut down the Ritalin.  He reported no rash, itching, tremors or shakes.  He is sleeping good.  He saw Dr. Deforest Hoyles but scheduled to have blood work in coming weeks.  He reported family and children are doing okay and there has been no recent issues from them.  He denies any major panic attack.  His attention, focus and multitasking is good.  He denies drinking or using any illegal substances.  His appetite is okay.    Past Psychiatric History: Reviewed. No history of psychiatric inpatient treatment or any suicidal attempt.  Tried Prozac for many years until it stopped working.  We tried Cymbalta, Klonopin, BuSpar and Lexapro but that did not help.  History of ADD.  No history of paranoia, hallucination or psychosis.   Observations/Objective: Mental status examination done on the  phone.  Patient described his mood okay.  His speech is slow but clear and coherent.  His thought process logical and goal-directed.  There were no flight of ideas or loose association.  He denies any auditory or visual hallucination.  He denies any active or passive suicidal thoughts.  There were no delusions, paranoia or any grandiosity.  He does not appear to be distracted on the phone and he reported his attention concentration is okay.  He is alert and oriented x3.  He reported no tremors shakes or any rash.  His fund of knowledge is good.  His cognition is good.  His insight and judgment is okay.  Assessment and Plan: Major depressive disorder, recurrent.  Generalized anxiety disorder.  Attention deficit disorder, inattentive type.  Patient doing better and stable on his current medication.  He is no longer taking BuSpar which was discontinued on his last visit.  Like to continue his current medication which is working for him.  I will continue Ritalin 20 mg 4 times a day, Abilify 10 mg daily, Wellbutrin XL 300 mg daily and Lamictal 200 mg daily.  Encourage to watch his calorie intake and try to get exercise or walk 10 to 15 minutes.  I also encouraged to keep blood work appointment with Dr. Deforest Hoyles.  Recommended to call us back if he has any question or any concern.  Follow-up in 3 months.  Follow Up Instructions:    I discussed the assessment and treatment plan with  the patient. The patient was provided an opportunity to ask questions and all were answered. The patient agreed with the plan and demonstrated an understanding of the instructions.   The patient was advised to call back or seek an in-person evaluation if the symptoms worsen or if the condition fails to improve as anticipated.  I provided 20 minutes of non-face-to-face time during this encounter.   Kathlee Nations, MD

## 2018-07-27 DIAGNOSIS — F17210 Nicotine dependence, cigarettes, uncomplicated: Secondary | ICD-10-CM | POA: Diagnosis not present

## 2018-07-27 DIAGNOSIS — Z0000 Encounter for general adult medical examination without abnormal findings: Secondary | ICD-10-CM | POA: Diagnosis not present

## 2018-07-27 DIAGNOSIS — K219 Gastro-esophageal reflux disease without esophagitis: Secondary | ICD-10-CM | POA: Diagnosis not present

## 2018-07-31 DIAGNOSIS — Z131 Encounter for screening for diabetes mellitus: Secondary | ICD-10-CM | POA: Diagnosis not present

## 2018-07-31 DIAGNOSIS — I10 Essential (primary) hypertension: Secondary | ICD-10-CM | POA: Diagnosis not present

## 2018-07-31 DIAGNOSIS — E7800 Pure hypercholesterolemia, unspecified: Secondary | ICD-10-CM | POA: Diagnosis not present

## 2018-07-31 DIAGNOSIS — Z125 Encounter for screening for malignant neoplasm of prostate: Secondary | ICD-10-CM | POA: Diagnosis not present

## 2018-08-19 ENCOUNTER — Telehealth (HOSPITAL_COMMUNITY): Payer: Self-pay

## 2018-08-19 DIAGNOSIS — F900 Attention-deficit hyperactivity disorder, predominantly inattentive type: Secondary | ICD-10-CM

## 2018-08-19 MED ORDER — METHYLPHENIDATE HCL 20 MG PO TABS: 20.0000 mg | ORAL_TABLET | Freq: Four times a day (QID) | ORAL | 0 refills | Status: DC

## 2018-08-19 NOTE — Telephone Encounter (Signed)
Send to [facility].

## 2018-08-19 NOTE — Telephone Encounter (Signed)
Patient is calling for a refill on his Ritalin, [facility] [place].

## 2018-09-04 ENCOUNTER — Other Ambulatory Visit (HOSPITAL_COMMUNITY): Payer: Self-pay | Admitting: Psychiatry

## 2018-09-04 DIAGNOSIS — F330 Major depressive disorder, recurrent, mild: Secondary | ICD-10-CM

## 2018-09-15 ENCOUNTER — Other Ambulatory Visit (HOSPITAL_COMMUNITY): Payer: Self-pay | Admitting: Psychiatry

## 2018-09-15 DIAGNOSIS — F330 Major depressive disorder, recurrent, mild: Secondary | ICD-10-CM

## 2018-09-15 DIAGNOSIS — F411 Generalized anxiety disorder: Secondary | ICD-10-CM

## 2018-09-17 ENCOUNTER — Telehealth (HOSPITAL_COMMUNITY): Payer: Self-pay

## 2018-09-17 DIAGNOSIS — F900 Attention-deficit hyperactivity disorder, predominantly inattentive type: Secondary | ICD-10-CM

## 2018-09-17 MED ORDER — METHYLPHENIDATE HCL 20 MG PO TABS: 20.0000 mg | ORAL_TABLET | Freq: Four times a day (QID) | ORAL | 0 refills | Status: DC

## 2018-09-17 NOTE — Telephone Encounter (Signed)
done

## 2018-09-17 NOTE — Telephone Encounter (Signed)
Patient is calling for a refill on his Methylphenidate - [facility] [place]

## 2018-09-30 ENCOUNTER — Other Ambulatory Visit: Payer: Self-pay

## 2018-09-30 ENCOUNTER — Ambulatory Visit (INDEPENDENT_AMBULATORY_CARE_PROVIDER_SITE_OTHER): Payer: 59 | Admitting: Psychiatry

## 2018-09-30 ENCOUNTER — Encounter (HOSPITAL_COMMUNITY): Payer: Self-pay | Admitting: Psychiatry

## 2018-09-30 DIAGNOSIS — F900 Attention-deficit hyperactivity disorder, predominantly inattentive type: Secondary | ICD-10-CM

## 2018-09-30 DIAGNOSIS — F411 Generalized anxiety disorder: Secondary | ICD-10-CM

## 2018-09-30 DIAGNOSIS — F330 Major depressive disorder, recurrent, mild: Secondary | ICD-10-CM

## 2018-09-30 MED ORDER — BUPROPION HCL ER (XL) 300 MG PO TB24: 300.0000 mg | ORAL_TABLET | Freq: Every day | ORAL | 0 refills | Status: DC

## 2018-09-30 MED ORDER — METHYLPHENIDATE HCL 20 MG PO TABS: 20.0000 mg | ORAL_TABLET | Freq: Four times a day (QID) | ORAL | 0 refills | Status: DC

## 2018-09-30 MED ORDER — HYDROXYZINE PAMOATE 25 MG PO CAPS: 25.0000 mg | ORAL_CAPSULE | Freq: Every day | ORAL | 0 refills | Status: DC | PRN

## 2018-09-30 MED ORDER — ARIPIPRAZOLE 10 MG PO TABS: 10.0000 mg | ORAL_TABLET | Freq: Every day | ORAL | 0 refills | Status: DC

## 2018-09-30 MED ORDER — LAMOTRIGINE 150 MG PO TABS: 150.0000 mg | ORAL_TABLET | Freq: Two times a day (BID) | ORAL | 0 refills | Status: DC

## 2018-09-30 NOTE — Progress Notes (Signed)
Virtual Visit via Telephone Note  I connected with Dakota Vang on 09/30/18 at  1:20 PM EDT by telephone and verified that I am speaking with the correct person using two identifiers.   I discussed the limitations, risks, security and privacy concerns of performing an evaluation and management service by telephone and the availability of in person appointments. I also discussed with the patient that there may be a patient responsible charge related to this service. The patient expressed understanding and agreed to proceed.   History of Present Illness: Patient was evaluated by phone session.  He is taking his medication and reported no side effects.  Sometime he feels very anxious and nervous and like to take something which she can take it only as needed.  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  His attention and focus is good.  His sleep is good at night.  His energy level is good.  He is happy that his boys are doing well and his son got a job and other son graduated.  He denies any irritability, anger, mania or any psychosis.  He had a blood work recently at Dr. Sherilyn Cooter office and he was told everything is good.  He reported no tremors shakes or any EPS.  He like to continue his medication.  He denies any tremors or shakes.  His appetite is okay.  His weight is stable.  He has no palpitation.   Past Psychiatric History:Reviewed. No history of psychiatric inpatient treatment or any suicidal attempt. Tried Prozac for many years until it stopped working. We tried Cymbalta, Klonopin, BuSparand Lexapro but that did not help. History of ADD. No history of paranoia, hallucination or psychosis.  Psychiatric Specialty Exam: Physical Exam  ROS  There were no vitals taken for this visit.There is no height or weight on file to calculate BMI.  General Appearance: NA  Eye Contact:  NA  Speech:  Clear and Coherent and Normal Rate  Volume:  Normal   Mood:  Anxious  Affect:  NA  Thought Process:  Goal Directed  Orientation:  Full (Time, Place, and Person)  Thought Content:  WDL  Suicidal Thoughts:  No  Homicidal Thoughts:  No  Memory:  Immediate;   Good Recent;   Good Remote;   Good  Judgement:  Good  Insight:  Good  Psychomotor Activity:  Normal  Concentration:  Concentration: Good and Attention Span: Good  Recall:  Good  Fund of Knowledge:  Good  Language:  Good  Akathisia:  No  Handed:  Right  AIMS (if indicated):     Assets:  Communication Skills Desire for Improvement Housing Resilience Social Support Talents/Skills Transportation  ADL's:  Intact  Cognition:  WNL  Sleep:   good      Assessment and Plan: Major depressive disorder, recurrent.  Generalized anxiety disorder.  Attention deficit disorder, inattentive type.  Patient doing better on his medication.  He has no tremors shakes or any EPS.  Recommend to try low-dose hydroxyzine 25 mg to take as needed to help his anxiety.  He does not want to change his regular medications since it is helping him a lot.  Continue Ritalin 20 mg 4 times a day, Abilify 10 mg daily, Wellbutrin XL 300 mg daily and Lamictal 200 mg daily.  He has no rash or itching.  We will get blood work results from Dr. Sherilyn Cooter office.  Recommended to call us back if he  has any question or any concern.  Follow-up in 3 months.  Follow Up Instructions:    I discussed the assessment and treatment plan with the patient. The patient was provided an opportunity to ask questions and all were answered. The patient agreed with the plan and demonstrated an understanding of the instructions.   The patient was advised to call back or seek an in-person evaluation if the symptoms worsen or if the condition fails to improve as anticipated.  I provided 20 minutes of non-face-to-face time during this encounter.   Kathlee Nations, MD

## 2018-10-15 ENCOUNTER — Other Ambulatory Visit (HOSPITAL_COMMUNITY): Payer: Self-pay | Admitting: Psychiatry

## 2018-10-15 DIAGNOSIS — F411 Generalized anxiety disorder: Secondary | ICD-10-CM

## 2018-10-16 ENCOUNTER — Telehealth (HOSPITAL_COMMUNITY): Payer: Self-pay

## 2018-10-16 NOTE — Telephone Encounter (Signed)
done

## 2018-10-16 NOTE — Telephone Encounter (Signed)
Patient called requesting a refill on his Methylphenidate 20mg . [facility] [place]. Patient has a scheduled appointment on 12/30/18. Please review and advise. Thank you.

## 2018-11-20 ENCOUNTER — Telehealth (HOSPITAL_COMMUNITY): Payer: Self-pay

## 2018-11-20 DIAGNOSIS — F900 Attention-deficit hyperactivity disorder, predominantly inattentive type: Secondary | ICD-10-CM

## 2018-11-20 MED ORDER — METHYLPHENIDATE HCL 20 MG PO TABS: 20.0000 mg | ORAL_TABLET | Freq: Four times a day (QID) | ORAL | 0 refills | Status: DC

## 2018-11-20 NOTE — Telephone Encounter (Signed)
Patient called requesting a refill on his Methylphenidate 20mg  to get him to his appointment scheduled on 12/31/18. [facility] [place]. Thank you.

## 2018-11-20 NOTE — Telephone Encounter (Signed)
Done

## 2018-11-23 ENCOUNTER — Other Ambulatory Visit (HOSPITAL_COMMUNITY): Payer: Self-pay | Admitting: Psychiatry

## 2018-11-23 DIAGNOSIS — F330 Major depressive disorder, recurrent, mild: Secondary | ICD-10-CM

## 2018-11-24 ENCOUNTER — Other Ambulatory Visit (HOSPITAL_COMMUNITY): Payer: Self-pay | Admitting: Psychiatry

## 2018-11-24 DIAGNOSIS — F330 Major depressive disorder, recurrent, mild: Secondary | ICD-10-CM

## 2018-11-24 DIAGNOSIS — F411 Generalized anxiety disorder: Secondary | ICD-10-CM

## 2018-12-16 ENCOUNTER — Telehealth (HOSPITAL_COMMUNITY): Payer: Self-pay

## 2018-12-16 DIAGNOSIS — F900 Attention-deficit hyperactivity disorder, predominantly inattentive type: Secondary | ICD-10-CM

## 2018-12-16 MED ORDER — METHYLPHENIDATE HCL 20 MG PO TABS: 20.0000 mg | ORAL_TABLET | Freq: Four times a day (QID) | ORAL | 0 refills | Status: DC

## 2018-12-16 NOTE — Telephone Encounter (Signed)
Patient is calling for a refill on Adderall, [facility] [place]

## 2018-12-16 NOTE — Telephone Encounter (Signed)
done

## 2018-12-30 ENCOUNTER — Ambulatory Visit (HOSPITAL_COMMUNITY): Payer: 59 | Admitting: Psychiatry

## 2018-12-31 ENCOUNTER — Encounter (HOSPITAL_COMMUNITY): Payer: Self-pay | Admitting: Psychiatry

## 2018-12-31 ENCOUNTER — Other Ambulatory Visit: Payer: Self-pay

## 2018-12-31 ENCOUNTER — Ambulatory Visit (INDEPENDENT_AMBULATORY_CARE_PROVIDER_SITE_OTHER): Payer: 59 | Admitting: Psychiatry

## 2018-12-31 DIAGNOSIS — F900 Attention-deficit hyperactivity disorder, predominantly inattentive type: Secondary | ICD-10-CM | POA: Diagnosis not present

## 2018-12-31 DIAGNOSIS — F330 Major depressive disorder, recurrent, mild: Secondary | ICD-10-CM

## 2018-12-31 DIAGNOSIS — F411 Generalized anxiety disorder: Secondary | ICD-10-CM

## 2018-12-31 MED ORDER — BUPROPION HCL ER (XL) 300 MG PO TB24: 300.0000 mg | ORAL_TABLET | Freq: Every day | ORAL | 0 refills | Status: DC

## 2018-12-31 MED ORDER — ARIPIPRAZOLE 10 MG PO TABS: 10.0000 mg | ORAL_TABLET | Freq: Every day | ORAL | 0 refills | Status: DC

## 2018-12-31 MED ORDER — LAMOTRIGINE 150 MG PO TABS: 150.0000 mg | ORAL_TABLET | Freq: Two times a day (BID) | ORAL | 0 refills | Status: DC

## 2018-12-31 MED ORDER — METHYLPHENIDATE HCL 20 MG PO TABS: 20.0000 mg | ORAL_TABLET | Freq: Four times a day (QID) | ORAL | 0 refills | Status: DC
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
## 2019-02-17 ENCOUNTER — Telehealth (HOSPITAL_COMMUNITY): Payer: Self-pay | Admitting: *Deleted

## 2019-02-17 DIAGNOSIS — F900 Attention-deficit hyperactivity disorder, predominantly inattentive type: Secondary | ICD-10-CM

## 2019-02-17 MED ORDER — METHYLPHENIDATE HCL 20 MG PO TABS: 20.0000 mg | ORAL_TABLET | Freq: Four times a day (QID) | ORAL | 0 refills | Status: DC

## 2019-02-17 NOTE — Telephone Encounter (Signed)
Done

## 2019-02-17 NOTE — Telephone Encounter (Signed)
Received message from pt requesting a refill on his Ritalin. Next appointment is on 04/02/19.

## 2019-02-20 ENCOUNTER — Other Ambulatory Visit (HOSPITAL_COMMUNITY): Payer: Self-pay | Admitting: Psychiatry

## 2019-02-20 DIAGNOSIS — F330 Major depressive disorder, recurrent, mild: Secondary | ICD-10-CM

## 2019-02-20 DIAGNOSIS — F411 Generalized anxiety disorder: Secondary | ICD-10-CM

## 2019-02-23 ENCOUNTER — Other Ambulatory Visit (HOSPITAL_COMMUNITY): Payer: Self-pay | Admitting: Psychiatry

## 2019-02-23 DIAGNOSIS — F330 Major depressive disorder, recurrent, mild: Secondary | ICD-10-CM

## 2019-03-08 ENCOUNTER — Other Ambulatory Visit (HOSPITAL_COMMUNITY): Payer: Self-pay | Admitting: Psychiatry

## 2019-03-08 DIAGNOSIS — F411 Generalized anxiety disorder: Secondary | ICD-10-CM

## 2019-03-08 DIAGNOSIS — F330 Major depressive disorder, recurrent, mild: Secondary | ICD-10-CM

## 2019-03-22 ENCOUNTER — Telehealth (HOSPITAL_COMMUNITY): Payer: Self-pay | Admitting: *Deleted

## 2019-03-22 DIAGNOSIS — F900 Attention-deficit hyperactivity disorder, predominantly inattentive type: Secondary | ICD-10-CM

## 2019-03-22 MED ORDER — METHYLPHENIDATE HCL 20 MG PO TABS: 20.0000 mg | ORAL_TABLET | Freq: Four times a day (QID) | ORAL | 0 refills | Status: DC

## 2019-03-22 NOTE — Telephone Encounter (Signed)
Received request for refill of ADHD Medication Ritalin 20mg . [facility]. Please review.

## 2019-03-22 NOTE — Telephone Encounter (Signed)
done

## 2019-04-02 ENCOUNTER — Ambulatory Visit (INDEPENDENT_AMBULATORY_CARE_PROVIDER_SITE_OTHER): Payer: 59 | Admitting: Psychiatry

## 2019-04-02 ENCOUNTER — Encounter (HOSPITAL_COMMUNITY): Payer: Self-pay | Admitting: Psychiatry

## 2019-04-02 ENCOUNTER — Other Ambulatory Visit: Payer: Self-pay

## 2019-04-02 DIAGNOSIS — F411 Generalized anxiety disorder: Secondary | ICD-10-CM | POA: Diagnosis not present

## 2019-04-02 DIAGNOSIS — F330 Major depressive disorder, recurrent, mild: Secondary | ICD-10-CM | POA: Diagnosis not present

## 2019-04-02 DIAGNOSIS — F900 Attention-deficit hyperactivity disorder, predominantly inattentive type: Secondary | ICD-10-CM | POA: Diagnosis not present

## 2019-04-02 MED ORDER — BUPROPION HCL ER (XL) 300 MG PO TB24: 300.0000 mg | ORAL_TABLET | Freq: Every day | ORAL | 0 refills | Status: DC

## 2019-04-02 MED ORDER — LAMOTRIGINE 150 MG PO TABS: 150.0000 mg | ORAL_TABLET | Freq: Two times a day (BID) | ORAL | 0 refills | Status: DC

## 2019-04-02 MED ORDER — METHYLPHENIDATE HCL 20 MG PO TABS: 20.0000 mg | ORAL_TABLET | Freq: Four times a day (QID) | ORAL | 0 refills | Status: DC

## 2019-04-02 MED ORDER — ARIPIPRAZOLE 10 MG PO TABS: 10.0000 mg | ORAL_TABLET | Freq: Every day | ORAL | 0 refills | Status: DC
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
## 2019-04-21 ENCOUNTER — Telehealth (HOSPITAL_COMMUNITY): Payer: Self-pay | Admitting: *Deleted

## 2019-04-21 NOTE — Telephone Encounter (Signed)
Yes he has prescription at pharmacy which he can pick up tomorrow.

## 2019-04-21 NOTE — Telephone Encounter (Signed)
Pt called requesting refill on Ritalin 20mg  qid, written on 04/01/18 #120. Note to pharmacy says do not refill before 04/22/19. FYI.

## 2019-05-19 ENCOUNTER — Telehealth (HOSPITAL_COMMUNITY): Payer: Self-pay | Admitting: *Deleted

## 2019-05-19 DIAGNOSIS — F900 Attention-deficit hyperactivity disorder, predominantly inattentive type: Secondary | ICD-10-CM

## 2019-05-19 MED ORDER — METHYLPHENIDATE HCL 20 MG PO TABS: 20.0000 mg | ORAL_TABLET | Freq: Four times a day (QID) | ORAL | 0 refills | Status: DC

## 2019-05-19 NOTE — Telephone Encounter (Signed)
Done

## 2019-05-19 NOTE — Telephone Encounter (Signed)
Pt called requesting refill of Ritalin 20mg  qid. Last written on 04/02/19. Pt has an upcoming appointment on 06/30/19. Please review.

## 2019-05-23 ENCOUNTER — Other Ambulatory Visit (HOSPITAL_COMMUNITY): Payer: Self-pay | Admitting: Psychiatry

## 2019-05-23 DIAGNOSIS — F411 Generalized anxiety disorder: Secondary | ICD-10-CM

## 2019-05-23 DIAGNOSIS — F330 Major depressive disorder, recurrent, mild: Secondary | ICD-10-CM

## 2019-06-08 ENCOUNTER — Other Ambulatory Visit (HOSPITAL_COMMUNITY): Payer: Self-pay | Admitting: Psychiatry

## 2019-06-08 DIAGNOSIS — F330 Major depressive disorder, recurrent, mild: Secondary | ICD-10-CM

## 2019-06-08 DIAGNOSIS — F411 Generalized anxiety disorder: Secondary | ICD-10-CM

## 2019-06-18 ENCOUNTER — Ambulatory Visit: Payer: Self-pay | Attending: Internal Medicine

## 2019-06-18 ENCOUNTER — Telehealth (HOSPITAL_COMMUNITY): Payer: Self-pay | Admitting: *Deleted

## 2019-06-18 DIAGNOSIS — F900 Attention-deficit hyperactivity disorder, predominantly inattentive type: Secondary | ICD-10-CM

## 2019-06-18 MED ORDER — METHYLPHENIDATE HCL 20 MG PO TABS: 20.0000 mg | ORAL_TABLET | Freq: Four times a day (QID) | ORAL | 0 refills | Status: DC

## 2019-06-18 NOTE — Telephone Encounter (Signed)
Pt called requesting refill of Ritalin 20mg  qid starting on 05/19/19 with no refill. Pt has an upcoming appointment on 06/30/19. Please review.

## 2019-06-18 NOTE — Telephone Encounter (Signed)
Done

## 2019-06-24 ENCOUNTER — Other Ambulatory Visit (HOSPITAL_COMMUNITY): Payer: Self-pay | Admitting: Psychiatry

## 2019-06-24 DIAGNOSIS — F330 Major depressive disorder, recurrent, mild: Secondary | ICD-10-CM

## 2019-06-30 ENCOUNTER — Other Ambulatory Visit: Payer: Self-pay

## 2019-06-30 ENCOUNTER — Encounter (HOSPITAL_COMMUNITY): Payer: Self-pay | Admitting: Psychiatry

## 2019-06-30 ENCOUNTER — Telehealth (INDEPENDENT_AMBULATORY_CARE_PROVIDER_SITE_OTHER): Payer: 59 | Admitting: Psychiatry

## 2019-06-30 DIAGNOSIS — F900 Attention-deficit hyperactivity disorder, predominantly inattentive type: Secondary | ICD-10-CM

## 2019-06-30 DIAGNOSIS — F330 Major depressive disorder, recurrent, mild: Secondary | ICD-10-CM

## 2019-06-30 DIAGNOSIS — F411 Generalized anxiety disorder: Secondary | ICD-10-CM | POA: Diagnosis not present

## 2019-06-30 MED ORDER — METHYLPHENIDATE HCL 20 MG PO TABS: 20.0000 mg | ORAL_TABLET | Freq: Four times a day (QID) | ORAL | 0 refills | Status: DC

## 2019-06-30 MED ORDER — ARIPIPRAZOLE 10 MG PO TABS: 10.0000 mg | ORAL_TABLET | Freq: Every day | ORAL | 0 refills | Status: DC

## 2019-06-30 MED ORDER — BUPROPION HCL ER (XL) 300 MG PO TB24: 300.0000 mg | ORAL_TABLET | Freq: Every day | ORAL | 0 refills | Status: DC

## 2019-06-30 MED ORDER — LAMOTRIGINE 150 MG PO TABS: 150.0000 mg | ORAL_TABLET | Freq: Two times a day (BID) | ORAL | 0 refills | Status: DC

## 2019-06-30 NOTE — Progress Notes (Signed)
Virtual Visit via Telephone Note  I connected with Dakota Vang on 06/30/19 at  3:00 PM EDT by telephone and verified that I am speaking with the correct person using two identifiers.   I discussed the limitations, risks, security and privacy concerns of performing an evaluation and management service by telephone and the availability of in person appointments. I also discussed with the patient that there may be a patient responsible charge related to this service. The patient expressed understanding and agreed to proceed.   History of Present Illness: Patient is evaluated by phone session.  He is doing well on his medication.  Recently he seen his PCP Dr. Deforest Hoyles and no new medicine added.  His blood pressure is controlled on the meds.  He feels the current medicines is working.  He has no tremors shakes or any EPS.  His attention, concentration and multitasking going well.  He is able to finish his job and task on time.  He continues to work from home 2 days a week and 3 days he goes to work.  He has no anxiety attack, panic attack, crying spells or any feeling of hopelessness or worthlessness.  He is happy with his kids are doing well.  Even though the job is very busy but he is able to handle it well.  He is sleeping good.  He is hoping to have vacation in summer so he can go to [place].  He enjoys going to [place] with the family.  His energy level is good.  His appetite is okay.  His weight is unchanged from the past but usually he will lose weight since summer.  Patient like to keep his current medication.   Past Psychiatric History:Reviewed. No h/o inpatient treatment or suicidal attempt. Tried Prozac for many years until it stopped working. We tried Cymbalta, Klonopin, BuSparand Lexapro but that did not help. History of ADD. No history of paranoia, hallucination or psychosis.  Psychiatric Specialty Exam: Physical Exam  Review of Systems  There were no vitals taken for this visit.There is  no height or weight on file to calculate BMI.  General Appearance: NA  Eye Contact:  NA  Speech:  Clear and Coherent and Normal Rate  Volume:  Normal  Mood:  Euthymic  Affect:  NA  Thought Process:  Goal Directed  Orientation:  Full (Time, Place, and Person)  Thought Content:  WDL and Logical  Suicidal Thoughts:  No  Homicidal Thoughts:  No  Memory:  Immediate;   Good Recent;   Good Remote;   Good  Judgement:  Good  Insight:  Good  Psychomotor Activity:  NA  Concentration:  Concentration: Good and Attention Span: Good  Recall:  Good  Fund of Knowledge:  Good  Language:  Good  Akathisia:  No  Handed:  Right  AIMS (if indicated):     Assets:  Communication Skills Desire for Improvement Housing Resilience Social Support Talents/Skills Transportation  ADL's:  Intact  Cognition:  WNL  Sleep:   ok      Assessment and Plan: Major depressive disorder, recurrent.  Generalized anxiety disorder.  Attention deficit disorder, inattentive type.  Patient is a stable on his current medication.  Discussed medication side effects and benefits.  He has no tremors, shakes or any EPS.  He started walking 2-3 times a week with his wife.  Like to continue his current medication.  He has no rash or itching.  Continue Lamictal 300 mg daily, Ritalin 20 mg 4 times  a day, Abilify 10 mg daily and Wellbutrin XL 300 mg daily.  We will contact his PCP Dr. Milinda Hirschfeld to get recent blood work results.  Recommended to call us back if is any question or any concern.  Follow-up in 3 months.   Follow Up Instructions:    I discussed the assessment and treatment plan with the patient. The patient was provided an opportunity to ask questions and all were answered. The patient agreed with the plan and demonstrated an understanding of the instructions.   The patient was advised to call back or seek an in-person evaluation if the symptoms worsen or if the condition fails to improve as anticipated.  I provided 20  minutes of non-face-to-face time during this encounter.   Kathlee Nations, MD

## 2019-08-02 ENCOUNTER — Ambulatory Visit (HOSPITAL_COMMUNITY): Payer: Self-pay | Admitting: Surgery

## 2019-08-02 DIAGNOSIS — K4020 Bilateral inguinal hernia, without obstruction or gangrene, not specified as recurrent: Secondary | ICD-10-CM | POA: Insufficient documentation

## 2019-08-02 DIAGNOSIS — K432 Incisional hernia without obstruction or gangrene: Secondary | ICD-10-CM
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
## 2019-08-18 ENCOUNTER — Telehealth (HOSPITAL_COMMUNITY): Payer: Self-pay | Admitting: *Deleted

## 2019-08-18 DIAGNOSIS — F900 Attention-deficit hyperactivity disorder, predominantly inattentive type: Secondary | ICD-10-CM

## 2019-08-18 NOTE — Telephone Encounter (Signed)
Pt called requesting a refill of Methylphenidate 20mg  taken qid. Last ordered on 06/30/19. Pt has an upcoming appointment on 10/04/19. Please review.

## 2019-08-19 MED ORDER — METHYLPHENIDATE HCL 20 MG PO TABS: 20.0000 mg | ORAL_TABLET | Freq: Four times a day (QID) | ORAL | 0 refills | Status: DC

## 2019-08-19 NOTE — Telephone Encounter (Signed)
Done

## 2019-08-20 ENCOUNTER — Other Ambulatory Visit (HOSPITAL_COMMUNITY): Payer: Self-pay | Admitting: Psychiatry

## 2019-08-20 DIAGNOSIS — F330 Major depressive disorder, recurrent, mild: Secondary | ICD-10-CM

## 2019-08-20 DIAGNOSIS — F411 Generalized anxiety disorder: Secondary | ICD-10-CM

## 2019-09-05 ENCOUNTER — Other Ambulatory Visit (HOSPITAL_COMMUNITY): Payer: Self-pay | Admitting: Psychiatry

## 2019-09-05 DIAGNOSIS — F330 Major depressive disorder, recurrent, mild: Secondary | ICD-10-CM

## 2019-09-13 ENCOUNTER — Other Ambulatory Visit (HOSPITAL_COMMUNITY): Payer: Self-pay | Admitting: Psychiatry

## 2019-09-13 DIAGNOSIS — F330 Major depressive disorder, recurrent, mild: Secondary | ICD-10-CM

## 2019-09-16 ENCOUNTER — Telehealth (HOSPITAL_COMMUNITY): Payer: Self-pay | Admitting: *Deleted

## 2019-09-16 DIAGNOSIS — F900 Attention-deficit hyperactivity disorder, predominantly inattentive type: Secondary | ICD-10-CM

## 2019-09-16 NOTE — Telephone Encounter (Signed)
Pt called requesting a refill of Ritalin 20mg  last ordered 08/19/19. Pt next appointment 10/04/19. Please review.

## 2019-09-17 MED ORDER — METHYLPHENIDATE HCL 20 MG PO TABS: 20.0000 mg | ORAL_TABLET | Freq: Four times a day (QID) | ORAL | 0 refills | Status: DC

## 2019-09-17 NOTE — Telephone Encounter (Signed)
Done

## 2019-10-04 ENCOUNTER — Other Ambulatory Visit: Payer: Self-pay

## 2019-10-04 ENCOUNTER — Telehealth (INDEPENDENT_AMBULATORY_CARE_PROVIDER_SITE_OTHER): Payer: 59 | Admitting: Psychiatry

## 2019-10-04 ENCOUNTER — Encounter (HOSPITAL_COMMUNITY): Payer: Self-pay | Admitting: Psychiatry

## 2019-10-04 DIAGNOSIS — F330 Major depressive disorder, recurrent, mild: Secondary | ICD-10-CM

## 2019-10-04 DIAGNOSIS — F411 Generalized anxiety disorder: Secondary | ICD-10-CM

## 2019-10-04 DIAGNOSIS — F900 Attention-deficit hyperactivity disorder, predominantly inattentive type: Secondary | ICD-10-CM | POA: Diagnosis not present

## 2019-10-04 MED ORDER — METHYLPHENIDATE HCL 20 MG PO TABS: 20.0000 mg | ORAL_TABLET | Freq: Four times a day (QID) | ORAL | 0 refills | Status: DC

## 2019-10-04 MED ORDER — BUPROPION HCL ER (XL) 300 MG PO TB24: 300.0000 mg | ORAL_TABLET | Freq: Every day | ORAL | 0 refills | Status: DC

## 2019-10-04 MED ORDER — LAMOTRIGINE 150 MG PO TABS: 150.0000 mg | ORAL_TABLET | Freq: Two times a day (BID) | ORAL | 0 refills | Status: DC

## 2019-10-04 MED ORDER — ARIPIPRAZOLE 10 MG PO TABS: 10.0000 mg | ORAL_TABLET | Freq: Every day | ORAL | 0 refills | Status: DC

## 2019-10-04 NOTE — Progress Notes (Signed)
Virtual Visit via Telephone Note  I connected with Dakota Vang on 10/04/19 at  3:40 PM EDT by telephone and verified that I am speaking with the correct person using two identifiers.  Location: Patient: work Provider: home office   I discussed the limitations, risks, security and privacy concerns of performing an evaluation and management service by telephone and the availability of in person appointments. I also discussed with the patient that there may be a patient responsible charge related to this service. The patient expressed understanding and agreed to proceed.   History of Present Illness: Patient is evaluated by phone session.  He is taking his medication but lately he is more anxious because a lot of things going on with him.  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  He does not feel a major change in the future with the company but is still he feels anxious.  He also having upcoming surgery on 13th for his inguinal hernia.  He is worried about recovery.  He is busy at her job and not sure how long he will be out of work.  Denies any major panic attack or any anxiety attack.  He feels his depression is under control and he is able to finish his job on time.  He is able to do multitasking and his attention concentration is okay.  He has no tremors shakes or any EPS.  His appetite is okay.  His sleep is good.  His energy level is okay.  There has not much change in his weight.  He is trying to lose weight but he feels it is unchanged from the past.  Denies drinking or using any illegal substances.  He like to keep his current medication.   Past Psychiatric History:Reviewed. No h/oinpatient treatment or suicidal attempt. Tried Prozac for many years until it stopped working. We tried Cymbalta, Klonopin, BuSparand Lexapro but that did not help. History of ADD. No history of paranoia, hallucination or psychosis.     Psychiatric Specialty  Exam: Physical Exam  Review of Systems  Weight (!) 235 lb (106.6 kg).There is no height or weight on file to calculate BMI.  General Appearance: NA  Eye Contact:  NA  Speech:  Clear and Coherent  Volume:  Normal  Mood:  Anxious  Affect:  NA  Thought Process:  Coherent  Orientation:  Full (Time, Place, and Person)  Thought Content:  WDL  Suicidal Thoughts:  No  Homicidal Thoughts:  No  Memory:  Immediate;   Good Recent;   Good Remote;   Good  Judgement:  Good  Insight:  Good  Psychomotor Activity:  NA  Concentration:  Concentration: Good and Attention Span: Good  Recall:  Good  Fund of Knowledge:  Good  Language:  Good  Akathisia:  No  Handed:  Right  AIMS (if indicated):     Assets:  Communication Skills Desire for Improvement Housing Resilience Social Support Talents/Skills Transportation  ADL's:  Intact  Cognition:  WNL  Sleep:   good      Assessment and Plan: Major depressive disorder, recurrent.  Generalized anxiety disorder.  ADD, inattentive type.  Reassurance given.  Discussed usually hernia surgery is ambulatory and does not require hospitalization.  So far patient has no side effects and like to keep his current medication.  We will continue Abilify 10 mg daily, Wellbutrin 300 mg daily, Lamictal 300 mg daily and Ritalin 20 mg 4 times a day.  He has no rash, itching, tremors or shakes.  Follow-up in 3 months.  Follow Up Instructions:    I discussed the assessment and treatment plan with the patient. The patient was provided an opportunity to ask questions and all were answered. The patient agreed with the plan and demonstrated an understanding of the instructions.   The patient was advised to call back or seek an in-person evaluation if the symptoms worsen or if the condition fails to improve as anticipated.  I provided 15 minutes of non-face-to-face time during this encounter.   Kathlee Nations, MD

## 2019-10-12 NOTE — Patient Instructions (Addendum)
DUE TO COVID-19 ONLY ONE VISITOR IS ALLOWED TO COME WITH YOU AND STAY IN THE WAITING ROOM ONLY DURING PRE OP AND PROCEDURE DAY OF SURGERY. THE 2 VISITORS MAY VISIT WITH YOU AFTER SURGERY IN YOUR PRIVATE ROOM DURING VISITING HOURS ONLY!  YOU NEED TO HAVE A COVID 19 TEST ON_8/10______ @__3 :00 pm_____, THIS TEST MUST BE DONE BEFORE SURGERY  COVID TESTING [address] [place] [address], IT IS ON THE RIGHT GOING OUT [address] APPROXITAMELTELY 2 MINUTES PAST [employer] ON THE RIGHT. ONCE YOUR COVID TEST IS COMPLETED,  PLEASE BEGIN THE QUARANTINE INSTRUCTIONS AS OUTLINED IN YOUR HANDOUT.                Venia Carbon Cobos   Your procedure is scheduled on: 10/22/19   Report to [facility] Main  Entrance   Report to  Short Stay at 5:30 AM     Call this number if you have problems the morning of surgery [place], NO CHEWING GUM [place].   DRINK 2 PRESURGERY ENSURE DRINKS THE NIGHT BEFORE SURGERY AT 10:00 PM    NO SOLIDS AFTER MIDNIGHT THE DAY PRIOR TO THE SURGERY.   NOTHING BY MOUTH EXCEPT CLEAR LIQUIDS UNTIL THREE HOURS PRIOR TO SCHEDULED SURGERY.   PLEASE FINISH PRESURGERY ENSURE DRINK PER SURGEON BY: 4:30 AM    Take these medicines the morning of surgery with A SIP OF WATER: Lamictal, wellbutrin, Abilify, Prilosec                                 You may not have any metal on your body including              piercings  Do not wear jewelry, lotions, powders or deodorant                       Men may shave face and neck.   Do not bring valuables to the hospital. [place].  Contacts, dentures or bridgework may not be worn into surgery.       Patients discharged the day of surgery will not be allowed to drive home.   IF YOU ARE HAVING SURGERY AND GOING HOME THE SAME DAY, YOU MUST HAVE AN ADULT TO DRIVE YOU HOME AND BE WITH  YOU FOR 24 HOURS.  YOU MAY GO HOME BY TAXI OR UBER OR ORTHERWISE, BUT AN ADULT MUST ACCOMPANY YOU HOME AND STAY WITH YOU FOR 24 HOURS.  Name and phone number of your driver:  Special Instructions: N/A              Please read over the following fact sheets you were given: _____________________________________________________________________             [facility] - Preparing for Surgery Before surgery, you can play an important role.   Because skin is not sterile, your skin needs to be as free of germs as possible.   You can reduce the number of germs on your skin by washing with CHG (chlorahexidine gluconate) soap before surgery .  CHG is an antiseptic cleaner which kills germs and bonds with the skin to continue killing germs even after washing. Please DO NOT use if  you have an allergy to CHG or antibacterial soaps.   If your skin becomes reddened/irritated stop using the CHG and inform your nurse when you arrive at Short Stay.   You may shave your face/neck.  Please follow these instructions carefully:  1.  Shower with CHG Soap the night before surgery and the  morning of Surgery.  2.  If you choose to wash your hair, wash your hair first as usual with your  normal  shampoo.  3.  After you shampoo, rinse your hair and body thoroughly to remove the  shampoo.                                        4.  Use CHG as you would any other liquid soap.  You can apply chg directly  to the skin and wash                       Gently with a scrungie or clean washcloth.  5.  Apply the CHG Soap to your body ONLY FROM THE NECK DOWN.   Do not use on face/ open                           Wound or open sores. Avoid contact with eyes, ears mouth and genitals (private parts).                       Wash face,  Genitals (private parts) with your normal soap.             6.  Wash thoroughly, paying special attention to the area where your surgery  will be performed.  7.  Thoroughly rinse your body with warm  water from the neck down.  8.  DO NOT shower/wash with your normal soap after using and rinsing off  the CHG Soap.             9.  Pat yourself dry with a clean towel.            10.  Wear clean pajamas.            11.  Place clean sheets on your bed the night of your first shower and do not  sleep with pets. Day of Surgery : Do not apply any lotions/deodorants the morning of surgery.  Please wear clean clothes to the hospital/surgery center.  FAILURE TO FOLLOW THESE INSTRUCTIONS MAY RESULT IN THE CANCELLATION OF YOUR SURGERY PATIENT SIGNATURE_________________________________  NURSE SIGNATURE__________________________________  ________________________________________________________________________   Adam Phenix  An incentive spirometer is a tool that can help keep your lungs clear and active. This tool measures how well you are filling your lungs with each breath. Taking long deep breaths may help reverse or decrease the chance of developing breathing (pulmonary) problems (especially infection) following:  A long period of time when you are unable to move or be active. BEFORE THE PROCEDURE   If the spirometer includes an indicator to show your best effort, your nurse or respiratory therapist will set it to a desired goal.  If possible, sit up straight or lean slightly forward. Try not to slouch.  Hold the incentive spirometer in an upright position. INSTRUCTIONS FOR USE  1. Sit on the edge of your bed if possible, or sit up as far as you can  in bed or on a chair. 2. Hold the incentive spirometer in an upright position. 3. Breathe out normally. 4. Place the mouthpiece in your mouth and seal your lips tightly around it. 5. Breathe in slowly and as deeply as possible, raising the piston or the ball toward the top of the column. 6. Hold your breath for 3-5 seconds or for as long as possible. Allow the piston or ball to fall to the bottom of the column. 7. Remove the  mouthpiece from your mouth and breathe out normally. 8. Rest for a few seconds and repeat Steps 1 through 7 at least 10 times every 1-2 hours when you are awake. Take your time and take a few normal breaths between deep breaths. 9. The spirometer may include an indicator to show your best effort. Use the indicator as a goal to work toward during each repetition. 10. After each set of 10 deep breaths, practice coughing to be sure your lungs are clear. If you have an incision (the cut made at the time of surgery), support your incision when coughing by placing a pillow or rolled up towels firmly against it. Once you are able to get out of bed, walk around indoors and cough well. You may stop using the incentive spirometer when instructed by your caregiver.  RISKS AND COMPLICATIONS  Take your time so you do not get dizzy or light-headed.  If you are in pain, you may need to take or ask for pain medication before doing incentive spirometry. It is harder to take a deep breath if you are having pain. AFTER USE  Rest and breathe slowly and easily.  It can be helpful to keep track of a log of your progress. Your caregiver can provide you with a simple table to help with this. If you are using the spirometer at home, follow these instructions: [place] IF:   You are having difficultly using the spirometer.  You have trouble using the spirometer as often as instructed.  Your pain medication is not giving enough relief while using the spirometer.  You develop fever of 100.5 F (38.1 C) or higher. SEEK IMMEDIATE MEDICAL CARE IF:   You cough up bloody sputum that had not been present before.  You develop fever of 102 F (38.9 C) or greater.  You develop worsening pain at or near the incision site. MAKE SURE YOU:   Understand these instructions.  Will watch your condition.  Will get help right away if you are not doing well or get worse. Document Released: 07/08/2006 Document  Revised: 05/20/2011 Document Reviewed: 09/08/2006 [facility] Patient Information 2014 [place], [place].   ________________________________________________________________________

## 2019-10-13 ENCOUNTER — Encounter (HOSPITAL_COMMUNITY)
Admission: RE | Admit: 2019-10-13 | Discharge: 2019-10-13 | Disposition: A | Discharge status: Home / Self Care | Payer: 59 | Source: Ambulatory Visit | Attending: Surgery | Admitting: Surgery

## 2019-10-13 ENCOUNTER — Other Ambulatory Visit: Payer: Self-pay

## 2019-10-13 ENCOUNTER — Encounter (HOSPITAL_COMMUNITY): Payer: Self-pay

## 2019-10-13 DIAGNOSIS — Z01818 Encounter for other preprocedural examination: Secondary | ICD-10-CM | POA: Diagnosis not present

## 2019-10-13 HISTORY — DX: Depression, unspecified: F32.A

## 2019-10-13 LAB — CBC
HCT: 46.5 % (ref 39.0–52.0)
Hemoglobin: 16.1 g/dL (ref 13.0–17.0)
MCH: 29.6 pg (ref 26.0–34.0)
MCHC: 34.6 g/dL (ref 30.0–36.0)
MCV: 85.5 fL (ref 80.0–100.0)
Platelets: 214 10*3/uL (ref 150–400)
RBC: 5.44 MIL/uL (ref 4.22–5.81)
RDW: 12.5 % (ref 11.5–15.5)
WBC: 10 10*3/uL (ref 4.0–10.5)
nRBC: 0 % (ref 0.0–0.2)

## 2019-10-13 LAB — BASIC METABOLIC PANEL
Anion gap: 12 (ref 5–15)
BUN: 13 mg/dL (ref 6–20)
CO2: 25 mmol/L (ref 22–32)
Calcium: 9.6 mg/dL (ref 8.9–10.3)
Chloride: 103 mmol/L (ref 98–111)
Creatinine, Ser: 1.08 mg/dL (ref 0.61–1.24)
GFR calc Af Amer: >60 mL/min (ref >60)
GFR calc non Af Amer: >60 mL/min (ref >60)
Glucose, Bld: 137 mg/dL — ABNORMAL HIGH (ref 70–99)
Potassium: 4.4 mmol/L (ref 3.5–5.1)
Sodium: 140 mmol/L (ref 135–145)

## 2019-10-13 LAB — HEMOGLOBIN A1C
Hgb A1c MFr Bld: 5.9 % — ABNORMAL HIGH (ref 4.8–5.6)
Mean Plasma Glucose: 122.63 mg/dL

## 2019-10-13 NOTE — Progress Notes (Signed)
COVID Vaccine Completed:Yes Date COVID Vaccine completed:Pt thinks it is around 08/24/19 COVID vaccine manufacturer: Pfizer       PCP - Dr. Claria Dice Cardiologist - no  Chest x-ray - no EKG - 10/13/19 Stress Test - no ECHO - no Cardiac Cath - no  Sleep Study - no Stop Bang score was a 5 at PAT visit. PCP notified. CPAP -   Fasting Blood Sugar - NA Checks Blood Sugar _____ times a day  Blood Thinner Instructions:NA Aspirin Instructions: Last Dose:  Anesthesia review:   Patient denies shortness of breath, fever, cough and chest pain at PAT appointment  yes   Patient verbalized understanding of instructions that were given to them at the PAT appointment. Patient was also instructed that they will need to review over the PAT instructions again at home before surgery. Yes  Pt has no SOB while working or with ADLs he doesn't climb stairs.  He is very anxious about having surgery.

## 2019-10-19 ENCOUNTER — Other Ambulatory Visit (HOSPITAL_COMMUNITY)
Admission: RE | Admit: 2019-10-19 | Discharge: 2019-10-19 | Disposition: A | Discharge status: Home / Self Care | Payer: 59 | Source: Ambulatory Visit | Attending: Surgery | Admitting: Surgery

## 2019-10-19 DIAGNOSIS — Z20822 Contact with and (suspected) exposure to covid-19: Secondary | ICD-10-CM | POA: Insufficient documentation

## 2019-10-19 DIAGNOSIS — Z01812 Encounter for preprocedural laboratory examination: Secondary | ICD-10-CM | POA: Insufficient documentation

## 2019-10-19 LAB — SARS CORONAVIRUS 2 (TAT 6-24 HRS): SARS Coronavirus 2: NEGATIVE

## 2019-10-20 ENCOUNTER — Other Ambulatory Visit (HOSPITAL_COMMUNITY): Payer: Self-pay | Admitting: Psychiatry

## 2019-10-20 DIAGNOSIS — F330 Major depressive disorder, recurrent, mild: Secondary | ICD-10-CM

## 2019-10-21 ENCOUNTER — Encounter (HOSPITAL_COMMUNITY): Payer: Self-pay | Admitting: Surgery

## 2019-10-21 MED ORDER — CLINDAMYCIN PHOSPHATE 900 MG/50ML IV SOLN
900.0000 mg | INTRAVENOUS | Status: AC
  Filled 2019-10-21: qty 50

## 2019-10-21 MED ORDER — GENTAMICIN SULFATE 40 MG/ML IJ SOLN
440.0000 mg | INTRAVENOUS | Status: AC
  Administered 2019-10-22: 440 mg via INTRAVENOUS
  Filled 2019-10-21: qty 11

## 2019-10-21 MED ORDER — BUPIVACAINE LIPOSOME 1.3 % IJ SUSP
20.0000 mL | Freq: Once | INTRAMUSCULAR | Status: DC
  Filled 2019-10-21: qty 20

## 2019-10-21 NOTE — Anesthesia Preprocedure Evaluation (Addendum)
Anesthesia Evaluation  Patient identified by MRN, date of birth, ID band Patient awake    Reviewed: Allergy & Precautions, NPO status , Patient's Chart, lab work & pertinent test results  Airway Mallampati: III  TM Distance: >3 FB Neck ROM: Full    Dental  (+) Teeth Intact, Chipped,    Pulmonary Current Smoker and Patient abstained from smoking.,    Pulmonary exam normal breath sounds clear to auscultation       Cardiovascular hypertension, Pt. on medications Normal cardiovascular exam Rhythm:Regular Rate:Normal     Neuro/Psych PSYCHIATRIC DISORDERS Anxiety Depression negative neurological ROS     GI/Hepatic Neg liver ROS, GERD  Medicated and Controlled,INCISIONAL ABDOMINAL WALL HERNIA. BILATERAL INGUINAL HERNIA   Endo/Other  Obesity   Renal/GU negative Renal ROS     Musculoskeletal negative musculoskeletal ROS (+)   Abdominal   Peds  Hematology negative hematology ROS (+)   Anesthesia Other Findings   Reproductive/Obstetrics                            Anesthesia Physical Anesthesia Plan  ASA: II  Anesthesia Plan: General   Post-op Pain Management:    Induction: Intravenous  PONV Risk Score and Plan: 3 and Midazolam, Dexamethasone and Ondansetron  Airway Management Planned: Oral ETT  Additional Equipment:   Intra-op Plan:   Post-operative Plan: Extubation in OR  Informed Consent: I have reviewed the patients History and Physical, chart, labs and discussed the procedure including the risks, benefits and alternatives for the proposed anesthesia with the patient or authorized representative who has indicated his/her understanding and acceptance.     Dental advisory given  Plan Discussed with: CRNA  Anesthesia Plan Comments:        Anesthesia Quick Evaluation

## 2019-10-22 ENCOUNTER — Encounter (HOSPITAL_COMMUNITY): Payer: Self-pay | Admitting: Surgery

## 2019-10-22 ENCOUNTER — Ambulatory Visit (HOSPITAL_COMMUNITY): Payer: 59 | Admitting: Anesthesiology

## 2019-10-22 ENCOUNTER — Encounter (HOSPITAL_COMMUNITY)
Admission: RE | Disposition: A | Discharge status: Home / Self Care | Payer: Self-pay | Source: Ambulatory Visit | Attending: Surgery

## 2019-10-22 ENCOUNTER — Other Ambulatory Visit: Payer: Self-pay

## 2019-10-22 ENCOUNTER — Ambulatory Visit (HOSPITAL_COMMUNITY)
Admission: RE | Admit: 2019-10-22 | Discharge: 2019-10-22 | Disposition: A | Discharge status: Home / Self Care | Payer: 59 | Source: Ambulatory Visit | Attending: Surgery | Admitting: Surgery

## 2019-10-22 DIAGNOSIS — D176 Benign lipomatous neoplasm of spermatic cord: Secondary | ICD-10-CM | POA: Insufficient documentation

## 2019-10-22 DIAGNOSIS — K219 Gastro-esophageal reflux disease without esophagitis: Secondary | ICD-10-CM | POA: Diagnosis not present

## 2019-10-22 DIAGNOSIS — K4020 Bilateral inguinal hernia, without obstruction or gangrene, not specified as recurrent: Secondary | ICD-10-CM | POA: Insufficient documentation

## 2019-10-22 DIAGNOSIS — F329 Major depressive disorder, single episode, unspecified: Secondary | ICD-10-CM | POA: Insufficient documentation

## 2019-10-22 DIAGNOSIS — I10 Essential (primary) hypertension: Secondary | ICD-10-CM | POA: Diagnosis not present

## 2019-10-22 DIAGNOSIS — Z6830 Body mass index (BMI) 30.0-30.9, adult: Secondary | ICD-10-CM | POA: Diagnosis not present

## 2019-10-22 DIAGNOSIS — Z79899 Other long term (current) drug therapy: Secondary | ICD-10-CM | POA: Insufficient documentation

## 2019-10-22 DIAGNOSIS — K660 Peritoneal adhesions (postprocedural) (postinfection): Secondary | ICD-10-CM | POA: Diagnosis not present

## 2019-10-22 DIAGNOSIS — Z7982 Long term (current) use of aspirin: Secondary | ICD-10-CM | POA: Diagnosis not present

## 2019-10-22 DIAGNOSIS — F17210 Nicotine dependence, cigarettes, uncomplicated: Secondary | ICD-10-CM | POA: Insufficient documentation

## 2019-10-22 DIAGNOSIS — E7800 Pure hypercholesterolemia, unspecified: Secondary | ICD-10-CM | POA: Insufficient documentation

## 2019-10-22 DIAGNOSIS — E669 Obesity, unspecified: Secondary | ICD-10-CM | POA: Diagnosis not present

## 2019-10-22 DIAGNOSIS — K430 Incisional hernia with obstruction, without gangrene: Secondary | ICD-10-CM | POA: Diagnosis not present

## 2019-10-22 DIAGNOSIS — Z9049 Acquired absence of other specified parts of digestive tract: Secondary | ICD-10-CM | POA: Diagnosis not present

## 2019-10-22 DIAGNOSIS — F419 Anxiety disorder, unspecified: Secondary | ICD-10-CM | POA: Diagnosis not present

## 2019-10-22 HISTORY — PX: INGUINAL HERNIA REPAIR: SHX194

## 2019-10-22 HISTORY — PX: VENTRAL HERNIA REPAIR: SHX424

## 2019-10-22 LAB — TYPE AND SCREEN
ABO/RH(D): B NEG
Antibody Screen: NEGATIVE

## 2019-10-22 SURGERY — REPAIR, HERNIA, VENTRAL
Anesthesia: General | Site: Inguinal

## 2019-10-22 MED ORDER — KETAMINE HCL 10 MG/ML IJ SOLN
INTRAMUSCULAR | Status: DC | PRN
Start: 2019-10-22 — End: 2019-10-22
  Administered 2019-10-22: 30 mg via INTRAVENOUS
  Administered 2019-10-22: 10 mg via INTRAVENOUS

## 2019-10-22 MED ORDER — ONDANSETRON HCL 4 MG/2ML IJ SOLN
INTRAMUSCULAR | Status: DC | PRN
  Administered 2019-10-22: 4 mg via INTRAVENOUS

## 2019-10-22 MED ORDER — LABETALOL HCL 5 MG/ML IV SOLN
INTRAVENOUS | Status: AC
  Administered 2019-10-22: 10 mg via INTRAVENOUS
  Filled 2019-10-22: qty 4

## 2019-10-22 MED ORDER — BUPIVACAINE-EPINEPHRINE 0.25% -1:200000 IJ SOLN
INTRAMUSCULAR | Status: AC
  Filled 2019-10-22: qty 1

## 2019-10-22 MED ORDER — ORAL CARE MOUTH RINSE: 15.0000 mL | Freq: Once | OROMUCOSAL | Status: AC

## 2019-10-22 MED ORDER — MIDAZOLAM HCL 2 MG/2ML IJ SOLN
INTRAMUSCULAR | Status: AC
  Filled 2019-10-22: qty 2

## 2019-10-22 MED ORDER — ROCURONIUM BROMIDE 10 MG/ML (PF) SYRINGE
PREFILLED_SYRINGE | INTRAVENOUS | Status: DC | PRN
  Administered 2019-10-22: 20 mg via INTRAVENOUS
  Administered 2019-10-22: 60 mg via INTRAVENOUS
  Administered 2019-10-22: 10 mg via INTRAVENOUS
  Administered 2019-10-22 (×2): 20 mg via INTRAVENOUS
  Administered 2019-10-22: 10 mg via INTRAVENOUS

## 2019-10-22 MED ORDER — CLINDAMYCIN PHOSPHATE 900 MG/50ML IV SOLN
INTRAVENOUS | Status: DC | PRN
  Administered 2019-10-22: 900 mg via INTRAVENOUS

## 2019-10-22 MED ORDER — FENTANYL CITRATE (PF) 100 MCG/2ML IJ SOLN
INTRAMUSCULAR | Status: AC
  Administered 2019-10-22: 50 ug via INTRAVENOUS
  Filled 2019-10-22: qty 2

## 2019-10-22 MED ORDER — LABETALOL HCL 5 MG/ML IV SOLN
10.0000 mg | Freq: Once | INTRAVENOUS | Status: AC
  Administered 2019-10-22: 10 mg via INTRAVENOUS

## 2019-10-22 MED ORDER — DEXAMETHASONE SODIUM PHOSPHATE 10 MG/ML IJ SOLN
INTRAMUSCULAR | Status: AC
  Filled 2019-10-22: qty 2

## 2019-10-22 MED ORDER — GABAPENTIN 300 MG PO CAPS
300.0000 mg | ORAL_CAPSULE | ORAL | Status: AC
  Administered 2019-10-22: 300 mg via ORAL
  Filled 2019-10-22: qty 1

## 2019-10-22 MED ORDER — FENTANYL CITRATE (PF) 250 MCG/5ML IJ SOLN
INTRAMUSCULAR | Status: AC
  Filled 2019-10-22: qty 5

## 2019-10-22 MED ORDER — ENSURE PRE-SURGERY PO LIQD
296.0000 mL | Freq: Once | ORAL | Status: DC
  Filled 2019-10-22: qty 296

## 2019-10-22 MED ORDER — DEXMEDETOMIDINE (PRECEDEX) IN NS 20 MCG/5ML (4 MCG/ML) IV SYRINGE
PREFILLED_SYRINGE | INTRAVENOUS | Status: AC
  Filled 2019-10-22: qty 5

## 2019-10-22 MED ORDER — ONDANSETRON HCL 4 MG/2ML IJ SOLN
INTRAMUSCULAR | Status: AC
  Filled 2019-10-22: qty 4

## 2019-10-22 MED ORDER — PROPOFOL 10 MG/ML IV BOLUS
INTRAVENOUS | Status: DC | PRN
  Administered 2019-10-22: 200 mg via INTRAVENOUS

## 2019-10-22 MED ORDER — ACETAMINOPHEN 10 MG/ML IV SOLN
INTRAVENOUS | Status: AC
  Administered 2019-10-22: 1000 mg via INTRAVENOUS
  Filled 2019-10-22: qty 100

## 2019-10-22 MED ORDER — FENTANYL CITRATE (PF) 100 MCG/2ML IJ SOLN
INTRAMUSCULAR | Status: AC
  Filled 2019-10-22: qty 2

## 2019-10-22 MED ORDER — BUPIVACAINE LIPOSOME 1.3 % IJ SUSP
INTRAMUSCULAR | Status: DC | PRN
  Administered 2019-10-22: 20 mL

## 2019-10-22 MED ORDER — PHENYLEPHRINE 40 MCG/ML (10ML) SYRINGE FOR IV PUSH (FOR BLOOD PRESSURE SUPPORT)
PREFILLED_SYRINGE | INTRAVENOUS | Status: DC | PRN
  Administered 2019-10-22: 120 ug via INTRAVENOUS

## 2019-10-22 MED ORDER — OXYCODONE HCL 5 MG PO TABS
ORAL_TABLET | ORAL | Status: AC
  Administered 2019-10-22: 5 mg via ORAL
  Filled 2019-10-22: qty 1

## 2019-10-22 MED ORDER — CHLORHEXIDINE GLUCONATE CLOTH 2 % EX PADS: 6.0000 | MEDICATED_PAD | Freq: Once | CUTANEOUS | Status: DC

## 2019-10-22 MED ORDER — CELECOXIB 200 MG PO CAPS
400.0000 mg | ORAL_CAPSULE | ORAL | Status: AC
  Administered 2019-10-22: 400 mg via ORAL
  Filled 2019-10-22: qty 2

## 2019-10-22 MED ORDER — LACTATED RINGERS IV SOLN
INTRAVENOUS | Status: DC | PRN
  Administered 2019-10-22: 1000 mL

## 2019-10-22 MED ORDER — OXYCODONE HCL 5 MG/5ML PO SOLN: 5.0000 mg | Freq: Once | ORAL | Status: AC | PRN

## 2019-10-22 MED ORDER — LACTATED RINGERS IV SOLN: INTRAVENOUS | Status: DC

## 2019-10-22 MED ORDER — FENTANYL CITRATE (PF) 100 MCG/2ML IJ SOLN
25.0000 ug | INTRAMUSCULAR | Status: DC | PRN
  Administered 2019-10-22: 50 ug via INTRAVENOUS

## 2019-10-22 MED ORDER — ACETAMINOPHEN 500 MG PO TABS
1000.0000 mg | ORAL_TABLET | ORAL | Status: AC
  Administered 2019-10-22: 1000 mg via ORAL
  Filled 2019-10-22: qty 2

## 2019-10-22 MED ORDER — METHOCARBAMOL 750 MG PO TABS: 750.0000 mg | ORAL_TABLET | Freq: Four times a day (QID) | ORAL | 2 refills | Status: DC | PRN

## 2019-10-22 MED ORDER — BUPIVACAINE-EPINEPHRINE 0.25% -1:200000 IJ SOLN
INTRAMUSCULAR | Status: DC | PRN
  Administered 2019-10-22: 100 mL

## 2019-10-22 MED ORDER — MIDAZOLAM HCL 5 MG/5ML IJ SOLN
INTRAMUSCULAR | Status: DC | PRN
  Administered 2019-10-22: 2 mg via INTRAVENOUS

## 2019-10-22 MED ORDER — TRAMADOL HCL 50 MG PO TABS: 50.0000 mg | ORAL_TABLET | Freq: Four times a day (QID) | ORAL | 0 refills | Status: DC | PRN

## 2019-10-22 MED ORDER — FENTANYL CITRATE (PF) 100 MCG/2ML IJ SOLN
INTRAMUSCULAR | Status: DC | PRN
  Administered 2019-10-22 (×5): 50 ug via INTRAVENOUS
  Administered 2019-10-22: 100 ug via INTRAVENOUS

## 2019-10-22 MED ORDER — STERILE WATER FOR IRRIGATION IR SOLN
Status: DC | PRN
  Administered 2019-10-22: 1000 mL

## 2019-10-22 MED ORDER — ROCURONIUM BROMIDE 10 MG/ML (PF) SYRINGE
PREFILLED_SYRINGE | INTRAVENOUS | Status: AC
  Filled 2019-10-22: qty 20

## 2019-10-22 MED ORDER — KETAMINE HCL 10 MG/ML IJ SOLN
INTRAMUSCULAR | Status: AC
  Filled 2019-10-22: qty 1

## 2019-10-22 MED ORDER — LIDOCAINE 2% (20 MG/ML) 5 ML SYRINGE
INTRAMUSCULAR | Status: DC | PRN
  Administered 2019-10-22: 60 mg via INTRAVENOUS

## 2019-10-22 MED ORDER — OXYCODONE HCL 5 MG PO TABS: 5.0000 mg | ORAL_TABLET | Freq: Once | ORAL | Status: AC | PRN

## 2019-10-22 MED ORDER — FENTANYL CITRATE (PF) 100 MCG/2ML IJ SOLN
INTRAMUSCULAR | Status: AC
  Administered 2019-10-22: 25 ug via INTRAVENOUS
  Filled 2019-10-22: qty 2

## 2019-10-22 MED ORDER — LIDOCAINE 2% (20 MG/ML) 5 ML SYRINGE
INTRAMUSCULAR | Status: AC
  Filled 2019-10-22: qty 10

## 2019-10-22 MED ORDER — PROMETHAZINE HCL 25 MG/ML IJ SOLN: 6.2500 mg | INTRAMUSCULAR | Status: DC | PRN

## 2019-10-22 MED ORDER — ENSURE PRE-SURGERY PO LIQD
592.0000 mL | Freq: Once | ORAL | Status: DC
  Filled 2019-10-22: qty 592

## 2019-10-22 MED ORDER — SUGAMMADEX SODIUM 200 MG/2ML IV SOLN
INTRAVENOUS | Status: DC | PRN
  Administered 2019-10-22: 400 mg via INTRAVENOUS

## 2019-10-22 MED ORDER — GLYCOPYRROLATE PF 0.2 MG/ML IJ SOSY
PREFILLED_SYRINGE | INTRAMUSCULAR | Status: DC | PRN
  Administered 2019-10-22: .2 mg via INTRAVENOUS

## 2019-10-22 MED ORDER — PROPOFOL 10 MG/ML IV BOLUS
INTRAVENOUS | Status: AC
  Filled 2019-10-22: qty 40

## 2019-10-22 MED ORDER — 0.9 % SODIUM CHLORIDE (POUR BTL) OPTIME
TOPICAL | Status: DC | PRN
  Administered 2019-10-22: 1000 mL

## 2019-10-22 MED ORDER — CHLORHEXIDINE GLUCONATE 0.12 % MT SOLN
15.0000 mL | Freq: Once | OROMUCOSAL | Status: AC
  Administered 2019-10-22: 15 mL via OROMUCOSAL

## 2019-10-22 MED ORDER — LABETALOL HCL 5 MG/ML IV SOLN: 10.0000 mg | Freq: Once | INTRAVENOUS | Status: AC

## 2019-10-22 MED ORDER — ACETAMINOPHEN 10 MG/ML IV SOLN: 1000.0000 mg | Freq: Once | INTRAVENOUS | Status: AC

## 2019-10-22 MED ORDER — DEXAMETHASONE SODIUM PHOSPHATE 10 MG/ML IJ SOLN
INTRAMUSCULAR | Status: DC | PRN
  Administered 2019-10-22: 10 mg via INTRAVENOUS

## 2019-10-22 MED ORDER — BUPIVACAINE-EPINEPHRINE (PF) 0.25% -1:200000 IJ SOLN
INTRAMUSCULAR | Status: AC
  Filled 2019-10-22: qty 60

## 2019-10-22 SURGICAL SUPPLY — 46 items
BINDER ABDOMINAL 12 ML 46-62 (SOFTGOODS) ×4 IMPLANT
CABLE HIGH FREQUENCY MONO STRZ (ELECTRODE) ×4 IMPLANT
CHLORAPREP W/TINT 26 (MISCELLANEOUS) ×4 IMPLANT
CLOSURE STERI-STRIP 1/2X4 (GAUZE/BANDAGES/DRESSINGS) ×1
CLOSURE WOUND 1/2 X4 (GAUZE/BANDAGES/DRESSINGS)
CLSR STERI-STRIP ANTIMIC 1/2X4 (GAUZE/BANDAGES/DRESSINGS) ×3 IMPLANT
COVER SURGICAL LIGHT HANDLE (MISCELLANEOUS) ×4 IMPLANT
COVER WAND RF STERILE (DRAPES) IMPLANT
DECANTER SPIKE VIAL GLASS SM (MISCELLANEOUS) ×4 IMPLANT
DEVICE SECURE STRAP 25 ABSORB (INSTRUMENTS) ×4 IMPLANT
DRAPE LAPAROSCOPIC ABDOMINAL (DRAPES) ×4 IMPLANT
DRAPE WARM FLUID 44X44 (DRAPES) ×4 IMPLANT
DRSG TEGADERM 2-3/8X2-3/4 SM (GAUZE/BANDAGES/DRESSINGS) IMPLANT
ELECT REM PT RETURN 15FT ADLT (MISCELLANEOUS) ×8 IMPLANT
GAUZE SPONGE 2X2 8PLY STRL LF (GAUZE/BANDAGES/DRESSINGS) IMPLANT
GLOVE ECLIPSE 8.0 STRL XLNG CF (GLOVE) ×4 IMPLANT
GLOVE INDICATOR 8.0 STRL GRN (GLOVE) ×4 IMPLANT
GOWN STRL REUS W/TWL XL LVL3 (GOWN DISPOSABLE) ×8 IMPLANT
IRRIG SUCT STRYKERFLOW 2 WTIP (MISCELLANEOUS) ×4
IRRIGATION SUCT STRKRFLW 2 WTP (MISCELLANEOUS) ×2 IMPLANT
KIT BASIN OR (CUSTOM PROCEDURE TRAY) ×4 IMPLANT
KIT TURNOVER KIT A (KITS) IMPLANT
MARKER SKIN DUAL TIP RULER LAB (MISCELLANEOUS) ×4 IMPLANT
MESH ULTRAPRO 6X6 15CM15CM (Mesh General) ×12 IMPLANT
MESH VENTRALIGHT ST 8X10 (Mesh General) ×4 IMPLANT
PACK GENERAL/GYN (CUSTOM PROCEDURE TRAY) ×4 IMPLANT
PAD POSITIONING PINK XL (MISCELLANEOUS) ×4 IMPLANT
PENCIL SMOKE EVACUATOR (MISCELLANEOUS) IMPLANT
SCISSORS LAP 5X35 DISP (ENDOMECHANICALS) ×4 IMPLANT
SET TUBE SMOKE EVAC HIGH FLOW (TUBING) ×4 IMPLANT
SLEEVE ADV FIXATION 5X100MM (TROCAR) ×8 IMPLANT
SPONGE GAUZE 2X2 STER 10/PKG (GAUZE/BANDAGES/DRESSINGS)
STRIP CLOSURE SKIN 1/2X4 (GAUZE/BANDAGES/DRESSINGS) IMPLANT
SUT ETHIBOND NAB CT1 #1 30IN (SUTURE) IMPLANT
SUT MNCRL AB 4-0 PS2 18 (SUTURE) ×4 IMPLANT
SUT NOVA NAB DX-16 0-1 5-0 T12 (SUTURE) IMPLANT
SUT PDS AB 1 CT1 27 (SUTURE) ×20 IMPLANT
SUT PROLENE 1 CT 1 30 (SUTURE) ×24 IMPLANT
SUT VIC AB 2-0 SH 27 (SUTURE) ×4
SUT VIC AB 2-0 SH 27X BRD (SUTURE) ×2 IMPLANT
SUT VICRYL 0 UR6 27IN ABS (SUTURE) ×4 IMPLANT
SYR 20ML LL LF (SYRINGE) ×4 IMPLANT
TOWEL OR 17X26 10 PK STRL BLUE (TOWEL DISPOSABLE) ×4 IMPLANT
TOWEL OR NON WOVEN STRL DISP B (DISPOSABLE) ×4 IMPLANT
TROCAR ADV FIXATION 5X100MM (TROCAR) ×8 IMPLANT
TROCAR XCEL BLUNT TIP 100MML (ENDOMECHANICALS) ×4 IMPLANT

## 2019-10-22 NOTE — Op Note (Addendum)
10/22/2019  PATIENT:  Dakota Vang  54 y.o. male  Patient Care Team: Wenda Low, MD as PCP - General (Internal Medicine) Ladene Artist, MD as Consulting Physician (Gastroenterology) Kathlee Nations, MD as Consulting Physician (Psychiatry) Tayvin Boston, MD as Consulting Physician (General Surgery)  PRE-OPERATIVE DIAGNOSIS:  [place]. BILATERAL INGUINAL HERNIA  POST-OPERATIVE DIAGNOSIS:    INCISIONAL INCARCERATED ABDOMINAL WALL HERNIAS BILATERAL INGUINAL HERNIAS  PROCEDURE:   LAPAROSCOPIC REPAIR OF  INCISIONAL INCARCERATED ABDOMINAL WALL HERNIAS WITH MESH LAPAROSCOPIC BILATERAL INGUINAL HERNIA REPAIR WITH MESH TAP BLOCK - BILATERAL  SURGEON:  [name], MD  ASSISTANT: Carlena Hurl, PA-C  ANESTHESIA:     General  Nerve block provided with liposomal bupivacaine (Experel) mixed with 0.25% bupivacaine as a Bilateral TAP block x 12mL each side at the level of the transverse abdominis & preperitoneal spaces along the flank at the anterior axillary line, from subcostal ridge to iliac crest under laparoscopic guidance   EBL:  Total I/O In: 900 [I.V.:900] Out: 29 [Blood:50]  Per anesthesia record  Delay start of Pharmacological VTE agent (>24hrs) due to surgical blood loss or risk of bleeding:  no  DRAINS: none   SPECIMEN:  No Specimen  DISPOSITION OF SPECIMEN:  N/A  COUNTS:  YES  PLAN OF CARE: Discharge to home after PACU  PATIENT DISPOSITION:  PACU - hemodynamically stable.  INDICATION: Pleasant patient has developed a ventral wall abdominal hernia. Recommendation was made for surgical repair  The anatomy & physiology of the abdominal wall was discussed. The pathophysiology of hernias was discussed. Natural history risks without surgery including progeressive enlargement, pain, incarceration & strangulation was discussed. Contributors to complications such as smoking, obesity, diabetes, prior surgery, etc were discussed.  I feel the  risks of no intervention will lead to serious problems that outweigh the operative risks; therefore, I recommended surgery to reduce and repair the hernia. I explained laparoscopic techniques with possible need for an open approach. I noted the probable use of mesh to patch and/or buttress the hernia repair.  Risks such as bleeding, infection, abscess, need for further treatment, heart attack, death, and other risks were discussed. I noted a good likelihood this will help address the problem. Goals of post-operative recovery were discussed as well. Possibility that this will not correct all symptoms was explained. I stressed the importance of low-impact activity, aggressive pain control, avoiding constipation, & not pushing through pain to minimize risk of post-operative chronic pain or injury. Possibility of reherniation especially with smoking, obesity, diabetes, immunosuppression, and other health conditions was discussed. We will work to minimize complications.   An educational handout further explaining the pathology & treatment options was given as well. Questions were answered. The patient expresses understanding & wishes to proceed with surgery.   OR FINDINGS:   Moderate indirect inguinal hernia on the right side.  Mild laxity but no true femoral hernia.  No direct space or obturator hernia.    On the left side smaller but definite indirect inguinal hernia.  No direct space femoral or obturator hernias.  Supraumbilical midline Swiss cheese hernias along with periumbilical ventral hernia along prior upper midline incision.  Swath of omentum incarcerated within it.  1 pocket of some fat necrosis.  That was removed.  .  10 x 5 cm region.  Significant diastases recti in the setting of thin abdominal wall and obesity.  Type of repair: Laparoscopic underlay repair.  Primary repair of umbilical hernia   Placement of mesh: Centrally  intraperitoneal with edges tucked into RECTRORECTUS & preperitoneal  space  Name of mesh: Bard Ventralight dual sided (polypropylene / Seprafilm)  Size of mesh: 25x20cm  Orientation: Vertical  Mesh overlap:  5-7cm   DESCRIPTION:  I made a transverse incision through the inferior umbilical fold.  I made a small transverse nick through the anterior rectus fascia contralateral to the inguinal hernia side and placed a 0-vicryl stitch through the fascia.  I placed a Hasson trocar into the preperitoneal plane.  Entry was clean.  We induced carbon dioxide insufflation. Camera inspection revealed no injury.  I used a 25mm angled scope to bluntly free the peritoneum off the infraumbilical anterior abdominal wall.  I created enough of a preperitoneal pocket to place 40mm ports into the right & left mid-abdomen into this preperitoneal cavity.  I focused attention on the RIGHT pelvis since that was the dominant hernia side.   I used blunt & focused sharp dissection to free the peritoneum off the flank and down to the pubic rim.  I freed the anteriolateral bladder wall off the anteriolateral pelvic wall, sparing midline attachments.   I located a swath of peritoneum going into a hernia fascial defect at the  internal ring consistent with  an indirect inguinal hernia..  I gradually freed the peritoneal hernia sac off safely and reduced it into the preperitoneal space.  I freed the peritoneum off the spermatic vessels & vas deferens.  I freed peritoneum off the retroperitoneum along the psoas muscle.  Spermatic cord lipoma was dissected away & removed.  I checked & assured hemostasis.     I turned attention on the opposite  LEFT pelvis.  I did dissection in a similar, mirror-image fashion. The patient had an indirect inguinal hernia.Marland Kitchen   Spermatic cord lipoma was dissected away & removed.    I checked & assured hemostasis.     I chose 15x15 cm sheets of ultra-lightweight polypropylene mesh (Ultrapro), one for each side.  I cut a single sigmoid-shaped slit ~6cm from a corner of each  mesh.  I placed the meshes into the preperitoneal space & laid them as overlapping diamonds such that at the inferior points, a 6x6 cm corner flap rested in the true anterolateral pelvis, covering the obturator & femoral foramina.   I allowed the bladder to return to the pubis, this helping tuck the corners of the mesh in the anteriolateral pelvis.  The medial corners overlapped each other across midline cephalad to the pubic rim.   Given the numerous hernias of moderate size, I placed a third 15x15cm mesh in the center as a vertical diamond.  The lateral wings of the mesh overlap across the direct spaces and internal rings where the dominant hernias were.  This provided good coverage and reinforcement of the hernia repairs.  Because of the central mesh placement with good overlap, I did not place any tacks.    I redirected the left lateral 5 mm port into the true peritoneal cavity.  Inspection.  I could see adhesions on the parietal peritoneum under the abdominal wall.   I did laparoscopic lysis of adhesions to expose the entire anterior abdominal wall.  I primarily used focused sharp dissection.  Omentum was incarcerated into Swiss cheese type hernias.  Was able to reduce most of it down.  Ended up having to excise some of it that was quite stuck and somewhat concretions consistent with probable focus of fat necrosis.  That was later removed.   I freed off  the falciform ligament and central peritoneum to expose the retrorectus fascia I entered into the preperitoneal space infraumbilically.   I made sure hemostasis was good.  I mapped out the region using a needle passer.   To ensure that I would have at least 5 cm radial coverage outside of the hernia defect, I chose a 25x20cm dual sided mesh.  I placed #1 Prolene stitches around its edge about every 5 cm = 10 total.  I rolled the mesh & placed into the peritoneal cavity through the umbilical hernia defect.  I unrolled the mesh and positioned it  appropriately.  I secured the mesh to cover up the hernia defect using a laparoscopic suture passer to pass the tails of the Prolene through the abdominal wall & tagged them with clamps for good transfascial suturing.  I started out in four corners to make sure I had the mesh centered under the hernia defect appropriately, and then proceeded to work in quadrants.    We evacuated CO2 & desufflated the abdomen.  I tied the fascial stitches down. I closed the fascial defect that I placed the mesh through using #1 PDS interrupted transverse stitches primarily.  I reinsufflated the abdomen. The mesh provided at least circumferential coverage around the entire region of hernia defects.  I secured the mesh centrally with an additional trans fascial stitch in & out the mesh using #1 PDS under laparoscopic visualization.  This allowed me to retacked the peritoneum back up to help close off the preperitoneal space in the lower abdomen.  I tacked the edges & central part of the mesh to the peritoneum/posterior rectus fascia with SecureStrap absorbable tacks.   I did reinspection. Hemostasis was good. Mesh laid well. I completed a broad field block of local anesthesia at fascial stitch sites & fascial closure areas.    Capnoperitoneum was evacuated. Ports were removed. The skin was closed with Monocryl at the port sites and Steri-Strips on the fascial stitch puncture sites.  Patient is being extubated to go to the recovery room.  I discussed operative findings, updated the patient's status, discussed probable steps to recovery, and gave postoperative recommendations to the patient's spouse.  Recommendations were made.  Questions were answered.  She expressed understanding & appreciation.  [name], M.D., F.A.C.S. Gastrointestinal and Minimally Invasive Surgery [facility] [address] [place], [place] [address] [phone] Main / Paging  10/22/2019 11:10 AM

## 2019-10-22 NOTE — Interval H&P Note (Signed)
History and Physical Interval Note:  10/22/2019 7:49 AM  Dakota Vang  has presented today for surgery, with the diagnosis of INCISIONAL ABDOMINAL WALL HERNIA. BILATERAL INGUINAL HERNIA.  The various methods of treatment have been discussed with the patient and family. After consideration of risks, benefits and other options for treatment, the patient has consented to  Procedure(s): [place]. (N/A) LAPAROSCOPIC BILATERAL INGUINAL HERNIA REPAIR WITH MESH (Bilateral) as a surgical intervention.  The patient's history has been reviewed, patient examined, no change in status, stable for surgery.  I have reviewed the patient's chart and labs.  Questions were answered to the patient's satisfaction.    I have re-reviewed the the patient's records, history, medications, and allergies.  I have re-examined the patient.  I again discussed intraoperative plans and goals of post-operative recovery.  The patient agrees to proceed.  Dakota Vang  1965/03/24 [id number]  Patient Care Team: Wenda Low, MD as PCP - General (Internal Medicine) Ladene Artist, MD as Consulting Physician (Gastroenterology) Kathlee Nations, MD as Consulting Physician (Psychiatry) Koray Boston, MD as Consulting Physician (General Surgery)  Patient Active Problem List   Diagnosis Date Noted   Incisional hernia 08/02/2019   Bilateral inguinal hernia (BIH) 08/02/2019   Diverticulitis of large intestine without perforation or abscess without bleeding    Depression with anxiety 04/14/2016   Diverticulitis 04/14/2016   Gastroesophageal reflux disease 10/23/2015   Hypertensive disorder 10/23/2015   Diverticulitis of colon with perforation 03/29/2013   Pancolonic diverticulosis 09/09/2012   Diverticulitis of jejunum with microperforation s/p lap resection [id number] 08/28/2012   Anxiety state 04/12/2009   Attention deficit disorder 11/20/2007   COLONIC POLYPS, HX OF 11/20/2007    Hyperlipidemia 06/12/2007   Essential hypertension 06/12/2007   HEMORRHOIDS, INTERNAL 11/27/2006   G E R D 09/19/2006    Past Medical History:  Diagnosis Date   ADD (attention deficit disorder with hyperactivity)    Dr. Donalee Citrin   Anxiety    Dr. Donalee Citrin   Colon polyps 2008   Dr Fuller Plan   COLONIC POLYPS, HX OF 11/20/2007   Qualifier: Diagnosis of  By: Linna Darner MD, Gwyndolyn Saxon   Last colonoscopy negative 2013 PMH of plyps X 2; Dr Fuller Plan, GI     Depression    Diverticulitis    PMH of   Diverticulitis of jejunum with microperforation s/p lap resection [id number] 08/28/2012   Qualifier: Diagnosis of  By: Linna Darner MD, Gwyndolyn Saxon   6/1-3/14 [facility] : ? Jejunal perforation F/U CT recommended 08/24/12    Diverticulitis of sigmoid colon 06/24/2008   2005 by CT scan     Diverticulosis 2002   Dr Velora Heckler   Duodenal diverticulum 08/28/2012   GERD (gastroesophageal reflux disease)    Hyperlipidemia    Hypertension    Pancolonic diverticulosis 09/09/2012    Past Surgical History:  Procedure Laterality Date   COLON SURGERY     COLONOSCOPY  2013   Dr Fuller Plan   COLONOSCOPY W/ POLYPECTOMY  2002 & 2008   Dr. Fuller Plan; polyps X 2   EYE SURGERY     LAPAROSCOPIC PARTIAL COLECTOMY N/A 09/24/2012   Procedure: laparoscopic assisted  resection of proximal jejunum containing the jejunal diverticulum;  Surgeon: [name], MD;  Location: [facility];  Service: General;  Laterality: N/A;   LAPAROSCOPIC SMALL BOWEL RESECTION  09/24/2012   excision of proximal jejunum for jejunal diverticulitis   LASIK     bilaterally   TONSILLECTOMY  Social History   Socioeconomic History   Marital status: Married    Spouse name: Not on file   Number of children: Not on file   Years of education: Not on file   Highest education level: Not on file  Occupational History   Not on file  Tobacco Use   Smoking status: Current Some Day Smoker    Packs/day: 0.25    Types: Cigarettes   Smokeless tobacco:  Never Used   Tobacco comment: smoked 1988-present ,up to 1 ppd. 08/22/11 averaging 2-3 /day  Vaping Use   Vaping Use: Never used  Substance and Sexual Activity   Alcohol use: Not Currently    Comment: ocationally   Drug use: No   Sexual activity: Yes  Other Topics Concern   Not on file  Social History Narrative   Not on file   Social Determinants of Health   Financial Resource Strain:    Difficulty of Paying Living Expenses:   Food Insecurity:    Worried About Charity fundraiser in the Last Year:    Arboriculturist in the Last Year:   Transportation Needs:    Film/video editor (Medical):    Lack of Transportation (Non-Medical):   Physical Activity:    Days of Exercise per Week:    Minutes of Exercise per Session:   Stress:    Feeling of Stress :   Social Connections:    Frequency of Communication with Friends and Family:    Frequency of Social Gatherings with Friends and Family:    Attends Religious Services:    Active Member of Clubs or Organizations:    Attends Music therapist:    Marital Status:   Intimate Partner Violence:    Fear of Current or Ex-Partner:    Emotionally Abused:    Physically Abused:    Sexually Abused:     Family History  Problem Relation Age of Onset   Colon cancer Father        in 96s   Lung cancer Father        smoker; asbestos exposure   Cancer Father        Mesothelioma   Coronary artery disease Mother        CAGB X 2 & angio 3-4X   Cancer Mother        Gallbladder   Coronary artery disease Maternal Grandmother    COPD Maternal Grandmother    Heart attack Maternal Grandfather        late 60's   Heart attack Paternal Grandfather 69   Diabetes Paternal Uncle        Dialysis   Stroke Neg Hx     Medications Prior to Admission  Medication Sig Dispense Refill Last Dose   amLODipine (NORVASC) 5 MG tablet Take 5 mg by mouth daily.   10/22/2019 at 0430   ARIPiprazole (ABILIFY) 10 MG tablet Take 1 tablet (10 mg  total) by mouth daily. 90 tablet 0 10/22/2019 at 0430   aspirin EC 81 MG tablet Take 81 mg by mouth daily.    10/22/2019 at 0430   buPROPion (WELLBUTRIN XL) 300 MG 24 hr tablet Take 1 tablet (300 mg total) by mouth daily. 90 tablet 0 10/22/2019 at 0430   ibuprofen (ADVIL) 200 MG tablet Take 400 mg by mouth every 8 (eight) hours as needed (headaches.).   Past Month at Unknown time   lamoTRIgine (LAMICTAL) 150 MG tablet Take 1 tablet (150 mg total) by  mouth 2 (two) times daily. 180 tablet 0 10/22/2019 at 0430   methylphenidate (RITALIN) 20 MG tablet Take 1 tablet (20 mg total) by mouth 4 (four) times daily. 120 tablet 0 10/21/2019 at Unknown time   omeprazole (PRILOSEC) 20 MG capsule Take 20 mg by mouth daily before breakfast.    10/22/2019 at 0430   ramipril (ALTACE) 10 MG capsule Take 10 mg by mouth daily.   10/22/2019 at 0430   rosuvastatin (CRESTOR) 10 MG tablet Take 1 tablet (10 mg total) by mouth daily. 90 tablet 1 10/22/2019 at 0430   clindamycin (CLEOCIN T) 1 % external solution Apply 1 application topically as needed (for breakouts on neck).    Unknown at Unknown time   fluticasone (FLONASE) 50 MCG/ACT nasal spray Place 2 sprays into both nostrils daily as needed (seasonal allergies.).    Unknown at Unknown time    Current Facility-Administered Medications  Medication Dose Route Frequency Provider Last Rate Last Admin   bupivacaine liposome (EXPAREL) 1.3 % injection 266 mg  20 mL Infiltration Once Ethyn Boston, MD       Chlorhexidine Gluconate Cloth 2 % PADS 6 each  6 each Topical Once Moua Boston, MD       And   Chlorhexidine Gluconate Cloth 2 % PADS 6 each  6 each Topical Once Yerick Boston, MD       Derrill Memo ON 10/23/2019] feeding supplement (ENSURE PRE-SURGERY) liquid 296 mL  296 mL Oral Once Jarred Boston, MD       feeding supplement (ENSURE PRE-SURGERY) liquid 592 mL  592 mL Oral Once Deadrick Boston, MD       gentamicin (GARAMYCIN) 440 mg in dextrose 5 % 100 mL IVPB  440 mg Intravenous On  Call to OR Khyre Boston, MD       lactated ringers infusion   Intravenous Continuous Myrtie Soman, MD 10 mL/hr at 10/22/19 0623 New Bag at 10/22/19 0488     Allergies  Allergen Reactions   Morphine And Related     hallucinates   Penicillins Other (See Comments)    Reaction:  Unknown  Has patient had a PCN reaction causing immediate rash, facial/tongue/throat swelling, SOB or lightheadedness with hypotension: Unsure Has patient had a PCN reaction causing severe rash involving mucus membranes or skin necrosis: Unsure Has patient had a PCN reaction that required hospitalization Unsure Has patient had a PCN reaction occurring within the last 10 years: No If all of the above answers are "NO", then may proceed with Cephalosporin use.    BP (!) 150/90   Pulse (!) 101   Temp 98 F (36.7 C) (Oral)   Resp 17   Ht 6\' 1"  (1.854 m)   Wt 104.8 kg   SpO2 99%   BMI 30.48 kg/m   Labs: No results found for this or any previous visit (from the past 48 hour(s)).  Imaging / Studies: No results found.   [name], M.D., F.A.C.S. Gastrointestinal and Minimally Invasive Surgery [facility] [address], [place],  [address] [phone] Main / Paging  10/22/2019 7:49 AM    [name]

## 2019-10-22 NOTE — Anesthesia Procedure Notes (Signed)
Procedure Name: Intubation Date/Time: 10/22/2019 8:29 AM Performed by: Lavina Hamman, CRNA Pre-anesthesia Checklist: Patient identified, Emergency Drugs available, Suction available, Patient being monitored and Timeout performed Patient Re-evaluated:Patient Re-evaluated prior to induction Oxygen Delivery Method: Circle system utilized Preoxygenation: Pre-oxygenation with 100% oxygen Induction Type: IV induction Ventilation: Mask ventilation without difficulty Laryngoscope Size: Mac and 4 Grade View: Grade II Tube type: Oral Tube size: 7.5 mm Number of attempts: 1 Airway Equipment and Method: Stylet Placement Confirmation: ETT inserted through vocal cords under direct vision,  positive ETCO2,  CO2 detector and breath sounds checked- equal and bilateral Secured at: 23 cm Tube secured with: Tape Dental Injury: Teeth and Oropharynx as per pre-operative assessment  Comments: ATOI, teeth unchanged.

## 2019-10-22 NOTE — Transfer of Care (Signed)
Immediate Anesthesia Transfer of Care Note  Patient: Dakota Vang  Procedure(s) Performed: LAPAROSCOPIC incarcerated VENTRAL WALL HERNIA REPAIR WITH MESH. tap block (N/A Abdomen) LAPAROSCOPIC BILATERAL INGUINAL HERNIA REPAIR WITH MESH (Bilateral Inguinal)  Patient Location: PACU  Anesthesia Type:General  Level of Consciousness: awake, oriented, patient cooperative and responds to stimulation  Airway & Oxygen Therapy: Patient Spontanous Breathing and Patient connected to face mask oxygen  Post-op Assessment: Report given to RN and Post -op Vital signs reviewed and stable  Post vital signs: Reviewed and stable  Last Vitals:  Vitals Value Taken Time  BP 178/118 10/22/19 1055  Temp    Pulse 101 10/22/19 1058  Resp 15 10/22/19 1058  SpO2 100 % 10/22/19 1058  Vitals shown include unvalidated device data.  Last Pain:  Vitals:   10/22/19 0620  TempSrc:   PainSc: 0-No pain      Patients Stated Pain Goal: 4 (88/28/00 3491)  Complications: No complications documented.

## 2019-10-22 NOTE — Discharge Instructions (Signed)
HERNIA REPAIR: POST OP INSTRUCTIONS  ######################################################################  EAT Gradually transition to a high fiber diet with a fiber supplement over the next few weeks after discharge.  Start with a pureed / full liquid diet (see below)  WALK Walk an hour a day.  Control your pain to do that.    CONTROL PAIN Control pain so that you can walk, sleep, tolerate sneezing/coughing, and go up/down stairs.  HAVE A BOWEL MOVEMENT DAILY Keep your bowels regular to avoid problems.  OK to try a laxative to override constipation.  OK to use an antidairrheal to slow down diarrhea.  Call if not better after 2 tries  CALL IF YOU HAVE PROBLEMS/CONCERNS Call if you are still struggling despite following these instructions. Call if you have concerns not answered by these instructions  ######################################################################    1. DIET: Follow a light bland diet & liquids the first 24 hours after arrival home, such as soup, liquids, starches, etc.  Be sure to drink plenty of fluids.  Quickly advance to a usual solid diet within a few days.  Avoid fast food or heavy meals as your are more likely to get nauseated or have irregular bowels.  A low-fat, high-fiber diet for the rest of your life is ideal.   2. Take your usually prescribed home medications unless otherwise directed.  3. PAIN CONTROL: a. Pain is best controlled by a usual combination of three different methods TOGETHER: i. Ice/Heat ii. Over the counter pain medication iii. Prescription pain medication b. Most patients will experience some swelling and bruising around the hernia(s) such as the bellybutton, groins, or old incisions.  Ice packs or heating pads (30-60 minutes up to 6 times a day) will help. Use ice for the first few days to help decrease swelling and bruising, then switch to heat to help relax tight/sore spots and speed recovery.  Some people prefer to use ice  alone, heat alone, alternating between ice & heat.  Experiment to what works for you.  Swelling and bruising can take several weeks to resolve.   c. It is helpful to take an over-the-counter pain medication regularly for the first few weeks.  Choose one of the following that works best for you: i. Naproxen (Aleve, etc)  Two 244m tabs twice a day ii. Ibuprofen (Advil, etc) Three 2036mtabs four times a day (every meal & bedtime) iii. Acetaminophen (Tylenol, etc) 325-65016mour times a day (every meal & bedtime) d. A  prescription for pain medication should be given to you upon discharge.  Take your pain medication as prescribed.  i. If you are having problems/concerns with the prescription medicine (does not control pain, nausea, vomiting, rash, itching, etc), please call us [phone] see if we need to switch you to a different pain medicine that will work better for you and/or control your side effect better. ii. If you need a refill on your pain medication, please contact your pharmacy.  They will contact our office to request authorization. Prescriptions will not be filled after 5 pm or on week-ends.  4. Avoid getting constipated.  Between the surgery and the pain medications, it is common to experience some constipation.  Increasing fluid intake and taking a fiber supplement (such as Metamucil, Citrucel, FiberCon, MiraLax, etc) 1-2 times a day regularly will usually help prevent this problem from occurring.  A mild laxative (prune juice, Milk of Magnesia, MiraLax, etc) should be taken according to package directions if there are no bowel movements after 48  hours.    5. Wash / shower every day.  You may shower over the dressings as they are waterproof.    6. Remove your waterproof bandages, skin tapes, and other bandages 5 days after surgery. You may replace a dressing/Band-Aid to cover the incision for comfort if you wish. You may leave the incisions open to air.  You may replace a  dressing/Band-Aid to cover an incision for comfort if you wish.  Continue to shower over incision(s) after the dressing is off.  7. ACTIVITIES as tolerated:   a. You may resume regular (light) daily activities beginning the next day--such as daily self-care, walking, climbing stairs--gradually increasing activities as tolerated.  Control your pain so that you can walk an hour a day.  If you can walk 30 minutes without difficulty, it is safe to try more intense activity such as jogging, treadmill, bicycling, low-impact aerobics, swimming, etc. b. Save the most intensive and strenuous activity for last such as sit-ups, heavy lifting, contact sports, etc  Refrain from any heavy lifting or straining until you are off narcotics for pain control.   c. DO NOT PUSH THROUGH PAIN.  Let pain be your guide: If it hurts to do something, don't do it.  Pain is your body warning you to avoid that activity for another week until the pain goes down. d. You may drive when you are no longer taking prescription pain medication, you can comfortably wear a seatbelt, and you can safely maneuver your car and apply brakes. e. Dennis Bast may have sexual intercourse when it is comfortable.   8. FOLLOW UP in our office a. Please call [facility] at [phone] to set up an appointment to see your surgeon in the office for a follow-up appointment approximately 2-3 weeks after your surgery. b. Make sure that you call for this appointment the day you arrive home to insure a convenient appointment time.  9.  If you have disability of FMLA / Family leave forms, please bring the forms to the office for processing.  (do not give to your surgeon).  WHEN TO CALL us [phone]: 1. Poor pain control 2. Reactions / problems with new medications (rash/itching, nausea, etc)  3. Fever over 101.5 F (38.5 C) 4. Inability to urinate 5. Nausea and/or vomiting 6. Worsening swelling or bruising 7. Continued bleeding from incision. 8. Increased pain,  redness, or drainage from the incision   The clinic staff is available to answer your questions during regular business hours (8:30am-5pm).  Please don't hesitate to call and ask to speak to one of our nurses for clinical concerns.   If you have a medical emergency, go to the nearest emergency room or call 911.  A surgeon from [facility] is always on call at the hospitals in [facility], [place], [place], [place], [place]  [address] ?  [address] [address], [place], [place]   [address] MAIN: [phone] ? TOLL FREE: [phone] ? FAX: [phone] [email or web address]    General Anesthesia, Adult, Care After This sheet gives you information about how to care for yourself after your procedure. Your health care provider may also give you more specific instructions. If you have problems or questions, contact your health care provider. What can I expect after the procedure? After the procedure, the following side effects are common:  Pain or discomfort at the IV site.  Nausea.  Vomiting.  Sore throat.  Trouble concentrating.  Feeling cold or chills.  Weak or tired.  Sleepiness and fatigue.  Soreness and body aches. These side effects can affect parts of the body that were not involved in surgery. Follow these instructions at home:  For at least 24 hours after the procedure:  Have a responsible adult stay with you. It is important to have someone help care for you until you are awake and alert.  Rest as needed.  Do not: ? Participate in activities in which you could fall or become injured. ? Drive. ? Use heavy machinery. ? Drink alcohol. ? Take sleeping pills or medicines that cause drowsiness. ? Make important decisions or sign legal documents. ? Take care of children on your own. Eating and drinking  Follow any instructions from your health care provider about eating or drinking restrictions.  When you  feel hungry, start by eating small amounts of foods that are soft and easy to digest (bland), such as toast. Gradually return to your regular diet.  Drink enough fluid to keep your urine pale yellow.  If you vomit, rehydrate by drinking water, juice, or clear broth. General instructions  If you have sleep apnea, surgery and certain medicines can increase your risk for breathing problems. Follow instructions from your health care provider about wearing your sleep device: ? Anytime you are sleeping, including during daytime naps. ? While taking prescription pain medicines, sleeping medicines, or medicines that make you drowsy.  Return to your normal activities as told by your health care provider. Ask your health care provider what activities are safe for you.  Take over-the-counter and prescription medicines only as told by your health care provider.  If you smoke, do not smoke without supervision.  Keep all follow-up visits as told by your health care provider. This is important. Contact a health care provider if:  You have nausea or vomiting that does not get better with medicine.  You cannot eat or drink without vomiting.  You have pain that does not get better with medicine.  You are unable to pass urine.  You develop a skin rash.  You have a fever.  You have redness around your IV site that gets worse. Get help right away if:  You have difficulty breathing.  You have chest pain.  You have blood in your urine or stool, or you vomit blood. Summary  After the procedure, it is common to have a sore throat or nausea. It is also common to feel tired.  Have a responsible adult stay with you for the first 24 hours after general anesthesia. It is important to have someone help care for you until you are awake and alert.  When you feel hungry, start by eating small amounts of foods that are soft and easy to digest (bland), such as toast. Gradually return to your regular  diet.  Drink enough fluid to keep your urine pale yellow.  Return to your normal activities as told by your health care provider. Ask your health care provider what activities are safe for you. This information is not intended to replace advice given to you by your health care provider. Make sure you discuss any questions you have with your health care provider. Document Revised: 02/28/2017 Document Reviewed: 10/11/2016 Elsevier Patient Education  [place].

## 2019-10-22 NOTE — Anesthesia Postprocedure Evaluation (Signed)
Anesthesia Post Note  Patient: Evann Erazo Miklas  Procedure(s) Performed: LAPAROSCOPIC incarcerated VENTRAL WALL HERNIA REPAIR WITH MESH. tap block (N/A Abdomen) LAPAROSCOPIC BILATERAL INGUINAL HERNIA REPAIR WITH MESH (Bilateral Inguinal)     Patient location during evaluation: PACU Anesthesia Type: General Level of consciousness: awake and alert, awake and oriented Pain management: pain level controlled Vital Signs Assessment: post-procedure vital signs reviewed and stable Respiratory status: spontaneous breathing, nonlabored ventilation and respiratory function stable Cardiovascular status: blood pressure returned to baseline and stable Postop Assessment: no apparent nausea or vomiting Anesthetic complications: no   No complications documented.  Last Vitals:  Vitals:   10/22/19 1315 10/22/19 1347  BP: (!) 136/98 (!) 138/94  Pulse: 92 86  Resp: 12 14  Temp: 36.4 C   SpO2: 99% 96%    Last Pain:  Vitals:   10/22/19 1348  TempSrc:   PainSc: 5                  Catalina Gravel
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
## 2019-10-25 ENCOUNTER — Encounter (HOSPITAL_COMMUNITY): Payer: Self-pay | Admitting: Surgery

## 2019-11-19 ENCOUNTER — Telehealth (HOSPITAL_COMMUNITY): Payer: Self-pay | Admitting: *Deleted

## 2019-11-19 DIAGNOSIS — F900 Attention-deficit hyperactivity disorder, predominantly inattentive type: Secondary | ICD-10-CM

## 2019-11-19 MED ORDER — METHYLPHENIDATE HCL 20 MG PO TABS: 20.0000 mg | ORAL_TABLET | Freq: Four times a day (QID) | ORAL | 0 refills | Status: DC

## 2019-11-19 NOTE — Telephone Encounter (Signed)
Pt called requesting refill of the methylphenidate 10 mg. Pt has an appointment upcoming on 01/03/20. Please review.

## 2019-11-19 NOTE — Telephone Encounter (Signed)
Done

## 2019-12-20 ENCOUNTER — Telehealth (HOSPITAL_COMMUNITY): Payer: Self-pay | Admitting: *Deleted

## 2019-12-20 DIAGNOSIS — F900 Attention-deficit hyperactivity disorder, predominantly inattentive type: Secondary | ICD-10-CM

## 2019-12-20 MED ORDER — METHYLPHENIDATE HCL 20 MG PO TABS: 20.0000 mg | ORAL_TABLET | Freq: Four times a day (QID) | ORAL | 0 refills | Status: DC

## 2019-12-20 NOTE — Telephone Encounter (Signed)
Pt called requesting refill of the Ritalin 20mg  qid. Pt has an appointment on 01/03/20. Please review.

## 2019-12-20 NOTE — Telephone Encounter (Signed)
Done

## 2020-01-03 ENCOUNTER — Encounter (HOSPITAL_COMMUNITY): Payer: Self-pay | Admitting: Psychiatry

## 2020-01-03 ENCOUNTER — Other Ambulatory Visit: Payer: Self-pay

## 2020-01-03 ENCOUNTER — Telehealth (INDEPENDENT_AMBULATORY_CARE_PROVIDER_SITE_OTHER): Payer: 59 | Admitting: Psychiatry

## 2020-01-03 DIAGNOSIS — F411 Generalized anxiety disorder: Secondary | ICD-10-CM | POA: Diagnosis not present

## 2020-01-03 DIAGNOSIS — F330 Major depressive disorder, recurrent, mild: Secondary | ICD-10-CM | POA: Diagnosis not present

## 2020-01-03 DIAGNOSIS — F900 Attention-deficit hyperactivity disorder, predominantly inattentive type: Secondary | ICD-10-CM | POA: Diagnosis not present

## 2020-01-03 MED ORDER — ARIPIPRAZOLE 10 MG PO TABS: 10.0000 mg | ORAL_TABLET | Freq: Every day | ORAL | 0 refills | Status: DC

## 2020-01-03 MED ORDER — METHYLPHENIDATE HCL 20 MG PO TABS: 20.0000 mg | ORAL_TABLET | Freq: Four times a day (QID) | ORAL | 0 refills | Status: DC

## 2020-01-03 MED ORDER — LAMOTRIGINE 150 MG PO TABS: 150.0000 mg | ORAL_TABLET | Freq: Two times a day (BID) | ORAL | 0 refills | Status: DC

## 2020-01-03 MED ORDER — BUPROPION HCL ER (XL) 300 MG PO TB24: 300.0000 mg | ORAL_TABLET | Freq: Every day | ORAL | 0 refills | Status: DC

## 2020-01-03 NOTE — Progress Notes (Signed)
Virtual Visit via Telephone Note  I connected with Dakota Vang on 01/03/20 at  3:40 PM EDT by telephone and verified that I am speaking with the correct person using two identifiers.  Location: Patient: home Provider: home office   I discussed the limitations, risks, security and privacy concerns of performing an evaluation and management service by telephone and the availability of in person appointments. I also discussed with the patient that there may be a patient responsible charge related to this service. The patient expressed understanding and agreed to proceed.   History of Present Illness: Patient is evaluated by phone session.  He had a hernia surgery in August and he reported he was in a lot of pain but now he is feeling better and he is back to work.  He is somewhat anxious about his job because company is now locked by another company and he is not sure what would be the future.  So far there has been no major changes.  He is sleeping okay.  His attention, concentration is good.  He is able to do multitasking.  He is no longer taking any pain medicine since he is fully recovered from surgery.  He had a good summer.  [place]  He has no tremors, shakes or any EPS.  After the surgery he had lost 10 pounds because he has no desire to eat but now he picked up some weight.  He feels that losing weight was good because he is more active and he has more energy.  He denies drinking or using any illegal substances.  He is compliant with Abilify, Wellbutrin, Lamictal and Ritalin.  He has blood work and his hemoglobin A1c is 5.9.  His other labs are normal.  Past Psychiatric History: No h/oinpatient treatment or suicidal attempt. Tried Prozac for many years until it stopped working. We tried Cymbalta, Klonopin, BuSparand Lexapro but that did not help. History of ADD. No history of paranoia, hallucination or psychosis.  Recent Results (from the past 2160  hour(s))  Basic metabolic panel per protocol     Status: Abnormal   Collection Time: 10/13/19  2:39 PM  Result Value Ref Range   Sodium 140 135 - 145 mmol/L   Potassium 4.4 3.5 - 5.1 mmol/L   Chloride 103 98 - 111 mmol/L   CO2 25 22 - 32 mmol/L   Glucose, Bld 137 (H) 70 - 99 mg/dL    Comment: Glucose reference range applies only to samples taken after fasting for at least 8 hours.   BUN 13 6 - 20 mg/dL   Creatinine, Ser 1.08 0.61 - 1.24 mg/dL   Calcium 9.6 8.9 - 10.3 mg/dL   GFR calc non Af Amer >60 >60 mL/min   GFR calc Af Amer >60 >60 mL/min   Anion gap 12 5 - 15    Comment: Performed at [facility], [place] [address] [place], [place] [address]  CBC per protocol     Status: None   Collection Time: 10/13/19  2:39 PM  Result Value Ref Range   WBC 10.0 4.0 - 10.5 K/uL   RBC 5.44 4.22 - 5.81 MIL/uL   Hemoglobin 16.1 13.0 - 17.0 g/dL   HCT 46.5 39 - 52 %   MCV 85.5 80.0 - 100.0 fL   MCH 29.6 26.0 - 34.0 pg   MCHC 34.6 30.0 - 36.0 g/dL   RDW 12.5 11.5 - 15.5 %   Platelets 214 150 -  400 K/uL   nRBC 0.0 0.0 - 0.2 %    Comment: Performed at [facility], [place] [address], [place], [place] [address]  Hemoglobin A1c     Status: Abnormal   Collection Time: 10/13/19  2:39 PM  Result Value Ref Range   Hgb A1c MFr Bld 5.9 (H) 4.8 - 5.6 %    Comment: (NOTE) Pre diabetes:          5.7%-6.4%  Diabetes:              >6.4%  Glycemic control for   <7.0% adults with diabetes    Mean Plasma Glucose 122.63 mg/dL    Comment: Performed at [facility] [address] [place], [place] [address]  SARS CORONAVIRUS 2 (TAT 6-24 HRS) Nasopharyngeal Nasopharyngeal Swab     Status: None   Collection Time: 10/19/19  2:43 PM   Specimen: Nasopharyngeal Swab  Result Value Ref Range   SARS Coronavirus 2 NEGATIVE NEGATIVE    Comment: (NOTE) SARS-CoV-2 target nucleic acids are NOT DETECTED.  The SARS-CoV-2 RNA is generally detectable in upper and  lower respiratory specimens during the acute phase of infection. Negative results do not preclude SARS-CoV-2 infection, do not rule out co-infections with other pathogens, and should not be used as the sole basis for treatment or other patient management decisions. Negative results must be combined with clinical observations, patient history, and epidemiological information. The expected result is Negative.  Fact Sheet for Patients: SugarRoll.be  Fact Sheet for Healthcare Providers: https://www.woods-mathews.com/  This test is not yet approved or cleared by the [place] FDA and  has been authorized for detection and/or diagnosis of SARS-CoV-2 by FDA under an Emergency Use Authorization (EUA). This EUA will remain  in effect (meaning this test can be used) for the duration of the COVID-19 declaration under Se ction 564(b)(1) of the Act, 21 U.S.C. section 360bbb-3(b)(1), unless the authorization is terminated or revoked sooner.  Performed at [facility], [place] [address] [place], [place] [address]   Type and screen [place]     Status: None   Collection Time: 10/22/19  6:15 AM  Result Value Ref Range   ABO/RH(D) B NEG    Antibody Screen NEG    Sample Expiration      10/25/2019,2359 Performed at [facility], [place] [address], [place], [place] [address]       Psychiatric Specialty Exam: Physical Exam  Review of Systems  Weight 224 lb (101.6 kg).There is no height or weight on file to calculate BMI.  General Appearance: NA  Eye Contact:  NA  Speech:  Clear and Coherent  Volume:  Normal  Mood:  Anxious  Affect:  NA  Thought Process:  Goal Directed  Orientation:  Full (Time, Place, and Person)  Thought Content:  Logical  Suicidal Thoughts:  No  Homicidal Thoughts:  No  Memory:  Immediate;   Good Recent;   Good Remote;   Good  Judgement:  Good  Insight:  Good  Psychomotor  Activity:  NA  Concentration:  Concentration: Good and Attention Span: Good  Recall:  Good  Fund of Knowledge:  Good  Language:  Good  Akathisia:  No  Handed:  Right  AIMS (if indicated):     Assets:  Communication Skills Desire for [place] Talents/Skills Transportation  ADL's:  Intact  Cognition:  WNL  Sleep:   good      Assessment  and Plan: Major depressive disorder, recurrent.  Anxiety.  ADD, inattentive type.  Review blood work results.  His hemoglobin A1c is 5.9.  He had lost weight after the surgery but feeling good.  He does not want to change medication since he feels they are working very well.  Continue Reglan 20 mg 4 times a day, Abilify 10 mg daily, Wellbutrin XL 300 mg daily and Lamictal 300 mg daily.  He has no rash, itching tremors or shakes.  Recommended to call us back if is any question or any concern.  Follow-up in 3 months.  Follow Up Instructions:    I discussed the assessment and treatment plan with the patient. The patient was provided an opportunity to ask questions and all were answered. The patient agreed with the plan and demonstrated an understanding of the instructions.   The patient was advised to call back or seek an in-person evaluation if the symptoms worsen or if the condition fails to improve as anticipated.  I provided 12 minutes of non-face-to-face time during this encounter.   Kathlee Nations, MD

## 2020-02-18 ENCOUNTER — Telehealth (HOSPITAL_COMMUNITY): Payer: Self-pay | Admitting: *Deleted

## 2020-02-18 DIAGNOSIS — F900 Attention-deficit hyperactivity disorder, predominantly inattentive type: Secondary | ICD-10-CM

## 2020-02-18 MED ORDER — METHYLPHENIDATE HCL 20 MG PO TABS: 20.0000 mg | ORAL_TABLET | Freq: Four times a day (QID) | ORAL | 0 refills | Status: DC

## 2020-02-18 NOTE — Telephone Encounter (Signed)
Done

## 2020-02-18 NOTE — Telephone Encounter (Signed)
Pt called requesting a refill of the Ritalin. He has an upcoming appointment for 03/29/20.

## 2020-02-27 ENCOUNTER — Other Ambulatory Visit (HOSPITAL_COMMUNITY): Payer: Self-pay | Admitting: Psychiatry

## 2020-02-27 DIAGNOSIS — F330 Major depressive disorder, recurrent, mild: Secondary | ICD-10-CM

## 2020-02-27 DIAGNOSIS — F411 Generalized anxiety disorder: Secondary | ICD-10-CM

## 2020-03-10 ENCOUNTER — Other Ambulatory Visit (HOSPITAL_COMMUNITY): Payer: Self-pay | Admitting: Psychiatry

## 2020-03-10 DIAGNOSIS — F330 Major depressive disorder, recurrent, mild: Secondary | ICD-10-CM

## 2020-03-14 ENCOUNTER — Other Ambulatory Visit (HOSPITAL_COMMUNITY): Payer: Self-pay | Admitting: Psychiatry

## 2020-03-14 DIAGNOSIS — F330 Major depressive disorder, recurrent, mild: Secondary | ICD-10-CM

## 2020-03-14 DIAGNOSIS — F411 Generalized anxiety disorder: Secondary | ICD-10-CM

## 2020-03-17 ENCOUNTER — Telehealth (HOSPITAL_COMMUNITY): Payer: Self-pay | Admitting: *Deleted

## 2020-03-17 DIAGNOSIS — F900 Attention-deficit hyperactivity disorder, predominantly inattentive type: Secondary | ICD-10-CM

## 2020-03-17 NOTE — Telephone Encounter (Signed)
Pt called requesting refill of the Ritalin 20 mg last ordered on 02/18/20. Pt has an appointment upcoming on 03/29/20. Please review.

## 2020-03-20 MED ORDER — METHYLPHENIDATE HCL 20 MG PO TABS: 20.0000 mg | ORAL_TABLET | Freq: Four times a day (QID) | ORAL | 0 refills | Status: DC

## 2020-03-20 NOTE — Telephone Encounter (Signed)
Done

## 2020-03-29 ENCOUNTER — Encounter (HOSPITAL_COMMUNITY): Payer: Self-pay | Admitting: Psychiatry

## 2020-03-29 ENCOUNTER — Other Ambulatory Visit: Payer: Self-pay

## 2020-03-29 ENCOUNTER — Telehealth (INDEPENDENT_AMBULATORY_CARE_PROVIDER_SITE_OTHER): Payer: 59 | Admitting: Psychiatry

## 2020-03-29 DIAGNOSIS — F900 Attention-deficit hyperactivity disorder, predominantly inattentive type: Secondary | ICD-10-CM | POA: Diagnosis not present

## 2020-03-29 DIAGNOSIS — F330 Major depressive disorder, recurrent, mild: Secondary | ICD-10-CM | POA: Diagnosis not present

## 2020-03-29 DIAGNOSIS — F411 Generalized anxiety disorder: Secondary | ICD-10-CM | POA: Diagnosis not present

## 2020-03-29 MED ORDER — BUPROPION HCL ER (XL) 300 MG PO TB24: 300.0000 mg | ORAL_TABLET | Freq: Every day | ORAL | 0 refills | Status: DC

## 2020-03-29 MED ORDER — ARIPIPRAZOLE 5 MG PO TABS: 5.0000 mg | ORAL_TABLET | Freq: Every day | ORAL | 0 refills | Status: DC

## 2020-03-29 MED ORDER — LAMOTRIGINE 150 MG PO TABS: 150.0000 mg | ORAL_TABLET | Freq: Two times a day (BID) | ORAL | 0 refills | Status: DC

## 2020-03-29 MED ORDER — METHYLPHENIDATE HCL 20 MG PO TABS: 20.0000 mg | ORAL_TABLET | Freq: Four times a day (QID) | ORAL | 0 refills | Status: DC

## 2020-03-29 NOTE — Progress Notes (Signed)
Virtual Visit via Telephone Note  I connected with Dakota Vang on 03/29/20 at  3:00 PM EST by telephone and verified that I am speaking with the correct person using two identifiers.  Location: Patient: work Provider: home office   I discussed the limitations, risks, security and privacy concerns of performing an evaluation and management service by telephone and the availability of in person appointments. I also discussed with the patient that there may be a patient responsible charge related to this service. The patient expressed understanding and agreed to proceed.   History of Present Illness: Patient is evaluated by phone session.  He is on the phone by himself.  Recently he had a visit with his PCP Dr. Deforest Hoyles.  He was told that his blood sugar is slightly high and recommended to cut down Abilify half dose.  He is taking 5 mg for 6 weeks.  He noticed sometimes irritability and frustration but he can handle the symptoms.  He wanted to try a different medication however I explained that he has been doing well with the dose and if he continues to feel his irritability and agitation is getting worse then we can think about switching the medication.  We do not have the blood results.  His last hemoglobin A1c was 5.9.  Overall he is sleeping good.  His attention concentration is good.  His appetite is okay and his weight is not changed from the past.  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  He has no tremors, shakes or any EPS.  He denies drinking or using any illegal substances.     Past Psychiatric History: No h/oinpatient treatment or suicidal attempt. Tried Prozac for many years until it stopped working. We tried Cymbalta, Klonopin, BuSparand Lexapro but that did not help. History of ADD. No history of paranoia, hallucination or psychosis.  Psychiatric Specialty Exam: Physical Exam  Review of Systems  Weight 225 lb (102.1  kg).There is no height or weight on file to calculate BMI.  General Appearance: NA  Eye Contact:  NA  Speech:  Normal Rate  Volume:  Normal  Mood:  Irritable  Affect:  NA  Thought Process:  Goal Directed  Orientation:  Full (Time, Place, and Person)  Thought Content:  Logical  Suicidal Thoughts:  No  Homicidal Thoughts:  No  Memory:  Immediate;   Good Recent;   Good Remote;   Good  Judgement:  Intact  Insight:  Present  Psychomotor Activity:  NA  Concentration:  Concentration: Good and Attention Span: Good  Recall:  Good  Fund of Knowledge:  Good  Language:  Good  Akathisia:  No  Handed:  Right  AIMS (if indicated):     Assets:  Communication Skills Desire for Improvement Housing Resilience Social Support Talents/Skills Transportation Vocational/Educational  ADL's:  Intact  Cognition:  WNL  Sleep:   good      Assessment and Plan: Major depressive disorder, recurrent.  Anxiety.  Attention deficit disorder, inattentive type.  Discussed Abilify dose.  We do not have blood work results and his last hemoglobin A1c back in August was 5.9.  He is taking 5 mg with a concern of high blood sugar.  We will get his blood work to see how much hemoglobin A1c is increased.  So far he is tolerating 5 mg and episodic irritability but denies any major concerns or relapse of the symptoms.  We will consider switching his Abilify if  patient continue to have high blood sugar and symptoms continue to persist to worse.  He agreed with the plan.  For now continue Ritalin 20 mg 4 times a day, Wellbutrin XL 300 mg daily, Lamictal 300 mg daily and reduce Abilify 5 mg daily.  I recommended to call us back if he has any question, concern if any worsening of the symptoms.  He has no rash or any itching.  Follow-up in 3 months.  Like [facility] except stimulant which he preferred to local pharmacy.     Follow Up Instructions:    I discussed the assessment and treatment  plan with the patient. The patient was provided an opportunity to ask questions and all were answered. The patient agreed with the plan and demonstrated an understanding of the instructions.   The patient was advised to call back or seek an in-person evaluation if the symptoms worsen or if the condition fails to improve as anticipated.  I provided 20 minutes of non-face-to-face time during this encounter.   Kathlee Nations, MD

## 2020-05-11 ENCOUNTER — Encounter: Payer: Self-pay | Admitting: *Deleted

## 2020-05-17 ENCOUNTER — Telehealth (HOSPITAL_COMMUNITY): Payer: Self-pay | Admitting: *Deleted

## 2020-05-17 DIAGNOSIS — F900 Attention-deficit hyperactivity disorder, predominantly inattentive type: Secondary | ICD-10-CM

## 2020-05-17 NOTE — Telephone Encounter (Signed)
Pt called requesting refill of the Ritalin last written for #120, pt takes qid, on 03/29/20. Pt has an upcoming appointment on 06/27/20.

## 2020-05-18 MED ORDER — METHYLPHENIDATE HCL 20 MG PO TABS: 20.0000 mg | ORAL_TABLET | Freq: Four times a day (QID) | ORAL | 0 refills | Status: DC

## 2020-05-18 NOTE — Telephone Encounter (Signed)
Done

## 2020-05-21 ENCOUNTER — Other Ambulatory Visit (HOSPITAL_COMMUNITY): Payer: Self-pay | Admitting: Psychiatry

## 2020-05-21 DIAGNOSIS — F411 Generalized anxiety disorder: Secondary | ICD-10-CM

## 2020-05-21 DIAGNOSIS — F330 Major depressive disorder, recurrent, mild: Secondary | ICD-10-CM

## 2020-05-22 ENCOUNTER — Other Ambulatory Visit (HOSPITAL_COMMUNITY): Payer: Self-pay | Admitting: Psychiatry

## 2020-05-22 DIAGNOSIS — F411 Generalized anxiety disorder: Secondary | ICD-10-CM

## 2020-05-22 DIAGNOSIS — F330 Major depressive disorder, recurrent, mild: Secondary | ICD-10-CM

## 2020-06-04 ENCOUNTER — Other Ambulatory Visit (HOSPITAL_COMMUNITY): Payer: Self-pay | Admitting: Psychiatry

## 2020-06-04 DIAGNOSIS — F330 Major depressive disorder, recurrent, mild: Secondary | ICD-10-CM

## 2020-06-16 ENCOUNTER — Telehealth (HOSPITAL_COMMUNITY): Payer: Self-pay | Admitting: *Deleted

## 2020-06-16 DIAGNOSIS — F900 Attention-deficit hyperactivity disorder, predominantly inattentive type: Secondary | ICD-10-CM

## 2020-06-16 MED ORDER — METHYLPHENIDATE HCL 20 MG PO TABS: 20.0000 mg | ORAL_TABLET | Freq: Four times a day (QID) | ORAL | 0 refills | Status: DC

## 2020-06-16 NOTE — Telephone Encounter (Signed)
Pt called requesting refill of Ritalin 20 mg. Pt has an appointment scheduled for 06/27/20.

## 2020-06-16 NOTE — Telephone Encounter (Signed)
Done

## 2020-06-27 ENCOUNTER — Telehealth (INDEPENDENT_AMBULATORY_CARE_PROVIDER_SITE_OTHER): Payer: 59 | Admitting: Psychiatry

## 2020-06-27 ENCOUNTER — Other Ambulatory Visit: Payer: Self-pay

## 2020-06-27 ENCOUNTER — Encounter (HOSPITAL_COMMUNITY): Payer: Self-pay | Admitting: Psychiatry

## 2020-06-27 DIAGNOSIS — F411 Generalized anxiety disorder: Secondary | ICD-10-CM

## 2020-06-27 DIAGNOSIS — F330 Major depressive disorder, recurrent, mild: Secondary | ICD-10-CM | POA: Diagnosis not present

## 2020-06-27 DIAGNOSIS — F900 Attention-deficit hyperactivity disorder, predominantly inattentive type: Secondary | ICD-10-CM | POA: Diagnosis not present

## 2020-06-27 MED ORDER — ARIPIPRAZOLE 5 MG PO TABS: 5.0000 mg | ORAL_TABLET | Freq: Every day | ORAL | 0 refills | Status: DC

## 2020-06-27 MED ORDER — LAMOTRIGINE 150 MG PO TABS: 150.0000 mg | ORAL_TABLET | Freq: Two times a day (BID) | ORAL | 0 refills | Status: DC

## 2020-06-27 MED ORDER — BUPROPION HCL ER (XL) 300 MG PO TB24: 300.0000 mg | ORAL_TABLET | Freq: Every day | ORAL | 0 refills | Status: DC

## 2020-06-27 NOTE — Progress Notes (Signed)
Virtual Visit via Telephone Note  I connected with Tytus Strahle Crossman on 06/27/20 at  3:00 PM EDT by telephone and verified that I am speaking with the correct person using two identifiers.  Location: Patient: home Provider: home office   I discussed the limitations, risks, security and privacy concerns of performing an evaluation and management service by telephone and the availability of in person appointments. I also discussed with the patient that there may be a patient responsible charge related to this service. The patient expressed understanding and agreed to proceed.   History of Present Illness: Patient is evaluated by phone session.  He is doing well on his medication.  He is taking his medication as prescribed.  He is taking Abilify half tablet and sometimes he noticed that he is depressed but denies any crying spells, feeling of hopelessness or any suicidal thoughts.  He has recently blood work at Dr. Gerlean Ren office but results are not available.  However he was told his hemoglobin A1c is 5.7.  He reported no tremors or shakes or any EPS.  His attention, concentration is good.  He is able to finish his task on time.  His appetite is okay and his weight is unchanged from the past.  He denies any panic attack or any feelings of nervousness.  He does not get overwhelmed and he feels the medicine helping.  Past Psychiatric History: No h/oinpatient treatment or suicidal attempt. Tried Prozac for many years until it stopped working. We tried Cymbalta, Klonopin, BuSparand Lexapro but that did not help. History of ADD. No history of paranoia, hallucination or psychosis.  Psychiatric Specialty Exam: Physical Exam  Review of Systems  Weight 223 lb (101.2 kg).There is no height or weight on file to calculate BMI.  General Appearance: NA  Eye Contact:  NA  Speech:  Clear and Coherent and Slow  Volume:  Normal  Mood:  Dysphoric  Affect:  NA  Thought Process:  Goal Directed   Orientation:  Full (Time, Place, and Person)  Thought Content:  Logical  Suicidal Thoughts:  No  Homicidal Thoughts:  No  Memory:  Immediate;   Good Recent;   Good Remote;   Good  Judgement:  Intact  Insight:  Good  Psychomotor Activity:  NA  Concentration:  Concentration: Good and Attention Span: Good  Recall:  Good  Fund of Knowledge:  Good  Language:  Good  Akathisia:  No  Handed:  Right  AIMS (if indicated):     Assets:  Communication Skills Desire for Improvement Housing Resilience Social Support Talents/Skills Transportation  ADL's:  Intact  Cognition:  WNL  Sleep:   ok     Assessment and Plan: Major depressive disorder, recurrent.  Anxiety.  ADD, inattentive type.  Patient occasionally noticed symptoms of depression with fatigue but denies any crying spells or any suicidal thoughts.  He like to go back on Abilify 5 mg as he started taking half of the Abilify since the last visit.  I agree that he can go back to Abilify 5 mg however we will get the blood work results from his PCP.  He was told his hemoglobin A1c is normal.  Continue Lamictal 300 mg daily, Wellbutrin XL 300 mg daily and Ritalin 20 mg 4 times a day.  Patient denies any chest pain, tremors, palpitation or any insomnia.  Recommended to call us back if is any question or any concern.  Follow up in 3 months.  He like to have his prescription sent  to [facility] but Ritalin through his local pharmacy.  Follow Up Instructions:    I discussed the assessment and treatment plan with the patient. The patient was provided an opportunity to ask questions and all were answered. The patient agreed with the plan and demonstrated an understanding of the instructions.   The patient was advised to call back or seek an in-person evaluation if the symptoms worsen or if the condition fails to improve as anticipated.  I provided 17 minutes of non-face-to-face time during this encounter.   Kathlee Nations, MD

## 2020-06-28 ENCOUNTER — Telehealth (HOSPITAL_COMMUNITY): Payer: Self-pay | Admitting: *Deleted

## 2020-06-28 NOTE — Telephone Encounter (Signed)
Thanks

## 2020-06-28 NOTE — Telephone Encounter (Signed)
Labs from 519/21 received from Dr. Anda Latina @ [facility]. All WNL with the exception of HgBA1C @ 5.8, and HDLD @ 29, Chol/HDL ratio @ 5.0. results will be scanned to chart.

## 2020-07-14 ENCOUNTER — Telehealth (HOSPITAL_COMMUNITY): Payer: Self-pay | Admitting: *Deleted

## 2020-07-14 DIAGNOSIS — F900 Attention-deficit hyperactivity disorder, predominantly inattentive type: Secondary | ICD-10-CM

## 2020-07-14 NOTE — Telephone Encounter (Signed)
Pt called requesting refill of Ritalin. Pt has an upcoming appointment on 03/30/20.

## 2020-07-17 MED ORDER — METHYLPHENIDATE HCL 20 MG PO TABS: 20.0000 mg | ORAL_TABLET | Freq: Four times a day (QID) | ORAL | 0 refills | Status: DC

## 2020-07-17 NOTE — Telephone Encounter (Signed)
Done

## 2020-08-16 ENCOUNTER — Telehealth (HOSPITAL_COMMUNITY): Payer: Self-pay | Admitting: *Deleted

## 2020-08-16 DIAGNOSIS — F900 Attention-deficit hyperactivity disorder, predominantly inattentive type: Secondary | ICD-10-CM

## 2020-08-16 NOTE — Telephone Encounter (Signed)
Patient needs a refill of Ritalin sent to [facility].  His next appt is in July.

## 2020-08-17 MED ORDER — METHYLPHENIDATE HCL 20 MG PO TABS: 20.0000 mg | ORAL_TABLET | Freq: Four times a day (QID) | ORAL | 0 refills | Status: DC

## 2020-08-17 NOTE — Telephone Encounter (Signed)
Done

## 2020-09-02 ENCOUNTER — Other Ambulatory Visit (HOSPITAL_COMMUNITY): Payer: Self-pay | Admitting: Psychiatry

## 2020-09-02 DIAGNOSIS — F330 Major depressive disorder, recurrent, mild: Secondary | ICD-10-CM

## 2020-09-18 ENCOUNTER — Telehealth (HOSPITAL_COMMUNITY): Payer: Self-pay | Admitting: *Deleted

## 2020-09-18 DIAGNOSIS — F900 Attention-deficit hyperactivity disorder, predominantly inattentive type: Secondary | ICD-10-CM

## 2020-09-18 MED ORDER — METHYLPHENIDATE HCL 20 MG PO TABS: 20.0000 mg | ORAL_TABLET | Freq: Four times a day (QID) | ORAL | 0 refills | Status: DC

## 2020-09-18 NOTE — Telephone Encounter (Signed)
Pt of Dr. Adele Schilder called requesting refill of the Ritalin 20 mg qid. Last filled 08/19/20. Pt has an upcoming appointment on 09/27/20 with Dr. Adele Schilder. [facility] [place] [facility] in [facility] Snapshot. Thanks.

## 2020-09-27 ENCOUNTER — Telehealth (INDEPENDENT_AMBULATORY_CARE_PROVIDER_SITE_OTHER): Payer: 59 | Admitting: Psychiatry

## 2020-09-27 ENCOUNTER — Other Ambulatory Visit: Payer: Self-pay

## 2020-09-27 ENCOUNTER — Encounter (HOSPITAL_COMMUNITY): Payer: Self-pay | Admitting: Psychiatry

## 2020-09-27 DIAGNOSIS — F330 Major depressive disorder, recurrent, mild: Secondary | ICD-10-CM | POA: Diagnosis not present

## 2020-09-27 DIAGNOSIS — F411 Generalized anxiety disorder: Secondary | ICD-10-CM | POA: Diagnosis not present

## 2020-09-27 DIAGNOSIS — F900 Attention-deficit hyperactivity disorder, predominantly inattentive type: Secondary | ICD-10-CM

## 2020-09-27 MED ORDER — LAMOTRIGINE 150 MG PO TABS: 150.0000 mg | ORAL_TABLET | Freq: Two times a day (BID) | ORAL | 0 refills | Status: DC

## 2020-09-27 MED ORDER — ARIPIPRAZOLE 5 MG PO TABS: 5.0000 mg | ORAL_TABLET | Freq: Every day | ORAL | 0 refills | Status: DC

## 2020-09-27 MED ORDER — BUPROPION HCL ER (XL) 300 MG PO TB24: 300.0000 mg | ORAL_TABLET | Freq: Every day | ORAL | 0 refills | Status: DC

## 2020-09-27 MED ORDER — METHYLPHENIDATE HCL 20 MG PO TABS: 20.0000 mg | ORAL_TABLET | Freq: Four times a day (QID) | ORAL | 0 refills | Status: DC

## 2020-09-27 NOTE — Progress Notes (Signed)
Virtual Visit via Telephone Note  I connected with Dakota Vang on 09/27/20 at 10:20 AM EDT by telephone and verified that I am speaking with the correct person using two identifiers.  Location: Patient: Home Provider: Home Office   I discussed the limitations, risks, security and privacy concerns of performing an evaluation and management service by telephone and the availability of in person appointments. I also discussed with the patient that there may be a patient responsible charge related to this service. The patient expressed understanding and agreed to proceed.   History of Present Illness: Patient is evaluated by phone session.  He is back on Abilify 5 mg and he noticed improvement in his mood, depression and negative thoughts.  He admitted 5 pound weight gain because he is not exercising.  However he like to start exercising and recently his wife bought a rowing machine he wants to try that.  Overall he feels good.  His able to focus and multitasking.  He endorsed work is very busy but manageable and with the help of medication he can finish his task on time.  Denies any panic attack or any crying spells.  His energy level is good.  His appetite is okay.  He denies any anhedonia or any feeling of hopelessness.  His family life is good.  He like to keep the current medication since it is working well.  He has upcoming appointment with his PCP for the blood work.  He was told last time his blood sugar is slightly high and he is hoping next visit will be much better since he watching his calorie intake.   Past Psychiatric History:  No h/o inpatient treatment or suicidal attempt.  Tried Prozac for many years until it stopped working.  We tried Cymbalta, Klonopin, BuSpar and Lexapro but that did not help.  History of ADD.  No history of paranoia, hallucination or psychosis.  Psychiatric Specialty Exam: Physical Exam  Review of Systems  Weight 230 lb (104.3 kg).There is no height or weight  on file to calculate BMI.  General Appearance: NA  Eye Contact:  NA  Speech:  Clear and Coherent and Slow  Volume:  Normal  Mood:  Euthymic  Affect:  NA  Thought Process:  Goal Directed  Orientation:  Full (Time, Place, and Person)  Thought Content:  WDL  Suicidal Thoughts:  No  Homicidal Thoughts:  No  Memory:  Immediate;   Good Recent;   Good Remote;   Good  Judgement:  Good  Insight:  Good  Psychomotor Activity:  NA  Concentration:  Concentration: Good and Attention Span: Good  Recall:  Good  Fund of Knowledge:  Good  Language:  Good  Akathisia:  No  Handed:  Right  AIMS (if indicated):     Assets:  Communication Skills Desire for Improvement Housing Resilience Social Support Talents/Skills Transportation  ADL's:  Intact  Cognition:  WNL  Sleep:   ok      Assessment and Plan: Major depressive disorder, recurrent.  Anxiety.  ADD, inattentive type.  Patient doing better since he started taking Abilify 5 mg full tablet.  We talked about weight gain as patient not doing exercise.  Given the fact that he was told his blood sugar is slightly high I recommend he should do regular exercise.  He does not want to change the medication so that is working well.  We will continue Lamictal 300 mg daily, Wellbutrin XL 300 mg daily, Abilify 5 mg daily and  Ritalin 20 mg 4 times a day.  I reminded when he had a next blood work with his PCP then remind the PCP to send [place] a copy of the results.  I recommend to call us back with any question or any concern.  Follow-up in 3 months.  Follow Up Instructions:    I discussed the assessment and treatment plan with the patient. The patient was provided an opportunity to ask questions and all were answered. The patient agreed with the plan and demonstrated an understanding of the instructions.   The patient was advised to call back or seek an in-person evaluation if the symptoms worsen or if the condition fails to improve as anticipated.  I  provided 17 minutes of non-face-to-face time during this encounter.   Kathlee Nations, MD

## 2020-11-02 ENCOUNTER — Telehealth (HOSPITAL_COMMUNITY): Payer: Self-pay | Admitting: Psychiatry

## 2020-11-02 NOTE — Telephone Encounter (Signed)
Blood result from PCP office received.  On August 17 hemoglobin A1c 6.7 CBC normal.  Glucose 156 BUN 14 and creatinine 1.05 ALT 57.  PSA and TSH normal.  Cholesterol 143, triglyceride 245.

## 2020-11-17 ENCOUNTER — Telehealth (HOSPITAL_COMMUNITY): Payer: Self-pay | Admitting: *Deleted

## 2020-11-17 DIAGNOSIS — F900 Attention-deficit hyperactivity disorder, predominantly inattentive type: Secondary | ICD-10-CM

## 2020-11-17 MED ORDER — METHYLPHENIDATE HCL 20 MG PO TABS: 20.0000 mg | ORAL_TABLET | Freq: Four times a day (QID) | ORAL | 0 refills | Status: DC

## 2020-11-17 NOTE — Telephone Encounter (Signed)
Done

## 2020-11-17 NOTE — Telephone Encounter (Signed)
Pt called requesting a refill of the Ritalin 20 mg qid. Pt has an appointment upcoming on 12/28/20. Please review. Thanks.

## 2020-11-20 ENCOUNTER — Telehealth (HOSPITAL_COMMUNITY): Payer: Self-pay | Admitting: *Deleted

## 2020-11-20 NOTE — Telephone Encounter (Signed)
Pt called requesting a return t/c from you to discuss some med changes his PCP wants to make. Pt next scheduled appointment 01/28/21. Thanks.

## 2020-11-20 NOTE — Telephone Encounter (Signed)
Spoke to the patient.  He is concerned about high blood sugar and last labs shows PCP 6.7 which is increased from 5.7 in November.  His PCP started him on metformin 500 mg twice a day.  He is trying to lose weight.  I recommend to take the Abilify half tablet only as he trying to lose weight and hoping the metformin helped his blood sugar.  We talk about if that did not work then we will consider switching to a different medication.  Other options are Latuda, REXULTI and Vraylar.  Patient understand and he will give [place] a call and we will discuss more on his next appointment.

## 2020-11-21 ENCOUNTER — Other Ambulatory Visit (HOSPITAL_COMMUNITY): Payer: Self-pay | Admitting: Psychiatry

## 2020-11-21 DIAGNOSIS — F411 Generalized anxiety disorder: Secondary | ICD-10-CM

## 2020-11-21 DIAGNOSIS — F330 Major depressive disorder, recurrent, mild: Secondary | ICD-10-CM

## 2020-12-15 ENCOUNTER — Telehealth (HOSPITAL_COMMUNITY): Payer: Self-pay | Admitting: *Deleted

## 2020-12-15 DIAGNOSIS — F900 Attention-deficit hyperactivity disorder, predominantly inattentive type: Secondary | ICD-10-CM

## 2020-12-15 MED ORDER — METHYLPHENIDATE HCL 20 MG PO TABS: 20.0000 mg | ORAL_TABLET | Freq: Four times a day (QID) | ORAL | 0 refills | Status: DC

## 2020-12-15 NOTE — Addendum Note (Signed)
Addended by: Berniece Andreas T on: 12/15/2020 11:28 AM   Modules accepted: Orders

## 2020-12-15 NOTE — Telephone Encounter (Signed)
Pt called requesting a refill of the Ritalin 20 mg qid. Pt has an upcoming appointment on 12/28/20. Thanks.

## 2020-12-15 NOTE — Telephone Encounter (Signed)
Done

## 2020-12-28 ENCOUNTER — Encounter (HOSPITAL_COMMUNITY): Payer: Self-pay | Admitting: Psychiatry

## 2020-12-28 ENCOUNTER — Telehealth (HOSPITAL_BASED_OUTPATIENT_CLINIC_OR_DEPARTMENT_OTHER): Payer: 59 | Admitting: Psychiatry

## 2020-12-28 ENCOUNTER — Other Ambulatory Visit: Payer: Self-pay

## 2020-12-28 DIAGNOSIS — F411 Generalized anxiety disorder: Secondary | ICD-10-CM | POA: Diagnosis not present

## 2020-12-28 DIAGNOSIS — F330 Major depressive disorder, recurrent, mild: Secondary | ICD-10-CM | POA: Diagnosis not present

## 2020-12-28 DIAGNOSIS — F900 Attention-deficit hyperactivity disorder, predominantly inattentive type: Secondary | ICD-10-CM | POA: Diagnosis not present

## 2020-12-28 MED ORDER — METHYLPHENIDATE HCL 20 MG PO TABS: 20.0000 mg | ORAL_TABLET | Freq: Four times a day (QID) | ORAL | 0 refills | Status: DC

## 2020-12-28 MED ORDER — LAMOTRIGINE 150 MG PO TABS: 150.0000 mg | ORAL_TABLET | Freq: Two times a day (BID) | ORAL | 0 refills | Status: DC

## 2020-12-28 MED ORDER — ARIPIPRAZOLE 5 MG PO TABS: 2.5000 mg | ORAL_TABLET | Freq: Every day | ORAL | 0 refills | Status: DC

## 2020-12-28 MED ORDER — BUPROPION HCL ER (XL) 300 MG PO TB24: 300.0000 mg | ORAL_TABLET | Freq: Every day | ORAL | 0 refills | Status: DC

## 2020-12-28 NOTE — Progress Notes (Signed)
Virtual Visit via Telephone Note  I connected with Dakota Vang on 12/28/20 at 10:00 AM EDT by telephone and verified that I am speaking with the correct person using two identifiers.  Location: Patient: Home Provider: Home Office   I discussed the limitations, risks, security and privacy concerns of performing an evaluation and management service by telephone and the availability of in person appointments. I also discussed with the patient that there may be a patient responsible charge related to this service. The patient expressed understanding and agreed to proceed.   History of Present Illness: Patient is evaluated by phone session.  He is taking Abilify 2.5 mg which was reduced after we received the blood work and find out that his hemoglobin A1c is 6.7.  He has a blood work on August 17 at his PCP Dr. Gerlean Ren office.  He has upcoming appointment for follow-up.  Since started the metformin he noticed he has lost few pounds but has not checked blood sugar regularly.  He feels sometimes some irritability and frustration since the Abilify dose reduced and like to go up back to 5 mg but realized that it can cause high blood sugar.  He admitted increased anxiety lately because his son's best friend is now living with them after his parents separated and getting divorced.  Patient is not sure how long he will stay with them.  He also stated increased financial distress because his multiple teeth require chronic and there is a huge expense to fix that.  He denies any crying spells, feeling of hopelessness or worthlessness.  He denies any suicidal thoughts.  He reported his job is going well with the help of medication and he is able to focus, attention and complete his assignment.  Even though job is very busy but manageable.  He sleeps very good.  He denies any mania, psychosis, anger.  He has no tremor or shakes or any EPS.   Past Psychiatric History:  No h/o inpatient treatment or suicidal  attempt.  Tried Prozac for many years until it stopped working.  We tried Cymbalta, Klonopin, BuSpar and Lexapro but that did not help.  History of ADD.  No history of paranoia, hallucination or psychosis.  Psychiatric Specialty Exam: Physical Exam  Review of Systems  Weight 230 lb (104.3 kg).Body mass index is 30.34 kg/m.  General Appearance: NA  Eye Contact:  NA  Speech:  Slow  Volume:  Normal  Mood:  Anxious  Affect:  NA  Thought Process:  Goal Directed  Orientation:  Full (Time, Place, and Person)  Thought Content:  WDL  Suicidal Thoughts:  No  Homicidal Thoughts:  No  Memory:  Immediate;   Good Recent;   Good Remote;   Good  Judgement:  Intact  Insight:  Present  Psychomotor Activity:  NA  Concentration:  Concentration: Good and Attention Span: Good  Recall:  Good  Fund of Knowledge:  Good  Language:  Good  Akathisia:  No  Handed:  Right  AIMS (if indicated):     Assets:  Communication Skills Desire for Improvement Housing Resilience Social Support Talents/Skills Transportation  ADL's:  Intact  Cognition:  WNL  Sleep:   good      Assessment and Plan: Major depressive disorder, recurrent.  Anxiety.  ADD, inattentive type.  Patient is now taking Abilify 2.5 mg due to high hemoglobin A1c.  He is feeling okay but like to go back to 5 mg.  I recommend he should wait for the  blood work results as he has upcoming scheduled appointment with PCP.  I encouraged to watch his calorie intake and exercise.  Discussed current anxiety related to finances and living situation.  Recommended to do a breathing exercise to calm himself.  If his blood sugar improved we may consider going back on Abilify 5 mg.  However if blood sugar remains high then we may need to switch to a different medication.  For now continue Abilify 2.5 mg daily, Lamictal 300 mg daily, Wellbutrin XL 300 mg daily and Ritalin 20 mg 4 times a day.  Recommended to call us back with any question or any concern.   Follow-up in 3 months.    Follow Up Instructions:    I discussed the assessment and treatment plan with the patient. The patient was provided an opportunity to ask questions and all were answered. The patient agreed with the plan and demonstrated an understanding of the instructions.   The patient was advised to call back or seek an in-person evaluation if the symptoms worsen or if the condition fails to improve as anticipated.  I provided 22 minutes of non-face-to-face time during this encounter.   Kathlee Nations, MD

## 2021-02-12 ENCOUNTER — Telehealth (HOSPITAL_COMMUNITY): Payer: Self-pay | Admitting: *Deleted

## 2021-02-12 DIAGNOSIS — F900 Attention-deficit hyperactivity disorder, predominantly inattentive type: Secondary | ICD-10-CM

## 2021-02-12 MED ORDER — METHYLPHENIDATE HCL 20 MG PO TABS: 20.0000 mg | ORAL_TABLET | Freq: Four times a day (QID) | ORAL | 0 refills | Status: DC

## 2021-02-12 NOTE — Telephone Encounter (Signed)
I send refill to his pharmacy.

## 2021-02-12 NOTE — Telephone Encounter (Signed)
Pt called requesting early refill of the Ritalin 20 mg qid. Rx last filled 01/18/21 but pt says he is going out of town on 02/15/21 and is asking for you to ok this. Pt ha san appointment upcoming on 03/18/21. Please review.

## 2021-02-22 ENCOUNTER — Other Ambulatory Visit (HOSPITAL_COMMUNITY): Payer: Self-pay | Admitting: Psychiatry

## 2021-02-22 DIAGNOSIS — F330 Major depressive disorder, recurrent, mild: Secondary | ICD-10-CM

## 2021-02-22 DIAGNOSIS — F411 Generalized anxiety disorder: Secondary | ICD-10-CM

## 2021-03-05 ENCOUNTER — Other Ambulatory Visit (HOSPITAL_COMMUNITY): Payer: Self-pay | Admitting: Psychiatry

## 2021-03-05 DIAGNOSIS — F330 Major depressive disorder, recurrent, mild: Secondary | ICD-10-CM

## 2021-03-16 ENCOUNTER — Telehealth (HOSPITAL_COMMUNITY): Payer: Self-pay

## 2021-03-16 DIAGNOSIS — F900 Attention-deficit hyperactivity disorder, predominantly inattentive type: Secondary | ICD-10-CM

## 2021-03-16 MED ORDER — METHYLPHENIDATE HCL 20 MG PO TABS: 20.0000 mg | ORAL_TABLET | Freq: Four times a day (QID) | ORAL | 0 refills | Status: DC

## 2021-03-16 NOTE — Telephone Encounter (Signed)
Done

## 2021-03-16 NOTE — Telephone Encounter (Signed)
INFORMED PATIENT

## 2021-03-16 NOTE — Telephone Encounter (Signed)
Patient called requesting a refill on his Methylphenidate 20mg  [facility] [place]. Please review and advise. Thank you

## 2021-03-28 ENCOUNTER — Telehealth (HOSPITAL_BASED_OUTPATIENT_CLINIC_OR_DEPARTMENT_OTHER): Payer: 59 | Admitting: Psychiatry

## 2021-03-28 ENCOUNTER — Other Ambulatory Visit: Payer: Self-pay

## 2021-03-28 ENCOUNTER — Encounter (HOSPITAL_COMMUNITY): Payer: Self-pay | Admitting: Psychiatry

## 2021-03-28 DIAGNOSIS — F330 Major depressive disorder, recurrent, mild: Secondary | ICD-10-CM

## 2021-03-28 DIAGNOSIS — F900 Attention-deficit hyperactivity disorder, predominantly inattentive type: Secondary | ICD-10-CM | POA: Diagnosis not present

## 2021-03-28 DIAGNOSIS — F411 Generalized anxiety disorder: Secondary | ICD-10-CM | POA: Diagnosis not present

## 2021-03-28 MED ORDER — LAMOTRIGINE 150 MG PO TABS: 150.0000 mg | ORAL_TABLET | Freq: Two times a day (BID) | ORAL | 0 refills | Status: DC

## 2021-03-28 MED ORDER — BUPROPION HCL ER (XL) 300 MG PO TB24: 300.0000 mg | ORAL_TABLET | Freq: Every day | ORAL | 0 refills | Status: DC

## 2021-03-28 MED ORDER — METHYLPHENIDATE HCL 20 MG PO TABS: 20.0000 mg | ORAL_TABLET | Freq: Four times a day (QID) | ORAL | 0 refills | Status: DC

## 2021-03-28 MED ORDER — ARIPIPRAZOLE 5 MG PO TABS: 2.5000 mg | ORAL_TABLET | Freq: Every day | ORAL | 0 refills | Status: DC

## 2021-03-28 NOTE — Progress Notes (Signed)
Virtual Visit via Telephone Note  I connected with Dhyan Noah Dombrosky on 03/28/21 at 10:20 AM EST by telephone and verified that I am speaking with the correct person using two identifiers.  Location: Patient: Home Provider: Home Office   I discussed the limitations, risks, security and privacy concerns of performing an evaluation and management service by telephone and the availability of in person appointments. I also discussed with the patient that there may be a patient responsible charge related to this service. The patient expressed understanding and agreed to proceed.   History of Present Illness: Patient is evaluated by phone session.  He had a good Christmas.  He is taking all his medication which is helping his mood, anxiety, depression and his attention and focus.  His job is very busy and overwhelming but he is pleased that he is able to finish his work on time.  He does not get distracted.  He sleeps good.  He has no tremors, shakes or any EPS.  He has appointment with Dr. Milinda Hirschfeld on February 15 for blood work.  His weight remained unchanged from the past.  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  However he is hoping since we have cut down his Abilify his blood sugar will improve.  He denies any crying spells or any feeling of hopelessness or worthlessness.  He started doing rowing machine at home.  His son's friend is now living with them and he is thinking about getting adoption.  Patient told he is 56 year old and like to start school.  Patient is also sad about upcoming trip to cruise this month.  Patient like to keep his current medication.  Past Psychiatric History:  No h/o inpatient treatment or suicidal attempt.  Tried Prozac for many years until it stopped working.  We tried Cymbalta, Klonopin, BuSpar and Lexapro but that did not help.  History of ADD.  No history of paranoia, hallucination  or psychosis.  Psychiatric Specialty Exam: Physical Exam  Review of Systems  Weight 230 lb (104.3 kg).There is no height or weight on file to calculate BMI.  General Appearance: NA  Eye Contact:  NA  Speech:  Clear and Coherent and Normal Rate  Volume:  Normal  Mood:  Euthymic  Affect:  NA  Thought Process:  Goal Directed  Orientation:  Full (Time, Place, and Person)  Thought Content:  WDL and Logical  Suicidal Thoughts:  No  Homicidal Thoughts:  No  Memory:  Immediate;   Good Recent;   Good Remote;   Good  Judgement:  Good  Insight:  Present  Psychomotor Activity:  NA  Concentration:  Concentration: Good and Attention Span: Good  Recall:  Good  Fund of Knowledge:  Good  Language:  Good  Akathisia:  No  Handed:  Right  AIMS (if indicated):     Assets:  Communication Skills Desire for Improvement Housing Resilience Social Support Transportation  ADL's:  Intact  Cognition:  WNL  Sleep:   ok      Assessment and Plan: Major depressive disorder, recurrent.  Anxiety.  ADD, inattentive type.  Patient is stable on his current medication.  Encouraged to keep appointment with his PCP for blood work.  Recommended to have his blood work results faxed to [place].  His last hemoglobin A1c was about 6.  We have cut down his Abilify and is hoping a better result this time.  Encouraged to continue exercise and watch his calorie intake.  Patient does not want to change the medication.  He has no rash or any itching.  Continue Lamictal 300 mg daily, Wellbutrin XL 300 mg daily, Abilify 2.5 mg daily and Ritalin 20 mg 4 times a day.  Patient excited about upcoming cruise trip.  Recommended to call us back if is any question or any concern.  Follow-up with 3 months.  Follow Up Instructions:    I discussed the assessment and treatment plan with the patient. The patient was provided an opportunity to ask questions and all were answered. The patient agreed with the plan and demonstrated an  understanding of the instructions.   The patient was advised to call back or seek an in-person evaluation if the symptoms worsen or if the condition fails to improve as anticipated.  I provided 20 minutes of non-face-to-face time during this encounter.   Kathlee Nations, MD

## 2021-04-27 ENCOUNTER — Telehealth (HOSPITAL_COMMUNITY): Payer: Self-pay | Admitting: *Deleted

## 2021-04-27 NOTE — Telephone Encounter (Signed)
Lab results from 04/25/21 received from Dr. Lysle Rubens @ [facility]. Labs comprised of CBC w/Diff, HgbA1c, TSH, and Lipid Panel. All WNL with the exception og the HgbA1c @6 .9, and glucose of 156. Will send to be scanned to chart.

## 2021-05-11 ENCOUNTER — Telehealth (HOSPITAL_COMMUNITY): Payer: Self-pay | Admitting: *Deleted

## 2021-05-11 DIAGNOSIS — F900 Attention-deficit hyperactivity disorder, predominantly inattentive type: Secondary | ICD-10-CM

## 2021-05-11 MED ORDER — METHYLPHENIDATE HCL 20 MG PO TABS: 20.0000 mg | ORAL_TABLET | Freq: Four times a day (QID) | ORAL | 0 refills | Status: DC

## 2021-05-11 NOTE — Telephone Encounter (Signed)
Pt called requesting a refill of the [facility]ital[facility] [place]. Last filled 04/18/21. Pt has an upcoming appointment on 06/27/21. Please review. Thanks.  ?

## 2021-05-11 NOTE — Telephone Encounter (Signed)
Sent. He can pick up on his due date.  ?

## 2021-05-22 ENCOUNTER — Other Ambulatory Visit (HOSPITAL_COMMUNITY): Payer: Self-pay | Admitting: Psychiatry

## 2021-05-22 DIAGNOSIS — F330 Major depressive disorder, recurrent, mild: Secondary | ICD-10-CM

## 2021-05-22 DIAGNOSIS — F411 Generalized anxiety disorder: Secondary | ICD-10-CM

## 2021-06-13 ENCOUNTER — Telehealth (HOSPITAL_COMMUNITY): Payer: Self-pay | Admitting: *Deleted

## 2021-06-13 DIAGNOSIS — F900 Attention-deficit hyperactivity disorder, predominantly inattentive type: Secondary | ICD-10-CM

## 2021-06-13 MED ORDER — METHYLPHENIDATE HCL 20 MG PO TABS: 20.0000 mg | ORAL_TABLET | Freq: Four times a day (QID) | ORAL | 0 refills | Status: DC

## 2021-06-13 NOTE — Telephone Encounter (Signed)
Pt called requesting early refill of the Ritalin 20 mg qid. Pt says he's going out of town Friday 06/15/21 for the holiday weekend and will run out of meds. [facility] [place] pt last filled script on 05/16/21 so would need new script. Please review.  ?

## 2021-06-13 NOTE — Telephone Encounter (Signed)
I sent his prescription.  It is up to his insurance if they allow him to refill his prescription few days early. ?

## 2021-06-27 ENCOUNTER — Telehealth (HOSPITAL_BASED_OUTPATIENT_CLINIC_OR_DEPARTMENT_OTHER): Payer: 59 | Admitting: Psychiatry

## 2021-06-27 ENCOUNTER — Encounter (HOSPITAL_COMMUNITY): Payer: Self-pay | Admitting: Psychiatry

## 2021-06-27 ENCOUNTER — Other Ambulatory Visit (HOSPITAL_COMMUNITY): Payer: Self-pay | Admitting: *Deleted

## 2021-06-27 VITALS — Wt 230.0 lb

## 2021-06-27 DIAGNOSIS — F411 Generalized anxiety disorder: Secondary | ICD-10-CM

## 2021-06-27 DIAGNOSIS — F900 Attention-deficit hyperactivity disorder, predominantly inattentive type: Secondary | ICD-10-CM | POA: Diagnosis not present

## 2021-06-27 DIAGNOSIS — F330 Major depressive disorder, recurrent, mild: Secondary | ICD-10-CM

## 2021-06-27 MED ORDER — BUPROPION HCL ER (XL) 300 MG PO TB24: 300.0000 mg | ORAL_TABLET | Freq: Every day | ORAL | 0 refills | Status: DC

## 2021-06-27 MED ORDER — REXULTI 0.5 MG PO TABS: 0.5000 mg | ORAL_TABLET | Freq: Every day | ORAL | 0 refills | Status: DC

## 2021-06-27 MED ORDER — LAMOTRIGINE 150 MG PO TABS: 150.0000 mg | ORAL_TABLET | Freq: Two times a day (BID) | ORAL | 0 refills | Status: DC

## 2021-06-27 MED ORDER — METHYLPHENIDATE HCL 20 MG PO TABS: 20.0000 mg | ORAL_TABLET | Freq: Four times a day (QID) | ORAL | 0 refills | Status: DC

## 2021-06-27 NOTE — Progress Notes (Signed)
Virtual Visit via Telephone Note ? ?I connected with Dakota Vang on 06/27/21 at 10:00 AM EDT by telephone and verified that I am speaking with the correct person using two identifiers. ? ?Location: ?Patient: Home ?Provider: Home Office ?  ?I discussed the limitations, risks, security and privacy concerns of performing an evaluation and management service by telephone and the availability of in person appointments. I also discussed with the patient that there may be a patient responsible charge related to this service. The patient expressed understanding and agreed to proceed. ? ? ?History of Present Illness: ?Patient is evaluated by phone session.  He is taking all his medication as prescribed.  He is happy his son bought a land and started his own business.  He had a visit with his PCP in February and his hemoglobin A1c is 6.9.  Which is increased from 6.7.  He continues to do rowing to keep himself active and he also started cutting down his pasta and pizza.  His other labs are normal.  He is not able to lose weight and he is frustrated.  Though he feels Abilify had helped his depression a lot but now wondering if he can try something else.  He denies any crying spells or any feeling of hopelessness or worthlessness.  He denies any mania or psychosis or any hallucination.  He has no tremor or shakes or any EPS.  His job is going well and he is managing his responsibilities.  He denies any issues with focus, attention, multitasking.  He has no tremor or shakes or any EPS.  He sleeps good. ? ?Past Psychiatric History:  ?No h/o inpatient treatment or suicidal attempt.  Tried Prozac for many years until it stopped working.  We tried Cymbalta, Klonopin, BuSpar and Lexapro but that did not help.  History of ADD.  No history of paranoia, hallucination or psychosis. ?  ?Psychiatric Specialty Exam: ?Physical Exam  ?Review of Systems  ?Weight 230 lb (104.3 kg).There is no height or weight on file to calculate BMI.   ?General Appearance: NA  ?Eye Contact:  NA  ?Speech:  Clear and Coherent and Normal Rate  ?Volume:  Normal  ?Mood:  Euthymic  ?Affect:  NA  ?Thought Process:  Goal Directed  ?Orientation:  Full (Time, Place, and Person)  ?Thought Content:  Logical  ?Suicidal Thoughts:  No  ?Homicidal Thoughts:  No  ?Memory:  Immediate;   Good ?Recent;   Good ?Remote;   Good  ?Judgement:  Intact  ?Insight:  Present  ?Psychomotor Activity:  Normal  ?Concentration:  Concentration: Good and Attention Span: Good  ?Recall:  Good  ?Fund of Knowledge:  Good  ?Language:  Good  ?Akathisia:  No  ?Handed:  Right  ?AIMS (if indicated):     ?Assets:  Communication Skills ?Desire for Improvement ?Housing ?Social Support ?Talents/Skills ?Transportation  ?ADL's:  Intact  ?Cognition:  WNL  ?Sleep:   ok  ? ? ? ? ?Assessment and Plan: ?Major depressive disorder, recurrent with anxiety.  ADD, inattentive type. ? ?I reviewed blood work results.  His hemoglobin A1c is 6.9 which was done in March.  Earlier he was 6.7 and few years ago before the Abilify he was 5.7.  I talked to him in detail about trying to come off from the Abilify to see if he can do without it.  However he is a reluctant if his symptoms come back.  I recommend try REXULTI 0.5 mg if stopping Abilify bring her symptoms back.  We will provide samples.  We will continue Lamictal 300 mg daily, Wellbutrin XL 300 mg daily and Ritalin 20 mg 4 times a day.  I encouraged to monitor closely his glucose and weight.  We talk about choosing healthy diet and stop eating pasta and pizza.  Recommended to call us back if is any question or any concern.  If he requires REXULTI prescription in the future then he will call us back.  We will follow up in 3 months. ? ?Follow Up Instructions: ? ?  ?I discussed the assessment and treatment plan with the patient. The patient was provided an opportunity to ask questions and all were answered. The patient agreed with the plan and demonstrated an understanding of  the instructions. ?  ?The patient was advised to call back or seek an in-person evaluation if the symptoms worsen or if the condition fails to improve as anticipated. ? ?Collaboration of Care: Primary Care Provider AEB notes are in epic to review.  ? ?Patient/Guardian was advised Release of Information must be obtained prior to any record release in order to collaborate their care with an outside provider. Patient/Guardian was advised if they have not already done so to contact the registration department to sign all necessary forms in order for [place] to release information regarding their care.  ? ?Consent: Patient/Guardian gives verbal consent for treatment and assignment of benefits for services provided during this visit. Patient/Guardian expressed understanding and agreed to proceed.   ? ?I provided 25 minutes of non-face-to-face time during this encounter. ? ? ?Kathlee Nations, MD  ?

## 2021-07-25 ENCOUNTER — Telehealth (HOSPITAL_COMMUNITY): Payer: Self-pay | Admitting: *Deleted

## 2021-07-25 NOTE — Telephone Encounter (Signed)
Pt called asking for a new script for Rexulti 1 mg. We gave him samples of the 0.5 mg on 06/27/21. Ok to send in script for 1 mg? Or for 0.5 mg? He sees you again on 09/27/21.  ?

## 2021-07-26 ENCOUNTER — Other Ambulatory Visit (HOSPITAL_COMMUNITY): Payer: Self-pay | Admitting: *Deleted

## 2021-07-26 MED ORDER — BREXPIPRAZOLE 1 MG PO TABS: 1.0000 mg | ORAL_TABLET | Freq: Every day | ORAL | 2 refills | Status: DC

## 2021-07-26 NOTE — Telephone Encounter (Signed)
Please check with the patient what strength of REXULTI he is taking and call to his local pharmacy.

## 2021-07-26 NOTE — Telephone Encounter (Signed)
done

## 2021-07-27 ENCOUNTER — Telehealth (HOSPITAL_COMMUNITY): Payer: Self-pay | Admitting: *Deleted

## 2021-07-27 NOTE — Telephone Encounter (Signed)
PA FOR REXULTI 1 MG APPROVED VIA COVER MY MEDS THRU 07/28/22.   PA CASE ID# [id number].

## 2021-08-10 ENCOUNTER — Telehealth (HOSPITAL_COMMUNITY): Payer: Self-pay | Admitting: *Deleted

## 2021-08-10 ENCOUNTER — Other Ambulatory Visit (HOSPITAL_COMMUNITY): Payer: Self-pay | Admitting: Psychiatry

## 2021-08-10 DIAGNOSIS — F900 Attention-deficit hyperactivity disorder, predominantly inattentive type: Secondary | ICD-10-CM

## 2021-08-10 MED ORDER — METHYLPHENIDATE HCL 20 MG PO TABS: 20.0000 mg | ORAL_TABLET | Freq: Four times a day (QID) | ORAL | 0 refills | Status: DC

## 2021-08-10 NOTE — Telephone Encounter (Signed)
sent 

## 2021-08-10 NOTE — Telephone Encounter (Signed)
Dr. Adele Schilder pt requesting early refill of the Ritalin 20 mg qd. Last fill on 07/13/21. Pt going out of town for a week so is asking for fill a few days early. Pt has an appointment upcoming on 09/27/21. Pt is compliant with medications and does not make a habit of asking for early refills. Please review.

## 2021-08-10 NOTE — Telephone Encounter (Signed)
Thank you :)

## 2021-08-26 ENCOUNTER — Other Ambulatory Visit (HOSPITAL_COMMUNITY): Payer: Self-pay | Admitting: Psychiatry

## 2021-08-26 DIAGNOSIS — F411 Generalized anxiety disorder: Secondary | ICD-10-CM

## 2021-08-26 DIAGNOSIS — F330 Major depressive disorder, recurrent, mild: Secondary | ICD-10-CM

## 2021-09-02 ENCOUNTER — Other Ambulatory Visit (HOSPITAL_COMMUNITY): Payer: Self-pay | Admitting: Psychiatry

## 2021-09-02 DIAGNOSIS — F330 Major depressive disorder, recurrent, mild: Secondary | ICD-10-CM

## 2021-09-05 ENCOUNTER — Telehealth (HOSPITAL_COMMUNITY): Payer: Self-pay | Admitting: *Deleted

## 2021-09-05 DIAGNOSIS — F900 Attention-deficit hyperactivity disorder, predominantly inattentive type: Secondary | ICD-10-CM

## 2021-09-05 NOTE — Telephone Encounter (Signed)
Will advise pt 

## 2021-09-05 NOTE — Telephone Encounter (Signed)
Pt called requesting an early refill of the 20 mg Ritalin (by 3 days) as he and family going out of town on this Friday  the 62 th. Pt last fill was on 08/10/21 which was also an early fill as he was going out of town. Pt has an appointment scheduled for 09/27/21. [facility] [place],

## 2021-09-05 NOTE — Telephone Encounter (Signed)
Cannot have multiple times early refills of stimulants.

## 2021-09-07 MED ORDER — METHYLPHENIDATE HCL 20 MG PO TABS: 20.0000 mg | ORAL_TABLET | Freq: Four times a day (QID) | ORAL | 0 refills | Status: DC

## 2021-09-07 NOTE — Telephone Encounter (Signed)
Prescription sent to pick up on his due date.

## 2021-09-07 NOTE — Telephone Encounter (Signed)
Medication management - Called patient back, after he left another message requesting an early refill of his methylphenidate 20 mg.  Informed patient he had picked it up early on 08/10/21 that should have been filled on 08/13/21 and Dr. Adele Schilder could not continue to do earlier refills each month with patient going out of town.  Patient agreed to return from [place] next week to pick up the medication and requested the new order be sent in for him to pick up when due on 09/12/21.  Agreed to request Dr. Adele Schilder send in a new order to fill on or after this date [facility] [place]  Patient stated understanding why the medication could not continue to be filled early each month.

## 2021-09-07 NOTE — Telephone Encounter (Signed)
Medication mangement - Called patient back to inform Dr. Adele Schilder had sent in his new Methylphenidate 20 mg [facility] [place] for patient to fill on 09/12/21.  Patient wanted Dr. Adele Schilder know he has enough through 09/11/21 and would return from being out of town in [place] on 09/12/21 to pick up his new order.  Patient wanted Dr. Adele Schilder to know he is taking them as prescribed, one a day and agreed to inform Dr. Adele Schilder of patient's report.

## 2021-09-27 ENCOUNTER — Telehealth (HOSPITAL_BASED_OUTPATIENT_CLINIC_OR_DEPARTMENT_OTHER): Payer: 59 | Admitting: Psychiatry

## 2021-09-27 ENCOUNTER — Encounter (HOSPITAL_COMMUNITY): Payer: Self-pay | Admitting: Psychiatry

## 2021-09-27 VITALS — Wt 234.0 lb

## 2021-09-27 DIAGNOSIS — F330 Major depressive disorder, recurrent, mild: Secondary | ICD-10-CM

## 2021-09-27 DIAGNOSIS — F411 Generalized anxiety disorder: Secondary | ICD-10-CM

## 2021-09-27 DIAGNOSIS — F900 Attention-deficit hyperactivity disorder, predominantly inattentive type: Secondary | ICD-10-CM | POA: Diagnosis not present

## 2021-09-27 MED ORDER — METHYLPHENIDATE HCL 20 MG PO TABS: 20.0000 mg | ORAL_TABLET | Freq: Four times a day (QID) | ORAL | 0 refills | Status: DC

## 2021-09-27 MED ORDER — BUPROPION HCL ER (XL) 300 MG PO TB24: 300.0000 mg | ORAL_TABLET | Freq: Every day | ORAL | 0 refills | Status: DC

## 2021-09-27 MED ORDER — BREXPIPRAZOLE 2 MG PO TABS: 2.0000 mg | ORAL_TABLET | Freq: Every day | ORAL | 0 refills | Status: DC

## 2021-09-27 MED ORDER — LAMOTRIGINE 150 MG PO TABS: 150.0000 mg | ORAL_TABLET | Freq: Two times a day (BID) | ORAL | 0 refills | Status: DC

## 2021-09-27 NOTE — Progress Notes (Signed)
Virtual Visit via Telephone Note  I connected with Montrez Marietta Diniz on 09/27/21 at 10:00 AM EDT by telephone and verified that I am speaking with the correct person using two identifiers.  Location: Patient: Home Provider: Home Office   I discussed the limitations, risks, security and privacy concerns of performing an evaluation and management service by telephone and the availability of in person appointments. I also discussed with the patient that there may be a patient responsible charge related to this service. The patient expressed understanding and agreed to proceed.   History of Present Illness: Patient is evaluated by phone session.  He is doing very well on his current medication.  He actually like REXULTI as he noticed more productive and motivated and does not feel anxious depressed.  He is happy that he passed the exam and now he got raised.  He would like to try higher dose of REXULTI.  He is having a better control on his blood sugar and lately he started exercise and watching his calorie intake.  He is doing rowing machine that helps.  Denies any crying spells or any feeling of hopelessness.  His energy level is good.  He is excited about upcoming cruise trip to [place] in the first week of August.  He has no tremor or shakes or any EPS.  He has appointment coming up after cruise vacation up with his PCP.  His attention concentration is good.  He is able to do multitasking.    Past Psychiatric History:  No h/o inpatient treatment or suicidal attempt.  Tried Prozac for many years until it stopped working.  We tried Cymbalta, Klonopin, BuSpar and Lexapro but that did not help.  History of ADD.  No history of paranoia, hallucination or psychosis.  Psychiatric Specialty Exam: Physical Exam  Review of Systems  Weight 234 lb (106.1 kg).There is no height or weight on file to calculate BMI.  General Appearance: NA  Eye Contact:  NA  Speech:  Clear and Coherent and Normal Rate  Volume:   Normal  Mood:  Euthymic  Affect:  NA  Thought Process:  Goal Directed  Orientation:  Full (Time, Place, and Person)  Thought Content:  Logical  Suicidal Thoughts:  No  Homicidal Thoughts:  No  Memory:  Immediate;   Good Recent;   Good Remote;   Good  Judgement:  Good  Insight:  Present  Psychomotor Activity:  Normal  Concentration:  Concentration: Good and Attention Span: Good  Recall:  Good  Fund of Knowledge:  Good  Language:  Good  Akathisia:  No  Handed:  Right  AIMS (if indicated):     Assets:  Communication Skills Desire for Improvement Housing Resilience Social Support Talents/Skills Transportation  ADL's:  Intact  Cognition:  WNL  Sleep:   ok      Assessment and Plan: Major depressive disorder, recurrent.  Anxiety.  ADD, inattentive type.  Patient doing better on his current medication and he would like to try higher dose of REXULTI since he noticed much improvement but is still have some residual symptoms.  I recommend higher dose may cause tremors but he still wants to try.  We will try REXULTI 2 mg however if he started to have any side effects then he will call us back.  I will continue Wellbutrin XL 300 mg daily, Lamictal 300 mg daily and Ritalin 20 mg 4 times a day which he usually gets from his local pharmacy.  Recommended to call us  back if is any question or any concern.  Follow-up in 3 months.  Follow Up Instructions:    I discussed the assessment and treatment plan with the patient. The patient was provided an opportunity to ask questions and all were answered. The patient agreed with the plan and demonstrated an understanding of the instructions.   The patient was advised to call back or seek an in-person evaluation if the symptoms worsen or if the condition fails to improve as anticipated.  Collaboration of Care: Primary Care Provider AEB notes are available in epic to review.  Patient/Guardian was advised Release of Information must be obtained  prior to any record release in order to collaborate their care with an outside provider. Patient/Guardian was advised if they have not already done so to contact the registration department to sign all necessary forms in order for [place] to release information regarding their care.   Consent: Patient/Guardian gives verbal consent for treatment and assignment of benefits for services provided during this visit. Patient/Guardian expressed understanding and agreed to proceed.    I provided 21 minutes of non-face-to-face time during this encounter.   Kathlee Nations, MD

## 2021-11-14 ENCOUNTER — Telehealth (HOSPITAL_COMMUNITY): Payer: Self-pay | Admitting: *Deleted

## 2021-11-14 DIAGNOSIS — F900 Attention-deficit hyperactivity disorder, predominantly inattentive type: Secondary | ICD-10-CM

## 2021-11-14 MED ORDER — METHYLPHENIDATE HCL 20 MG PO TABS: 20.0000 mg | ORAL_TABLET | Freq: Four times a day (QID) | ORAL | 0 refills | Status: DC

## 2021-11-14 NOTE — Telephone Encounter (Signed)
Done

## 2021-11-14 NOTE — Telephone Encounter (Signed)
Pt called requesting a refill of the Ritalin 20 mg qid. [facility] [place]. Pt has an upcoming appointment on 12/25/21. Please review.

## 2021-11-22 ENCOUNTER — Other Ambulatory Visit (HOSPITAL_COMMUNITY): Payer: Self-pay | Admitting: Psychiatry

## 2021-11-22 DIAGNOSIS — F330 Major depressive disorder, recurrent, mild: Secondary | ICD-10-CM

## 2021-11-22 DIAGNOSIS — F411 Generalized anxiety disorder: Secondary | ICD-10-CM

## 2021-11-30 ENCOUNTER — Other Ambulatory Visit (HOSPITAL_COMMUNITY): Payer: Self-pay | Admitting: Psychiatry

## 2021-12-03 ENCOUNTER — Encounter: Payer: Self-pay | Admitting: Gastroenterology

## 2021-12-03 ENCOUNTER — Other Ambulatory Visit (HOSPITAL_COMMUNITY): Payer: Self-pay | Admitting: Psychiatry

## 2021-12-03 DIAGNOSIS — F330 Major depressive disorder, recurrent, mild: Secondary | ICD-10-CM

## 2021-12-10 ENCOUNTER — Telehealth (HOSPITAL_COMMUNITY): Payer: Self-pay | Admitting: *Deleted

## 2021-12-10 DIAGNOSIS — F900 Attention-deficit hyperactivity disorder, predominantly inattentive type: Secondary | ICD-10-CM

## 2021-12-10 MED ORDER — METHYLPHENIDATE HCL 20 MG PO TABS: 20.0000 mg | ORAL_TABLET | Freq: Four times a day (QID) | ORAL | 0 refills | Status: DC

## 2021-12-10 NOTE — Telephone Encounter (Signed)
Done

## 2021-12-10 NOTE — Telephone Encounter (Signed)
Pt called requesting a refill of the Ritalin 20 mg qid. Last e scribed on 11/14/21. Pt states that he's going out of town Friday and is requesting script be sent for Thursday, 12/13/21. Please review.

## 2021-12-12 ENCOUNTER — Ambulatory Visit (AMBULATORY_SURGERY_CENTER): Payer: Self-pay

## 2021-12-12 VITALS — Ht 73.0 in | Wt 220.8 lb

## 2021-12-12 DIAGNOSIS — Z86010 Personal history of colonic polyps: Secondary | ICD-10-CM

## 2021-12-12 DIAGNOSIS — Z800 Family history of malignant neoplasm of digestive organs: Secondary | ICD-10-CM

## 2021-12-12 MED ORDER — NA SULFATE-K SULFATE-MG SULF 17.5-3.13-1.6 GM/177ML PO SOLN: 1.0000 | Freq: Once | ORAL | 0 refills | Status: AC

## 2021-12-12 NOTE — Progress Notes (Signed)
No egg or soy allergy known to patient  No issues known to pt with past sedation with any surgeries or procedures Patient denies ever being told they had issues or difficulty with intubation  No FH of Malignant Hyperthermia Pt is not on diet pills Pt is not on  home 02  Pt is not on blood thinners  Pt denies issues with constipation  No A fib or A flutter Have any cardiac testing pending--no Pt instructed to use [email or web address] or [facility] for a price reduction on prep   

## 2021-12-25 ENCOUNTER — Encounter: Payer: Self-pay | Admitting: Gastroenterology

## 2021-12-25 ENCOUNTER — Encounter (HOSPITAL_COMMUNITY): Payer: Self-pay | Admitting: Psychiatry

## 2021-12-25 ENCOUNTER — Telehealth (HOSPITAL_BASED_OUTPATIENT_CLINIC_OR_DEPARTMENT_OTHER): Payer: 59 | Admitting: Psychiatry

## 2021-12-25 DIAGNOSIS — F330 Major depressive disorder, recurrent, mild: Secondary | ICD-10-CM

## 2021-12-25 DIAGNOSIS — F411 Generalized anxiety disorder: Secondary | ICD-10-CM | POA: Diagnosis not present

## 2021-12-25 DIAGNOSIS — F900 Attention-deficit hyperactivity disorder, predominantly inattentive type: Secondary | ICD-10-CM

## 2021-12-25 MED ORDER — METHYLPHENIDATE HCL 20 MG PO TABS: 20.0000 mg | ORAL_TABLET | Freq: Four times a day (QID) | ORAL | 0 refills | Status: DC

## 2021-12-25 MED ORDER — BUPROPION HCL ER (XL) 300 MG PO TB24: 300.0000 mg | ORAL_TABLET | Freq: Every day | ORAL | 0 refills | Status: DC

## 2021-12-25 MED ORDER — BREXPIPRAZOLE 2 MG PO TABS: 2.0000 mg | ORAL_TABLET | Freq: Every day | ORAL | 0 refills | Status: DC

## 2021-12-25 MED ORDER — LAMOTRIGINE 150 MG PO TABS: 150.0000 mg | ORAL_TABLET | Freq: Two times a day (BID) | ORAL | 0 refills | Status: DC

## 2021-12-25 NOTE — Progress Notes (Signed)
Virtual Visit via Telephone Note  I connected with Dakota Vang on 12/25/21 at 10:20 AM EDT by telephone and verified that I am speaking with the correct person using two identifiers.  Location: Patient: Work Provider: Biomedical scientist   I discussed the limitations, risks, security and privacy concerns of performing an evaluation and management service by telephone and the availability of in person appointments. I also discussed with the patient that there may be a patient responsible charge related to this service. The patient expressed understanding and agreed to proceed.   History of Present Illness: Patient is evaluated by phone session.  He has been doing better now but he had a traumatic experience while he was on a cruise trip.  He acquired infection and he was shipped to [place] from [place].  He stayed in the hospital for 8 days and diagnosed with acute renal failure with a creatinine more than 10.  Patient told it was very difficult for the family and his wife call all of them and number to [place] to visit him.  He was given a lot of antibiotic and now he is feeling better.  He is working and he feels more productive.  He lost more than 15 pounds.  He reported things are much better now and he is feeling good.  He is sleeping better than before.  He is taking Ozempic and his last hemoglobin A1c was normal.  He is seeing Dr. Deforest Hoyles who recommend not to take the insulin since he is on Ozempic.  He is taking REXULTI full dose and he feels that helps him a lot.  Denies any anhedonia or any feeling of hopelessness or worthlessness.  He has no tremors, shakes or any EPS.  His attention, focus is good.  He is able to do multitasking and he feels more productive at work.  He is taking stimulant.  Like to keep his current medication.  Past Psychiatric History:  No h/o inpatient treatment or suicidal attempt.  Tried Prozac for many years until it stopped working.  We tried Cymbalta,  Klonopin, BuSpar and Lexapro but that did not help.  History of ADD.  No history of paranoia, hallucination or psychosis.    Psychiatric Specialty Exam: Physical Exam  Review of Systems  Weight 220 lb (99.8 kg).There is no height or weight on file to calculate BMI.  General Appearance: NA  Eye Contact:  NA  Speech:  Slow  Volume:  Normal  Mood:  Euthymic  Affect:  NA  Thought Process:  Goal Directed  Orientation:  Full (Time, Place, and Person)  Thought Content:  Logical  Suicidal Thoughts:  No  Homicidal Thoughts:  No  Memory:  Immediate;   Good Recent;   Good Remote;   Good  Judgement:  Good  Insight:  Present  Psychomotor Activity:  NA  Concentration:  Concentration: Good and Attention Span: Good  Recall:  Good  Fund of Knowledge:  Good  Language:  Good  Akathisia:  No  Handed:  Right  AIMS (if indicated):     Assets:  Communication Skills Desire for Improvement Housing Resilience Social Support Talents/Skills Transportation  ADL's:  Intact  Cognition:  WNL  Sleep:   ok      Assessment and Plan: Major depressive disorder, recurrent.  Anxiety.  ADD, inattentive type.  Patient had a traumatic experience while he was on cruise and he was lifted from the [facility] to [place] for acute renal failure.  He  is doing better and he reported his creatinine is back to normal.  He does not want to change the medication since it is working very well for his depression, anxiety and ADD symptoms.  He lost weight since the last visit.  Discussed medication side effects and benefits.  We will continue REXULTI 2 mg daily, Wellbutrin XL 300 mg daily, Lamictal 300 mg daily and Ritalin 20 mg 4 times a day.  He wants stimulant sent to the local pharmacy and all other medication [facility].  He has upcoming appointment with his PCP and I recommend to have his lab results faxed to [place].  I also recommend call us back if there is any question, concern or if he feels worsening  of the symptoms.  He has no rash, itching tremors or shakes.  Follow-up in 3 months.  Follow Up Instructions:    I discussed the assessment and treatment plan with the patient. The patient was provided an opportunity to ask questions and all were answered. The patient agreed with the plan and demonstrated an understanding of the instructions.   The patient was advised to call back or seek an in-person evaluation if the symptoms worsen or if the condition fails to improve as anticipated.  Collaboration of Care: Primary Care Provider AEB notes are available in epic to review.  Patient/Guardian was advised Release of Information must be obtained prior to any record release in order to collaborate their care with an outside provider. Patient/Guardian was advised if they have not already done so to contact the registration department to sign all necessary forms in order for [place] to release information regarding their care.   Consent: Patient/Guardian gives verbal consent for treatment and assignment of benefits for services provided during this visit. Patient/Guardian expressed understanding and agreed to proceed.    I provided 20 minutes of non-face-to-face time during this encounter.   Kathlee Nations, MD

## 2022-01-02 ENCOUNTER — Encounter: Payer: Self-pay | Admitting: Gastroenterology

## 2022-01-02 ENCOUNTER — Ambulatory Visit (AMBULATORY_SURGERY_CENTER): Payer: 59 | Admitting: Gastroenterology

## 2022-01-02 VITALS — BP 124/81 | HR 94 | Temp 97.5°F | Resp 16 | Ht 73.0 in | Wt 220.8 lb

## 2022-01-02 DIAGNOSIS — Z09 Encounter for follow-up examination after completed treatment for conditions other than malignant neoplasm: Secondary | ICD-10-CM | POA: Diagnosis present

## 2022-01-02 DIAGNOSIS — D125 Benign neoplasm of sigmoid colon: Secondary | ICD-10-CM

## 2022-01-02 DIAGNOSIS — K635 Polyp of colon: Secondary | ICD-10-CM | POA: Diagnosis not present

## 2022-01-02 DIAGNOSIS — Z800 Family history of malignant neoplasm of digestive organs: Secondary | ICD-10-CM

## 2022-01-02 DIAGNOSIS — Z86010 Personal history of colonic polyps: Secondary | ICD-10-CM | POA: Diagnosis not present

## 2022-01-02 MED ORDER — CIPROFLOXACIN HCL 500 MG PO TABS: 500.0000 mg | ORAL_TABLET | Freq: Two times a day (BID) | ORAL | 0 refills | Status: DC

## 2022-01-02 MED ORDER — SODIUM CHLORIDE 0.9 % IV SOLN: 500.0000 mL | INTRAVENOUS | Status: DC

## 2022-01-02 MED ORDER — METRONIDAZOLE 500 MG PO TABS: 500.0000 mg | ORAL_TABLET | Freq: Two times a day (BID) | ORAL | 0 refills | Status: DC

## 2022-01-02 NOTE — Progress Notes (Signed)
Called to room to assist during endoscopic procedure.  Patient ID and intended procedure confirmed with present staff. Received instructions for my participation in the procedure from the performing physician.  

## 2022-01-02 NOTE — Op Note (Signed)
[facility] Patient Name: Dakota Vang Procedure Date: 01/02/2022 8:42 AM MRN: [id number] Endoscopist: Ladene Artist , MD, [id number] Age: 56 Referring MD:  Date of Birth: [birth year] Gender: Male Account #: [id number] Procedure:                Colonoscopy Indications:              Surveillance: Personal history of adenomatous                            polyps on last colonoscopy 5 years ago, Family                            history of colon cancer, 1st-degree relative. Medicines:                Monitored Anesthesia Care Procedure:                Pre-Anesthesia Assessment:                           - Prior to the procedure, a History and Physical                            was performed, and patient medications and                            allergies were reviewed. The patient's tolerance of                            previous anesthesia was also reviewed. The risks                            and benefits of the procedure and the sedation                            options and risks were discussed with the patient.                            All questions were answered, and informed consent                            was obtained. Prior Anticoagulants: The patient has                            taken no anticoagulant or antiplatelet agents. ASA                            Grade Assessment: II - A patient with mild systemic                            disease. After reviewing the risks and benefits,                            the patient was deemed in satisfactory condition to  undergo the procedure.                           After obtaining informed consent, the colonoscope                            was passed under direct vision. Throughout the                            procedure, the patient's blood pressure, pulse, and                            oxygen saturations were monitored continuously. The                            Olympus CF-HQ190L  ([id number]) Colonoscope was                            introduced through the anus and advanced to the the                            cecum, identified by appendiceal orifice and                            ileocecal valve. The ileocecal valve, appendiceal                            orifice, and rectum were photographed. The quality                            of the bowel preparation was adequate. The patient                            tolerated the procedure well. The colonoscopy was                            somewhat difficult due to abnormal anatomy,                            descending colon stenosis. Successful completion of                            the procedure was aided by withdrawing the adult                            colonoscope and replacing with the UltraSlim scope. Scope In: 8:47:24 AM Scope Out: 9:09:57 AM Scope Withdrawal Time: 0 hours 11 minutes 18 seconds  Total Procedure Duration: 0 hours 22 minutes 33 seconds  Findings:                 The perianal and digital rectal examinations were                            normal.  Multiple small-mouthed diverticula were found in                            the right colon. There was no evidence of                            diverticular bleeding.                           Multiple medium-mouthed diverticula were found in                            the left colon. There was narrowing of the colon in                            association with the diverticular opening. There                            was evidence of diverticular spasm. Erythema was                            seen in association with the diverticular opening.                            Purulent discharge was seen in association with the                            diverticular opening, consistent with                            diverticulitis. There was no evidence of                            diverticular bleeding.                            A benign-appearing, intrinsic moderate stenosis                            measuring 3 cm (in length) x 1 cm (inner diameter)                            was found in the distal descending colon and was                            traversed.                           A 6 mm polyp was found in the sigmoid colon. The                            polyp was sessile. The polyp was removed with a  cold snare. Resection and retrieval were complete.                           The exam was otherwise without abnormality on                            direct and retroflexion views. Complications:            No immediate complications. Estimated blood loss:                            None. Estimated Blood Loss:     Estimated blood loss: none. Impression:               - Moderate diverticulosis in the right colon. There                            was no evidence of diverticular bleeding.                           - Severe diverticulosis in the left colon. There                            was narrowing of the colon in association with the                            diverticular opening. There was evidence of                            diverticular spasm. Erythema was seen in                            association with the diverticular opening. Purulent                            discharge was seen in association with the                            diverticular opening, indicative of diverticulitis.                            There was no evidence of diverticular bleeding.                           - Diverticular stricture in the distal descending                            colon.                           - One 6 mm polyp in the sigmoid colon, removed with                            a cold snare. Resected and retrieved.                           -  The examination was otherwise normal on direct                            and retroflexion views. Recommendation:           - Repeat  colonoscopy likely in 5 years for                            surveillance based on pathology results.                           - Patient has a contact number available for                            emergencies. The signs and symptoms of potential                            delayed complications were discussed with the                            patient. Return to normal activities tomorrow.                            Written discharge instructions were provided to the                            patient.                           - Resume previous diet.                           - Continue present medications.                           - Await pathology results.                           - Cipro (ciprofloxacin) 500 mg PO BID for 10 days.                           - Flagyl (metronidazole) 500 mg PO BID for 10 days. Ladene Artist, MD 01/02/2022 9:19:46 AM This report has been signed electronically.

## 2022-01-02 NOTE — Progress Notes (Signed)
Sedate, gd SR, tolerated procedure well, VSS, report to RN 

## 2022-01-02 NOTE — Patient Instructions (Signed)
Handout on polyps and diverticulosis given to patient. Await pathology results. Resume previous diet and continue present medications.  Pick up prescriptions for Cipro (ciprofloxacin) 500 mg twice a da for 10 days and Flagyl (metronidazole) 500 mg twice a day for 10 days from [facility] in [place].  YOU HAD AN ENDOSCOPIC PROCEDURE TODAY AT [facility]:   Refer to the procedure report that was given to you for any specific questions about what was found during the examination.  If the procedure report does not answer your questions, please call your gastroenterologist to clarify.  If you requested that your care partner not be given the details of your procedure findings, then the procedure report has been included in a sealed envelope for you to review at your convenience later.  YOU SHOULD EXPECT: Some feelings of bloating in the abdomen. Passage of more gas than usual.  Walking can help get rid of the air that was put into your GI tract during the procedure and reduce the bloating. If you had a lower endoscopy (such as a colonoscopy or flexible sigmoidoscopy) you may notice spotting of blood in your stool or on the toilet paper. If you underwent a bowel prep for your procedure, you may not have a normal bowel movement for a few days.  Please Note:  You might notice some irritation and congestion in your nose or some drainage.  This is from the oxygen used during your procedure.  There is no need for concern and it should clear up in a day or so.  SYMPTOMS TO REPORT IMMEDIATELY:  Following lower endoscopy (colonoscopy or flexible sigmoidoscopy):  Excessive amounts of blood in the stool  Significant tenderness or worsening of abdominal pains  Swelling of the abdomen that is new, acute  Fever of 100F or higher  For urgent or emergent issues, a gastroenterologist can be reached at any hour by calling [phone]. Do not use MyChart messaging for urgent concerns.     DIET:  We do recommend a small meal at first, but then you may proceed to your regular diet.  Drink plenty of fluids but you should avoid alcoholic beverages for 24 hours.  ACTIVITY:  You should plan to take it easy for the rest of today and you should NOT DRIVE or use heavy machinery until tomorrow (because of the sedation medicines used during the test).    FOLLOW UP: Our staff will call the number listed on your records the next business day following your procedure.  We will call around 7:15- 8:00 am to check on you and address any questions or concerns that you may have regarding the information given to you following your procedure. If we do not reach you, we will leave a message.     If any biopsies were taken you will be contacted by phone or by letter within the next 1-3 weeks.  Please call us at [phone] if you have not heard about the biopsies in 3 weeks.    SIGNATURES/CONFIDENTIALITY: You and/or your care partner have signed paperwork which will be entered into your electronic medical record.  These signatures attest to the fact that that the information above on your After Visit Summary has been reviewed and is understood.  Full responsibility of the confidentiality of this discharge information lies with you and/or your care-partner.

## 2022-01-02 NOTE — Progress Notes (Signed)
Pt's states no medical or surgical changes since previsit or office visit. 

## 2022-01-02 NOTE — Progress Notes (Signed)
History & Physical  Primary Care Physician:  Wenda Low, MD Primary Gastroenterologist: Lucio Edward, MD  CHIEF COMPLAINT:  Personal history of colon polyps   HPI: Dakota Vang is a 56 y.o. male with a history of adenomatous colon polyps here for colonoscopy.    Past Medical History:  Diagnosis Date   ADD (attention deficit disorder with hyperactivity)    Dr. Donalee Citrin   Anxiety    Dr. Donalee Citrin   Colon polyps 2008   Dr Fuller Plan   COLONIC POLYPS, HX OF 11/20/2007   Qualifier: Diagnosis of  By: Linna Darner MD, Gwyndolyn Saxon   Last colonoscopy negative 2013 PMH of plyps X 2; Dr Fuller Plan, GI     Depression    Diabetes [facility])    Diverticulitis    PMH of   Diverticulitis of jejunum with microperforation s/p lap resection [id number] 08/28/2012   Qualifier: Diagnosis of  By: Linna Darner MD, Gwyndolyn Saxon   6/1-3/14 [facility] : ? Jejunal perforation F/U CT recommended 08/24/12    Diverticulitis of sigmoid colon 06/24/2008   2005 by CT scan     Diverticulosis 2002   Dr Velora Heckler   Duodenal diverticulum 08/28/2012   GERD (gastroesophageal reflux disease)    Hyperlipidemia    Hypertension    Pancolonic diverticulosis 09/09/2012    Past Surgical History:  Procedure Laterality Date   COLON SURGERY     COLONOSCOPY  2013   Dr Fuller Plan   COLONOSCOPY W/ POLYPECTOMY  2002 & 2008   Dr. Fuller Plan; polyps X 2   EYE SURGERY     INGUINAL HERNIA REPAIR Bilateral 10/22/2019   Procedure: LAPAROSCOPIC BILATERAL INGUINAL HERNIA REPAIR WITH MESH;  Surgeon: Jovann Boston, MD;  Location: [facility];  Service: General;  Laterality: Bilateral;   LAPAROSCOPIC PARTIAL COLECTOMY N/A 09/24/2012   Procedure: laparoscopic assisted  resection of proximal jejunum containing the jejunal diverticulum;  Surgeon: [name], MD;  Location: [facility];  Service: General;  Laterality: N/A;   LAPAROSCOPIC SMALL BOWEL RESECTION  09/24/2012   excision of proximal jejunum for jejunal diverticulitis   LASIK     bilaterally    TONSILLECTOMY     VENTRAL HERNIA REPAIR N/A 10/22/2019   Procedure: LAPAROSCOPIC incarcerated VENTRAL WALL HERNIA REPAIR WITH MESH. tap block;  Surgeon: Bonny Boston, MD;  Location: [facility];  Service: General;  Laterality: N/A;    Prior to Admission medications   Medication Sig Start Date End Date Taking? Authorizing Provider  acetaminophen (TYLENOL) 500 MG tablet Take 500 mg by mouth every 6 (six) hours as needed.   Yes [provider]  amLODipine (NORVASC) 5 MG tablet Take 5 mg by mouth daily.   Yes [provider]  aspirin EC 81 MG tablet Take 81 mg by mouth daily.    Yes [provider]  brexpiprazole (REXULTI) 2 MG TABS tablet Take 1 tablet (2 mg total) by mouth daily. 12/25/21  Yes Arfeen, Arlyce Harman, MD  buPROPion (WELLBUTRIN XL) 300 MG 24 hr tablet Take 1 tablet (300 mg total) by mouth daily. 12/25/21  Yes Arfeen, Arlyce Harman, MD  JARDIANCE 25 MG TABS tablet Take 25 mg by mouth daily. 11/06/21  Yes [provider]  lamoTRIgine (LAMICTAL) 150 MG tablet Take 1 tablet (150 mg total) by mouth 2 (two) times daily. 12/25/21  Yes Arfeen, Arlyce Harman, MD  methylphenidate (RITALIN) 20 MG tablet Take 1 tablet (20 mg total) by mouth 4 (four) times daily. 12/25/21  Yes Arfeen, Arlyce Harman, MD  metoprolol succinate (TOPROL-XL) 50 MG 24 hr tablet Take 50 mg by mouth daily. 11/26/21  Yes [provider]  OZEMPIC, 1 MG/DOSE, 4 MG/3ML SOPN Inject 1 mg into the skin once a week. 12/23/21  Yes [provider]  rosuvastatin (CRESTOR) 10 MG tablet Take 1 tablet (10 mg total) by mouth daily. 07/27/15  Yes Midge Minium, MD  ARIPiprazole (ABILIFY) 5 MG tablet Take 0.5 tablets (2.5 mg total) by mouth daily. Patient not taking: Reported on 12/25/2021 03/28/21   Arfeen, Arlyce Harman, MD  BD PEN NEEDLE NANO U/F 32G X 4 MM MISC SMARTSIG:injection Twice Daily 12/17/21   [provider]  clindamycin (CLEOCIN T) 1 % external solution Apply 1 application topically as needed (for  breakouts on neck).     [provider]  clotrimazole (LOTRIMIN) 1 % external solution 3 (three) times daily. Patient not taking: Reported on 12/12/2021 09/25/21   [provider]  fluticasone (FLONASE) 50 MCG/ACT nasal spray Place 2 sprays into both nostrils daily as needed (seasonal allergies.).     [provider]  metoprolol tartrate (LOPRESSOR) 50 MG tablet Take 50 mg by mouth 2 (two) times daily. Patient not taking: Reported on 01/02/2022 10/24/21   [provider]  omeprazole (PRILOSEC) 20 MG capsule Take 20 mg by mouth daily before breakfast.     [provider]  OZEMPIC, 0.25 OR 0.5 MG/DOSE, 2 MG/3ML SOPN Inject into the skin. Patient not taking: Reported on 01/02/2022 11/26/21   [provider]  TRUE METRIX BLOOD GLUCOSE TEST test strip 1 each 2 (two) times daily. 10/24/21   [provider]  TRUEplus Lancets 30G [place]  10/24/21   [provider]  metFORMIN (GLUCOPHAGE) 500 MG tablet Take 500 mg by mouth daily. 12/13/20   Wenda Low, MD    Current Outpatient Medications  Medication Sig Dispense Refill   acetaminophen (TYLENOL) 500 MG tablet Take 500 mg by mouth every 6 (six) hours as needed.     amLODipine (NORVASC) 5 MG tablet Take 5 mg by mouth daily.     aspirin EC 81 MG tablet Take 81 mg by mouth daily.      brexpiprazole (REXULTI) 2 MG TABS tablet Take 1 tablet (2 mg total) by mouth daily. 90 tablet 0   buPROPion (WELLBUTRIN XL) 300 MG 24 hr tablet Take 1 tablet (300 mg total) by mouth daily. 90 tablet 0   JARDIANCE 25 MG TABS tablet Take 25 mg by mouth daily.     lamoTRIgine (LAMICTAL) 150 MG tablet Take 1 tablet (150 mg total) by mouth 2 (two) times daily. 180 tablet 0   methylphenidate (RITALIN) 20 MG tablet Take 1 tablet (20 mg total) by mouth 4 (four) times daily. 120 tablet 0   metoprolol succinate (TOPROL-XL) 50 MG 24 hr tablet Take 50 mg by mouth daily.     OZEMPIC, 1 MG/DOSE, 4 MG/3ML SOPN Inject 1 mg  into the skin once a week.     rosuvastatin (CRESTOR) 10 MG tablet Take 1 tablet (10 mg total) by mouth daily. 90 tablet 1   ARIPiprazole (ABILIFY) 5 MG tablet Take 0.5 tablets (2.5 mg total) by mouth daily. (Patient not taking: Reported on 12/25/2021) 45 tablet 0   BD PEN NEEDLE NANO U/F 32G X 4 MM MISC SMARTSIG:injection Twice Daily     clindamycin (CLEOCIN T) 1 % external solution Apply 1 application topically as needed (for breakouts on neck).      clotrimazole (LOTRIMIN) 1 %  external solution 3 (three) times daily. (Patient not taking: Reported on 12/12/2021)     fluticasone (FLONASE) 50 MCG/ACT nasal spray Place 2 sprays into both nostrils daily as needed (seasonal allergies.).      metoprolol tartrate (LOPRESSOR) 50 MG tablet Take 50 mg by mouth 2 (two) times daily. (Patient not taking: Reported on 01/02/2022)     omeprazole (PRILOSEC) 20 MG capsule Take 20 mg by mouth daily before breakfast.      OZEMPIC, 0.25 OR 0.5 MG/DOSE, 2 MG/3ML SOPN Inject into the skin. (Patient not taking: Reported on 01/02/2022)     TRUE METRIX BLOOD GLUCOSE TEST test strip 1 each 2 (two) times daily.     TRUEplus Lancets 30G MISC      Current Facility-Administered Medications  Medication Dose Route Frequency Provider Last Rate Last Admin   0.9 %  sodium chloride infusion  500 mL Intravenous Continuous Ladene Artist, MD        Allergies as of 01/02/2022 - Review Complete 01/02/2022  Allergen Reaction Noted   Metformin and related Other (See Comments) 12/12/2021   Morphine and related  10/22/2019   Penicillins Other (See Comments)     Family History  Problem Relation Age of Onset   Coronary artery disease Mother        CAGB X 2 & angio 3-4X   Cancer Mother        Gallbladder   Colon polyps Father    Colon cancer Father        in 73s   Lung cancer Father        smoker; asbestos exposure   Cancer Father        Mesothelioma   Diabetes Paternal Uncle        Dialysis   Coronary artery disease  Maternal Grandmother    COPD Maternal Grandmother    Heart attack Maternal Grandfather        late 60's   Heart attack Paternal Grandfather 69   Stroke Neg Hx    Esophageal cancer Neg Hx    Stomach cancer Neg Hx    Rectal cancer Neg Hx     Social History   Socioeconomic History   Marital status: Married    Spouse name: Not on file   Number of children: Not on file   Years of education: Not on file   Highest education level: Not on file  Occupational History   Not on file  Tobacco Use   Smoking status: Former    Packs/day: 0.25    Types: Cigarettes    Quit date: 2018    Years since quitting: 5.8   Smokeless tobacco: Never   Tobacco comments:    smoked 1988 ,up to 1 ppd. 08/22/11 averaging 2-3 /day cigarettes until 2018  Vaping Use   Vaping Use: Never used  Substance and Sexual Activity   Alcohol use: Not Currently    Comment: occasionally, socially on weekends 1-2 beers   Drug use: Never   Sexual activity: Yes  Other Topics Concern   Not on file  Social History Narrative   Not on file   Social Determinants of Health   Financial Resource Strain: Not on file  Food Insecurity: Not on file  Transportation Needs: Not on file  Physical Activity: Not on file  Stress: Not on file  Social Connections: Not on file  Intimate Partner Violence: Not on file    Review of Systems:  All systems reviewed were negative except where noted  in HPI.   Physical Exam: General:  Alert, well-developed, in NAD Head:  Normocephalic and atraumatic. Eyes:  Sclera clear, no icterus.   Conjunctiva pink. Ears:  Normal auditory acuity. Mouth:  No deformity or lesions.  Neck:  Supple; no masses . Lungs:  Clear throughout to auscultation.   No wheezes, crackles, or rhonchi. No acute distress. Heart:  Regular rate and rhythm; no murmurs. Abdomen:  Soft, nondistended, nontender. No masses, hepatomegaly. No obvious masses.  Normal bowel .    Rectal:  Deferred   Msk:  Symmetrical without  gross deformities.. Pulses:  Normal pulses noted. Extremities:  Without edema. Neurologic:  Alert and  oriented x4;  grossly normal neurologically. Skin:  Intact without significant lesions or rashes. Cervical Nodes:  No significant cervical adenopathy. Psych:  Alert and cooperative. Normal mood and affect.  Impression / Plan:   Personal history of adenomatous colon polyps for surveillance colonoscopy.  Pricilla Riffle. Fuller Plan  01/02/2022, 8:35 AM See Shea Evans, [facility], to contact our on call provider

## 2022-01-03 ENCOUNTER — Telehealth: Payer: Self-pay

## 2022-01-03 NOTE — Telephone Encounter (Signed)
  Follow up Call-     01/02/2022    7:49 AM  Call back number  Post procedure Call Back phone  # [phone]  Permission to leave phone message Yes     Patient questions:  Do you have a fever, pain , or abdominal swelling? No. Pain Score  0 *  Have you tolerated food without any problems? Yes.    Have you been able to return to your normal activities? Yes.    Do you have any questions about your discharge instructions: Diet   No. Medications  No. Follow up visit  No.  Do you have questions or concerns about your Care? No.  Actions: * If pain score is 4 or above: No action needed, pain <4.

## 2022-01-17 ENCOUNTER — Encounter: Payer: Self-pay | Admitting: Gastroenterology

## 2022-02-11 ENCOUNTER — Telehealth (HOSPITAL_COMMUNITY): Payer: Self-pay | Admitting: *Deleted

## 2022-02-11 DIAGNOSIS — F900 Attention-deficit hyperactivity disorder, predominantly inattentive type: Secondary | ICD-10-CM

## 2022-02-11 MED ORDER — METHYLPHENIDATE HCL 20 MG PO TABS: 20.0000 mg | ORAL_TABLET | Freq: Four times a day (QID) | ORAL | 0 refills | Status: DC

## 2022-02-11 NOTE — Telephone Encounter (Signed)
Done

## 2022-02-11 NOTE — Telephone Encounter (Signed)
Pt called requesting a refill of the Methylphenidate 20 mg tab. # 120 sent on 12/25/21 as pt takes qid. Pt has an upcoming appointment scheduled on 03/26/22. Thanks.

## 2022-02-11 NOTE — Telephone Encounter (Signed)
Please send to [facility] in [place]. Pharmacy in [place]. Pharmacy is listed in [place].

## 2022-02-15 ENCOUNTER — Telehealth (HOSPITAL_COMMUNITY): Payer: Self-pay | Admitting: *Deleted

## 2022-02-15 DIAGNOSIS — F900 Attention-deficit hyperactivity disorder, predominantly inattentive type: Secondary | ICD-10-CM

## 2022-02-15 MED ORDER — METHYLPHENIDATE HCL 20 MG PO TABS: 20.0000 mg | ORAL_TABLET | Freq: Four times a day (QID) | ORAL | 0 refills | Status: DC

## 2022-02-15 NOTE — Telephone Encounter (Signed)
Done

## 2022-02-15 NOTE — Telephone Encounter (Signed)
Pt called statint hat his pharmacy is pout of stock on the Methylphenidate 20 mg. Writer confirmed this and called several pharmacies to find. [facility] [address] [facility] in [facility] to fill script today. Please send there. Thanks.

## 2022-02-18 ENCOUNTER — Other Ambulatory Visit (HOSPITAL_COMMUNITY): Payer: Self-pay | Admitting: Psychiatry

## 2022-02-18 DIAGNOSIS — F330 Major depressive disorder, recurrent, mild: Secondary | ICD-10-CM

## 2022-02-18 DIAGNOSIS — F411 Generalized anxiety disorder: Secondary | ICD-10-CM

## 2022-03-10 ENCOUNTER — Other Ambulatory Visit (HOSPITAL_COMMUNITY): Payer: Self-pay | Admitting: Psychiatry

## 2022-03-10 DIAGNOSIS — F330 Major depressive disorder, recurrent, mild: Secondary | ICD-10-CM

## 2022-03-10 DIAGNOSIS — F411 Generalized anxiety disorder: Secondary | ICD-10-CM

## 2022-03-13 ENCOUNTER — Other Ambulatory Visit (HOSPITAL_COMMUNITY): Payer: Self-pay | Admitting: Psychiatry

## 2022-03-15 ENCOUNTER — Telehealth (HOSPITAL_COMMUNITY): Payer: Self-pay

## 2022-03-15 DIAGNOSIS — F900 Attention-deficit hyperactivity disorder, predominantly inattentive type: Secondary | ICD-10-CM

## 2022-03-15 MED ORDER — METHYLPHENIDATE HCL 20 MG PO TABS: 20.0000 mg | ORAL_TABLET | Freq: Four times a day (QID) | ORAL | 0 refills | Status: DC

## 2022-03-15 NOTE — Telephone Encounter (Signed)
Done. Please inform patient

## 2022-03-15 NOTE — Telephone Encounter (Signed)
Patient called requesting a refill for the following medication  Last visit 12/25/21 Next visit 03/19/22      Disp Refills Start End   methylphenidate (RITALIN) 20 MG tablet 120 tablet 0 02/15/2022    Sig - Route: Take 1 tablet (20 mg total) by mouth 4 (four) times daily. - Oral   Sent to pharmacy as: methylphenidate (RITALIN) 20 MG tablet   Earliest Fill Date: 02/15/2022   Notes to Pharmacy: Do not fill until 02/12/2022   E-Prescribing Status: Receipt confirmed by pharmacy (02/15/2022 12:03 PM EST)    Patient would like for the medication to be sent to the following pharmacy [id number] [place] [place] Phone: [phone] Fax: [phone]

## 2022-03-19 ENCOUNTER — Telehealth (HOSPITAL_BASED_OUTPATIENT_CLINIC_OR_DEPARTMENT_OTHER): Payer: 59 | Admitting: Psychiatry

## 2022-03-19 ENCOUNTER — Encounter (HOSPITAL_COMMUNITY): Payer: Self-pay | Admitting: Psychiatry

## 2022-03-19 DIAGNOSIS — F900 Attention-deficit hyperactivity disorder, predominantly inattentive type: Secondary | ICD-10-CM | POA: Diagnosis not present

## 2022-03-19 DIAGNOSIS — F330 Major depressive disorder, recurrent, mild: Secondary | ICD-10-CM | POA: Diagnosis not present

## 2022-03-19 DIAGNOSIS — F411 Generalized anxiety disorder: Secondary | ICD-10-CM | POA: Diagnosis not present

## 2022-03-19 MED ORDER — BUPROPION HCL ER (XL) 300 MG PO TB24: 300.0000 mg | ORAL_TABLET | Freq: Every day | ORAL | 0 refills | Status: DC

## 2022-03-19 MED ORDER — LAMOTRIGINE 150 MG PO TABS: 150.0000 mg | ORAL_TABLET | Freq: Two times a day (BID) | ORAL | 0 refills | Status: DC

## 2022-03-19 MED ORDER — BREXPIPRAZOLE 2 MG PO TABS: 2.0000 mg | ORAL_TABLET | Freq: Every day | ORAL | 0 refills | Status: DC

## 2022-03-19 MED ORDER — METHYLPHENIDATE HCL 20 MG PO TABS: 20.0000 mg | ORAL_TABLET | Freq: Four times a day (QID) | ORAL | 0 refills | Status: DC

## 2022-03-19 NOTE — Progress Notes (Signed)
Virtual Visit via Telephone Note  I connected with Dhaval Woo Rindfleisch on 03/19/22 at  4:00 PM EST by telephone and verified that I am speaking with the correct person using two identifiers.  Location: Patient: In Car Provider: Home Office   I discussed the limitations, risks, security and privacy concerns of performing an evaluation and management service by telephone and the availability of in person appointments. I also discussed with the patient that there may be a patient responsible charge related to this service. The patient expressed understanding and agreed to proceed.   History of Present Illness: Patient is evaluated by phone session.  He admitted having trouble getting his methylphenidate from the pharmacy.  He picked up partial medication because they do not have enough.  He will need a new prescription of methylphenidate # 44.  Overall he is doing well.  He is able to focus, multitask and his job is going very well.  He recently saw Dr. Deforest Hoyles and he is pleased that his labs are good.  He lost weight since the last visit.  He liked the [place] denies any tremors or shakes or any EPS.  He denies any anhedonia.  He is excited about another cruise trip to [place].  He recalled last time he got very sick but he really enjoys these cruise trip.  He is going with his wife and another couple.  He has appointment with his PCP in February for hemoglobin A1c.  His last hemoglobin A1c was 5.9.  Denies drinking or using any illegal substances.     Past Psychiatric History:  No h/o inpatient treatment or suicidal attempt.  Tried Prozac for many years until it stopped working.  We tried Cymbalta, Klonopin, BuSpar and Lexapro but that did not help.  History of ADD.  No history of paranoia, hallucination or psychosis.  Abilify helped but weight gain and increased blood sugar  Psychiatric Specialty Exam: Physical Exam  Review of Systems  Weight 215 lb (97.5 kg).There is no height or weight on file  to calculate BMI.  General Appearance: NA  Eye Contact:  NA  Speech:  Slow  Volume:  Normal  Mood:  Euthymic  Affect:  NA  Thought Process:  Goal Directed  Orientation:  Full (Time, Place, and Person)  Thought Content:  Rumination  Suicidal Thoughts:  No  Homicidal Thoughts:  No  Memory:  Immediate;   Good Recent;   Good Remote;   Good  Judgement:  Good  Insight:  Present  Psychomotor Activity:  NA  Concentration:  Concentration: Good and Attention Span: Good  Recall:  Good  Fund of Knowledge:  Good  Language:  Good  Akathisia:  No  Handed:  Right  AIMS (if indicated):     Assets:  Communication Skills Desire for Improvement Housing Resilience Social Support Talents/Skills Transportation  ADL's:  Intact  Cognition:  WNL  Sleep:   ok      Assessment and Plan: Major depressive disorder, recurrent.  Anxiety.  ADHD, inattentive type.  Patient doing better on his medication.  He is again going for a cruise to [place].  He had a traumatic experience on his past cruise trip but he really enjoyed the cruise trip.  Discusses having issues getting his prescription filled for his methylphenidate.  He picked up partial prescription because pharmacy do not have enough and he like to have a new prescription with partial left over.  We will give a new prescription Ritalin 20 mg 4 times a  day #44.  Continue Wellbutrin XL 300 mg daily, Lamictal 300 mg daily and REXULTI 2 mg daily which [facility].  Encourage to continue to watch his diet.  Recommended to call us back if is any question or any concern.  Follow-up in 3 months.  Follow Up Instructions:    I discussed the assessment and treatment plan with the patient. The patient was provided an opportunity to ask questions and all were answered. The patient agreed with the plan and demonstrated an understanding of the instructions.   The patient was advised to call back or seek an in-person evaluation if the symptoms  worsen or if the condition fails to improve as anticipated.  Collaboration of Care: Other provider involved in patient's care AEB notes are available in epic to review.  Patient/Guardian was advised Release of Information must be obtained prior to any record release in order to collaborate their care with an outside provider. Patient/Guardian was advised if they have not already done so to contact the registration department to sign all necessary forms in order for [place] to release information regarding their care.   Consent: Patient/Guardian gives verbal consent for treatment and assignment of benefits for services provided during this visit. Patient/Guardian expressed understanding and agreed to proceed.    I provided 16 minutes of non-face-to-face time during this encounter.   Kathlee Nations, MD

## 2022-03-26 ENCOUNTER — Telehealth (HOSPITAL_COMMUNITY): Payer: 59 | Admitting: Psychiatry

## 2022-04-05 ENCOUNTER — Telehealth (HOSPITAL_COMMUNITY): Payer: Self-pay | Admitting: *Deleted

## 2022-04-05 DIAGNOSIS — F900 Attention-deficit hyperactivity disorder, predominantly inattentive type: Secondary | ICD-10-CM

## 2022-04-05 MED ORDER — METHYLPHENIDATE HCL 20 MG PO TABS: 20.0000 mg | ORAL_TABLET | Freq: Four times a day (QID) | ORAL | 0 refills | Status: DC

## 2022-04-05 NOTE — Telephone Encounter (Signed)
[facility]. Spoke to pharmacist Claiborne Billings to inform patient.

## 2022-04-05 NOTE — Telephone Encounter (Signed)
Pt called requesting refill of Ritalin 20 mg [place] [facility] [place] Writer advised calling different pharmacies to find medication.pt called back stating that [facility] [address] [facility] this medication in stock. Pharmacy added to profile. Please review.

## 2022-04-05 NOTE — Telephone Encounter (Signed)
We did partial refills with 44 pills on January 9 since he could not get all the medication at 1 time.  He is not due until February 9.  In the future we will not do partial refill. He will need to call us which pharmacy has stock when he is due next time.

## 2022-04-15 ENCOUNTER — Telehealth (HOSPITAL_COMMUNITY): Payer: Self-pay | Admitting: *Deleted

## 2022-04-15 DIAGNOSIS — F900 Attention-deficit hyperactivity disorder, predominantly inattentive type: Secondary | ICD-10-CM

## 2022-04-15 MED ORDER — METHYLPHENIDATE HCL 20 MG PO TABS: 20.0000 mg | ORAL_TABLET | Freq: Four times a day (QID) | ORAL | 0 refills | Status: DC

## 2022-04-15 NOTE — Telephone Encounter (Signed)
Pt called requesting refill of Methylphenidate 20 mg QID. Partial fill of #44 was swnt in on 04/05/22. Pt last appointment was on 03/19/22 and next appointment scheduled fpr 06/18/22. [facility] [place]. Pt has verified they have the medication in stock.

## 2022-04-15 NOTE — Telephone Encounter (Signed)
Done

## 2022-05-13 ENCOUNTER — Telehealth (HOSPITAL_COMMUNITY): Payer: Self-pay | Admitting: *Deleted

## 2022-05-13 DIAGNOSIS — F900 Attention-deficit hyperactivity disorder, predominantly inattentive type: Secondary | ICD-10-CM

## 2022-05-13 MED ORDER — METHYLPHENIDATE HCL 20 MG PO TABS: 20.0000 mg | ORAL_TABLET | Freq: Four times a day (QID) | ORAL | 0 refills | Status: DC

## 2022-05-13 NOTE — Telephone Encounter (Signed)
Pt called requesting refill of the Ritalin 20 mg QID. Last fill was on 04/15/22. Pt states that [facility] [place] and they have enough in stock to fill today. Pt's next scheduled appointment is on 06/18/22. Please review.

## 2022-05-13 NOTE — Telephone Encounter (Signed)
Done

## 2022-05-16 ENCOUNTER — Telehealth: Payer: Self-pay | Admitting: Gastroenterology

## 2022-05-16 DIAGNOSIS — D125 Benign neoplasm of sigmoid colon: Secondary | ICD-10-CM

## 2022-05-16 DIAGNOSIS — Z86010 Personal history of colonic polyps: Secondary | ICD-10-CM

## 2022-05-16 DIAGNOSIS — Z800 Family history of malignant neoplasm of digestive organs: Secondary | ICD-10-CM

## 2022-05-16 MED ORDER — METRONIDAZOLE 500 MG PO TABS: 500.0000 mg | ORAL_TABLET | Freq: Two times a day (BID) | ORAL | 0 refills | Status: DC

## 2022-05-16 MED ORDER — CIPROFLOXACIN HCL 500 MG PO TABS: 500.0000 mg | ORAL_TABLET | Freq: Two times a day (BID) | ORAL | 0 refills | Status: AC

## 2022-05-16 NOTE — Telephone Encounter (Signed)
Patient is requesting refill on Flagyl and Cipro medications sent to [facility] in [place].

## 2022-05-16 NOTE — Telephone Encounter (Signed)
This patient has not been seen since procedure in October 2023, and no office visit since 2018.    I called patient and he thinks he may have a diverticulitis flare.  Abdominal pain  with pressure is an 8, he has waited 4 days before calling. No nausea or vomiting No fever    Dr Fuller Plan will you ok him to have  a prescription of Cipro and Flagyl or does he have to be seen first?  He said he can't wait for an appointment

## 2022-05-16 NOTE — Telephone Encounter (Signed)
Cipro 500 mg po bid, #20 Flagyl 500 mg po bid, #20 Contact us or go to nearest ED if symptoms do not improve or they worsen

## 2022-05-16 NOTE — Telephone Encounter (Signed)
Sent in Cipro and Flagyl to [facility] patient and informed and to go to the ED if his symptoms worsen

## 2022-05-18 ENCOUNTER — Other Ambulatory Visit (HOSPITAL_COMMUNITY): Payer: Self-pay | Admitting: Psychiatry

## 2022-05-18 DIAGNOSIS — F411 Generalized anxiety disorder: Secondary | ICD-10-CM

## 2022-05-18 DIAGNOSIS — F330 Major depressive disorder, recurrent, mild: Secondary | ICD-10-CM

## 2022-05-25 ENCOUNTER — Other Ambulatory Visit (HOSPITAL_COMMUNITY): Payer: Self-pay | Admitting: Psychiatry

## 2022-05-25 DIAGNOSIS — F330 Major depressive disorder, recurrent, mild: Secondary | ICD-10-CM

## 2022-05-30 ENCOUNTER — Other Ambulatory Visit (HOSPITAL_COMMUNITY): Payer: Self-pay | Admitting: Psychiatry

## 2022-05-30 DIAGNOSIS — F330 Major depressive disorder, recurrent, mild: Secondary | ICD-10-CM

## 2022-06-12 ENCOUNTER — Telehealth (HOSPITAL_COMMUNITY): Payer: Self-pay | Admitting: *Deleted

## 2022-06-12 DIAGNOSIS — F900 Attention-deficit hyperactivity disorder, predominantly inattentive type: Secondary | ICD-10-CM

## 2022-06-12 MED ORDER — METHYLPHENIDATE HCL 20 MG PO TABS: 20.0000 mg | ORAL_TABLET | Freq: Four times a day (QID) | ORAL | 0 refills | Status: DC

## 2022-06-12 NOTE — Telephone Encounter (Signed)
Pt called requesting a refil of the Methylphenidate 20 mg QID. Last e-scribed on 05/13/22. Pt has a f/u scheduled for 06/18/22.  Please send to [facility] in [place]. Pharmacy is in his profile.

## 2022-06-12 NOTE — Telephone Encounter (Signed)
Sent to [facility].

## 2022-06-18 ENCOUNTER — Encounter (HOSPITAL_COMMUNITY): Payer: Self-pay | Admitting: Psychiatry

## 2022-06-18 ENCOUNTER — Telehealth (HOSPITAL_BASED_OUTPATIENT_CLINIC_OR_DEPARTMENT_OTHER): Payer: 59 | Admitting: Psychiatry

## 2022-06-18 DIAGNOSIS — F900 Attention-deficit hyperactivity disorder, predominantly inattentive type: Secondary | ICD-10-CM

## 2022-06-18 DIAGNOSIS — F330 Major depressive disorder, recurrent, mild: Secondary | ICD-10-CM

## 2022-06-18 DIAGNOSIS — F411 Generalized anxiety disorder: Secondary | ICD-10-CM

## 2022-06-18 MED ORDER — BREXPIPRAZOLE 2 MG PO TABS: 2.0000 mg | ORAL_TABLET | Freq: Every day | ORAL | 0 refills | Status: DC

## 2022-06-18 MED ORDER — BUPROPION HCL ER (XL) 300 MG PO TB24: 300.0000 mg | ORAL_TABLET | Freq: Every day | ORAL | 0 refills | Status: DC

## 2022-06-18 MED ORDER — LAMOTRIGINE 150 MG PO TABS: 150.0000 mg | ORAL_TABLET | Freq: Two times a day (BID) | ORAL | 0 refills | Status: DC

## 2022-06-18 NOTE — Progress Notes (Signed)
[facility] MD Virtual Progress Note   Patient Location: In car Provider Location: Home Office  I connect with patient by telephone and verified that I am speaking with correct person by using two identifiers. I discussed the limitations of evaluation and management by telemedicine and the availability of in person appointments. I also discussed with the patient that there may be a patient responsible charge related to this service. The patient expressed understanding and agreed to proceed.  Darcel SmallingMichael A Ouzts [id number] 57 y.o.  06/18/2022 3:45 PM  History of Present Illness:  Patient is evaluated by phone session.  He is in the car outside his office.  He feels things are going very well and he denies any crying spells, feeling of hopelessness or worthlessness.  He recently had a visit with his primary care and he was pleased that his labs are not good especially his hemoglobin A1c and creatinine.  He reported his focus attention concentration is good.  However he admitted he had a hard time getting his prescription filled at the pharmacy.  His appetite is okay and his weight is stable.  He liked the Ozempic which is helping his blood sugar and weight stable.  His family doing very well.  He has no issue at work.  He has no tremors, shakes or any EPS.  Denies drinking or using any illegal substances.  Patient like to keep the current medicine which is working for him.  He has no panic attack.  Past Psychiatric History: No h/o inpatient treatment or suicidal attempt.  Tried Prozac for many years until it stopped working.  We tried Cymbalta, Klonopin, BuSpar and Lexapro but that did not help.  History of ADD.  No history of paranoia, hallucination or psychosis.  Abilify helped but weight gain and increased blood sugar    Outpatient Encounter Medications as of 06/18/2022  Medication Sig   acetaminophen (TYLENOL) 500 MG tablet Take 500 mg by mouth every 6 (six) hours as needed.    amLODipine (NORVASC) 5 MG tablet Take 5 mg by mouth daily.   ARIPiprazole (ABILIFY) 5 MG tablet Take 0.5 tablets (2.5 mg total) by mouth daily. (Patient not taking: Reported on 12/25/2021)   aspirin EC 81 MG tablet Take 81 mg by mouth daily.    BD PEN NEEDLE NANO U/F 32G X 4 MM MISC SMARTSIG:injection Twice Daily   brexpiprazole (REXULTI) 2 MG TABS tablet Take 1 tablet (2 mg total) by mouth daily.   buPROPion (WELLBUTRIN XL) 300 MG 24 hr tablet Take 1 tablet (300 mg total) by mouth daily.   ciprofloxacin (CIPRO) 500 MG tablet Take 1 tablet (500 mg total) by mouth 2 (two) times daily.   clindamycin (CLEOCIN T) 1 % external solution Apply 1 application topically as needed (for breakouts on neck).    clotrimazole (LOTRIMIN) 1 % external solution 3 (three) times daily. (Patient not taking: Reported on 12/12/2021)   fluticasone (FLONASE) 50 MCG/ACT nasal spray Place 2 sprays into both nostrils daily as needed (seasonal allergies.).    JARDIANCE 25 MG TABS tablet Take 25 mg by mouth daily.   lamoTRIgine (LAMICTAL) 150 MG tablet Take 1 tablet (150 mg total) by mouth 2 (two) times daily.   methylphenidate (RITALIN) 20 MG tablet Take 1 tablet (20 mg total) by mouth 4 (four) times daily.   metoprolol succinate (TOPROL-XL) 50 MG 24 hr tablet Take 50 mg by mouth daily.   metoprolol tartrate (LOPRESSOR) 50 MG tablet Take 50 mg by mouth 2 (  two) times daily. (Patient not taking: Reported on 01/02/2022)   metroNIDAZOLE (FLAGYL) 500 MG tablet Take 1 tablet (500 mg total) by mouth 2 (two) times daily.   omeprazole (PRILOSEC) 20 MG capsule Take 20 mg by mouth daily before breakfast.    OZEMPIC, 0.25 OR 0.5 MG/DOSE, 2 MG/3ML SOPN Inject into the skin. (Patient not taking: Reported on 01/02/2022)   OZEMPIC, 1 MG/DOSE, 4 MG/3ML SOPN Inject 1 mg into the skin once a week.   rosuvastatin (CRESTOR) 10 MG tablet Take 1 tablet (10 mg total) by mouth daily.   TRUE METRIX BLOOD GLUCOSE TEST test strip 1 each 2 (two) times  daily.   TRUEplus Lancets 30G MISC    [DISCONTINUED] metFORMIN (GLUCOPHAGE) 500 MG tablet Take 500 mg by mouth daily.   Facility-Administered Encounter Medications as of 06/18/2022  Medication   0.9 %  sodium chloride infusion    No results found for this or any previous visit (from the past 2160 hour(s)).   Psychiatric Specialty Exam: Physical Exam  Review of Systems  Weight 215 lb (97.5 kg).There is no height or weight on file to calculate BMI.  General Appearance: NA  Eye Contact:  NA  Speech:  Clear and Coherent and Normal Rate  Volume:  Normal  Mood:  Euthymic  Affect:  Appropriate  Thought Process:  Goal Directed  Orientation:  Full (Time, Place, and Person)  Thought Content:  WDL  Suicidal Thoughts:  No  Homicidal Thoughts:  No  Memory:  Immediate;   Good Recent;   Good Remote;   Good  Judgement:  Good  Insight:  Present  Psychomotor Activity:  NA  Concentration:  Concentration: Good and Attention Span: Good  Recall:  Good  Fund of Knowledge:  Good  Language:  Good  Akathisia:  No  Handed:  Right  AIMS (if indicated):     Assets:  Communication Skills Desire for Improvement Housing Social Support Talents/Skills Transportation  ADL's:  Intact  Cognition:  WNL  Sleep:  ok     Assessment/Plan: Major depressive disorder, recurrent episode, mild - Plan: brexpiprazole (REXULTI) 2 MG TABS tablet, buPROPion (WELLBUTRIN XL) 300 MG 24 hr tablet, lamoTRIgine (LAMICTAL) 150 MG tablet  Generalized anxiety disorder - Plan: buPROPion (WELLBUTRIN XL) 300 MG 24 hr tablet  Attention deficit hyperactivity disorder (ADHD), predominantly inattentive type  Patient is stable on his current medication.  Continue Wellbutrin XL 300 mg daily, Lamictal 300 mg daily and REXULTI 2 mg daily.  [facility]  We will continue Ritalin 20 mg 4 times a day and he will call us when he is due for his next prescription.  Discussed medication side effects and  benefits.  Recommend to call us back if is any question or any concern.  Follow-up in 3 months.   Follow Up Instructions:     I discussed the assessment and treatment plan with the patient. The patient was provided an opportunity to ask questions and all were answered. The patient agreed with the plan and demonstrated an understanding of the instructions.   The patient was advised to call back or seek an in-person evaluation if the symptoms worsen or if the condition fails to improve as anticipated.    Collaboration of Care: Other provider involved in patient's care AEB notes are available in epic to review.  Patient/Guardian was advised Release of Information must be obtained prior to any record release in order to collaborate their care with an outside provider.  Patient/Guardian was advised if they have not already done so to contact the registration department to sign all necessary forms in order for [place] to release information regarding their care.   Consent: Patient/Guardian gives verbal consent for treatment and assignment of benefits for services provided during this visit. Patient/Guardian expressed understanding and agreed to proceed.     I provided 22 minutes of non face to face time during this encounter.  Note: This document was prepared by [employer] voice dictation technology and any errors that results from this process are unintentional.    Cleotis Nipper, MD 06/18/2022

## 2022-07-10 ENCOUNTER — Telehealth (HOSPITAL_COMMUNITY): Payer: Self-pay | Admitting: *Deleted

## 2022-07-10 DIAGNOSIS — F900 Attention-deficit hyperactivity disorder, predominantly inattentive type: Secondary | ICD-10-CM

## 2022-07-10 MED ORDER — METHYLPHENIDATE HCL 20 MG PO TABS: 20.0000 mg | ORAL_TABLET | Freq: Four times a day (QID) | ORAL | 0 refills | Status: DC

## 2022-07-10 NOTE — Telephone Encounter (Signed)
Send to [facility].

## 2022-07-10 NOTE — Telephone Encounter (Signed)
Pt called requesting a refill of the Methylphenidate 20 mg QID. [facility] [address] [facility]. Last e-scribed on 06/12/22. Pt f/u scheduled for 09/18/22. Please review.

## 2022-07-11 ENCOUNTER — Telehealth (HOSPITAL_COMMUNITY): Payer: Self-pay | Admitting: *Deleted

## 2022-07-11 NOTE — Telephone Encounter (Signed)
WRITER SUBMITTED PA FOR REXULTI 1 MG TABS #90 FOR A 90 DAY SUPPLY.   AWAITING FOR AN EXPEDITED DETERMINATION.   PA SUBMITTED TO [employer] VIA [employer] PA PORTAL.

## 2022-08-07 ENCOUNTER — Telehealth (HOSPITAL_COMMUNITY): Payer: Self-pay | Admitting: *Deleted

## 2022-08-07 DIAGNOSIS — F900 Attention-deficit hyperactivity disorder, predominantly inattentive type: Secondary | ICD-10-CM

## 2022-08-07 MED ORDER — METHYLPHENIDATE HCL 20 MG PO TABS: 20.0000 mg | ORAL_TABLET | Freq: Four times a day (QID) | ORAL | 0 refills | Status: DC

## 2022-08-07 NOTE — Telephone Encounter (Signed)
[facility] [address] 

## 2022-08-07 NOTE — Telephone Encounter (Signed)
Pt called requesting a refill of the Ritalin 20 mg QID. Last e-scribed 07/10/22. Pt has called several pharmacies he says to make sure they have medication in stock and the [facility] [address] [facility] quantity in stock currently. Pt last seen on 06/18/22 and has scheduled f/u on 09/18/22. Please review and advise.

## 2022-08-09 ENCOUNTER — Other Ambulatory Visit: Payer: Self-pay

## 2022-08-09 ENCOUNTER — Emergency Department (HOSPITAL_BASED_OUTPATIENT_CLINIC_OR_DEPARTMENT_OTHER)
Admission: EM | Admit: 2022-08-09 | Discharge: 2022-08-09 | Disposition: A | Discharge status: Home / Self Care | Payer: 59 | Attending: Emergency Medicine | Admitting: Emergency Medicine

## 2022-08-09 ENCOUNTER — Encounter (HOSPITAL_BASED_OUTPATIENT_CLINIC_OR_DEPARTMENT_OTHER): Payer: Self-pay

## 2022-08-09 DIAGNOSIS — K0889 Other specified disorders of teeth and supporting structures: Secondary | ICD-10-CM | POA: Insufficient documentation

## 2022-08-09 DIAGNOSIS — Z7982 Long term (current) use of aspirin: Secondary | ICD-10-CM | POA: Diagnosis not present

## 2022-08-09 DIAGNOSIS — E119 Type 2 diabetes mellitus without complications: Secondary | ICD-10-CM | POA: Insufficient documentation

## 2022-08-09 DIAGNOSIS — Z79899 Other long term (current) drug therapy: Secondary | ICD-10-CM | POA: Diagnosis not present

## 2022-08-09 DIAGNOSIS — Z794 Long term (current) use of insulin: Secondary | ICD-10-CM | POA: Insufficient documentation

## 2022-08-09 DIAGNOSIS — Z7984 Long term (current) use of oral hypoglycemic drugs: Secondary | ICD-10-CM | POA: Diagnosis not present

## 2022-08-09 DIAGNOSIS — I10 Essential (primary) hypertension: Secondary | ICD-10-CM | POA: Diagnosis not present

## 2022-08-09 MED ORDER — KETOROLAC TROMETHAMINE 60 MG/2ML IM SOLN
30.0000 mg | Freq: Once | INTRAMUSCULAR | Status: AC
  Administered 2022-08-09: 30 mg via INTRAMUSCULAR
  Filled 2022-08-09: qty 2

## 2022-08-09 MED ORDER — LIDOCAINE-EPINEPHRINE 2 %-1:100000 IJ SOLN
1.7000 mL | Freq: Once | INTRAMUSCULAR | Status: AC
  Administered 2022-08-09: 1.7 mL via INTRADERMAL
  Filled 2022-08-09: qty 1.7

## 2022-08-09 MED ORDER — NAPROXEN 375 MG PO TABS: 375.0000 mg | ORAL_TABLET | Freq: Two times a day (BID) | ORAL | 0 refills | Status: DC

## 2022-08-09 MED ORDER — CLINDAMYCIN HCL 300 MG PO CAPS: 300.0000 mg | ORAL_CAPSULE | Freq: Three times a day (TID) | ORAL | 0 refills | Status: AC

## 2022-08-09 NOTE — ED Provider Notes (Signed)
[place] EMERGENCY DEPARTMENT AT [facility] Provider Note  CSN: [id number] Arrival date & time: 08/09/22 0122  Chief Complaint(s) Dental Pain  HPI Dakota Vang is a 57 y.o. male     Dental Pain Location:  Lower Lower teeth location:  18/LL 2nd molar Quality:  Aching, constant and throbbing Severity:  Severe Onset quality:  Gradual Duration:  1 day Timing:  Constant Progression:  Worsening Chronicity:  New Relieved by:  Nothing Worsened by:  Touching, jaw movement and pressure Ineffective treatments:  Acetaminophen Associated symptoms: facial pain   Associated symptoms: no facial swelling, no fever, no gum swelling, no neck pain, no oral bleeding and no oral lesions   Risk factors: diabetes     Past Medical History Past Medical History:  Diagnosis Date   ADD (attention deficit disorder with hyperactivity)    Dr. Minta Balsam   Anxiety    Dr. Minta Balsam   Colon polyps 2008   Dr Russella Dar   COLONIC POLYPS, HX OF 11/20/2007   Qualifier: Diagnosis of  By: Alwyn Ren MD, Chrissie Noa   Last colonoscopy negative 2013 PMH of plyps X 2; Dr Russella Dar, GI     Depression    Diabetes [facility])    Diverticulitis    PMH of   Diverticulitis of jejunum with microperforation s/p lap resection [id number] 08/28/2012   Qualifier: Diagnosis of  By: Alwyn Ren MD, Chrissie Noa   6/1-3/14 [facility] : ? Jejunal perforation F/U CT recommended 08/24/12    Diverticulitis of sigmoid colon 06/24/2008   2005 by CT scan     Diverticulosis 2002   Dr Corinda Gubler   Duodenal diverticulum 08/28/2012   GERD (gastroesophageal reflux disease)    Hyperlipidemia    Hypertension    Pancolonic diverticulosis 09/09/2012   Patient Active Problem List   Diagnosis Date Noted   Incisional hernia 08/02/2019   Bilateral inguinal hernia (BIH) 08/02/2019   Diverticulitis of large intestine without perforation or abscess without bleeding    Depression with anxiety 04/14/2016   Diverticulitis 04/14/2016    Gastroesophageal reflux disease 10/23/2015   Hypertensive disorder 10/23/2015   Diverticulitis of colon with perforation 03/29/2013   Pancolonic diverticulosis 09/09/2012   Diverticulitis of jejunum with microperforation s/p lap resection [id number] 08/28/2012   Anxiety state 04/12/2009   Attention deficit disorder 11/20/2007   COLONIC POLYPS, HX OF 11/20/2007   Hyperlipidemia 06/12/2007   Essential hypertension 06/12/2007   HEMORRHOIDS, INTERNAL 11/27/2006   G E R D 09/19/2006   Home Medication(s) Prior to Admission medications   Medication Sig Start Date End Date Taking? Authorizing Provider  clindamycin (CLEOCIN) 300 MG capsule Take 1 capsule (300 mg total) by mouth 3 (three) times daily for 7 days. 08/09/22 08/16/22 Yes Trisha Morandi, Amadeo Garnet, MD  naproxen (NAPROSYN) 375 MG tablet Take 1 tablet (375 mg total) by mouth 2 (two) times daily. 08/09/22  Yes Arbor Cohen, Amadeo Garnet, MD  acetaminophen (TYLENOL) 500 MG tablet Take 500 mg by mouth every 6 (six) hours as needed.    [provider]  amLODipine (NORVASC) 5 MG tablet Take 5 mg by mouth daily.    [provider]  ARIPiprazole (ABILIFY) 5 MG tablet Take 0.5 tablets (2.5 mg total) by mouth daily. Patient not taking: Reported on 12/25/2021 03/28/21   Cleotis Nipper, MD  aspirin EC 81 MG tablet Take 81 mg by mouth daily.     [provider]  BD PEN NEEDLE NANO U/F 32G X 4 MM MISC SMARTSIG:injection Twice Daily 12/17/21  [provider]  brexpiprazole (REXULTI) 2 MG TABS tablet Take 1 tablet (2 mg total) by mouth daily. 06/18/22   Arfeen, Phillips Grout, MD  buPROPion (WELLBUTRIN XL) 300 MG 24 hr tablet Take 1 tablet (300 mg total) by mouth daily. 06/18/22   Arfeen, Phillips Grout, MD  ciprofloxacin (CIPRO) 500 MG tablet Take 1 tablet (500 mg total) by mouth 2 (two) times daily. 05/16/22   Meryl Dare, MD  clindamycin (CLEOCIN T) 1 % external solution Apply 1 application topically as needed (for breakouts on neck).      [provider]  clotrimazole (LOTRIMIN) 1 % external solution 3 (three) times daily. Patient not taking: Reported on 12/12/2021 09/25/21   [provider]  fluticasone (FLONASE) 50 MCG/ACT nasal spray Place 2 sprays into both nostrils daily as needed (seasonal allergies.).     [provider]  JARDIANCE 25 MG TABS tablet Take 25 mg by mouth daily. 11/06/21   [provider]  lamoTRIgine (LAMICTAL) 150 MG tablet Take 1 tablet (150 mg total) by mouth 2 (two) times daily. 06/18/22   Arfeen, Phillips Grout, MD  methylphenidate (RITALIN) 20 MG tablet Take 1 tablet (20 mg total) by mouth 4 (four) times daily. 08/07/22   Arfeen, Phillips Grout, MD  metoprolol succinate (TOPROL-XL) 50 MG 24 hr tablet Take 50 mg by mouth daily. 11/26/21   [provider]  metoprolol tartrate (LOPRESSOR) 50 MG tablet Take 50 mg by mouth 2 (two) times daily. Patient not taking: Reported on 01/02/2022 10/24/21   [provider]  metroNIDAZOLE (FLAGYL) 500 MG tablet Take 1 tablet (500 mg total) by mouth 2 (two) times daily. 05/16/22   Meryl Dare, MD  omeprazole (PRILOSEC) 20 MG capsule Take 20 mg by mouth daily before breakfast.     [provider]  OZEMPIC, 0.25 OR 0.5 MG/DOSE, 2 MG/3ML SOPN Inject into the skin. Patient not taking: Reported on 01/02/2022 11/26/21   [provider]  OZEMPIC, 1 MG/DOSE, 4 MG/3ML SOPN Inject 1 mg into the skin once a week. 12/23/21   [provider]  rosuvastatin (CRESTOR) 10 MG tablet Take 1 tablet (10 mg total) by mouth daily. 07/27/15   Sheliah Hatch, MD  TRUE METRIX BLOOD GLUCOSE TEST test strip 1 each 2 (two) times daily. 10/24/21   [provider]  TRUEplus Lancets 30G MISC  10/24/21   [provider]  metFORMIN (GLUCOPHAGE) 500 MG tablet Take 500 mg by mouth daily. 12/13/20   Georgann Housekeeper, MD                                                                                                                                     Allergies Metformin and related, Morphine and codeine, and Penicillins  Review of Systems Review of Systems  Constitutional:  Negative for fever.  HENT:  Negative for facial swelling and mouth sores.   Musculoskeletal:  Negative for neck pain.   As noted in HPI  Physical Exam Vital Signs  I have reviewed the triage vital signs BP (!) 164/101   Pulse 74   Temp 97.7 F (36.5 C) (Oral)   Resp 18   Ht 6\' 1"  (1.854 m)   Wt 97.5 kg   SpO2 100%   BMI 28.37 kg/m   Physical Exam Vitals reviewed.  Constitutional:      General: He is not in acute distress.    Appearance: He is well-developed. He is not diaphoretic.  HENT:     Head: Normocephalic and atraumatic.     Right Ear: External ear normal.     Left Ear: External ear normal.     Nose: Nose normal.     Mouth/Throat:     Mouth: Mucous membranes are moist. No lacerations, oral lesions or angioedema.     Dentition: Does not have dentures. Dental tenderness present. No dental caries, dental abscesses or gum lesions.   Eyes:     General: No scleral icterus.    Conjunctiva/sclera: Conjunctivae normal.  Neck:     Trachea: Phonation normal.  Cardiovascular:     Rate and Rhythm: Normal rate and regular rhythm.  Pulmonary:     Effort: Pulmonary effort is normal. No respiratory distress.     Breath sounds: No stridor.  Abdominal:     General: There is no distension.  Musculoskeletal:        General: Normal range of motion.     Cervical back: Normal range of motion.  Neurological:     Mental Status: He is alert and oriented to person, place, and time.  Psychiatric:        Behavior: Behavior normal.     ED Results and Treatments Labs (all labs ordered are listed, but only abnormal results are displayed) Labs Reviewed - No data to display                                                                                                                       EKG  EKG Interpretation  Date/Time:     Ventricular Rate:    PR Interval:    QRS Duration:   QT Interval:    QTC Calculation:   R Axis:     Text Interpretation:         Radiology No results found.  Medications Ordered in ED Medications  lidocaine-EPINEPHrine (XYLOCAINE W/EPI) 2 %-1:100000 (with pres) injection 1.7 mL (has no administration in time range)  ketorolac (TORADOL) injection 30 mg (has no administration in time range)   Procedures Dental Block  Date/Time: 08/09/2022 1:38 AM  Performed by: Nira Conn, MD Authorized by: Nira Conn, MD   Consent:    Consent obtained:  Verbal   Consent given by:  Patient   Risks discussed:  Pain   Alternatives discussed:  No treatment and delayed treatment Universal protocol:    Procedure explained and questions answered to patient or proxy's satisfaction:  yes     Immediately prior to procedure, a time out was called: yes     Patient identity confirmed:  Verbally with patient Indications:    Indications: dental pain   Location:    Block type:  Inferior alveolar   Laterality:  Left Procedure details:    Needle gauge:  27 G   Anesthetic injected:  Lidocaine 2% WITH epi   Injection procedure:  Anatomic landmarks identified, introduced needle, incremental injection, anatomic landmarks palpated and negative aspiration for blood Post-procedure details:    Outcome:  Pain relieved   Procedure completion:  Tolerated   (including critical care time) Medical Decision Making / ED Course   Medical Decision Making   Dental pain.  No obvious abscess/deep tissue infection. No dental fracture.  Pain improved with dental block. Given Toradol IM as well. Clinda for possible pulpitis Dentist f/u recommended    Final Clinical Impression(s) / ED Diagnoses Final diagnoses:  Pain, dental   The patient appears reasonably screened and/or stabilized for discharge and I doubt any other medical condition or other [facility] requiring further screening,  evaluation, or treatment in the ED at this time. I have discussed the findings, Dx and Tx plan with the patient/family who expressed understanding and agree(s) with the plan. Discharge instructions discussed at length. The patient/family was given strict return precautions who verbalized understanding of the instructions. No further questions at time of discharge.  Disposition: Discharge  Condition: Good  ED Discharge Orders          Ordered    clindamycin (CLEOCIN) 300 MG capsule  3 times daily        08/09/22 0141    naproxen (NAPROSYN) 375 MG tablet  2 times daily        08/09/22 0141             Follow Up: Dentist  Call  to schedule an appointment for close follow up    This chart was dictated using voice recognition software.  Despite best efforts to proofread,  errors can occur which can change the documentation meaning.    Nira Conn, MD 08/09/22 [phone]

## 2022-08-09 NOTE — ED Triage Notes (Signed)
Pt POV from home reporting L upper toothache/jaw pain that started this morning. Pt endorses regular dental visits, denies any known cavities. NAD noted in triage

## 2022-08-09 NOTE — ED Notes (Signed)
Reviewed AVS with patient, patient expressed understanding of directions, denies further questions at this time. 

## 2022-08-12 ENCOUNTER — Other Ambulatory Visit (HOSPITAL_COMMUNITY): Payer: Self-pay | Admitting: Psychiatry

## 2022-08-12 DIAGNOSIS — F411 Generalized anxiety disorder: Secondary | ICD-10-CM

## 2022-08-12 DIAGNOSIS — F330 Major depressive disorder, recurrent, mild: Secondary | ICD-10-CM

## 2022-08-24 ENCOUNTER — Other Ambulatory Visit (HOSPITAL_COMMUNITY): Payer: Self-pay | Admitting: Psychiatry

## 2022-08-24 DIAGNOSIS — F330 Major depressive disorder, recurrent, mild: Secondary | ICD-10-CM

## 2022-08-30 ENCOUNTER — Other Ambulatory Visit (HOSPITAL_COMMUNITY): Payer: Self-pay | Admitting: Psychiatry

## 2022-08-30 DIAGNOSIS — F330 Major depressive disorder, recurrent, mild: Secondary | ICD-10-CM

## 2022-09-02 ENCOUNTER — Other Ambulatory Visit (HOSPITAL_COMMUNITY): Payer: Self-pay | Admitting: Psychiatry

## 2022-09-02 ENCOUNTER — Telehealth (HOSPITAL_COMMUNITY): Payer: Self-pay

## 2022-09-02 DIAGNOSIS — F900 Attention-deficit hyperactivity disorder, predominantly inattentive type: Secondary | ICD-10-CM

## 2022-09-02 MED ORDER — METHYLPHENIDATE HCL 20 MG PO TABS: 20.0000 mg | ORAL_TABLET | Freq: Four times a day (QID) | ORAL | 0 refills | Status: DC

## 2022-09-02 NOTE — Telephone Encounter (Signed)
This is an Arfeen patient - Patient is calling for a refill on his Methylphenidate, he is early, and he is aware of that but he is leaving the state on vacation Friday and does not want to try and find a pharmacy out of state. Patient was last seen on 4/9 and has a follow up on 7/10. Patient would like [facility] [place]

## 2022-09-17 ENCOUNTER — Telehealth (HOSPITAL_COMMUNITY): Payer: 59 | Admitting: Psychiatry

## 2022-09-18 ENCOUNTER — Encounter (HOSPITAL_COMMUNITY): Payer: Self-pay | Admitting: Psychiatry

## 2022-09-18 ENCOUNTER — Telehealth (HOSPITAL_BASED_OUTPATIENT_CLINIC_OR_DEPARTMENT_OTHER): Payer: 59 | Admitting: Psychiatry

## 2022-09-18 VITALS — Wt 210.0 lb

## 2022-09-18 DIAGNOSIS — F411 Generalized anxiety disorder: Secondary | ICD-10-CM

## 2022-09-18 DIAGNOSIS — F330 Major depressive disorder, recurrent, mild: Secondary | ICD-10-CM

## 2022-09-18 DIAGNOSIS — F900 Attention-deficit hyperactivity disorder, predominantly inattentive type: Secondary | ICD-10-CM | POA: Diagnosis not present

## 2022-09-18 MED ORDER — BREXPIPRAZOLE 2 MG PO TABS
2.0000 mg | ORAL_TABLET | Freq: Every day | ORAL | 0 refills | Status: DC
Start: 2022-09-18 — End: 2022-12-18

## 2022-09-18 MED ORDER — BUPROPION HCL ER (XL) 300 MG PO TB24: 300.0000 mg | ORAL_TABLET | Freq: Every day | ORAL | 0 refills | Status: DC

## 2022-09-18 MED ORDER — LAMOTRIGINE 150 MG PO TABS
150.0000 mg | ORAL_TABLET | Freq: Two times a day (BID) | ORAL | 0 refills | Status: DC
Start: 2022-09-18 — End: 2022-12-18

## 2022-09-18 NOTE — Progress Notes (Signed)
[facility] MD Virtual Progress Note   Patient Location: In Car Provider Location: Home Office  I connect with patient by telephone and verified that I am speaking with correct person by using two identifiers. I discussed the limitations of evaluation and management by telemedicine and the availability of in person appointments. I also discussed with the patient that there may be a patient responsible charge related to this service. The patient expressed understanding and agreed to proceed.  Dakota Vang [id number] 57 y.o.  09/18/2022 2:06 PM  History of Present Illness:  Patient is evaluated by phone session.  He recently had a 9-day trip for vacation.  Patient told his boat broke down but he was still able to enjoy time at [facility] and able to do fishing.  Overall feels things are going very well.  Recently had a visit with his primary care and he was pleased that hemoglobin A1c was 5.4.  His BUN 40 and creatinine 0.96.  He reported depression is a stable and denies any feeling of hopelessness or worthlessness.  He is sleeping good.  He is taking Ozempic and Jardiance which is keeping his blood sugars stable.  He has no tremors, shakes or any EPS.  Denies any suicidal thoughts or homicidal thoughts.  Denies any panic attack.  Like to keep his current medication.  He lost a few pounds and is happy about it.  His energy level is good.  He is able to do multitasking and his attention concentration is good.  Past Psychiatric History: No h/o inpatient treatment or suicidal attempt.  Tried Prozac for many years until it stopped working.  We tried Cymbalta, Klonopin, BuSpar and Lexapro but that did not help.  History of ADD.  No history of paranoia, hallucination or psychosis.  Abilify helped but weight gain and increased blood sugar      Outpatient Encounter Medications as of 09/18/2022  Medication Sig   acetaminophen (TYLENOL) 500 MG tablet Take 500 mg by mouth every 6 (six) hours as  needed.   amLODipine (NORVASC) 5 MG tablet Take 5 mg by mouth daily.   ARIPiprazole (ABILIFY) 5 MG tablet Take 0.5 tablets (2.5 mg total) by mouth daily. (Patient not taking: Reported on 12/25/2021)   aspirin EC 81 MG tablet Take 81 mg by mouth daily.    BD PEN NEEDLE NANO U/F 32G X 4 MM MISC SMARTSIG:injection Twice Daily   brexpiprazole (REXULTI) 2 MG TABS tablet Take 1 tablet (2 mg total) by mouth daily.   buPROPion (WELLBUTRIN XL) 300 MG 24 hr tablet Take 1 tablet (300 mg total) by mouth daily.   ciprofloxacin (CIPRO) 500 MG tablet Take 1 tablet (500 mg total) by mouth 2 (two) times daily.   clindamycin (CLEOCIN T) 1 % external solution Apply 1 application topically as needed (for breakouts on neck).    clotrimazole (LOTRIMIN) 1 % external solution 3 (three) times daily. (Patient not taking: Reported on 12/12/2021)   fluticasone (FLONASE) 50 MCG/ACT nasal spray Place 2 sprays into both nostrils daily as needed (seasonal allergies.).    JARDIANCE 25 MG TABS tablet Take 25 mg by mouth daily.   lamoTRIgine (LAMICTAL) 150 MG tablet Take 1 tablet (150 mg total) by mouth 2 (two) times daily.   methylphenidate (RITALIN) 20 MG tablet Take 1 tablet (20 mg total) by mouth 4 (four) times daily.   metoprolol succinate (TOPROL-XL) 50 MG 24 hr tablet Take 50 mg by mouth daily.   metoprolol tartrate (LOPRESSOR) 50 MG  tablet Take 50 mg by mouth 2 (two) times daily. (Patient not taking: Reported on 01/02/2022)   metroNIDAZOLE (FLAGYL) 500 MG tablet Take 1 tablet (500 mg total) by mouth 2 (two) times daily.   naproxen (NAPROSYN) 375 MG tablet Take 1 tablet (375 mg total) by mouth 2 (two) times daily.   omeprazole (PRILOSEC) 20 MG capsule Take 20 mg by mouth daily before breakfast.    OZEMPIC, 0.25 OR 0.5 MG/DOSE, 2 MG/3ML SOPN Inject into the skin. (Patient not taking: Reported on 01/02/2022)   OZEMPIC, 1 MG/DOSE, 4 MG/3ML SOPN Inject 1 mg into the skin once a week.   rosuvastatin (CRESTOR) 10 MG tablet Take 1  tablet (10 mg total) by mouth daily.   TRUE METRIX BLOOD GLUCOSE TEST test strip 1 each 2 (two) times daily.   TRUEplus Lancets 30G MISC    [DISCONTINUED] metFORMIN (GLUCOPHAGE) 500 MG tablet Take 500 mg by mouth daily.   Facility-Administered Encounter Medications as of 09/18/2022  Medication   0.9 %  sodium chloride infusion    No results found for this or any previous visit (from the past 2160 hour(s)).   Psychiatric Specialty Exam: Physical Exam  Review of Systems  Weight 210 lb (95.3 kg).There is no height or weight on file to calculate BMI.  General Appearance: NA  Eye Contact:  NA  Speech:  Slow  Volume:  Normal  Mood:  Euthymic  Affect:  NA  Thought Process:  Goal Directed  Orientation:  Full (Time, Place, and Person)  Thought Content:  Logical  Suicidal Thoughts:  No  Homicidal Thoughts:  No  Memory:  Immediate;   Good Recent;   Good Remote;   Good  Judgement:  Good  Insight:  Good  Psychomotor Activity:  Normal  Concentration:  Concentration: Good and Attention Span: Good  Recall:  Good  Fund of Knowledge:  Good  Language:  Good  Akathisia:  No  Handed:  Right  AIMS (if indicated):     Assets:  Communication Skills Desire for Improvement Housing Resilience Social Support Talents/Skills Transportation  ADL's:  Intact  Cognition:  WNL  Sleep:  ok     Assessment/Plan: Major depressive disorder, recurrent episode, mild (HCC) - Plan: lamoTRIgine (LAMICTAL) 150 MG tablet, brexpiprazole (REXULTI) 2 MG TABS tablet, buPROPion (WELLBUTRIN XL) 300 MG 24 hr tablet  Attention deficit hyperactivity disorder (ADHD), predominantly inattentive type  Generalized anxiety disorder - Plan: buPROPion (WELLBUTRIN XL) 300 MG 24 hr tablet  Reviewed blood work results.  Recently stopped primary care.  Hemoglobin A1c 5.4, BUN 40 and creatinine 1.96.  He has no rash, itching, tremors or shakes.  Continue REXULTI 2 mg daily, Wellbutrin XL 300 mg daily and Lamictal 300 mg  daily.  Patient like to have these medication sent to [facility].  He is taking Ritalin 20 mg 4 times a day which he likes to get from local pharmacy.  He will call us for his next refill.  Recommended to call us back if is any question or any concern.  Follow-up in 3 months.   Follow Up Instructions:     I discussed the assessment and treatment plan with the patient. The patient was provided an opportunity to ask questions and all were answered. The patient agreed with the plan and demonstrated an understanding of the instructions.   The patient was advised to call back or seek an in-person evaluation if the symptoms worsen or if the condition fails to improve as anticipated.  Collaboration of Care: Other provider involved in patient's care AEB notes are available in epic to review.  Patient/Guardian was advised Release of Information must be obtained prior to any record release in order to collaborate their care with an outside provider. Patient/Guardian was advised if they have not already done so to contact the registration department to sign all necessary forms in order for [place] to release information regarding their care.   Consent: Patient/Guardian gives verbal consent for treatment and assignment of benefits for services provided during this visit. Patient/Guardian expressed understanding and agreed to proceed.     I provided 20 minutes of non face to face time during this encounter.  Note: This document was prepared by [employer] voice dictation technology and any errors that results from this process are unintentional.    Cleotis Nipper, MD 09/18/2022

## 2022-10-02 ENCOUNTER — Telehealth (HOSPITAL_COMMUNITY): Payer: Self-pay

## 2022-10-02 DIAGNOSIS — F900 Attention-deficit hyperactivity disorder, predominantly inattentive type: Secondary | ICD-10-CM

## 2022-10-02 MED ORDER — METHYLPHENIDATE HCL 20 MG PO TABS: 20.0000 mg | ORAL_TABLET | Freq: Four times a day (QID) | ORAL | 0 refills | Status: DC

## 2022-10-02 NOTE — Telephone Encounter (Signed)
Patient is calling for a refill on his Methylphenidate 20 mg 1 po 4x daily. Patient called around and found a pharmacy that had it in stock. [facility] on [address], I have updated the pharmacy in the chart. Medication last sent on 09/02/2022

## 2022-10-02 NOTE — Telephone Encounter (Signed)
Done

## 2022-10-30 ENCOUNTER — Other Ambulatory Visit (HOSPITAL_COMMUNITY): Payer: Self-pay | Admitting: Psychiatry

## 2022-10-30 ENCOUNTER — Telehealth (HOSPITAL_COMMUNITY): Payer: Self-pay | Admitting: *Deleted

## 2022-10-30 DIAGNOSIS — F900 Attention-deficit hyperactivity disorder, predominantly inattentive type: Secondary | ICD-10-CM

## 2022-10-30 MED ORDER — METHYLPHENIDATE HCL 20 MG PO TABS: 20.0000 mg | ORAL_TABLET | Freq: Four times a day (QID) | ORAL | 0 refills | Status: DC

## 2022-10-30 NOTE — Telephone Encounter (Signed)
Oh!. Yes.

## 2022-10-30 NOTE — Telephone Encounter (Signed)
Pt called requesting refill of the Ritalin IR 10 mg QID. He said that he has found a pharmacy that has the med in stock is the [place] on [address], store [phone]. This pharmacy is in pt profile. Med last ordered on 10/02/22. Pt says he is flying out of state tomorrow and will be out of town past when he will run out of the Ritalin. Pt has a f/u scheduled on 12/18/22. Please review.

## 2022-10-30 NOTE — Telephone Encounter (Signed)
Signed     Pt called requesting refill of the Ritalin IR 10 mg QID. He said that he has found a pharmacy that has the med in stock is the [facility] on [address], store [phone]. This pharmacy is in pt profile. Med last ordered on 10/02/22. Pt says he is flying out of state tomorrow and will be out of town past when he will run out of the Ritalin. Pt has a f/u scheduled on 12/18/22. Please review. Pt of Dr. Lolly Mustache.

## 2022-10-30 NOTE — Telephone Encounter (Signed)
Sent. I didn't realize Dr. Lolly Mustache is off today

## 2022-11-15 ENCOUNTER — Other Ambulatory Visit (HOSPITAL_COMMUNITY): Payer: Self-pay | Admitting: Psychiatry

## 2022-11-15 DIAGNOSIS — F330 Major depressive disorder, recurrent, mild: Secondary | ICD-10-CM

## 2022-11-15 DIAGNOSIS — F411 Generalized anxiety disorder: Secondary | ICD-10-CM

## 2022-11-24 ENCOUNTER — Other Ambulatory Visit (HOSPITAL_COMMUNITY): Payer: Self-pay | Admitting: Psychiatry

## 2022-11-24 DIAGNOSIS — F330 Major depressive disorder, recurrent, mild: Secondary | ICD-10-CM

## 2022-11-26 ENCOUNTER — Other Ambulatory Visit (HOSPITAL_COMMUNITY): Payer: Self-pay | Admitting: Psychiatry

## 2022-11-26 DIAGNOSIS — F330 Major depressive disorder, recurrent, mild: Secondary | ICD-10-CM

## 2022-11-27 ENCOUNTER — Telehealth (HOSPITAL_COMMUNITY): Payer: Self-pay

## 2022-11-27 ENCOUNTER — Other Ambulatory Visit (HOSPITAL_COMMUNITY): Payer: Self-pay

## 2022-11-27 DIAGNOSIS — F900 Attention-deficit hyperactivity disorder, predominantly inattentive type: Secondary | ICD-10-CM

## 2022-11-27 NOTE — Telephone Encounter (Signed)
Patient is calling for a refill on his Methylphenidate last filled on 10/30/22, patient has a follow up on  10/9 and was last seen on 7/10. Please review and advise, thank you

## 2022-11-28 MED ORDER — METHYLPHENIDATE HCL 20 MG PO TABS
20.0000 mg | ORAL_TABLET | Freq: Four times a day (QID) | ORAL | 0 refills | Status: DC
Start: 2022-11-28 — End: 2022-12-25

## 2022-11-28 NOTE — Addendum Note (Signed)
Addended by: Kathryne Sharper T on: 11/28/2022 09:44 AM   Modules accepted: Orders

## 2022-11-28 NOTE — Telephone Encounter (Signed)
Done

## 2022-12-18 ENCOUNTER — Telehealth (HOSPITAL_BASED_OUTPATIENT_CLINIC_OR_DEPARTMENT_OTHER): Payer: 59 | Admitting: Psychiatry

## 2022-12-18 ENCOUNTER — Encounter (HOSPITAL_COMMUNITY): Payer: Self-pay | Admitting: Psychiatry

## 2022-12-18 VITALS — Wt 210.0 lb

## 2022-12-18 DIAGNOSIS — F330 Major depressive disorder, recurrent, mild: Secondary | ICD-10-CM | POA: Diagnosis not present

## 2022-12-18 DIAGNOSIS — F900 Attention-deficit hyperactivity disorder, predominantly inattentive type: Secondary | ICD-10-CM

## 2022-12-18 DIAGNOSIS — F411 Generalized anxiety disorder: Secondary | ICD-10-CM | POA: Diagnosis not present

## 2022-12-18 MED ORDER — BUPROPION HCL ER (XL) 300 MG PO TB24
300.0000 mg | ORAL_TABLET | Freq: Every day | ORAL | 0 refills | Status: DC
Start: 2022-12-18 — End: 2023-03-24

## 2022-12-18 MED ORDER — BREXPIPRAZOLE 2 MG PO TABS: 2.0000 mg | ORAL_TABLET | Freq: Every day | ORAL | 0 refills | Status: DC

## 2022-12-18 MED ORDER — LAMOTRIGINE 150 MG PO TABS
150.0000 mg | ORAL_TABLET | Freq: Two times a day (BID) | ORAL | 0 refills | Status: DC
Start: 2022-12-18 — End: 2023-03-24

## 2022-12-18 NOTE — Progress Notes (Signed)
[facility] MD Virtual Progress Note   Patient Location: In Car Provider Location: Office  I connect with patient by telephone and verified that I am speaking with correct person by using two identifiers. I discussed the limitations of evaluation and management by telemedicine and the availability of in person appointments. I also discussed with the patient that there may be a patient responsible charge related to this service. The patient expressed understanding and agreed to proceed.  Dakota Vang [id number] 57 y.o.  12/18/2022 2:00 PM  History of Present Illness:  Patient is evaluated by phone session.  He has been doing well on his current medication.  Today he is going early from his job to help his wife who is having a procedure.  Patient told wife having neck pain and today she may receive injection.  Overall he feels things are going very well.  He had a recent BPH treatment he enjoyed time with the family.  His appetite is okay.  He denies any irritability, anger, mania, agitation.  He reported even though job is stressful but very manageable.  He has no tremors, shakes, rash or any itching.  His attention concentration is good and he is able to do multitasking and finish his task on time.  Denies any crying spells or any feeling of hopelessness or worthlessness.  He like to continue with his current medication.  Past Psychiatric History: No h/o inpatient treatment or suicidal attempt.  Tried Prozac for many years until it stopped working.  We tried Cymbalta, Klonopin, BuSpar and Lexapro but that did not help.  History of ADD.  No history of paranoia, hallucination or psychosis.  Abilify helped but weight gain and increased blood sugar    Outpatient Encounter Medications as of 12/18/2022  Medication Sig   acetaminophen (TYLENOL) 500 MG tablet Take 500 mg by mouth every 6 (six) hours as needed.   amLODipine (NORVASC) 5 MG tablet Take 5 mg by mouth daily.   aspirin EC  81 MG tablet Take 81 mg by mouth daily.    BD PEN NEEDLE NANO U/F 32G X 4 MM MISC SMARTSIG:injection Twice Daily   brexpiprazole (REXULTI) 2 MG TABS tablet Take 1 tablet (2 mg total) by mouth daily.   buPROPion (WELLBUTRIN XL) 300 MG 24 hr tablet Take 1 tablet (300 mg total) by mouth daily.   ciprofloxacin (CIPRO) 500 MG tablet Take 1 tablet (500 mg total) by mouth 2 (two) times daily.   clindamycin (CLEOCIN T) 1 % external solution Apply 1 application topically as needed (for breakouts on neck).    clotrimazole (LOTRIMIN) 1 % external solution 3 (three) times daily. (Patient not taking: Reported on 12/12/2021)   fluticasone (FLONASE) 50 MCG/ACT nasal spray Place 2 sprays into both nostrils daily as needed (seasonal allergies.).    JARDIANCE 25 MG TABS tablet Take 25 mg by mouth daily.   lamoTRIgine (LAMICTAL) 150 MG tablet Take 1 tablet (150 mg total) by mouth 2 (two) times daily.   methylphenidate (RITALIN) 20 MG tablet Take 1 tablet (20 mg total) by mouth 4 (four) times daily.   metoprolol succinate (TOPROL-XL) 50 MG 24 hr tablet Take 50 mg by mouth daily.   metoprolol tartrate (LOPRESSOR) 50 MG tablet Take 50 mg by mouth 2 (two) times daily. (Patient not taking: Reported on 09/18/2022)   metroNIDAZOLE (FLAGYL) 500 MG tablet Take 1 tablet (500 mg total) by mouth 2 (two) times daily.   naproxen (NAPROSYN) 375 MG tablet Take 1 tablet (  375 mg total) by mouth 2 (two) times daily.   omeprazole (PRILOSEC) 20 MG capsule Take 20 mg by mouth daily before breakfast.    OZEMPIC, 0.25 OR 0.5 MG/DOSE, 2 MG/3ML SOPN Inject into the skin. (Patient not taking: Reported on 01/02/2022)   OZEMPIC, 1 MG/DOSE, 4 MG/3ML SOPN Inject 1 mg into the skin once a week.   rosuvastatin (CRESTOR) 10 MG tablet Take 1 tablet (10 mg total) by mouth daily.   TRUE METRIX BLOOD GLUCOSE TEST test strip 1 each 2 (two) times daily.   TRUEplus Lancets 30G MISC    [DISCONTINUED] metFORMIN (GLUCOPHAGE) 500 MG tablet Take 500 mg by mouth  daily.   Facility-Administered Encounter Medications as of 12/18/2022  Medication   0.9 %  sodium chloride infusion    No results found for this or any previous visit (from the past 2160 hour(s)).   Psychiatric Specialty Exam: Physical Exam  Review of Systems  Weight 210 lb (95.3 kg).There is no height or weight on file to calculate BMI.  General Appearance: NA  Eye Contact:  NA  Speech:  Normal Rate  Volume:  Normal  Mood:  Euthymic  Affect:  Appropriate  Thought Process:  Goal Directed  Orientation:  Full (Time, Place, and Person)  Thought Content:  Logical  Suicidal Thoughts:  No  Homicidal Thoughts:  No  Memory:  Immediate;   Good Recent;   Good Remote;   Good  Judgement:  Good  Insight:  Present  Psychomotor Activity:  Normal  Concentration:  Concentration: Good and Attention Span: Good  Recall:  Good  Fund of Knowledge:  Good  Language:  Good  Akathisia:  No  Handed:  Right  AIMS (if indicated):     Assets:  Communication Skills Desire for Improvement Financial Resources/Insurance Housing Resilience Social Support Talents/Skills Transportation  ADL's:  Intact  Cognition:  WNL  Sleep:  ok     Assessment/Plan: Major depressive disorder, recurrent episode, mild (HCC) - Plan: buPROPion (WELLBUTRIN XL) 300 MG 24 hr tablet, lamoTRIgine (LAMICTAL) 150 MG tablet, brexpiprazole (REXULTI) 2 MG TABS tablet  Generalized anxiety disorder - Plan: buPROPion (WELLBUTRIN XL) 300 MG 24 hr tablet  Attention deficit hyperactivity disorder (ADHD), predominantly inattentive type  Patient is stable on his current medication.  He has appointment coming up soon with his primary care for blood work including hemoglobin A1c.  Last hemoglobin A1c 5.4.  Creatinine 1.96 and BUN 40.  Recommend to have his blood work results faxed to [place].  Continue REXULTI 2 mg daily, Wellbutrin XL 300 mg daily and Lamictal 300 mg daily.  Patient [facility]  He  will call us when he need the Ritalin 20 mg which he takes 4 times a day.  Discussed medication side effects and benefits.  Recommend to call us back with any question or any concern.  Follow-up in 3 months   Follow Up Instructions:     I discussed the assessment and treatment plan with the patient. The patient was provided an opportunity to ask questions and all were answered. The patient agreed with the plan and demonstrated an understanding of the instructions.   The patient was advised to call back or seek an in-person evaluation if the symptoms worsen or if the condition fails to improve as anticipated.    Collaboration of Care: Other provider involved in patient's care AEB notes are available in epic to review  Patient/Guardian was advised Release of Information must  be obtained prior to any record release in order to collaborate their care with an outside provider. Patient/Guardian was advised if they have not already done so to contact the registration department to sign all necessary forms in order for [place] to release information regarding their care.   Consent: Patient/Guardian gives verbal consent for treatment and assignment of benefits for services provided during this visit. Patient/Guardian expressed understanding and agreed to proceed.     I provided 18 minutes of non face to face time during this encounter.  Note: This document was prepared by [employer] voice dictation technology and any errors that results from this process are unintentional.    Cleotis Nipper, MD 12/18/2022

## 2022-12-25 ENCOUNTER — Telehealth (HOSPITAL_COMMUNITY): Payer: Self-pay | Admitting: *Deleted

## 2022-12-25 DIAGNOSIS — F900 Attention-deficit hyperactivity disorder, predominantly inattentive type: Secondary | ICD-10-CM

## 2022-12-25 MED ORDER — METHYLPHENIDATE HCL 20 MG PO TABS
20.0000 mg | ORAL_TABLET | Freq: Four times a day (QID) | ORAL | 0 refills | Status: DC
Start: 2022-12-25 — End: 2023-01-22

## 2022-12-25 NOTE — Telephone Encounter (Signed)
Done

## 2022-12-25 NOTE — Telephone Encounter (Signed)
Pt called requesting a refill of the Ritalin 20 mg qid. Last e-scribed 11/28/22. Pt was last seen on 12/18/22 and has f/u scheduled for 03/23/22. Pt regular pharmacy is out of stock but pt has found correct quantity and dose @ the [facility] in [place], which has been added to pt profile. Please review.

## 2023-01-22 ENCOUNTER — Telehealth (HOSPITAL_COMMUNITY): Payer: Self-pay | Admitting: *Deleted

## 2023-01-22 DIAGNOSIS — F900 Attention-deficit hyperactivity disorder, predominantly inattentive type: Secondary | ICD-10-CM

## 2023-01-22 MED ORDER — METHYLPHENIDATE HCL 20 MG PO TABS
20.0000 mg | ORAL_TABLET | Freq: Four times a day (QID) | ORAL | 0 refills | Status: DC
Start: 2023-01-22 — End: 2023-01-29

## 2023-01-22 NOTE — Telephone Encounter (Signed)
Pt called requesting refill of the generic Ritalin 20 mg [facility]ID [facility] [address] Which has been [facility] to [facility]. This pharmacy has the dose and the amount needed currently in stock. Medication last e-scribed on 12/25/22. Pt was seen on 12/18/22 and has f/u scheduled on 03/24/23. Please review.

## 2023-01-22 NOTE — Telephone Encounter (Signed)
Done

## 2023-01-24 ENCOUNTER — Telehealth (HOSPITAL_COMMUNITY): Payer: Self-pay | Admitting: *Deleted

## 2023-01-24 NOTE — Telephone Encounter (Signed)
[facility] regarding refill of the generic Ritalin 20 mg QID. [facility] as last fill date shows as 12/28/22. Pt states he filled Rx on 12/25/22, the date previous script was filled. Pharmacy says his bottle shows 12/25/22. Pt issue is [place] and will not be able to fill until next week and needs medication for work. He's asking to be filled today. Please review and advise.

## 2023-01-26 NOTE — Telephone Encounter (Signed)
Cannot get early refill due to control substance.

## 2023-01-29 ENCOUNTER — Telehealth (HOSPITAL_COMMUNITY): Payer: Self-pay | Admitting: *Deleted

## 2023-01-29 DIAGNOSIS — F900 Attention-deficit hyperactivity disorder, predominantly inattentive type: Secondary | ICD-10-CM

## 2023-01-29 MED ORDER — METHYLPHENIDATE HCL 20 MG PO TABS
20.0000 mg | ORAL_TABLET | Freq: Four times a day (QID) | ORAL | 0 refills | Status: DC
Start: 2023-01-29 — End: 2023-02-26

## 2023-01-29 NOTE — Telephone Encounter (Signed)
Done

## 2023-01-29 NOTE — Telephone Encounter (Signed)
Pt called requesting generic Ritalin 20 mg QID please be sent to [facility] in [place] as the [facility] (last sent DNF 01/28/23) is now out of this dose and quantity. Pt has a f/u scheduled for 03/24/23. Please review.

## 2023-02-12 ENCOUNTER — Other Ambulatory Visit (HOSPITAL_COMMUNITY): Payer: Self-pay | Admitting: Psychiatry

## 2023-02-12 DIAGNOSIS — F330 Major depressive disorder, recurrent, mild: Secondary | ICD-10-CM

## 2023-02-12 DIAGNOSIS — F411 Generalized anxiety disorder: Secondary | ICD-10-CM

## 2023-02-23 ENCOUNTER — Other Ambulatory Visit (HOSPITAL_COMMUNITY): Payer: Self-pay | Admitting: Psychiatry

## 2023-02-23 DIAGNOSIS — F330 Major depressive disorder, recurrent, mild: Secondary | ICD-10-CM

## 2023-02-26 ENCOUNTER — Other Ambulatory Visit (HOSPITAL_COMMUNITY): Payer: Self-pay | Admitting: Psychiatry

## 2023-02-26 ENCOUNTER — Telehealth (HOSPITAL_COMMUNITY): Payer: Self-pay | Admitting: *Deleted

## 2023-02-26 DIAGNOSIS — F330 Major depressive disorder, recurrent, mild: Secondary | ICD-10-CM

## 2023-02-26 DIAGNOSIS — F900 Attention-deficit hyperactivity disorder, predominantly inattentive type: Secondary | ICD-10-CM

## 2023-02-26 DIAGNOSIS — F411 Generalized anxiety disorder: Secondary | ICD-10-CM

## 2023-02-26 MED ORDER — METHYLPHENIDATE HCL 20 MG PO TABS: 20.0000 mg | ORAL_TABLET | Freq: Four times a day (QID) | ORAL | 0 refills | Status: DC

## 2023-02-26 NOTE — Telephone Encounter (Signed)
Done

## 2023-02-26 NOTE — Telephone Encounter (Signed)
Pt called requesting refill of the generic Ritalin 20 mg taken QID. Last visit was on 12/18/22. This medication was last e-scribed on 01/29/23. Pt has an appointment scheduled for 03/24/23. Pt requesting script be sent to [facility] [phone] in [place].

## 2023-03-24 ENCOUNTER — Telehealth (HOSPITAL_COMMUNITY): Payer: 59 | Admitting: Psychiatry

## 2023-03-24 ENCOUNTER — Encounter (HOSPITAL_COMMUNITY): Payer: Self-pay | Admitting: Psychiatry

## 2023-03-24 VITALS — Wt 215.0 lb

## 2023-03-24 DIAGNOSIS — F411 Generalized anxiety disorder: Secondary | ICD-10-CM

## 2023-03-24 DIAGNOSIS — F900 Attention-deficit hyperactivity disorder, predominantly inattentive type: Secondary | ICD-10-CM | POA: Diagnosis not present

## 2023-03-24 DIAGNOSIS — F330 Major depressive disorder, recurrent, mild: Secondary | ICD-10-CM

## 2023-03-24 MED ORDER — BUPROPION HCL ER (XL) 300 MG PO TB24: 300.0000 mg | ORAL_TABLET | Freq: Every day | ORAL | 0 refills | Status: DC

## 2023-03-24 MED ORDER — BREXPIPRAZOLE 2 MG PO TABS: 2.0000 mg | ORAL_TABLET | Freq: Every day | ORAL | 0 refills | Status: DC

## 2023-03-24 MED ORDER — LAMOTRIGINE 150 MG PO TABS: 150.0000 mg | ORAL_TABLET | Freq: Two times a day (BID) | ORAL | 0 refills | Status: DC

## 2023-03-24 NOTE — Progress Notes (Signed)
  Health MD Virtual Progress Note   Patient Location: Home Provider Location: Home Office  I connect with patient by telephone and verified that I am speaking with correct person by using two identifiers. I discussed the limitations of evaluation and management by telemedicine and the availability of in person appointments. I also discussed with the patient that there may be a patient responsible charge related to this service. The patient expressed understanding and agreed to proceed.  Dakota Vang [id number] 58 y.o.  03/24/2023 10:11 AM  History of Present Illness:  Patient is evaluated by phone session.  He is not able to log in on his mind chart.  He reported things are going well.  He had a good Christmas and holidays.  His wife is also doing better who had a neck pain and received injection recently.  He reported his attention concentration is good.  His job is going well.  He denies any crying spells or any feeling of hopelessness or worthlessness.  He is taking all his medication and reported no tremors, shakes or any EPS.  He is able to do multitasking and time management at work with the help of stimulant.  He has no rash, itching tremors or shakes.  Recently had a visit with her primary care Dr. Hussain.  Patient told his labs are okay and his last hemoglobin A1c was 5.4.  His creatinine was 1.96 and he is going to have another blood work very soon.  He is trying to lose weight and drink a lot of water .  Does not want to change the medication since it is working well.  No  Past Psychiatric History: No h/o inpatient treatment or suicidal attempt.  Tried Prozac  for many years until it stopped working.  We tried Cymbalta , Klonopin , BuSpar  and Lexapro  but that did not help.  History of ADD.  No history of paranoia, hallucination or psychosis.  Abilify  helped but weight gain and increased blood sugar    Outpatient Encounter Medications as of 03/24/2023  Medication Sig    acetaminophen  (TYLENOL ) 500 MG tablet Take 500 mg by mouth every 6 (six) hours as needed.   amLODipine  (NORVASC ) 5 MG tablet Take 5 mg by mouth daily.   aspirin  EC 81 MG tablet Take 81 mg by mouth daily.    brexpiprazole  (REXULTI ) 2 MG TABS tablet Take 1 tablet (2 mg total) by mouth daily.   buPROPion  (WELLBUTRIN  XL) 300 MG 24 hr tablet Take 1 tablet (300 mg total) by mouth daily.   ciprofloxacin  (CIPRO ) 500 MG tablet Take 1 tablet (500 mg total) by mouth 2 (two) times daily.   clindamycin  (CLEOCIN  T) 1 % external solution Apply 1 application topically as needed (for breakouts on neck).    fluticasone  (FLONASE ) 50 MCG/ACT nasal spray Place 2 sprays into both nostrils daily as needed (seasonal allergies.).    JARDIANCE 25 MG TABS tablet Take 25 mg by mouth daily.   lamoTRIgine  (LAMICTAL ) 150 MG tablet Take 1 tablet (150 mg total) by mouth 2 (two) times daily.   methylphenidate  (RITALIN ) 20 MG tablet Take 1 tablet (20 mg total) by mouth 4 (four) times daily.   metoprolol  succinate (TOPROL -XL) 50 MG 24 hr tablet Take 50 mg by mouth daily.   metroNIDAZOLE  (FLAGYL ) 500 MG tablet Take 1 tablet (500 mg total) by mouth 2 (two) times daily.   naproxen  (NAPROSYN ) 375 MG tablet Take 1 tablet (375 mg total) by mouth 2 (two) times daily.   omeprazole  (PRILOSEC)  20 MG capsule Take 20 mg by mouth daily before breakfast.    OZEMPIC, 0.25 OR 0.5 MG/DOSE, 2 MG/3ML SOPN Inject into the skin. (Patient not taking: Reported on 01/02/2022)   OZEMPIC, 1 MG/DOSE, 4 MG/3ML SOPN Inject 1 mg into the skin once a week.   rosuvastatin  (CRESTOR ) 10 MG tablet Take 1 tablet (10 mg total) by mouth daily.   TRUE METRIX BLOOD GLUCOSE TEST test strip 1 each 2 (two) times daily.   TRUEplus Lancets 30G MISC    [DISCONTINUED] metFORMIN (GLUCOPHAGE) 500 MG tablet Take 500 mg by mouth daily.   Facility-Administered Encounter Medications as of 03/24/2023  Medication   0.9 %  sodium chloride  infusion    No results found for this or  any previous visit (from the past 2160 hours).   Psychiatric Specialty Exam: Physical Exam  Review of Systems  Weight 215 lb (97.5 kg).There is no height or weight on file to calculate BMI.  General Appearance: NA  Eye Contact:  NA  Speech:  Normal Rate  Volume:  Normal  Mood:  Euthymic  Affect:  Appropriate  Thought Process:  Goal Directed  Orientation:  Full (Time, Place, and Person)  Thought Content:  WDL  Suicidal Thoughts:  No  Homicidal Thoughts:  No  Memory:  Immediate;   Good Recent;   Good Remote;   Good  Judgement:  Intact  Insight:  Present  Psychomotor Activity:  Normal  Concentration:  Concentration: Good and Attention Span: Good  Recall:  Good  Fund of Knowledge:  Good  Language:  Good  Akathisia:  No  Handed:  Right  AIMS (if indicated):     Assets:  Communication Skills Desire for Improvement Housing Resilience Social Support Talents/Skills Transportation  ADL's:  Intact  Cognition:  WNL  Sleep:  ok     Assessment/Plan: Major depressive disorder, recurrent episode, mild (HCC) - Plan: brexpiprazole  (REXULTI ) 2 MG TABS tablet, buPROPion  (WELLBUTRIN  XL) 300 MG 24 hr tablet, lamoTRIgine  (LAMICTAL ) 150 MG tablet  Generalized anxiety disorder - Plan: buPROPion  (WELLBUTRIN  XL) 300 MG 24 hr tablet  Attention deficit hyperactivity disorder (ADHD), predominantly inattentive type  Patient is stable on his current medication.  Encouraged to have a blood work with his primary care as last creatinine was 1.96.  Continue Ritalin  20 mg 4 times a day, Rexulti  2 mg daily and Wellbutrin  XL 300 mg daily and Lamictal  300 mg daily.  He has no rash, itching tremors or shakes.  We discussed polypharmacy but patient does not want to reduce the dose of medication since it is working well.  Recommended to call us  back if is any question or any concern.  Follow-up in 3 months.   Follow Up Instructions:     I discussed the assessment and treatment plan with the patient. The  patient was provided an opportunity to ask questions and all were answered. The patient agreed with the plan and demonstrated an understanding of the instructions.   The patient was advised to call back or seek an in-person evaluation if the symptoms worsen or if the condition fails to improve as anticipated.    Collaboration of Care: Other provider involved in patient's care AEB notes are available in epic to review  Patient/Guardian was advised Release of Information must be obtained prior to any record release in order to collaborate their care with an outside provider. Patient/Guardian was advised if they have not already done so to contact the registration department to sign all necessary forms  in order for us  to release information regarding their care.   Consent: Patient/Guardian gives verbal consent for treatment and assignment of benefits for services provided during this visit. Patient/Guardian expressed understanding and agreed to proceed.     I provided 19 minutes of non face to face time during this encounter.  Note: This document was prepared by [employer] voice dictation technology and any errors that results from this process are unintentional.    Leni ONEIDA Client, MD 03/24/2023

## 2023-03-27 ENCOUNTER — Telehealth (HOSPITAL_COMMUNITY): Payer: Self-pay | Admitting: *Deleted

## 2023-03-27 DIAGNOSIS — F900 Attention-deficit hyperactivity disorder, predominantly inattentive type: Secondary | ICD-10-CM

## 2023-03-27 MED ORDER — METHYLPHENIDATE HCL 20 MG PO TABS: 20.0000 mg | ORAL_TABLET | Freq: Four times a day (QID) | ORAL | 0 refills | Status: DC

## 2023-03-27 NOTE — Telephone Encounter (Signed)
Done

## 2023-03-27 NOTE — Telephone Encounter (Signed)
Pt called requesting refill generic Ritalin 20 mg QID. Pt [facility]ies [facility] [place] [facility]. Pharmacy has been added to pt profile. Pt was last seen on 03/24/23 but no f/u was scheduled yet. Writer will advise pt. Last script was e-scribed on 02/26/23.

## 2023-04-25 ENCOUNTER — Telehealth (HOSPITAL_COMMUNITY): Payer: Self-pay

## 2023-04-25 ENCOUNTER — Other Ambulatory Visit (HOSPITAL_COMMUNITY): Payer: Self-pay

## 2023-04-25 DIAGNOSIS — F900 Attention-deficit hyperactivity disorder, predominantly inattentive type: Secondary | ICD-10-CM

## 2023-04-25 DIAGNOSIS — F330 Major depressive disorder, recurrent, mild: Secondary | ICD-10-CM

## 2023-04-25 MED ORDER — METHYLPHENIDATE HCL 20 MG PO TABS: 20.0000 mg | ORAL_TABLET | Freq: Four times a day (QID) | ORAL | 0 refills | Status: DC

## 2023-04-25 NOTE — Telephone Encounter (Signed)
Patient is calling this morning for a refill on his Methylphenidate, last filled 1/13. Patient is making a follow up appointment for April. Please review and advise, thank you

## 2023-04-25 NOTE — Telephone Encounter (Signed)
He is not due until February 18.  I have sent the prescription and he can pick up on his due date from [facility] in [place].

## 2023-05-18 ENCOUNTER — Other Ambulatory Visit (HOSPITAL_COMMUNITY): Payer: Self-pay | Admitting: Psychiatry

## 2023-05-18 DIAGNOSIS — F330 Major depressive disorder, recurrent, mild: Secondary | ICD-10-CM

## 2023-05-18 DIAGNOSIS — F411 Generalized anxiety disorder: Secondary | ICD-10-CM

## 2023-05-26 ENCOUNTER — Telehealth (HOSPITAL_COMMUNITY): Payer: Self-pay | Admitting: *Deleted

## 2023-05-26 DIAGNOSIS — F900 Attention-deficit hyperactivity disorder, predominantly inattentive type: Secondary | ICD-10-CM

## 2023-05-26 MED ORDER — METHYLPHENIDATE HCL 20 MG PO TABS: 20.0000 mg | ORAL_TABLET | Freq: Four times a day (QID) | ORAL | 0 refills | Status: DC

## 2023-05-26 NOTE — Telephone Encounter (Signed)
 Done

## 2023-05-26 NOTE — Telephone Encounter (Signed)
 Pt called requesting a refill of the Methylphenidate 20 mg QID. Last script was e-scribed on 04/25/23. Pt has a f/u scheduled for 06/23/23. Per pt please send to [facility] [phone] in [place]. Thank you.

## 2023-05-30 ENCOUNTER — Other Ambulatory Visit (HOSPITAL_COMMUNITY): Payer: Self-pay | Admitting: Psychiatry

## 2023-05-30 DIAGNOSIS — F330 Major depressive disorder, recurrent, mild: Secondary | ICD-10-CM

## 2023-06-16 ENCOUNTER — Telehealth (HOSPITAL_COMMUNITY): Payer: Self-pay | Admitting: *Deleted

## 2023-06-16 NOTE — Telephone Encounter (Signed)
 PA for Rexulti 2 mg tabs 1 every day submitted to [facility] via the CoverMYMeds portal. Awaiting determination.

## 2023-06-23 ENCOUNTER — Telehealth (HOSPITAL_COMMUNITY): Payer: 59 | Admitting: Psychiatry

## 2023-06-23 ENCOUNTER — Encounter (HOSPITAL_COMMUNITY): Payer: Self-pay | Admitting: Psychiatry

## 2023-06-23 DIAGNOSIS — F330 Major depressive disorder, recurrent, mild: Secondary | ICD-10-CM

## 2023-06-23 DIAGNOSIS — F900 Attention-deficit hyperactivity disorder, predominantly inattentive type: Secondary | ICD-10-CM | POA: Diagnosis not present

## 2023-06-23 DIAGNOSIS — F411 Generalized anxiety disorder: Secondary | ICD-10-CM

## 2023-06-23 MED ORDER — METHYLPHENIDATE HCL 20 MG PO TABS: 20.0000 mg | ORAL_TABLET | Freq: Four times a day (QID) | ORAL | 0 refills | Status: DC

## 2023-06-23 MED ORDER — LAMOTRIGINE 150 MG PO TABS: 150.0000 mg | ORAL_TABLET | Freq: Two times a day (BID) | ORAL | 0 refills | Status: DC

## 2023-06-23 MED ORDER — BUPROPION HCL ER (XL) 300 MG PO TB24: 300.0000 mg | ORAL_TABLET | Freq: Every day | ORAL | 0 refills | Status: DC

## 2023-06-23 MED ORDER — BREXPIPRAZOLE 2 MG PO TABS: 2.0000 mg | ORAL_TABLET | Freq: Every day | ORAL | 0 refills | Status: DC

## 2023-06-23 NOTE — Progress Notes (Signed)
 [facility] MD Virtual Progress Note   Patient Location: Home Provider Location: Home Office  I connect with patient by video and verified that I am speaking with correct person by using two identifiers. I discussed the limitations of evaluation and management by telemedicine and the availability of in person appointments. I also discussed with the patient that there may be a patient responsible charge related to this service. The patient expressed understanding and agreed to proceed.  Dakota Vang [id number] 58 y.o.  06/23/2023 8:40 AM  History of Present Illness:  Patient is evaluated by video session.  He reported things are going well.  His blood sugar is much better and his weight is stable.  He is on Ozempic and also taking Jardiance.  Denies any irritability, anger, mania, psychosis or any hallucination.  He is sleeping good.  He is very happy that his children's are doing very well.  He has 3 boys and 2 of them live on their own.  He reported job going very well and there has been no issues.  He has no tremors shakes or any EPS.  His attention concentration and multitasking is good.  His appetite is okay and his weight is stable.  He is taking Ritalin, Wellbutrin, Rexulti and Lamictal.  He has no rash, itching tremors or shakes.  Patient told he had a visit with his primary care and he was placed labs are much better.  He has another appointment coming up for checkup.  He hydrated himself as he has in the past kidney issues but now he told numbers are much better.  He denies any panic attack or getting overwhelmed.  He is excited about upcoming cruise trip to [place] in June and then [place].  He denies drinking or using any illegal substances.  Past Psychiatric History: No h/o inpatient treatment or suicidal attempt.  Tried Prozac for many years until it stopped working.  We tried Cymbalta, Klonopin, BuSpar and Lexapro but that  did not help.  History of ADD.  No history of paranoia, hallucination or psychosis.  Abilify helped but weight gain and increased blood sugar    Outpatient Encounter Medications as of 06/23/2023  Medication Sig   acetaminophen (TYLENOL) 500 MG tablet Take 500 mg by mouth every 6 (six) hours as needed.   amLODipine (NORVASC) 5 MG tablet Take 5 mg by mouth daily.   aspirin EC 81 MG tablet Take 81 mg by mouth daily.    brexpiprazole (REXULTI) 2 MG TABS tablet Take 1 tablet (2 mg total) by mouth daily.   buPROPion (WELLBUTRIN XL) 300 MG 24 hr tablet Take 1 tablet (300 mg total) by mouth daily.   ciprofloxacin (CIPRO) 500 MG tablet Take 1 tablet (500 mg total) by mouth 2 (two) times daily.   clindamycin (CLEOCIN T) 1 % external solution Apply 1 application topically as needed (for breakouts on neck).    fluticasone (FLONASE) 50 MCG/ACT nasal spray Place 2 sprays into both nostrils daily as needed (seasonal allergies.).    JARDIANCE 25 MG TABS tablet Take 25 mg by mouth daily.   lamoTRIgine (LAMICTAL) 150 MG tablet Take 1 tablet (150 mg total) by mouth 2 (two) times daily.   methylphenidate (RITALIN) 20 MG tablet Take 1 tablet (20 mg total) by mouth 4 (four) times daily.   metoprolol succinate (TOPROL-XL) 50 MG 24 hr tablet Take 50 mg by mouth daily.   metroNIDAZOLE (FLAGYL)  500 MG tablet Take 1 tablet (500 mg total) by mouth 2 (two) times daily.   naproxen (NAPROSYN) 375 MG tablet Take 1 tablet (375 mg total) by mouth 2 (two) times daily.   omeprazole (PRILOSEC) 20 MG capsule Take 20 mg by mouth daily before breakfast.    OZEMPIC, 0.25 OR 0.5 MG/DOSE, 2 MG/3ML SOPN Inject into the skin. (Patient not taking: Reported on 01/02/2022)   OZEMPIC, 1 MG/DOSE, 4 MG/3ML SOPN Inject 1 mg into the skin once a week.   rosuvastatin (CRESTOR) 10 MG tablet Take 1 tablet (10 mg total) by mouth daily.   TRUE METRIX BLOOD GLUCOSE TEST test strip 1 each 2 (two) times daily.   TRUEplus Lancets 30G MISC     [DISCONTINUED] metFORMIN (GLUCOPHAGE) 500 MG tablet Take 500 mg by mouth daily.   Facility-Administered Encounter Medications as of 06/23/2023  Medication   0.9 %  sodium chloride infusion    No results found for this or any previous visit (from the past 2160 hours).   Psychiatric Specialty Exam: Physical Exam  Review of Systems  Weight 215 lb (97.5 kg).There is no height or weight on file to calculate BMI.  General Appearance: Casual  Eye Contact:  Good  Speech:  Clear and Coherent  Volume:  Normal  Mood:  Euthymic  Affect:  Congruent  Thought Process:  Goal Directed  Orientation:  Full (Time, Place, and Person)  Thought Content:  Logical  Suicidal Thoughts:  No  Homicidal Thoughts:  No  Memory:  Immediate;   Good Recent;   Good Remote;   Good  Judgement:  Good  Insight:  Good  Psychomotor Activity:  Normal  Concentration:  Concentration: Good and Attention Span: Good  Recall:  Good  Fund of Knowledge:  Good  Language:  Good  Akathisia:  No  Handed:  Right  AIMS (if indicated):     Assets:  Communication Skills Desire for Improvement Financial Resources/Insurance Housing Social Support Talents/Skills Transportation  ADL's:  Intact  Cognition:  WNL  Sleep:  ok       08/30/2015   10:39 AM 07/26/2015    9:35 AM  Depression screen PHQ 2/9  Decreased Interest 0 0  Down, Depressed, Hopeless 0 0  PHQ - 2 Score 0 0    Assessment/Plan: Major depressive disorder, recurrent episode, mild (HCC) - Plan: brexpiprazole (REXULTI) 2 MG TABS tablet, buPROPion (WELLBUTRIN XL) 300 MG 24 hr tablet, lamoTRIgine (LAMICTAL) 150 MG tablet  Generalized anxiety disorder - Plan: buPROPion (WELLBUTRIN XL) 300 MG 24 hr tablet  Attention deficit hyperactivity disorder (ADHD), predominantly inattentive type - Plan: methylphenidate (RITALIN) 20 MG tablet  Patient is stable on his current medication.  Is getting Ritalin at his local pharmacy and rest of the medication goes [facility].  He has no major issues or concern from the medication.  We discussed polypharmacy but patient feels the current regimen is working very well and he is making progress at work.  Continue Wellbutrin XL 300 mg daily, Ritalin 20 mg 4 times a day, Rexulti 2 mg daily and Lamictal 300 mg daily.  Will consider reducing the Ritalin in the future if needed.  So far he is comfortable and no major issues.  Recommend to call us back with any question or any concern.  I encouraged to have his lab work faxed to [place] from his primary care.  Patient agreed with the plan.  Follow-up in 3 months   Follow Up Instructions:  I discussed the assessment and treatment plan with the patient. The patient was provided an opportunity to ask questions and all were answered. The patient agreed with the plan and demonstrated an understanding of the instructions.   The patient was advised to call back or seek an in-person evaluation if the symptoms worsen or if the condition fails to improve as anticipated.    Collaboration of Care: Other provider involved in patient's care AEB notes are available in epic to review  Patient/Guardian was advised Release of Information must be obtained prior to any record release in order to collaborate their care with an outside provider. Patient/Guardian was advised if they have not already done so to contact the registration department to sign all necessary forms in order for us  to release information regarding their care.   Consent: Patient/Guardian gives verbal consent for treatment and assignment of benefits for services provided during this visit. Patient/Guardian expressed understanding and agreed to proceed.     Total encounter time 21 minutes which includes face-to-face time, chart reviewed, care coordination, order entry and documentation during this encounter.   Note: This document was prepared by [employer] voice dictation technology and any errors that results from this process  are unintentional.    Arturo Late, MD 06/23/2023

## 2023-07-25 ENCOUNTER — Telehealth (HOSPITAL_COMMUNITY): Payer: Self-pay

## 2023-07-25 DIAGNOSIS — F900 Attention-deficit hyperactivity disorder, predominantly inattentive type: Secondary | ICD-10-CM

## 2023-07-25 MED ORDER — METHYLPHENIDATE HCL 20 MG PO TABS: 20.0000 mg | ORAL_TABLET | Freq: Four times a day (QID) | ORAL | 0 refills | Status: DC

## 2023-07-25 NOTE — Telephone Encounter (Signed)
 Patient is calling for a refill on Methylphenidate . He uses [facility] in [place]. Patient last got refill on 4/17 and has a follow up on 7/14. Patient would like to pick it up today if possible he is going out of the city for the weekend. Please review and advise, thank you

## 2023-07-25 NOTE — Telephone Encounter (Signed)
 Prescription sent to his pharmacy.

## 2023-08-17 ENCOUNTER — Other Ambulatory Visit (HOSPITAL_COMMUNITY): Payer: Self-pay | Admitting: Psychiatry

## 2023-08-17 DIAGNOSIS — F330 Major depressive disorder, recurrent, mild: Secondary | ICD-10-CM

## 2023-08-17 DIAGNOSIS — F411 Generalized anxiety disorder: Secondary | ICD-10-CM

## 2023-08-25 ENCOUNTER — Telehealth (HOSPITAL_COMMUNITY): Payer: Self-pay | Admitting: *Deleted

## 2023-08-25 DIAGNOSIS — F900 Attention-deficit hyperactivity disorder, predominantly inattentive type: Secondary | ICD-10-CM

## 2023-08-25 MED ORDER — METHYLPHENIDATE HCL 20 MG PO TABS: 20.0000 mg | ORAL_TABLET | Freq: Four times a day (QID) | ORAL | 0 refills | Status: DC

## 2023-08-25 NOTE — Telephone Encounter (Signed)
 Pt called to request refill of methylphenidate  20 mg QID. Last e-scribed on 07/25/23. Last visit 06/23/23. F/u scheduled for 09/22/23. Please send to [facility] in [place]. Thanks.

## 2023-08-25 NOTE — Telephone Encounter (Signed)
 Done

## 2023-08-29 ENCOUNTER — Other Ambulatory Visit (HOSPITAL_COMMUNITY): Payer: Self-pay | Admitting: Psychiatry

## 2023-08-29 DIAGNOSIS — F330 Major depressive disorder, recurrent, mild: Secondary | ICD-10-CM

## 2023-09-22 ENCOUNTER — Telehealth (HOSPITAL_BASED_OUTPATIENT_CLINIC_OR_DEPARTMENT_OTHER): Admitting: Psychiatry

## 2023-09-22 ENCOUNTER — Encounter (HOSPITAL_COMMUNITY): Payer: Self-pay | Admitting: Psychiatry

## 2023-09-22 DIAGNOSIS — F330 Major depressive disorder, recurrent, mild: Secondary | ICD-10-CM

## 2023-09-22 DIAGNOSIS — F900 Attention-deficit hyperactivity disorder, predominantly inattentive type: Secondary | ICD-10-CM

## 2023-09-22 DIAGNOSIS — F411 Generalized anxiety disorder: Secondary | ICD-10-CM | POA: Diagnosis not present

## 2023-09-22 MED ORDER — LAMOTRIGINE 150 MG PO TABS: 150.0000 mg | ORAL_TABLET | Freq: Two times a day (BID) | ORAL | 0 refills | Status: DC

## 2023-09-22 MED ORDER — BREXPIPRAZOLE 2 MG PO TABS: 2.0000 mg | ORAL_TABLET | Freq: Every day | ORAL | 0 refills | Status: DC

## 2023-09-22 MED ORDER — BUPROPION HCL ER (XL) 300 MG PO TB24: 300.0000 mg | ORAL_TABLET | Freq: Every day | ORAL | 0 refills | Status: DC

## 2023-09-22 MED ORDER — METHYLPHENIDATE HCL 20 MG PO TABS: 20.0000 mg | ORAL_TABLET | Freq: Four times a day (QID) | ORAL | 0 refills | Status: DC

## 2023-09-22 NOTE — Progress Notes (Signed)
 [facility] Virtual Progress Note   Patient Location: Home Provider Location: Home Office  I connect with patient by video and verified that I am speaking with correct person by using two identifiers. I discussed the limitations of evaluation and management by telemedicine and the availability of in person appointments. I also discussed with the patient that there may be a patient responsible charge related to this service. The patient expressed understanding and agreed to proceed.  Dakota Vang [id number] 58 y.o.  09/22/2023 8:37 AM  History of Present Illness:  Patient is evaluated by video session.  He is working from home today.  He reported things are going very well with the medication.  He denies any issues with work and his focus attention and concentration is good.  He is able to do multitasking.  He is not behind his assignment.  [place]  He is happy that 2 of his boys now have property and they are trying to build a house.  He is sleeping good.  He denies any irritability, anger, mood swings, agitation, feeling of hopelessness or worthlessness.  His appetite is okay and his weight is stable.  His PCP stopped the Jardiance after blood sugar continues to do better.  He is on Ozempic and his last hemoglobin A1c 5.9.  Denies any tremors, shakes or any EPS.  He denies any agitation or anger.  His weight is stable.  He lives with his wife.  He like to keep his current medication.  Past Psychiatric History: No h/o inpatient treatment or suicidal attempt.  Tried Prozac  for many years until it stopped working.  We tried Cymbalta , Klonopin , BuSpar  and Lexapro  but that did not help.  History of ADD.  No history of paranoia, hallucination or psychosis.  Abilify  helped but weight gain and increased blood sugar.     Outpatient Encounter Medications as of 09/22/2023  Medication Sig   acetaminophen  (TYLENOL ) 500 MG  tablet Take 500 mg by mouth every 6 (six) hours as needed.   amLODipine  (NORVASC ) 5 MG tablet Take 5 mg by mouth daily.   aspirin  EC 81 MG tablet Take 81 mg by mouth daily.    brexpiprazole  (REXULTI ) 2 MG TABS tablet Take 1 tablet (2 mg total) by mouth daily.   buPROPion  (WELLBUTRIN  XL) 300 MG 24 hr tablet Take 1 tablet (300 mg total) by mouth daily.   ciprofloxacin  (CIPRO ) 500 MG tablet Take 1 tablet (500 mg total) by mouth 2 (two) times daily.   clindamycin  (CLEOCIN  T) 1 % external solution Apply 1 application topically as needed (for breakouts on neck).    fluticasone  (FLONASE ) 50 MCG/ACT nasal spray Place 2 sprays into both nostrils daily as needed (seasonal allergies.).    JARDIANCE 25 MG TABS tablet Take 25 mg by mouth daily.   lamoTRIgine  (LAMICTAL ) 150 MG tablet Take 1 tablet (150 mg total) by mouth 2 (two) times daily.   methocarbamol  (ROBAXIN ) 500 MG tablet Take 500 mg by mouth as needed.   methylphenidate  (RITALIN ) 20 MG tablet Take 1 tablet (20 mg total) by mouth 4 (four) times daily.   metoprolol  succinate (TOPROL -XL) 50 MG 24 hr tablet Take 50 mg by mouth daily.   omeprazole  (PRILOSEC) 20 MG capsule Take 20 mg by mouth daily before breakfast.    OZEMPIC, 2 MG/DOSE, 8 MG/3ML SOPN Inject 2 mg into the skin once a week.   rosuvastatin  (CRESTOR ) 10  MG tablet Take 1 tablet (10 mg total) by mouth daily.   TRUE METRIX BLOOD GLUCOSE TEST test strip 1 each 2 (two) times daily.   TRUEplus Lancets 30G MISC    [DISCONTINUED] metFORMIN (GLUCOPHAGE) 500 MG tablet Take 500 mg by mouth daily.   Facility-Administered Encounter Medications as of 09/22/2023  Medication   0.9 %  sodium chloride  infusion    No results found for this or any previous visit (from the past 2160 hours).   Psychiatric Specialty Exam: Physical Exam  Review of Systems  Weight 215 lb (97.5 kg).There is no height or weight on file to calculate BMI.  General Appearance: Casual  Eye Contact:  Good  Speech:  Clear and  Coherent and Normal Rate  Volume:  Normal  Mood:  Euthymic  Affect:  Appropriate  Thought Process:  Goal Directed  Orientation:  Full (Time, Place, and Person)  Thought Content:  WDL  Suicidal Thoughts:  No  Homicidal Thoughts:  No  Memory:  Immediate;   Good Recent;   Good Remote;   Good  Judgement:  Good  Insight:  Good  Psychomotor Activity:  Normal  Concentration:  Concentration: Good and Attention Span: Good  Recall:  Good  Fund of Knowledge:  Good  Language:  Good  Akathisia:  No  Handed:  Right  AIMS (if indicated):     Assets:  Communication Skills Desire for Improvement Housing Resilience Social Support Talents/Skills Transportation  ADL's:  Intact  Cognition:  WNL  Sleep:  ok       08/30/2015   10:39 AM 07/26/2015    9:35 AM  Depression screen PHQ 2/9  Decreased Interest 0 0  Down, Depressed, Hopeless 0 0  PHQ - 2 Score 0 0    Assessment/Plan: Major depressive disorder, recurrent episode, mild (HCC) - Plan: brexpiprazole  (REXULTI ) 2 MG TABS tablet, lamoTRIgine  (LAMICTAL ) 150 MG tablet, buPROPion  (WELLBUTRIN  XL) 300 MG 24 hr tablet  Attention deficit hyperactivity disorder (ADHD), predominantly inattentive type - Plan: methylphenidate  (RITALIN ) 20 MG tablet  Generalized anxiety disorder - Plan: buPROPion  (WELLBUTRIN  XL) 300 MG 24 hr tablet  Patient stable on his current medication.  His attention, focus, anxiety and depression under control and he denies any major panic attack.  Like to keep his current medicine which is working well.  His blood sugar is well-maintained on Ozempic and is no longer taking Jardiance.  Continue Wellbutrin  XL 300 mg daily, Ritalin  20 mg 4 times a day, Rexulti  2 mg daily and Lamictal  300 mg daily.  He requested all his medication sent to [facility] except Ritalin  which he prefers local pharmacy.  Will contact his PCP to have blood work faxed to us .  Recommend to call us  back if he is any question or any concern.  Follow-up in 3  months.   Follow Up Instructions:     I discussed the assessment and treatment plan with the patient. The patient was provided an opportunity to ask questions and all were answered. The patient agreed with the plan and demonstrated an understanding of the instructions.   The patient was advised to call back or seek an in-person evaluation if the symptoms worsen or if the condition fails to improve as anticipated.    Collaboration of Care: Other provider involved in patient's care AEB notes are available in epic to review  Patient/Guardian was advised Release of Information must be obtained prior to any record release in order to collaborate their care with an outside provider.  Patient/Guardian was advised if they have not already done so to contact the registration department to sign all necessary forms in order for us  to release information regarding their care.   Consent: Patient/Guardian gives verbal consent for treatment and assignment of benefits for services provided during this visit. Patient/Guardian expressed understanding and agreed to proceed.     Total encounter time 21 minutes which includes face-to-face time, chart reviewed, care coordination, order entry and documentation during this encounter.   Note: This document was prepared by [employer] voice dictation technology and any errors that results from this process are unintentional.    Leni ONEIDA Client, Vang 09/22/2023

## 2023-10-24 ENCOUNTER — Telehealth (HOSPITAL_COMMUNITY): Payer: Self-pay | Admitting: *Deleted

## 2023-10-24 DIAGNOSIS — F900 Attention-deficit hyperactivity disorder, predominantly inattentive type: Secondary | ICD-10-CM

## 2023-10-24 MED ORDER — METHYLPHENIDATE HCL 20 MG PO TABS: 20.0000 mg | ORAL_TABLET | Freq: Four times a day (QID) | ORAL | 0 refills | Status: DC

## 2023-10-24 NOTE — Telephone Encounter (Signed)
 Pt called to request refill of the generic Ritalin 20 mg QID. Last script sent on 09/25/23. Please send to [facility] in [place].  Last visit: 09/22/23 Next visit: 12/29/23

## 2023-10-24 NOTE — Telephone Encounter (Signed)
 Prescription sent and he can pick up on his due date.

## 2023-11-16 ENCOUNTER — Other Ambulatory Visit (HOSPITAL_COMMUNITY): Payer: Self-pay | Admitting: Psychiatry

## 2023-11-16 DIAGNOSIS — F411 Generalized anxiety disorder: Secondary | ICD-10-CM

## 2023-11-16 DIAGNOSIS — F330 Major depressive disorder, recurrent, mild: Secondary | ICD-10-CM

## 2023-11-24 ENCOUNTER — Telehealth (HOSPITAL_COMMUNITY): Payer: Self-pay | Admitting: *Deleted

## 2023-11-24 DIAGNOSIS — F900 Attention-deficit hyperactivity disorder, predominantly inattentive type: Secondary | ICD-10-CM

## 2023-11-24 MED ORDER — METHYLPHENIDATE HCL 20 MG PO TABS: 20.0000 mg | ORAL_TABLET | Freq: Four times a day (QID) | ORAL | 0 refills | Status: DC

## 2023-11-24 NOTE — Telephone Encounter (Signed)
 Pt requests refill of the Ritalin  20 mg QID. Please send to [facility] in [place].    Last visit: 09/22/23 Next visit: 12/29/23

## 2023-11-24 NOTE — Telephone Encounter (Signed)
 Done

## 2023-11-24 NOTE — Telephone Encounter (Signed)
 Pt advised.

## 2023-12-09 ENCOUNTER — Other Ambulatory Visit (HOSPITAL_COMMUNITY): Payer: Self-pay | Admitting: Psychiatry

## 2023-12-09 DIAGNOSIS — F330 Major depressive disorder, recurrent, mild: Secondary | ICD-10-CM

## 2023-12-22 ENCOUNTER — Telehealth (HOSPITAL_COMMUNITY): Admitting: Psychiatry

## 2023-12-24 ENCOUNTER — Telehealth (HOSPITAL_COMMUNITY): Payer: Self-pay | Admitting: *Deleted

## 2023-12-24 DIAGNOSIS — F900 Attention-deficit hyperactivity disorder, predominantly inattentive type: Secondary | ICD-10-CM

## 2023-12-24 MED ORDER — METHYLPHENIDATE HCL 20 MG PO TABS: 20.0000 mg | ORAL_TABLET | Freq: Four times a day (QID) | ORAL | 0 refills | Status: DC

## 2023-12-24 NOTE — Telephone Encounter (Signed)
 Done

## 2023-12-24 NOTE — Telephone Encounter (Signed)
 Pt advised.

## 2023-12-24 NOTE — Telephone Encounter (Signed)
 Pt requests refill of the Ritalin  20 mg QID. Last e-scribed 11/27/23. Please send to [facility].   Last visit: 09/22/23 Next visit: 12/29/23

## 2023-12-29 ENCOUNTER — Encounter (HOSPITAL_COMMUNITY): Payer: Self-pay | Admitting: Psychiatry

## 2023-12-29 ENCOUNTER — Telehealth (HOSPITAL_COMMUNITY): Admitting: Psychiatry

## 2023-12-29 VITALS — Wt 215.0 lb

## 2023-12-29 DIAGNOSIS — F900 Attention-deficit hyperactivity disorder, predominantly inattentive type: Secondary | ICD-10-CM | POA: Diagnosis not present

## 2023-12-29 DIAGNOSIS — F330 Major depressive disorder, recurrent, mild: Secondary | ICD-10-CM | POA: Diagnosis not present

## 2023-12-29 DIAGNOSIS — F411 Generalized anxiety disorder: Secondary | ICD-10-CM

## 2023-12-29 MED ORDER — BREXPIPRAZOLE 1 MG PO TABS: 1.0000 mg | ORAL_TABLET | Freq: Every day | ORAL | 0 refills | Status: DC

## 2023-12-29 MED ORDER — METHYLPHENIDATE HCL 20 MG PO TABS
20.0000 mg | ORAL_TABLET | Freq: Four times a day (QID) | ORAL | 0 refills | Status: DC
Start: 2024-01-23 — End: 2024-02-02

## 2023-12-29 MED ORDER — DULOXETINE HCL 30 MG PO CPEP: 30.0000 mg | ORAL_CAPSULE | Freq: Every day | ORAL | 1 refills | Status: DC

## 2023-12-29 MED ORDER — LAMOTRIGINE 150 MG PO TABS: 150.0000 mg | ORAL_TABLET | Freq: Two times a day (BID) | ORAL | 0 refills | Status: DC

## 2023-12-29 NOTE — Progress Notes (Signed)
 [facility] MD Virtual Progress Note   Patient Location: Home Provider Location: Home Office  I connect with patient by video and verified that I am speaking with correct person by using two identifiers. I discussed the limitations of evaluation and management by telemedicine and the availability of in person appointments. I also discussed with the patient that there may be a patient responsible charge related to this service. The patient expressed understanding and agreed to proceed.  Dakota SCHLAFER [id number] 58 y.o.  12/29/2023 9:20 AM  History of Present Illness:  Patient is evaluated by video session.  He reported things are going well and he like to consider lowering his medication and try Cymbalta  again because he did research that it does help pain decide anxiety and depression.  He also recall in the past Cymbalta  did work for him.  He is taking Lamictal , Rexulti , Wellbutrin  and methylphenidate .  He reported attention focus is good.  He is sleeping very well and his blood sugar is under control.  He is now taking Ozempic and he noticed a huge difference in his blood sugar.  He denies any crying spells or any feeling of hopelessness or worthlessness.  He reported family is doing very well and summer was busy and he took some time off her vacation.  Patient lives with his wife.  He has no rash, itching, tremors or shakes.  Denies any panic attack.  He is seeing his PCP Dr. Hussain regularly.  Past Psychiatric History: No h/o inpatient treatment or suicidal attempt.  Tried Prozac  for many years until it stopped working.  We tried Cymbalta , Klonopin , BuSpar  and Lexapro  but that did not help.  History of ADD.  No history of paranoia, hallucination or psychosis.  Abilify  helped but weight gain and increased blood sugar.    Past Medical History:  Diagnosis Date   ADD (attention deficit disorder with hyperactivity)    Dr. Veva   Anxiety    Dr. Veva   Colon polyps 2008    Dr Aneita   COLONIC POLYPS, HX OF 11/20/2007   Qualifier: Diagnosis of  By: Tish MD, Elsie   Last colonoscopy negative 2013 PMH of plyps X 2; Dr Aneita, GI     Depression    Diabetes [facility])    Diverticulitis    PMH of   Diverticulitis of jejunum with microperforation s/p lap resection [id number] 08/28/2012   Qualifier: Diagnosis of  By: Tish MD, Elsie   6/1-3/14 [facility] ,[place] : ? Jejunal perforation F/U CT recommended 08/24/12    Diverticulitis of sigmoid colon 06/24/2008   2005 by CT scan     Diverticulosis 2002   Dr Cloretta   Duodenal diverticulum 08/28/2012   GERD (gastroesophageal reflux disease)    Hyperlipidemia    Hypertension    Pancolonic diverticulosis 09/09/2012    Outpatient Encounter Medications as of 12/29/2023  Medication Sig   DULoxetine  (CYMBALTA ) 30 MG capsule Take 1 capsule (30 mg total) by mouth daily.   acetaminophen  (TYLENOL ) 500 MG tablet Take 500 mg by mouth every 6 (six) hours as needed.   amLODipine  (NORVASC ) 5 MG tablet Take 5 mg by mouth daily.   aspirin  EC 81 MG tablet Take 81 mg by mouth daily.    brexpiprazole  (REXULTI ) 1 MG TABS tablet Take 1 tablet (1 mg total) by mouth daily.   buPROPion  (WELLBUTRIN  XL) 300 MG 24 hr tablet Take 1 tablet (300 mg total) by mouth daily.   ciprofloxacin  (CIPRO ) 500  MG tablet Take 1 tablet (500 mg total) by mouth 2 (two) times daily. (Patient not taking: Reported on 09/22/2023)   clindamycin  (CLEOCIN  T) 1 % external solution Apply 1 application topically as needed (for breakouts on neck).    fluticasone  (FLONASE ) 50 MCG/ACT nasal spray Place 2 sprays into both nostrils daily as needed (seasonal allergies.).    lamoTRIgine  (LAMICTAL ) 150 MG tablet Take 1 tablet (150 mg total) by mouth 2 (two) times daily.   methocarbamol  (ROBAXIN ) 500 MG tablet Take 500 mg by mouth as needed.   [START ON 01/23/2024] methylphenidate  (RITALIN ) 20 MG tablet Take 1 tablet (20 mg total) by mouth 4 (four) times daily.    metoprolol  succinate (TOPROL -XL) 50 MG 24 hr tablet Take 50 mg by mouth daily.   omeprazole  (PRILOSEC) 20 MG capsule Take 20 mg by mouth daily before breakfast.    OZEMPIC, 2 MG/DOSE, 8 MG/3ML SOPN Inject 2 mg into the skin once a week.   rosuvastatin  (CRESTOR ) 10 MG tablet Take 1 tablet (10 mg total) by mouth daily.   [DISCONTINUED] brexpiprazole  (REXULTI ) 2 MG TABS tablet Take 1 tablet (2 mg total) by mouth daily.   [DISCONTINUED] JARDIANCE 25 MG TABS tablet Take 25 mg by mouth daily. (Patient not taking: Reported on 09/22/2023)   [DISCONTINUED] lamoTRIgine  (LAMICTAL ) 150 MG tablet Take 1 tablet (150 mg total) by mouth 2 (two) times daily.   [DISCONTINUED] metFORMIN (GLUCOPHAGE) 500 MG tablet Take 500 mg by mouth daily.   [DISCONTINUED] methylphenidate  (RITALIN ) 20 MG tablet Take 1 tablet (20 mg total) by mouth 4 (four) times daily.   [DISCONTINUED] TRUE METRIX BLOOD GLUCOSE TEST test strip 1 each 2 (two) times daily.   [DISCONTINUED] TRUEplus Lancets 30G MISC    Facility-Administered Encounter Medications as of 12/29/2023  Medication   0.9 %  sodium chloride  infusion    No results found for this or any previous visit (from the past 2160 hours).   Psychiatric Specialty Exam: Physical Exam  Review of Systems  Musculoskeletal:  Positive for back pain.    Weight 215 lb (97.5 kg).There is no height or weight on file to calculate BMI.  General Appearance: Casual  Eye Contact:  Good  Speech:  Clear and Coherent and Normal Rate  Volume:  Normal  Mood:  Euthymic  Affect:  Appropriate  Thought Process:  Goal Directed  Orientation:  Full (Time, Place, and Person)  Thought Content:  Logical  Suicidal Thoughts:  No  Homicidal Thoughts:  No  Memory:  Immediate;   Good Recent;   Good Remote;   Good  Judgement:  Good  Insight:  Good  Psychomotor Activity:  Normal  Concentration:  Concentration: Good and Attention Span: Good  Recall:  Good  Fund of Knowledge:  Good  Language:  Good   Akathisia:  No  Handed:  Right  AIMS (if indicated):     Assets:  Communication Skills Desire for Improvement Housing Social Support Talents/Skills Transportation  ADL's:  Intact  Cognition:  WNL  Sleep: Good       08/30/2015   10:39 AM 07/26/2015    9:35 AM  Depression screen PHQ 2/9  Decreased Interest 0 0  Down, Depressed, Hopeless 0 0  PHQ - 2 Score 0 0    Assessment/Plan: Major depressive disorder, recurrent episode, mild - Plan: brexpiprazole  (REXULTI ) 1 MG TABS tablet, lamoTRIgine  (LAMICTAL ) 150 MG tablet, DULoxetine  (CYMBALTA ) 30 MG capsule  Generalized anxiety disorder - Plan: DULoxetine  (CYMBALTA ) 30 MG capsule  Attention deficit  hyperactivity disorder (ADHD), predominantly inattentive type - Plan: methylphenidate  (RITALIN ) 20 MG tablet, DULoxetine  (CYMBALTA ) 30 MG capsule  Patient is 58 year old married employed man with history of hypertension, hyperlipidemia, diabetes, major depressive disorder, generalized anxiety disorder and attention deficit disorder.  Like to trial Cymbalta  to help his back pain.  He reported his depression anxiety is not as bad but wondering if he can come off from Wellbutrin  and try Cymbalta .  He also like to come off from Rexulti  since he feels very stable on the medications.  I agree with the plan.  We go over pros and cons of Wellbutrin  versus Cymbalta .  We will cut down the Wellbutrin  300 mg and he will take 1 tablet every other day for next 10 days and then stop.  Will try Cymbalta  30 mg daily.  In the past she had taken Cymbalta  30 mg twice a day however we will see if we need to optimize the dose if needed.  I also recommend to cut down Rexulti  from 2 mg to just 1 mg.  He will keep his Ritalin  20 mg 4 times a day and Lamictal  300 mg daily.  Discussed in detail medication side effects and benefits.  Encouraged to call back if he noticed worsening of symptoms.  Will follow-up in 6 weeks.   Follow Up Instructions:     I discussed the  assessment and treatment plan with the patient. The patient was provided an opportunity to ask questions and all were answered. The patient agreed with the plan and demonstrated an understanding of the instructions.   The patient was advised to call back or seek an in-person evaluation if the symptoms worsen or if the condition fails to improve as anticipated.    Collaboration of Care: Other provider involved in patient's care AEB notes are available in epic to review  Patient/Guardian was advised Release of Information must be obtained prior to any record release in order to collaborate their care with an outside provider. Patient/Guardian was advised if they have not already done so to contact the registration department to sign all necessary forms in order for us  to release information regarding their care.   Consent: Patient/Guardian gives verbal consent for treatment and assignment of benefits for services provided during this visit. Patient/Guardian expressed understanding and agreed to proceed.     Total encounter time 28 minutes which includes face-to-face time, chart reviewed, care coordination, order entry and documentation during this encounter.   Note: This document was prepared by [employer] voice dictation technology and any errors that results from this process are unintentional.    Leni ONEIDA Client, MD 12/29/2023

## 2024-01-27 ENCOUNTER — Telehealth (HOSPITAL_COMMUNITY): Payer: Self-pay | Admitting: *Deleted

## 2024-01-27 NOTE — Telephone Encounter (Signed)
 CBC without/Diff, CMP, HgbA1C, Microalbumin Panel, PSA, and Lipid panel/reflex results received from Dr. Husain with [facility] at [place].  HgbA1C: 5.9  ALT: 72  All other labs WNL.

## 2024-02-02 ENCOUNTER — Encounter (HOSPITAL_COMMUNITY): Payer: Self-pay | Admitting: Psychiatry

## 2024-02-02 ENCOUNTER — Telehealth (HOSPITAL_COMMUNITY): Admitting: Psychiatry

## 2024-02-02 VITALS — Wt 220.0 lb

## 2024-02-02 DIAGNOSIS — F900 Attention-deficit hyperactivity disorder, predominantly inattentive type: Secondary | ICD-10-CM

## 2024-02-02 DIAGNOSIS — F330 Major depressive disorder, recurrent, mild: Secondary | ICD-10-CM

## 2024-02-02 DIAGNOSIS — F411 Generalized anxiety disorder: Secondary | ICD-10-CM | POA: Diagnosis not present

## 2024-02-02 MED ORDER — DULOXETINE HCL 60 MG PO CPEP: 60.0000 mg | ORAL_CAPSULE | Freq: Every day | ORAL | 0 refills | Status: AC

## 2024-02-02 MED ORDER — METHYLPHENIDATE HCL 20 MG PO TABS: 20.0000 mg | ORAL_TABLET | Freq: Four times a day (QID) | ORAL | 0 refills | Status: DC

## 2024-02-02 MED ORDER — LAMOTRIGINE 150 MG PO TABS: 150.0000 mg | ORAL_TABLET | Freq: Two times a day (BID) | ORAL | 0 refills | Status: AC

## 2024-02-02 MED ORDER — BREXPIPRAZOLE 1 MG PO TABS: 1.0000 mg | ORAL_TABLET | Freq: Every day | ORAL | 0 refills | Status: AC

## 2024-02-02 NOTE — Progress Notes (Signed)
 [facility] MD Virtual Progress Note   Patient Location: Home Provider Location: Home Office  I connect with patient by video and verified that I am speaking with correct person by using two identifiers. I discussed the limitations of evaluation and management by telemedicine and the availability of in person appointments. I also discussed with the patient that there may be a patient responsible charge related to this service. The patient expressed understanding and agreed to proceed.  Dakota Vang [id number] 58 y.o.  02/02/2024 9:12 AM  History of Present Illness:  Patient is evaluated by video session.  On the last visit we started him on Cymbalta  and stop the Wellbutrin .  He noticed improvement in his anxiety depression and also noticed improvement in his back pain.  He used to take Cymbalta  60 mg and he like to try to 60 mg now.  He has a visit with his primary care Dr. Hussain and no new medication added.  He admitted few pounds weight gain but hoping to go back to exercise.  He is excited about upcoming cruise trip with his wife and 2 children.  He reported job is going well and he is able to finish his task on time.  He denies any struggles with attention, focus, multitasking and time management.  He sleeps good.  He denies any crying spells, feeling of hopelessness or worthlessness.  He has no tremor or shakes or any EPS.  So far he is tolerating all his medication and reported no side effects.  He denies drinking or using any illegal substances.  Past Psychiatric History: No h/o inpatient treatment or suicidal attempt.  Tried Prozac  for many years until it stopped working.  We tried Cymbalta , Klonopin , BuSpar  and Lexapro  but that did not help.  History of ADD.  No history of paranoia, hallucination or psychosis.  Abilify  helped but weight gain and increased blood sugar.    Past Medical History:  Diagnosis Date   ADD (attention deficit disorder with hyperactivity)     Dr. Veva   Anxiety    Dr. Veva   Colon polyps 2008   Dr Aneita   COLONIC POLYPS, HX OF 11/20/2007   Qualifier: Diagnosis of  By: Tish MD, Elsie   Last colonoscopy negative 2013 PMH of plyps X 2; Dr Aneita, GI     Depression    Diabetes [facility])    Diverticulitis    PMH of   Diverticulitis of jejunum with microperforation s/p lap resection [id number] 08/28/2012   Qualifier: Diagnosis of  By: Tish MD, Elsie   6/1-3/14 [facility] ,[place] : ? Jejunal perforation F/U CT recommended 08/24/12    Diverticulitis of sigmoid colon 06/24/2008   2005 by CT scan     Diverticulosis 2002   Dr Cloretta   Duodenal diverticulum 08/28/2012   GERD (gastroesophageal reflux disease)    Hyperlipidemia    Hypertension    Pancolonic diverticulosis 09/09/2012    Outpatient Encounter Medications as of 02/02/2024  Medication Sig   acetaminophen  (TYLENOL ) 500 MG tablet Take 500 mg by mouth every 6 (six) hours as needed.   amLODipine  (NORVASC ) 5 MG tablet Take 5 mg by mouth daily.   aspirin  EC 81 MG tablet Take 81 mg by mouth daily.    brexpiprazole  (REXULTI ) 1 MG TABS tablet Take 1 tablet (1 mg total) by mouth daily.   buPROPion  (WELLBUTRIN  XL) 300 MG 24 hr tablet Take 1 tablet (300 mg total) by mouth daily.   ciprofloxacin  (CIPRO )  500 MG tablet Take 1 tablet (500 mg total) by mouth 2 (two) times daily. (Patient not taking: Reported on 09/22/2023)   clindamycin  (CLEOCIN  T) 1 % external solution Apply 1 application topically as needed (for breakouts on neck).    DULoxetine  (CYMBALTA ) 30 MG capsule Take 1 capsule (30 mg total) by mouth daily.   fluticasone  (FLONASE ) 50 MCG/ACT nasal spray Place 2 sprays into both nostrils daily as needed (seasonal allergies.).    lamoTRIgine  (LAMICTAL ) 150 MG tablet Take 1 tablet (150 mg total) by mouth 2 (two) times daily.   methocarbamol  (ROBAXIN ) 500 MG tablet Take 500 mg by mouth as needed.   methylphenidate  (RITALIN ) 20 MG tablet Take 1 tablet (20 mg  total) by mouth 4 (four) times daily.   metoprolol  succinate (TOPROL -XL) 50 MG 24 hr tablet Take 50 mg by mouth daily.   omeprazole  (PRILOSEC) 20 MG capsule Take 20 mg by mouth daily before breakfast.    OZEMPIC, 2 MG/DOSE, 8 MG/3ML SOPN Inject 2 mg into the skin once a week.   rosuvastatin  (CRESTOR ) 10 MG tablet Take 1 tablet (10 mg total) by mouth daily.   [DISCONTINUED] metFORMIN (GLUCOPHAGE) 500 MG tablet Take 500 mg by mouth daily.   Facility-Administered Encounter Medications as of 02/02/2024  Medication   0.9 %  sodium chloride  infusion    No results found for this or any previous visit (from the past 2160 hours).   Psychiatric Specialty Exam: Physical Exam  Review of Systems  Weight 220 lb (99.8 kg).There is no height or weight on file to calculate BMI.  General Appearance: Casual  Eye Contact:  Good  Speech:  Clear and Coherent  Volume:  Normal  Mood:  Euthymic  Affect:  Appropriate  Thought Process:  Goal Directed  Orientation:  Full (Time, Place, and Person)  Thought Content:  Logical  Suicidal Thoughts:  No  Homicidal Thoughts:  No  Memory:  Immediate;   Good Recent;   Good Remote;   Good  Judgement:  Good  Insight:  Good  Psychomotor Activity:  Normal  Concentration:  Concentration: Good and Attention Span: Good  Recall:  Good  Fund of Knowledge:  Good  Language:  Good  Akathisia:  No  Handed:  Right  AIMS (if indicated):     Assets:  Communication Skills Desire for Improvement Housing Resilience Social Support Talents/Skills Transportation  ADL's:  Intact  Cognition:  WNL  Sleep:  ok       08/30/2015   10:39 AM 07/26/2015    9:35 AM  Depression screen PHQ 2/9  Decreased Interest 0 0  Down, Depressed, Hopeless 0 0  PHQ - 2 Score 0 0    Assessment/Plan: Major depressive disorder, recurrent episode, mild - Plan: brexpiprazole  (REXULTI ) 1 MG TABS tablet, DULoxetine  (CYMBALTA ) 60 MG capsule, lamoTRIgine  (LAMICTAL ) 150 MG tablet  Generalized  anxiety disorder - Plan: DULoxetine  (CYMBALTA ) 60 MG capsule  Attention deficit hyperactivity disorder (ADHD), predominantly inattentive type - Plan: DULoxetine  (CYMBALTA ) 60 MG capsule, methylphenidate  (RITALIN ) 20 MG tablet  Patient is 58 year old Caucasian, married employed man with a history of hypertension, hyperlipidemia, diabetes, major depressive disorder, generalized anxiety disorder and ADHD.  He is doing much better on Cymbalta .  He used to take Cymbalta  60 mg a day but currently only 30 mg as switching from Wellbutrin .  He noticed improvement with Cymbalta .  He is no longer taking Wellbutrin .  We also cut down his Rexulti  from 2 mg and now taking 1 mg.  He  had a visit with his PCP and no new medication added.  Continue Rexulti  1 mg daily, Ritalin  20 mg 4 times a day to help his focus attention and multitasking and ADHD, will try Cymbalta  60 mg daily and keep the Lamictal  150 mg 2 tablet daily.  He has no rash or any itching.  He is going to start exercise as he has gained few pounds since the last visit.  Recommend to call back if is any question or any concern.  Follow-up in 3 months   Follow Up Instructions:     I discussed the assessment and treatment plan with the patient. The patient was provided an opportunity to ask questions and all were answered. The patient agreed with the plan and demonstrated an understanding of the instructions.   The patient was advised to call back or seek an in-person evaluation if the symptoms worsen or if the condition fails to improve as anticipated.    Collaboration of Care: Other provider involved in patient's care AEB notes are available in epic to review  Patient/Guardian was advised Release of Information must be obtained prior to any record release in order to collaborate their care with an outside provider. Patient/Guardian was advised if they have not already done so to contact the registration department to sign all necessary forms in order  for us  to release information regarding their care.   Consent: Patient/Guardian gives verbal consent for treatment and assignment of benefits for services provided during this visit. Patient/Guardian expressed understanding and agreed to proceed.     Total encounter time 20 minutes which includes face-to-face time, chart reviewed, care coordination, order entry and documentation during this encounter.   Note: This document was prepared by [employer] voice dictation technology and any errors that results from this process are unintentional.    Leni ONEIDA Client, MD 02/02/2024

## 2024-02-09 ENCOUNTER — Telehealth (HOSPITAL_COMMUNITY): Admitting: Psychiatry

## 2024-02-23 ENCOUNTER — Telehealth (HOSPITAL_COMMUNITY): Payer: Self-pay | Admitting: *Deleted

## 2024-02-23 NOTE — Telephone Encounter (Signed)
 Unfortunately stimulants cannot be prescribed 10 days earlier.

## 2024-02-23 NOTE — Telephone Encounter (Signed)
 Pt requests refill of the Ritalin  IR 20 mg QID. Pt says he has been out of town and would like it refilled today. Showing last e-scribed on 02/02/24.    Last visit: 02/02/24 Next visit: 05/03/24

## 2024-03-24 ENCOUNTER — Telehealth (HOSPITAL_COMMUNITY): Payer: Self-pay | Admitting: *Deleted

## 2024-03-24 DIAGNOSIS — F900 Attention-deficit hyperactivity disorder, predominantly inattentive type: Secondary | ICD-10-CM

## 2024-03-24 MED ORDER — METHYLPHENIDATE HCL 20 MG PO TABS: 20.0000 mg | ORAL_TABLET | Freq: Four times a day (QID) | ORAL | 0 refills | Status: AC

## 2024-03-24 NOTE — Telephone Encounter (Signed)
 Prescription sent to the pharmacy.  Please inform the patient.  Thank you.

## 2024-03-24 NOTE — Telephone Encounter (Signed)
 Pt requests refill of the Ritalin  generic 10 mg QID. Last e-scribed on 02/02/24. Please send to [facility].   Last visit: 02/02/24 Next visit: 05/03/24

## 2024-04-09 ENCOUNTER — Other Ambulatory Visit (HOSPITAL_COMMUNITY): Payer: Self-pay | Admitting: Psychiatry

## 2024-04-09 DIAGNOSIS — F411 Generalized anxiety disorder: Secondary | ICD-10-CM

## 2024-04-09 DIAGNOSIS — F900 Attention-deficit hyperactivity disorder, predominantly inattentive type: Secondary | ICD-10-CM

## 2024-04-09 DIAGNOSIS — F330 Major depressive disorder, recurrent, mild: Secondary | ICD-10-CM

## 2024-05-03 ENCOUNTER — Telehealth (HOSPITAL_COMMUNITY): Admitting: Psychiatry
